# Patient Record
Sex: Male | Born: 1945 | Race: White | Hispanic: No | Marital: Married | Smoking: Former smoker
Health system: Southern US, Community
[De-identification: ages and names within clinical notes are randomized; demographics above are authoritative.]

## PROBLEM LIST (undated history)

## (undated) DIAGNOSIS — E785 Hyperlipidemia, unspecified: Secondary | ICD-10-CM

## (undated) DIAGNOSIS — F4310 Post-traumatic stress disorder, unspecified: Secondary | ICD-10-CM

## (undated) DIAGNOSIS — F419 Anxiety disorder, unspecified: Secondary | ICD-10-CM

## (undated) DIAGNOSIS — R7303 Prediabetes: Secondary | ICD-10-CM

## (undated) DIAGNOSIS — G4730 Sleep apnea, unspecified: Secondary | ICD-10-CM

## (undated) DIAGNOSIS — D126 Benign neoplasm of colon, unspecified: Secondary | ICD-10-CM

## (undated) DIAGNOSIS — F32A Depression, unspecified: Secondary | ICD-10-CM

## (undated) DIAGNOSIS — I10 Essential (primary) hypertension: Secondary | ICD-10-CM

## (undated) DIAGNOSIS — M1990 Unspecified osteoarthritis, unspecified site: Secondary | ICD-10-CM

## (undated) DIAGNOSIS — J449 Chronic obstructive pulmonary disease, unspecified: Secondary | ICD-10-CM

## (undated) DIAGNOSIS — E559 Vitamin D deficiency, unspecified: Secondary | ICD-10-CM

## (undated) DIAGNOSIS — G250 Essential tremor: Secondary | ICD-10-CM

## (undated) DIAGNOSIS — C61 Malignant neoplasm of prostate: Secondary | ICD-10-CM

## (undated) DIAGNOSIS — R519 Headache, unspecified: Secondary | ICD-10-CM

## (undated) DIAGNOSIS — K219 Gastro-esophageal reflux disease without esophagitis: Secondary | ICD-10-CM

## (undated) DIAGNOSIS — Z85828 Personal history of other malignant neoplasm of skin: Secondary | ICD-10-CM

## (undated) HISTORY — PX: EYE SURGERY: SHX253

## (undated) HISTORY — DX: Benign neoplasm of colon, unspecified: D12.6

## (undated) HISTORY — PX: COLONOSCOPY W/ POLYPECTOMY: SHX1380

## (undated) HISTORY — DX: Gastro-esophageal reflux disease without esophagitis: K21.9

## (undated) HISTORY — DX: Essential (primary) hypertension: I10

## (undated) HISTORY — DX: Hyperlipidemia, unspecified: E78.5

## (undated) HISTORY — DX: Vitamin D deficiency, unspecified: E55.9

## (undated) HISTORY — DX: Prediabetes: R73.03

---

## 2006-12-20 ENCOUNTER — Emergency Department (HOSPITAL_COMMUNITY): Admission: EM | Admit: 2006-12-20 | Discharge: 2006-12-21 | Payer: Self-pay | Admitting: Emergency Medicine

## 2010-02-07 DIAGNOSIS — Z85828 Personal history of other malignant neoplasm of skin: Secondary | ICD-10-CM

## 2010-02-07 HISTORY — DX: Personal history of other malignant neoplasm of skin: Z85.828

## 2011-10-26 ENCOUNTER — Encounter: Payer: Self-pay | Admitting: Gastroenterology

## 2011-11-18 ENCOUNTER — Ambulatory Visit (AMBULATORY_SURGERY_CENTER): Payer: Medicare Other | Admitting: *Deleted

## 2011-11-18 VITALS — Ht 72.0 in | Wt 195.0 lb

## 2011-11-18 DIAGNOSIS — Z1211 Encounter for screening for malignant neoplasm of colon: Secondary | ICD-10-CM

## 2011-11-18 MED ORDER — SUPREP BOWEL PREP KIT 17.5-3.13-1.6 GM/177ML PO SOLN: ORAL | Status: DC

## 2011-11-21 ENCOUNTER — Other Ambulatory Visit: Payer: Self-pay | Admitting: Urology

## 2011-12-02 ENCOUNTER — Encounter: Payer: Self-pay | Admitting: Gastroenterology

## 2011-12-02 ENCOUNTER — Ambulatory Visit (AMBULATORY_SURGERY_CENTER): Payer: Medicare Other | Admitting: Gastroenterology

## 2011-12-02 VITALS — BP 130/74 | HR 62 | Temp 95.9°F | Resp 35 | Ht 72.0 in | Wt 195.0 lb

## 2011-12-02 DIAGNOSIS — Z1211 Encounter for screening for malignant neoplasm of colon: Secondary | ICD-10-CM

## 2011-12-02 DIAGNOSIS — Z86010 Personal history of colonic polyps: Secondary | ICD-10-CM

## 2011-12-02 DIAGNOSIS — D126 Benign neoplasm of colon, unspecified: Secondary | ICD-10-CM

## 2011-12-02 MED ORDER — SODIUM CHLORIDE 0.9 % IV SOLN: 500.0000 mL | INTRAVENOUS | Status: DC

## 2011-12-02 NOTE — Patient Instructions (Signed)
YOU HAD AN ENDOSCOPIC PROCEDURE TODAY AT THE [facility]: Refer to the procedure report that was given to you for any specific questions about what was found during the examination.  If the procedure report does not answer your questions, please call your gastroenterologist to clarify.  If you requested that your care partner not be given the details of your procedure findings, then the procedure report has been included in a sealed envelope for you to review at your convenience later.  YOU SHOULD EXPECT: Some feelings of bloating in the abdomen. Passage of more gas than usual.  Walking can help get rid of the air that was put into your GI tract during the procedure and reduce the bloating. If you had a lower endoscopy (such as a colonoscopy or flexible sigmoidoscopy) you may notice spotting of blood in your stool or on the toilet paper. If you underwent a bowel prep for your procedure, then you may not have a normal bowel movement for a few days.  DIET: Your first meal following the procedure should be a light meal and then it is ok to progress to your normal diet.  A half-sandwich or bowl of soup is an example of a good first meal.  Heavy or fried foods are harder to digest and may make you feel nauseous or bloated.  Likewise meals heavy in dairy and vegetables can cause extra gas to form and this can also increase the bloating.  Drink plenty of fluids but you should avoid alcoholic beverages for 24 hours.  ACTIVITY: Your care partner should take you home directly after the procedure.  You should plan to take it easy, moving slowly for the rest of the day.  You can resume normal activity the day after the procedure however you should NOT DRIVE or use heavy machinery for 24 hours (because of the sedation medicines used during the test).    SYMPTOMS TO REPORT IMMEDIATELY: A gastroenterologist can be reached at any hour.  During normal business hours, 8:30 AM to 5:00 PM Monday through Friday,  call [phone].  After hours and on weekends, please call the GI answering service at [phone] who will take a message and have the physician on call contact you.   Following lower endoscopy (colonoscopy or flexible sigmoidoscopy):  Excessive amounts of blood in the stool  Significant tenderness or worsening of abdominal pains  Swelling of the abdomen that is new, acute  Fever of 100F or higher    FOLLOW UP: If any biopsies were taken you will be contacted by phone or by letter within the next 1-3 weeks.  Call your gastroenterologist if you have not heard about the biopsies in 3 weeks.  Our staff will call the home number listed on your records the next business day following your procedure to check on you and address any questions or concerns that you may have at that time regarding the information given to you following your procedure. This is a courtesy call and so if there is no answer at the home number and we have not heard from you through the emergency physician on call, we will assume that you have returned to your regular daily activities without incident.  SIGNATURES/CONFIDENTIALITY: You and/or your care partner have signed paperwork which will be entered into your electronic medical record.  These signatures attest to the fact that that the information above on your After Visit Summary has been reviewed and is understood.  Full responsibility of the confidentiality   of this discharge information lies with you and/or your care-partner.   INFORMATION ON POLYPS,DIVERTICULOSIS,& HEMORRHOIDS GIVEN TO YOU TODAY   

## 2011-12-02 NOTE — Op Note (Signed)
[facility] [address] [place], [address]   COLONOSCOPY PROCEDURE REPORT  PATIENT: Juan, Torres  MR#: [id number] BIRTHDATE: Jun 13, 1945 , 66  yrs. old GENDER: Male ENDOSCOPIST: Louis Meckel, MD REFERRED VH:QIONGEX Oneta Rack, M.D. PROCEDURE DATE:  12/02/2011 PROCEDURE:   Colonoscopy with cold biopsy polypectomy ASA CLASS:   Class II INDICATIONS:adenomatous polyp 2003. MEDICATIONS: MAC sedation, administered by CRNA and propofol (Diprivan) 300mg  IV  DESCRIPTION OF PROCEDURE:   After the risks benefits and alternatives of the procedure were thoroughly explained, informed consent was obtained.  A digital rectal exam revealed external hemorrhoids.   The LB CF-[id number]  endoscope was introduced through the anus and advanced to the cecum, which was identified by both the appendix and ileocecal valve. No adverse events experienced.   The quality of the prep was Suprep excellent  The instrument was then slowly withdrawn as the colon was fully examined.      COLON FINDINGS: Three sessile polyps measuring 2 mm in size were found in the ascending colon.  A polypectomy was performed with cold forceps.  The resection was complete and the polyp tissue was completely retrieved.   A sessile polyp measuring 2 mm in size was found in the descending colon.  A polypectomy was performed with cold forceps.   Mild diverticulosis was noted in the sigmoid colon. The colon mucosa was otherwise normal.  Retroflexed views revealed no abnormalities. The time to cecum=2 minutes 42 seconds. Withdrawal time=13 minutes 15 seconds.  The scope was withdrawn and the procedure completed. COMPLICATIONS: There were no complications.  ENDOSCOPIC IMPRESSION: 1.   Three sessile polyps measuring 2 mm in size were found in the ascending colon; polypectomy was performed with cold forceps 2.   Sessile polyp measuring 2 mm in size was found in the descending colon; polypectomy was performed  with cold forceps 3.   Mild diverticulosis was noted in the sigmoid colon 4.   The colon mucosa was otherwise normal  RECOMMENDATIONS: If the polyp(s) removed today are proven to be adenomatous (pre-cancerous) polyps, you will need a repeat colonoscopy in 5 years.  Otherwise you should continue to follow colorectal cancer screening guidelines for "routine risk" patients with colonoscopy in 10 years.  You will receive a letter within 1-2 weeks with the results of your biopsy as well as final recommendations.  Please call my office if you have not received a letter after 3 weeks.   eSigned:  Louis Meckel, MD 12/02/2011 10:12 AM   cc:

## 2011-12-05 ENCOUNTER — Telehealth: Payer: Self-pay

## 2011-12-05 NOTE — Telephone Encounter (Signed)
Call patient identified self stated I am not interested and hung up the phone.

## 2011-12-08 ENCOUNTER — Encounter: Payer: Self-pay | Admitting: Gastroenterology

## 2011-12-14 ENCOUNTER — Encounter (HOSPITAL_BASED_OUTPATIENT_CLINIC_OR_DEPARTMENT_OTHER): Payer: Self-pay | Admitting: *Deleted

## 2011-12-14 NOTE — Progress Notes (Signed)
NPO AFTER MN WITH EXCEPTION WATER/ GATORADE UNTIL 0700. ARRIVES AT 1200. NEEDS ISTAT AND EKG. WILL DO FLEET ENEMA AM OF SURG W/ SIP OF WATER.

## 2011-12-18 NOTE — H&P (Signed)
Urology History and Physical Exam  CC: Elevated PSA. Prostate nodule  HPI: 66 year old male presents today for transrectal prostate ultrasound, bilateral prostatic nerve block and prostate biopsy for an elevated PSA and prostate nodule. His PSA was 4.63 (19% free) on 10/20/11. This gives him a risk of prostate cancer of 24.9%.  This has not been associated with gross hematuria. A prostate nodule was discovered in October 2013. It is at the right apex of the prostate. It is approximately 0.7 cm in size. It felt confined to the prostate.    We discussed management options. We have discussed prostate biopsy in the OR along with the risks, benefits, alternatives, and likelihood of achieving goals. He has elected to do this in the OR as opposed to the clinic as he did not tolerate rectal examination well in the clinic. He was given an Rx for levaquin to take before this biopsy and he will receive rocephin today IV. UA 11/17/11 was negative for signs of infection.  PMH: Past Medical History  Diagnosis Date  . GERD (gastroesophageal reflux disease)   . Hypertension   . Hyperlipidemia   . Adenomatous colon polyp   . Prostate nodule   . Elevated PSA   . History of basal cell cancer 2012    PSH: History reviewed. No pertinent past surgical history.  Allergies: Allergies  Allergen Reactions  . Penicillins Rash    Medications: No prescriptions prior to admission     Social History: History   Social History  . Marital Status: Married    Spouse Name: N/A    Number of Children: N/A  . Years of Education: N/A   Occupational History  . Not on file.   Social History Main Topics  . Smoking status: Former Smoker -- 1.0 packs/day for 20 years    Types: Cigarettes  . Smokeless tobacco: Never Used  . Alcohol Use: 21.6 oz/week    36 Cans of beer per week  . Drug Use: 6 per week    Special: Marijuana  . Sexually Active: Not on file   Other Topics Concern  . Not on file   Social  History Narrative  . No narrative on file    Family History: Family History  Problem Relation Age of Onset  . Breast cancer Mother     Review of Systems: Positive: None. Negative: Fever, SOB, or chest pain.  A further 10 point review of systems was negative except what is listed in the HPI.  Physical Exam:  General: No acute distress.  Awake. Head:  Normocephalic.  Atraumatic. ENT:  EOMI.  Mucous membranes moist Neck:  Supple.  No lymphadenopathy. CV:  S1 present. S2 present. Regular rate. Pulmonary: Equal effort bilaterally.  Clear to auscultation bilaterally. Abdomen: Soft.  Non- tender to palpation. Skin:  Normal turgor.  No visible rash. Extremity: No gross deformity of bilateral upper extremities.  No gross deformity of    bilateral lower extremities. Neurologic: Alert. Appropriate mood.  .  Studies:  No results found for this basename: HGB:2,WBC:2,PLT:2 in the last 72 hours  No results found for this basename: NA:2,K:2,CL:2,CO2:2,BUN:2,CREATININE:2,CALCIUM:2,MAGNESIUM:2,GFRNONAA:2,GFRAA:2 in the last 72 hours   No results found for this basename: PT:2,INR:2,APTT:2 in the last 72 hours   No components found with this basename: ABG:2    Assessment:  Elevated PSA. Prostate nodule.  Plan: To the OR for transrectal prostate ultrasound, bilateral prostatic nerve block and prostate biopsy.

## 2011-12-19 ENCOUNTER — Other Ambulatory Visit: Payer: Self-pay

## 2011-12-19 ENCOUNTER — Ambulatory Visit (HOSPITAL_BASED_OUTPATIENT_CLINIC_OR_DEPARTMENT_OTHER): Payer: Medicare Other | Admitting: Anesthesiology

## 2011-12-19 ENCOUNTER — Encounter (HOSPITAL_BASED_OUTPATIENT_CLINIC_OR_DEPARTMENT_OTHER)
Admission: RE | Disposition: A | Discharge status: Home / Self Care | Payer: Self-pay | Source: Ambulatory Visit | Attending: Urology

## 2011-12-19 ENCOUNTER — Encounter (HOSPITAL_BASED_OUTPATIENT_CLINIC_OR_DEPARTMENT_OTHER): Payer: Self-pay | Admitting: Anesthesiology

## 2011-12-19 ENCOUNTER — Encounter (HOSPITAL_BASED_OUTPATIENT_CLINIC_OR_DEPARTMENT_OTHER): Payer: Self-pay | Admitting: *Deleted

## 2011-12-19 ENCOUNTER — Ambulatory Visit (HOSPITAL_BASED_OUTPATIENT_CLINIC_OR_DEPARTMENT_OTHER)
Admission: RE | Admit: 2011-12-19 | Discharge: 2011-12-19 | Disposition: A | Discharge status: Home / Self Care | Payer: Medicare Other | Source: Ambulatory Visit | Attending: Urology | Admitting: Urology

## 2011-12-19 DIAGNOSIS — E785 Hyperlipidemia, unspecified: Secondary | ICD-10-CM | POA: Insufficient documentation

## 2011-12-19 DIAGNOSIS — I10 Essential (primary) hypertension: Secondary | ICD-10-CM | POA: Insufficient documentation

## 2011-12-19 DIAGNOSIS — R972 Elevated prostate specific antigen [PSA]: Secondary | ICD-10-CM

## 2011-12-19 DIAGNOSIS — C61 Malignant neoplasm of prostate: Secondary | ICD-10-CM | POA: Insufficient documentation

## 2011-12-19 DIAGNOSIS — Z8546 Personal history of malignant neoplasm of prostate: Secondary | ICD-10-CM | POA: Insufficient documentation

## 2011-12-19 DIAGNOSIS — K219 Gastro-esophageal reflux disease without esophagitis: Secondary | ICD-10-CM | POA: Insufficient documentation

## 2011-12-19 HISTORY — DX: Personal history of other malignant neoplasm of skin: Z85.828

## 2011-12-19 HISTORY — PX: PROSTATE BIOPSY: SHX241

## 2011-12-19 LAB — POCT I-STAT 4, (NA,K, GLUC, HGB,HCT)
Glucose, Bld: 94 mg/dL (ref 70–99)
HCT: 47 % (ref 39.0–52.0)
Hemoglobin: 16 g/dL (ref 13.0–17.0)
Sodium: 140 mEq/L (ref 135–145)

## 2011-12-19 SURGERY — BIOPSY, PROSTATE, RECTAL APPROACH, WITH US GUIDANCE
Anesthesia: General | Site: Prostate | Wound class: Clean Contaminated

## 2011-12-19 MED ORDER — KETOROLAC TROMETHAMINE 30 MG/ML IJ SOLN
INTRAMUSCULAR | Status: DC | PRN
  Administered 2011-12-19: 30 mg via INTRAVENOUS

## 2011-12-19 MED ORDER — FENTANYL CITRATE 0.05 MG/ML IJ SOLN
INTRAMUSCULAR | Status: DC | PRN
  Administered 2011-12-19 (×2): 25 ug via INTRAVENOUS
  Administered 2011-12-19: 50 ug via INTRAVENOUS

## 2011-12-19 MED ORDER — PROPOFOL 10 MG/ML IV BOLUS
INTRAVENOUS | Status: DC | PRN
  Administered 2011-12-19 (×2): 20 mg via INTRAVENOUS

## 2011-12-19 MED ORDER — MIDAZOLAM HCL 5 MG/5ML IJ SOLN
INTRAMUSCULAR | Status: DC | PRN
  Administered 2011-12-19 (×4): 1 mg via INTRAVENOUS
  Administered 2011-12-19: 2 mg via INTRAVENOUS

## 2011-12-19 MED ORDER — HYDROCODONE-ACETAMINOPHEN 5-325 MG PO TABS: 1.0000 | ORAL_TABLET | ORAL | Status: DC | PRN

## 2011-12-19 MED ORDER — DEXTROSE 5 % IV SOLN
1.0000 g | Freq: Once | INTRAVENOUS | Status: AC
  Administered 2011-12-19: 2 g via INTRAVENOUS
  Filled 2011-12-19: qty 10

## 2011-12-19 MED ORDER — LIDOCAINE HCL (CARDIAC) 20 MG/ML IV SOLN
INTRAVENOUS | Status: DC | PRN
  Administered 2011-12-19: 40 mg via INTRAVENOUS

## 2011-12-19 MED ORDER — FENTANYL CITRATE 0.05 MG/ML IJ SOLN
25.0000 ug | INTRAMUSCULAR | Status: DC | PRN
  Filled 2011-12-19: qty 1

## 2011-12-19 MED ORDER — LACTATED RINGERS IV SOLN
INTRAVENOUS | Status: DC
  Administered 2011-12-19: 13:00:00 via INTRAVENOUS
  Filled 2011-12-19: qty 1000

## 2011-12-19 MED ORDER — BUPIVACAINE HCL (PF) 0.25 % IJ SOLN
INTRAMUSCULAR | Status: DC | PRN
  Administered 2011-12-19: 10 mL

## 2011-12-19 MED ORDER — LACTATED RINGERS IV SOLN
INTRAVENOUS | Status: DC | PRN
  Administered 2011-12-19: 13:00:00 via INTRAVENOUS

## 2011-12-19 MED ORDER — PROPOFOL 10 MG/ML IV EMUL
INTRAVENOUS | Status: DC | PRN
  Administered 2011-12-19: 120 ug/kg/min via INTRAVENOUS

## 2011-12-19 MED ORDER — SENNOSIDES-DOCUSATE SODIUM 8.6-50 MG PO TABS: 1.0000 | ORAL_TABLET | Freq: Two times a day (BID) | ORAL | Status: DC

## 2011-12-19 MED ORDER — LACTATED RINGERS IV SOLN
INTRAVENOUS | Status: DC
  Filled 2011-12-19: qty 1000

## 2011-12-19 MED ORDER — ONDANSETRON HCL 4 MG/2ML IJ SOLN
INTRAMUSCULAR | Status: DC | PRN
  Administered 2011-12-19: 4 mg via INTRAVENOUS

## 2011-12-19 SURGICAL SUPPLY — 11 items
DRESSING TELFA 8X3 (GAUZE/BANDAGES/DRESSINGS) ×2 IMPLANT
GLOVE ECLIPSE 7.0 STRL STRAW (GLOVE) IMPLANT
GLOVE INDICATOR 7.5 STRL GRN (GLOVE) IMPLANT
IV NS IRRIG 3000ML ARTHROMATIC (IV SOLUTION) IMPLANT
NEEDLE SPNL 22GX7 QUINCKE BK (NEEDLE) ×2 IMPLANT
SURGILUBE 2OZ TUBE FLIPTOP (MISCELLANEOUS) ×2 IMPLANT
SYR CONTROL 10ML LL (SYRINGE) ×2 IMPLANT
TOWEL OR 17X24 6PK STRL BLUE (TOWEL DISPOSABLE) ×2 IMPLANT
UNDERPAD 30X30 INCONTINENT (UNDERPADS AND DIAPERS) ×2 IMPLANT
WATER STERILE IRR 3000ML UROMA (IV SOLUTION) IMPLANT
WATER STERILE IRR 500ML POUR (IV SOLUTION) ×2 IMPLANT

## 2011-12-19 NOTE — Transfer of Care (Signed)
Immediate Anesthesia Transfer of Care Note  Patient: Juan Torres  Procedure(s) Performed: Procedure(s) (LRB): BIOPSY TRANSRECTAL ULTRASONIC PROSTATE (TUBP) (N/A)  Patient Location: PACU  Anesthesia Type: General  Level of Consciousness: awake, sedated, patient cooperative and responds to stimulation  Airway & Oxygen Therapy: Patient Spontanous Breathing and Patient connected to face mask oxygen  Post-op Assessment: Report given to PACU RN, Post -op Vital signs reviewed and stable and Patient moving all extremities  Post vital signs: Reviewed and stable  Complications: No apparent anesthesia complications

## 2011-12-19 NOTE — Progress Notes (Signed)
B/P 81/38. Asymtomatic hypotensive. Skin w/d. Arousable. Increased IV fluid rate & pt placed supine.

## 2011-12-19 NOTE — Op Note (Signed)
DATE OF PROCEDURE: 12/19/11    OPERATIVE REPORT   SURGEON: Natalia Leatherwood, MD  ASSISTANT: None.   PREOPERATIVE DIAGNOSIS: Elevated PSA. Prostate nodule. POSTOPERATIVE DIAGNOSIS: Elevated PSA. No prostate nodule noted.   PROCEDURE PERFORMED:  1. Prostate biopsy.  2. Transrectal ultrasound imaging with interpretation.  3. Bilateral prostatic nerve block.   ANESTHETIC: Monitored anesthesia care and local nerve block.   LOCAL MEDICATION: 0.25% Marcaine without epinephrine, 10 mL.  DRAINS: None.  COMPLICATIONS: None.   FINDINGS: Negative prostate nodule on DRE. Prostate measurement: Volume: 42 cc.  Length: 4.37 cm. Height: 3.44 cm. Width: 5.37 cm.  There were calcifications in the bilateral mid and base of the prostate. No hypoechoic areas.  Normal seminal vesicles.  Median lobe present: Negative   COMPLICATIONS: None.   HISTORY OF PRESENT ILLNESS:  66 year old male presents for prostate biopsy for elevated PSA and prostate nodule. He took his levaquin for 2 days pre-op and received rocephin 2g IV today. Because of some lightheadedness with the levaquin, I instructed him that the rocephin should cover him and he should not have to take his final dose of levaquin tomorrow. I also advised him to resume his aspirin in 7 days unless he still has a lot of gross hematuria.  PROCEDURE: After informed consent was obtained, the patient was taken  to the operating room where he was placed in the supine position.  Monitored anesthesia care was induced and he was placed on his side.  SCDs were in place and turned on before induction of the monitored  anesthesia care. IV antibiotics were infused. A time-out was then  performed in which the correct patient, surgical site, and procedure  were identified and agreed upon by the team.   I then performed a digital rectal examination and estimated his prostate to be 40 g in size. There were no nodules or induration noted where I had previously  felt a nodule. This could have been due to the patient not tolerating his DRE well in the clinic.  Next, the rectal ultrasound probe was advanced through the anus into the rectum. The  prostate was well visualized. I scanned the entire prostate and  performed measurements (results listed above).  Next, I performed a prostatic nerve block bilaterally by visualizing the base of the prostate with the junction of the seminal  vesicle on the right side and injecting 4 mL of 0.25% Marcaine without  epinephrine. Next, I advanced the probe to the apex of the right side  and injected 1 mL. Attention was turned to the left side at the junction of the base of the prostate and the seminal vesicle, and 4 mL were  injected here as well as 1 mL at the left apex giving a total of 10 mL  of injection. Each time before injecting, it did pull back on the  syringe to ensure I was not injecting into the vasculature.   Following this, the standard sextant biopsies were carried out by obtaining six  biopsies on each side at the lateral apex, midportion and base as well  as the medial apex, midportion, and base, bilaterally.    After this was done, there was noted to be no rectal bleeding or any other  complications. The probe was removed. This completed the procedure.  He was placed in back in a supine position. Anesthesia was reversed.  He was taken to the PACU in a stable condition.

## 2011-12-19 NOTE — Anesthesia Postprocedure Evaluation (Signed)
Anesthesia Post Note  Patient: Juan Torres  Procedure(s) Performed: Procedure(s) (LRB): BIOPSY TRANSRECTAL ULTRASONIC PROSTATE (TUBP) (N/A)  Anesthesia type: MAC  Patient location: PACU  Post pain: Pain level controlled  Post assessment: Post-op Vital signs reviewed  Last Vitals:  Filed Vitals:   12/19/11 1430  BP: 113/78  Pulse: 65  Temp:   Resp: 9    Post vital signs: Reviewed  Level of consciousness: sedated  Complications: No apparent anesthesia complications

## 2011-12-19 NOTE — Progress Notes (Signed)
Dr Bubba Camp w on telephone  surgery results. No new orders noted.

## 2011-12-19 NOTE — Anesthesia Preprocedure Evaluation (Addendum)
Anesthesia Evaluation  Patient identified by MRN, date of birth, ID band Patient awake    Reviewed: Allergy & Precautions, H&P , NPO status , Patient's Chart, lab work & pertinent test results  Airway Mallampati: II TM Distance: >3 FB Neck ROM: full    Dental No notable dental hx. (+) Teeth Intact and Dental Advisory Given   Pulmonary neg pulmonary ROS,  breath sounds clear to auscultation  Pulmonary exam normal       Cardiovascular Exercise Tolerance: Good hypertension, Pt. on medications negative cardio ROS  Rhythm:regular Rate:Normal     Neuro/Psych negative neurological ROS  negative psych ROS   GI/Hepatic negative GI ROS, Neg liver ROS, GERD-  Medicated and Controlled,  Endo/Other  negative endocrine ROS  Renal/GU negative Renal ROS  negative genitourinary   Musculoskeletal   Abdominal   Peds  Hematology negative hematology ROS (+)   Anesthesia Other Findings   Reproductive/Obstetrics negative OB ROS                          Anesthesia Physical Anesthesia Plan  ASA: II  Anesthesia Plan: MAC   Post-op Pain Management:    Induction:   Airway Management Planned:   Additional Equipment:   Intra-op Plan:   Post-operative Plan:   Informed Consent: I have reviewed the patients History and Physical, chart, labs and discussed the procedure including the risks, benefits and alternatives for the proposed anesthesia with the patient or authorized representative who has indicated his/her understanding and acceptance.   Dental Advisory Given  Plan Discussed with: CRNA and Surgeon  Anesthesia Plan Comments:        Anesthesia Quick Evaluation

## 2011-12-20 ENCOUNTER — Encounter (HOSPITAL_BASED_OUTPATIENT_CLINIC_OR_DEPARTMENT_OTHER): Payer: Self-pay | Admitting: Urology

## 2011-12-22 ENCOUNTER — Encounter (HOSPITAL_BASED_OUTPATIENT_CLINIC_OR_DEPARTMENT_OTHER): Payer: Self-pay

## 2012-01-25 ENCOUNTER — Encounter: Payer: Self-pay | Admitting: Radiation Oncology

## 2012-01-25 NOTE — Progress Notes (Signed)
New Consult Prostate Cancer Biopsy 12/19/11  Adenocarcinoma,gleason 3+3=6,Volume=42cc,PSA=4.63 PSA 01/07/04=1.64 PSA 12/25/02=1.26    Married, 2 sons, wife RN at [facility]gent [facility], alert,oriented x3   Mother deceased breast cancer Maternal grandfather prostate cancer   Allergies:PCN

## 2012-01-26 ENCOUNTER — Encounter: Payer: Self-pay | Admitting: Radiation Oncology

## 2012-01-26 ENCOUNTER — Ambulatory Visit
Admission: RE | Admit: 2012-01-26 | Discharge: 2012-01-26 | Disposition: A | Discharge status: Home / Self Care | Payer: Medicare Other | Source: Ambulatory Visit | Attending: Radiation Oncology | Admitting: Radiation Oncology

## 2012-01-26 VITALS — BP 147/79 | HR 70 | Temp 97.7°F | Resp 20 | Ht 72.0 in | Wt 200.9 lb

## 2012-01-26 DIAGNOSIS — I10 Essential (primary) hypertension: Secondary | ICD-10-CM | POA: Insufficient documentation

## 2012-01-26 DIAGNOSIS — C61 Malignant neoplasm of prostate: Secondary | ICD-10-CM

## 2012-01-26 DIAGNOSIS — Z79899 Other long term (current) drug therapy: Secondary | ICD-10-CM | POA: Insufficient documentation

## 2012-01-26 DIAGNOSIS — Z8042 Family history of malignant neoplasm of prostate: Secondary | ICD-10-CM | POA: Insufficient documentation

## 2012-01-26 DIAGNOSIS — E785 Hyperlipidemia, unspecified: Secondary | ICD-10-CM | POA: Insufficient documentation

## 2012-01-26 DIAGNOSIS — Z87891 Personal history of nicotine dependence: Secondary | ICD-10-CM | POA: Insufficient documentation

## 2012-01-26 DIAGNOSIS — Z803 Family history of malignant neoplasm of breast: Secondary | ICD-10-CM | POA: Insufficient documentation

## 2012-01-26 DIAGNOSIS — K219 Gastro-esophageal reflux disease without esophagitis: Secondary | ICD-10-CM | POA: Insufficient documentation

## 2012-01-26 HISTORY — DX: Malignant neoplasm of prostate: C61

## 2012-01-26 NOTE — Progress Notes (Signed)
Addendum, Prostate Cancer Biopsy 12/19/11, Adenocarcinoma,gleason=3+3=6, Volume=42cc,PSA=4.63 PSA 01/07/04=1.64,  PSA 12/25/02=1.26 Married 2 sons, wife Charity fundraiser at [facility]gent [facility], 2 sons, alert,oriented x3, no c/o pain, dysuria, regular bowel movemnets Mother deceased breast cancer,\Maternal grandfather prostate cancer  Allergies:PCN=hives

## 2012-01-26 NOTE — Progress Notes (Signed)
Please see the Nurse Progress Note in the MD Initial Consult Encounter for this patient. 

## 2012-01-26 NOTE — Progress Notes (Signed)
[facility] Radiation Oncology NEW PATIENT EVALUATION  Name: Juan Torres MRN: [id number]  Date:   01/26/2012           DOB: 1945-06-09  Status: outpatient   CC: Nadean Corwin, MD  Milford Cage,*    REFERRING PHYSICIAN: Milford Cage,*   DIAGNOSIS: Stage TI C. favorable risk adenocarcinoma prostate   HISTORY OF PRESENT ILLNESS:  Juan Torres is a 66 y.o. male who is seen today for the courtesy of Dr. Margarita Grizzle for discussion of possible radiation therapy in the management of his stage TI C. favorable risk adenocarcinoma prostate. He was noted to have arise in his PSA from 1.64 2005 up to 4.63 on 10/25/2011. He was referred by Oneta Rack to Dr. Margarita Grizzle for further evaluation. He underwent ultrasound-guided biopsies on 12/19/2011 of finding Gleason 6 (3+3) involving 50% of one core from the right lateral base, 10% of one core from the left lateral apex and 50% of one core from the left medial apex. His gland volume was approximately 42 cc. He is doing well from a GU and GI standpoint. His I PSS score is 3. He is potent. He does not have any chronic rectal issues, but he is "ultrasensitive" to digital rectal examinations. A recent colonoscopy revealed a few small polyps which were benign.  PREVIOUS RADIATION THERAPY: No   PAST MEDICAL HISTORY:  has a past medical history of GERD (gastroesophageal reflux disease); Hypertension; Hyperlipidemia; Adenomatous colon polyp; Prostate nodule; Elevated PSA; History of basal cell cancer (2012); Allergy; and Prostate cancer (12/19/11).     PAST SURGICAL HISTORY:  Past Surgical History  Procedure Date  . Prostate biopsy 12/19/2011    Procedure: BIOPSY TRANSRECTAL ULTRASONIC PROSTATE (TUBP);  Surgeon: Milford Cage, MD;  Location: [facility];  Service: Urology;  Laterality: N/A;  45 MIN PROSTATE NERVE BLOCK OFFICE TECH WILL ASSIST  [employer] MEDICARE  . Colonoscopy w/ polypectomy      Adenomatous     FAMILY HISTORY: family history includes Breast cancer in his mother and Cancer in his cousin, maternal grandfather, and maternal uncle. His father died from cardiac disease and Alzheimer's disease at age 67. His mother died from metastatic breast cancer 63. A maternal uncle had prostate cancer and died of metastatic disease in his 31s. A maternal first cousin was diagnosed with prostate cancer in his early 79s.   SOCIAL HISTORY:  reports that he has quit smoking. His smoking use included Cigarettes. He has a 20 pack-year smoking history. He has never used smokeless tobacco. He reports that he drinks about 21.6 ounces of alcohol per week. He reports that he uses illicit drugs (Marijuana) about 6 times per week. [place] veteran with exposure to agent orange. Retired Radiation protection practitioner. Married, 2 children. His wife is a Counselling psychologist.   ALLERGIES: Penicillins   MEDICATIONS:  Current Outpatient Prescriptions  Medication Sig Dispense Refill  . amLODipine-olmesartan (AZOR) 5-40 MG per tablet Take 1 tablet by mouth daily.      Marland Kitchen aspirin 81 MG tablet Take 81 mg by mouth daily.      . Cholecalciferol (VITAMIN D) 2000 UNITS CAPS Take 2 capsules by mouth daily.      . FENOFIBRATE PO Take 1 capsule by mouth daily.      . Magnesium 400 MG CAPS Take 2 capsules by mouth daily.      Marland Kitchen omeprazole (PRILOSEC) 20 MG capsule Take 20 mg by mouth daily.      Marland Kitchen  B Complex Vitamins (VITAMIN B-COMPLEX PO) Take 1 tablet by mouth daily.      . rosuvastatin (CRESTOR) 20 MG tablet Take 20 mg by mouth daily.         REVIEW OF SYSTEMS:  Pertinent items are noted in HPI.    PHYSICAL EXAM:  height is 6' (1.829 m) and weight is 200 lb 14.4 oz (91.128 kg). His temperature is 97.7 F (36.5 C). His blood pressure is 147/79 and his pulse is 70. His respiration is 20.   Alert and oriented. Head and neck examination: Grossly unremarkable. There are significant actinic changes. Nodes without palpable cervical or  supraclavicular lymphadenopathy. Chest: Lungs clear. Back: Without spinal or CVA tenderness. Heart: Regular in rhythm. Abdomen: Without masses organomegaly. Genitalia: Unremarkable to inspection. Rectal examination not performed (declined by the patient secondary to rectal discomfort). Extremities: Without edema. Neurologic examination: Grossly nonfocal.   LABORATORY DATA:  Lab Results  Component Value Date   HGB 16.0 12/19/2011   HCT 47.0 12/19/2011   Lab Results  Component Value Date   NA 140 12/19/2011   K 4.5 12/19/2011   No results found for this basename: ALT, AST, GGT, ALKPHOS, BILITOT   PSA 4.63 from 10/20/2011.   IMPRESSION: Stage TI C. favorable risk adenocarcinoma prostate. I explained to the patient and his wife that his prognosis is related to his stage, Gleason score, and PSA level. All are favorable. We discussed surgery versus close surveillance versus radiation therapy. Radiation therapy options include seed implantation alone or 8 weeks of external beam/IMRT. Considering his relatively good health and age, I would certainly offer him potentially curative therapy. We discussed the potential acute and late toxicities of radiation therapy. Literature was given to the patient. His prognosis is excellent. If he wants to proceed with seed implantation we would need to perform a CT arch study prior to scheduling a seed implant. He understands that if he wants external beam/IMRT we will need to have placement of 3 gold seed markers for prostate imaging guidance.   PLAN: As discussed above. The patient will contact me after the holidays if he wants to consider radiation therapy. He'll contact Dr. Margarita Grizzle if he chooses surgery.   I spent 60 minutes minutes face to face with the patient and more than 50% of that time was spent in counseling and/or coordination of care.

## 2012-01-27 NOTE — Addendum Note (Signed)
Encounter addended by: Juni Glaab Mintz Daiden Coltrane, RN on: 01/27/2012  6:08 PM<BR>     Documentation filed: Charges VN

## 2012-02-09 ENCOUNTER — Encounter: Payer: Self-pay | Admitting: Radiation Oncology

## 2012-02-09 NOTE — Progress Notes (Signed)
Chart note: The patient called me today if he wants to proceed with seed implantation. I will go ahead and get him set up for his CT arch study and then seed implantation with Dr. Natalia Leatherwood.

## 2012-02-10 ENCOUNTER — Telehealth: Payer: Self-pay | Admitting: *Deleted

## 2012-02-10 NOTE — Telephone Encounter (Signed)
Called patient to inform of appts. On 02-16-12- 8:30 am for CT Arch Study and visits with the nurse and doctor, spoke wiith patient and he is aware of these appts.

## 2012-02-14 ENCOUNTER — Other Ambulatory Visit: Payer: Self-pay | Admitting: Urology

## 2012-02-16 ENCOUNTER — Ambulatory Visit
Admission: RE | Admit: 2012-02-16 | Discharge: 2012-02-16 | Disposition: A | Discharge status: Home / Self Care | Payer: Medicare Other | Source: Ambulatory Visit | Attending: Radiation Oncology | Admitting: Radiation Oncology

## 2012-02-16 ENCOUNTER — Ambulatory Visit (HOSPITAL_COMMUNITY)
Admission: RE | Admit: 2012-02-16 | Discharge: 2012-02-16 | Disposition: A | Discharge status: Home / Self Care | Payer: Medicare Other | Source: Ambulatory Visit | Attending: Urology | Admitting: Urology

## 2012-02-16 ENCOUNTER — Ambulatory Visit: Admission: RE | Admit: 2012-02-16 | Payer: Medicare Other | Source: Ambulatory Visit | Admitting: Radiation Oncology

## 2012-02-16 ENCOUNTER — Ambulatory Visit: Payer: Medicare Other

## 2012-02-16 ENCOUNTER — Encounter: Payer: Self-pay | Admitting: Radiation Oncology

## 2012-02-16 VITALS — BP 133/78 | HR 70 | Temp 97.8°F | Resp 20 | Ht 72.0 in | Wt 200.3 lb

## 2012-02-16 DIAGNOSIS — C61 Malignant neoplasm of prostate: Secondary | ICD-10-CM | POA: Insufficient documentation

## 2012-02-16 DIAGNOSIS — Z01818 Encounter for other preprocedural examination: Secondary | ICD-10-CM | POA: Insufficient documentation

## 2012-02-16 DIAGNOSIS — R300 Dysuria: Secondary | ICD-10-CM | POA: Insufficient documentation

## 2012-02-16 DIAGNOSIS — I10 Essential (primary) hypertension: Secondary | ICD-10-CM | POA: Insufficient documentation

## 2012-02-16 DIAGNOSIS — R351 Nocturia: Secondary | ICD-10-CM | POA: Insufficient documentation

## 2012-02-16 NOTE — Progress Notes (Signed)
   CT simulation/treatment planning note: Juan Torres underwent CT simulation/treatment planning in preparation of his prostate seed implant. He was taken to the CT simulator. His pelvis was scanned. I contoured his prostate. The prostate contours projected over the bony arch. The arch is open. His prostate volume is 36.2 cc with a prosthetic length of 3.6 cm. He is a candidate for seed implantation. I prescribing 14,500 cGy utilizing I-125 seeds. He will be implanted with Nucletron seed Selectron system.

## 2012-02-16 NOTE — Progress Notes (Signed)
Patient here followup new consult Prostate Cancer here for pre-seed consult Alert,oriented x3, no c/o pain, no dysuria, HOH left hearing aids home, meds updated, regular bowel movements 8:38 AM

## 2012-02-16 NOTE — Progress Notes (Signed)
Please see the Nurse Progress Note in the MD Initial Consult Encounter for this patient. 

## 2012-02-16 NOTE — Progress Notes (Signed)
Followup note: The patient returns today for review along with his CT arch study. His prostate volume is 36.2 cc with a prostatic length of 3.6 cm. His arch is open. I reviewed his CT arch images with the patient. No new GU or GI difficulties. I reviewed the potential acute and late toxicities of seed implantation. Consent is signed.  10 minutes spent face-to-face with the patient.

## 2012-03-27 ENCOUNTER — Telehealth: Payer: Self-pay | Admitting: *Deleted

## 2012-03-27 NOTE — Telephone Encounter (Signed)
Called patient to remind of appt., spoke with patient and he is aware of appt.

## 2012-03-28 LAB — COMPREHENSIVE METABOLIC PANEL
AST: 22 U/L (ref 0–37)
Albumin: 3.9 g/dL (ref 3.5–5.2)
Chloride: 103 mEq/L (ref 96–112)
Creatinine, Ser: 1.69 mg/dL — ABNORMAL HIGH (ref 0.50–1.35)
Total Bilirubin: 0.5 mg/dL (ref 0.3–1.2)

## 2012-03-28 LAB — PROTIME-INR
INR: 0.97 (ref 0.00–1.49)
Prothrombin Time: 12.8 seconds (ref 11.6–15.2)

## 2012-03-28 LAB — CBC
MCV: 95.6 fL (ref 78.0–100.0)
Platelets: 244 10*3/uL (ref 150–400)
RDW: 12.6 % (ref 11.5–15.5)
WBC: 7.2 10*3/uL (ref 4.0–10.5)

## 2012-03-29 ENCOUNTER — Encounter (HOSPITAL_BASED_OUTPATIENT_CLINIC_OR_DEPARTMENT_OTHER): Payer: Self-pay | Admitting: *Deleted

## 2012-03-30 ENCOUNTER — Encounter (HOSPITAL_BASED_OUTPATIENT_CLINIC_OR_DEPARTMENT_OTHER): Payer: Self-pay | Admitting: *Deleted

## 2012-03-30 NOTE — Progress Notes (Signed)
Pt instructed npo p mn 2/25 x prilosec w sip of water.  Labs, cxr, ekg  In epic.  To [facility] 2/26 @ 1200.  Pt to do fleets enema am of surgery.

## 2012-04-03 ENCOUNTER — Telehealth: Payer: Self-pay | Admitting: *Deleted

## 2012-04-03 NOTE — Telephone Encounter (Signed)
CALLED PATIENT TO REMIND OF PROCEDURE FOR 04-04-12, SPOKE WITH PATIENT AND HE IS AWARE OF THIS PROCEDURE.

## 2012-04-04 ENCOUNTER — Encounter (HOSPITAL_BASED_OUTPATIENT_CLINIC_OR_DEPARTMENT_OTHER): Payer: Self-pay | Admitting: Anesthesiology

## 2012-04-04 ENCOUNTER — Encounter (HOSPITAL_BASED_OUTPATIENT_CLINIC_OR_DEPARTMENT_OTHER): Payer: Self-pay | Admitting: *Deleted

## 2012-04-04 ENCOUNTER — Ambulatory Visit (HOSPITAL_BASED_OUTPATIENT_CLINIC_OR_DEPARTMENT_OTHER): Payer: Medicare Other | Admitting: Anesthesiology

## 2012-04-04 ENCOUNTER — Ambulatory Visit (HOSPITAL_COMMUNITY): Payer: Medicare Other

## 2012-04-04 ENCOUNTER — Encounter (HOSPITAL_BASED_OUTPATIENT_CLINIC_OR_DEPARTMENT_OTHER)
Admission: RE | Disposition: A | Discharge status: Home / Self Care | Payer: Self-pay | Source: Ambulatory Visit | Attending: Urology

## 2012-04-04 ENCOUNTER — Ambulatory Visit (HOSPITAL_BASED_OUTPATIENT_CLINIC_OR_DEPARTMENT_OTHER)
Admission: RE | Admit: 2012-04-04 | Discharge: 2012-04-04 | Disposition: A | Discharge status: Home / Self Care | Payer: Medicare Other | Source: Ambulatory Visit | Attending: Urology | Admitting: Urology

## 2012-04-04 DIAGNOSIS — E785 Hyperlipidemia, unspecified: Secondary | ICD-10-CM | POA: Insufficient documentation

## 2012-04-04 DIAGNOSIS — Z85828 Personal history of other malignant neoplasm of skin: Secondary | ICD-10-CM | POA: Insufficient documentation

## 2012-04-04 DIAGNOSIS — C61 Malignant neoplasm of prostate: Secondary | ICD-10-CM | POA: Insufficient documentation

## 2012-04-04 DIAGNOSIS — I10 Essential (primary) hypertension: Secondary | ICD-10-CM | POA: Insufficient documentation

## 2012-04-04 DIAGNOSIS — K219 Gastro-esophageal reflux disease without esophagitis: Secondary | ICD-10-CM | POA: Insufficient documentation

## 2012-04-04 HISTORY — PX: RADIOACTIVE SEED IMPLANT: SHX5150

## 2012-04-04 HISTORY — PX: CYSTOSCOPY: SHX5120

## 2012-04-04 SURGERY — INSERTION, RADIATION SOURCE, PROSTATE
Anesthesia: General | Site: Prostate | Wound class: Clean Contaminated

## 2012-04-04 MED ORDER — LACTATED RINGERS IV SOLN
INTRAVENOUS | Status: DC | PRN
  Administered 2012-04-04 (×2): via INTRAVENOUS

## 2012-04-04 MED ORDER — MIDAZOLAM HCL 5 MG/5ML IJ SOLN
INTRAMUSCULAR | Status: DC | PRN
  Administered 2012-04-04 (×4): 1 mg via INTRAVENOUS

## 2012-04-04 MED ORDER — FENTANYL CITRATE 0.05 MG/ML IJ SOLN
INTRAMUSCULAR | Status: DC | PRN
Start: 2012-04-04 — End: 2012-04-04
  Administered 2012-04-04: 12.5 ug via INTRAVENOUS
  Administered 2012-04-04: 25 ug via INTRAVENOUS
  Administered 2012-04-04: 50 ug via INTRAVENOUS
  Administered 2012-04-04: 12.5 ug via INTRAVENOUS
  Administered 2012-04-04: 25 ug via INTRAVENOUS
  Administered 2012-04-04: 12.5 ug via INTRAVENOUS
  Administered 2012-04-04: 25 ug via INTRAVENOUS
  Administered 2012-04-04 (×3): 12.5 ug via INTRAVENOUS

## 2012-04-04 MED ORDER — TAMSULOSIN HCL 0.4 MG PO CAPS: 0.8000 mg | ORAL_CAPSULE | Freq: Every day | ORAL | Status: DC

## 2012-04-04 MED ORDER — ONDANSETRON HCL 4 MG/2ML IJ SOLN
INTRAMUSCULAR | Status: DC | PRN
  Administered 2012-04-04: 4 mg via INTRAVENOUS

## 2012-04-04 MED ORDER — KETOROLAC TROMETHAMINE 30 MG/ML IJ SOLN
15.0000 mg | Freq: Once | INTRAMUSCULAR | Status: DC | PRN
  Filled 2012-04-04: qty 1

## 2012-04-04 MED ORDER — FENTANYL CITRATE 0.05 MG/ML IJ SOLN
25.0000 ug | INTRAMUSCULAR | Status: DC | PRN
  Administered 2012-04-04 (×2): 25 ug via INTRAVENOUS
  Filled 2012-04-04: qty 1

## 2012-04-04 MED ORDER — CIPROFLOXACIN IN D5W 400 MG/200ML IV SOLN
400.0000 mg | INTRAVENOUS | Status: AC
  Administered 2012-04-04: 400 mg via INTRAVENOUS
  Filled 2012-04-04: qty 200

## 2012-04-04 MED ORDER — LACTATED RINGERS IV SOLN
INTRAVENOUS | Status: DC
  Administered 2012-04-04: 13:00:00 via INTRAVENOUS
  Filled 2012-04-04: qty 1000

## 2012-04-04 MED ORDER — STERILE WATER FOR IRRIGATION IR SOLN
Status: DC | PRN
  Administered 2012-04-04: 3000 mL

## 2012-04-04 MED ORDER — KETOROLAC TROMETHAMINE 30 MG/ML IJ SOLN
INTRAMUSCULAR | Status: DC | PRN
  Administered 2012-04-04: 30 mg via INTRAVENOUS

## 2012-04-04 MED ORDER — SENNOSIDES-DOCUSATE SODIUM 8.6-50 MG PO TABS: 1.0000 | ORAL_TABLET | Freq: Two times a day (BID) | ORAL | Status: DC

## 2012-04-04 MED ORDER — HYDROCODONE-ACETAMINOPHEN 5-325 MG PO TABS: 1.0000 | ORAL_TABLET | ORAL | Status: DC | PRN

## 2012-04-04 MED ORDER — FLEET ENEMA 7-19 GM/118ML RE ENEM
1.0000 | ENEMA | Freq: Once | RECTAL | Status: DC
  Filled 2012-04-04: qty 1

## 2012-04-04 MED ORDER — PHENAZOPYRIDINE HCL 100 MG PO TABS: 100.0000 mg | ORAL_TABLET | Freq: Three times a day (TID) | ORAL | Status: DC | PRN

## 2012-04-04 MED ORDER — IOHEXOL 350 MG/ML SOLN
INTRAVENOUS | Status: DC | PRN
  Administered 2012-04-04: 3 mL

## 2012-04-04 MED ORDER — DEXAMETHASONE SODIUM PHOSPHATE 4 MG/ML IJ SOLN
INTRAMUSCULAR | Status: DC | PRN
  Administered 2012-04-04: 10 mg via INTRAVENOUS

## 2012-04-04 MED ORDER — PROMETHAZINE HCL 25 MG/ML IJ SOLN
6.2500 mg | INTRAMUSCULAR | Status: DC | PRN
  Filled 2012-04-04: qty 1

## 2012-04-04 MED ORDER — AMLODIPINE BESYLATE 5 MG PO TABS
5.0000 mg | ORAL_TABLET | Freq: Every day | ORAL | Status: DC
  Administered 2012-04-04: 5 mg via ORAL
  Filled 2012-04-04: qty 1

## 2012-04-04 MED ORDER — PROPOFOL 10 MG/ML IV BOLUS
INTRAVENOUS | Status: DC | PRN
  Administered 2012-04-04: 250 mg via INTRAVENOUS
  Administered 2012-04-04: 50 mg via INTRAVENOUS

## 2012-04-04 MED ORDER — CIPROFLOXACIN HCL 500 MG PO TABS: 500.0000 mg | ORAL_TABLET | Freq: Two times a day (BID) | ORAL | Status: DC

## 2012-04-04 MED ORDER — LIDOCAINE HCL (CARDIAC) 20 MG/ML IV SOLN
INTRAVENOUS | Status: DC | PRN
  Administered 2012-04-04: 100 mg via INTRAVENOUS

## 2012-04-04 MED ORDER — BELLADONNA ALKALOIDS-OPIUM 16.2-60 MG RE SUPP
RECTAL | Status: DC | PRN
  Administered 2012-04-04: 1 via RECTAL

## 2012-04-04 SURGICAL SUPPLY — 30 items
BAG DRAIN URO-CYSTO SKYTR STRL (DRAIN) ×3 IMPLANT
BAG URINE DRAINAGE (UROLOGICAL SUPPLIES) ×3 IMPLANT
BLADE SURG ROTATE 9660 (MISCELLANEOUS) ×3 IMPLANT
CANISTER SUCT LVC 12 LTR MEDI- (MISCELLANEOUS) IMPLANT
CATH FOLEY 2WAY SLVR  5CC 16FR (CATHETERS) ×2
CATH FOLEY 2WAY SLVR 5CC 16FR (CATHETERS) ×4 IMPLANT
CATH ROBINSON RED A/P 20FR (CATHETERS) ×3 IMPLANT
CLOTH BEACON ORANGE TIMEOUT ST (SAFETY) ×3 IMPLANT
COVER MAYO STAND STRL (DRAPES) ×3 IMPLANT
COVER TABLE BACK 60X90 (DRAPES) ×3 IMPLANT
DRAPE CAMERA CLOSED 9X96 (DRAPES) ×3 IMPLANT
DRSG TEGADERM 4X4.75 (GAUZE/BANDAGES/DRESSINGS) ×3 IMPLANT
DRSG TEGADERM 8X12 (GAUZE/BANDAGES/DRESSINGS) ×3 IMPLANT
ELECT REM PT RETURN 9FT ADLT (ELECTROSURGICAL) ×3
ELECTRODE REM PT RTRN 9FT ADLT (ELECTROSURGICAL) ×2 IMPLANT
GAUZE SPONGE 4X4 12PLY STRL LF (GAUZE/BANDAGES/DRESSINGS) ×3 IMPLANT
GLOVE BIO SURGEON STRL SZ7 (GLOVE) ×3 IMPLANT
GLOVE BIO SURGEON STRL SZ7.5 (GLOVE) ×6 IMPLANT
GLOVE ECLIPSE 7.0 STRL STRAW (GLOVE) ×3 IMPLANT
GLOVE INDICATOR 7.5 STRL GRN (GLOVE) ×3 IMPLANT
GOWN STRL REIN XL XLG (GOWN DISPOSABLE) ×3 IMPLANT
HOLDER FOLEY CATH W/STRAP (MISCELLANEOUS) ×3 IMPLANT
NEEDLE HYPO 22GX1.5 SAFETY (NEEDLE) IMPLANT
NS IRRIG 500ML POUR BTL (IV SOLUTION) IMPLANT
PACK CYSTOSCOPY (CUSTOM PROCEDURE TRAY) ×3 IMPLANT
SYRINGE 10CC LL (SYRINGE) ×3 IMPLANT
Selectseed ×3 IMPLANT
UNDERPAD 30X30 INCONTINENT (UNDERPADS AND DIAPERS) ×6 IMPLANT
WATER STERILE IRR 3000ML UROMA (IV SOLUTION) ×3 IMPLANT
WATER STERILE IRR 500ML POUR (IV SOLUTION) ×3 IMPLANT

## 2012-04-04 NOTE — Anesthesia Postprocedure Evaluation (Signed)
Anesthesia Post Note  Patient: Juan Torres  Procedure(s) Performed: Procedure(s) (LRB): RADIOACTIVE SEED IMPLANT (N/A) CYSTOSCOPY (N/A)  Anesthesia type: General  Patient location: PACU  Post pain: Pain level controlled  Post assessment: Post-op Vital signs reviewed  Last Vitals: BP 130/85  Pulse 65  Temp(Src) 36.4 C (Oral)  Resp 14  Ht 6' (1.829 m)  Wt 194 lb 5 oz (88.14 kg)  BMI 26.35 kg/m2  SpO2 98%  Post vital signs: Reviewed  Level of consciousness: sedated  Complications: No apparent anesthesia complications

## 2012-04-04 NOTE — Op Note (Signed)
Date of Procedure: 04/04/12       Operative Report  Surgeon: Natalia Leatherwood, MD  Assistant: Chipper Herb, MD with Radiation Oncology.  Preoperative Diagnosis: Prostate cancer  Postoperative Diagnosis: Prostate cancer  Procedure Performed: Placement of radioactive seed implantation into the prostate. Cystoscopy  Estimated Blood Loss: Minimal  Drains:  Foley catheter.  Specimen: None  Complications: None  Findings: No spacers or seeds in the urethra or bladder. Clear efflux from ureters bilaterally.  History of Present Illness: This is a pleasant 67 year old male with prostate cancer. He was counseled regarding his treatment options and has elected for brachytherapy. He presents today for that procedure.  Procedure: I was called to the operating room once the patient had already been positioned in the dorsal lithotomy position under the direction of Dr. Kathrynn Running. His genitals and perineum had been prepped and draped in the usual sterile fashion. General anesthesia had already been induced.  The rectal probe along with the frame and the prostate seed placement device as well as the anchoring needles had already been placed. A separate timeout was performed in which the surgical procedure, the patient, and location were all identified and agreed upon by the team. Following this, there was placement under ultrasound guidance of brachytherapy seeds transperineally. A total of 24 activated needles were used. The total number of seeds implanted was 72. Seed type was Iodine-125.   After placement of all the seeds and fluoroscopy of the patient's pelvis, flexible cystoscopy was performed. There were no seeds or spacers in the urethra or bladder.  Clear efflux from the ureters bilaterally.  After this, the cystoscope was removed and a Foley catheter was placed with 10 cc of sterile water in the balloon. This completed the procedure. The patient was placed back in a supine position, and  anesthesia reversed. He was taken to the PACU in stable condition.  All counts were correct at the end of the case.

## 2012-04-04 NOTE — Transfer of Care (Signed)
Immediate Anesthesia Transfer of Care Note  Patient: Juan Torres  Procedure(s) Performed: Procedure(s) (LRB): RADIOACTIVE SEED IMPLANT (N/A) CYSTOSCOPY (N/A)  Patient Location: PACU  Anesthesia Type: General  Level of Consciousness: awake, sedated, patient cooperative and responds to stimulation  Airway & Oxygen Therapy: Patient Spontanous Breathing and Patient connected to face mask oxygen  Post-op Assessment: Report given to PACU RN, Post -op Vital signs reviewed and stable and Patient moving all extremities  Post vital signs: Reviewed and stable  Complications: No apparent anesthesia complications

## 2012-04-04 NOTE — Anesthesia Procedure Notes (Signed)
Procedure Name: LMA Insertion Date/Time: 04/04/2012 1:39 PM Performed by: Jessica Priest Pre-anesthesia Checklist: Patient identified, Emergency Drugs available, Suction available and Patient being monitored Patient Re-evaluated:Patient Re-evaluated prior to inductionOxygen Delivery Method: Circle System Utilized Preoxygenation: Pre-oxygenation with 100% oxygen Intubation Type: IV induction Ventilation: Mask ventilation without difficulty LMA: LMA inserted LMA Size: 4.0 Number of attempts: 1 Airway Equipment and Method: bite block Placement Confirmation: positive ETCO2 Tube secured with: Tape Dental Injury: Teeth and Oropharynx as per pre-operative assessment

## 2012-04-04 NOTE — Anesthesia Preprocedure Evaluation (Addendum)
Anesthesia Evaluation  Patient identified by MRN, date of birth, ID band Patient awake    Reviewed: Allergy & Precautions, H&P , NPO status , Patient's Chart, lab work & pertinent test results  Airway Mallampati: II TM Distance: >3 FB Neck ROM: Full    Dental no notable dental hx. (+) Dental Advisory Given   Pulmonary neg pulmonary ROS,  breath sounds clear to auscultation  Pulmonary exam normal       Cardiovascular hypertension, Pt. on medications Rhythm:Regular Rate:Normal     Neuro/Psych negative neurological ROS  negative psych ROS   GI/Hepatic Neg liver ROS, GERD-  Medicated,  Endo/Other  negative endocrine ROS  Renal/GU negative Renal ROS     Musculoskeletal negative musculoskeletal ROS (+)   Abdominal   Peds  Hematology negative hematology ROS (+)   Anesthesia Other Findings   Reproductive/Obstetrics                         Anesthesia Physical Anesthesia Plan  ASA: II  Anesthesia Plan: General   Post-op Pain Management:    Induction: Intravenous  Airway Management Planned: LMA  Additional Equipment:   Intra-op Plan:   Post-operative Plan: Extubation in OR  Informed Consent: I have reviewed the patients History and Physical, chart, labs and discussed the procedure including the risks, benefits and alternatives for the proposed anesthesia with the patient or authorized representative who has indicated his/her understanding and acceptance.   Dental advisory given  Plan Discussed with: CRNA and Surgeon  Anesthesia Plan Comments:        Anesthesia Quick Evaluation

## 2012-04-04 NOTE — H&P (Signed)
Urology History and Physical Exam  CC: Prostate cancer  HPI: 67 year old male presents today with a history of prostate cancer. This was diagnosed based on elevated PSA of 4.6. Biopsy in November 2013 revealed Gleason score 3+3 equals 6 involving 3 total course. Is not associated with dysuria. We have discussed his management options extensively and he has also been counseled by Dr. Dayton Scrape with radiation oncology. After discussing the risks, benefits, alternatives, and likelihood of achieving goals he presents today for transperineal placement of radioactive seeds into the prostate, transrectal ultrasound, fluoroscopy, and cystoscopy. UA from 03/27/12 was negative for signs of infection. I reviewed with the patient and his wife the expected course of surgery and the expected postoperative course. I reviewed the expected postoperative medications, postoperative activities, and instructions.  PMH: Past Medical History  Diagnosis Date  . GERD (gastroesophageal reflux disease)   . Hypertension   . Hyperlipidemia   . Adenomatous colon polyp   . History of basal cell cancer 2012  . Prostate cancer DX 12/19/11    bx=Adenocarcinoma,gleason=3+3=6,volume=42cc,PSA=4.63    PSH: Past Surgical History  Procedure Laterality Date  . Prostate biopsy  12/19/2011    Procedure: BIOPSY TRANSRECTAL ULTRASONIC PROSTATE (TUBP);  Surgeon: Milford Cage, MD;  Location: [facility];  Service: Urology;  Laterality: N/A;    . Colonoscopy w/ polypectomy      Adenomatous    Allergies: Allergies  Allergen Reactions  . Penicillins Rash    Medications: No prescriptions prior to admission     Social History: History   Social History  . Marital Status: Married    Spouse Name: N/A    Number of Children: 2  . Years of Education: N/A   Occupational History  . Not on file.   Social History Main Topics  . Smoking status: Former Smoker -- 1.00 packs/day for 20 years    Types:  Cigarettes    Quit date: 03/30/2010  . Smokeless tobacco: Never Used  . Alcohol Use: 21.6 oz/week    36 Cans of beer per week     Comment: 4-6 beers daily  . Drug Use: 6.00 per week    Special: Marijuana     Comment: / marijuana  . Sexually Active: Not on file   Other Topics Concern  . Not on file   Social History Narrative  . No narrative on file    Family History: Family History  Problem Relation Age of Onset  . Breast cancer Mother   . Cancer Maternal Grandfather     colon  . Cancer Maternal Uncle     prostate  . Cancer Cousin     prostyae cancer 1st maternal     Review of Systems: Positive: None Negative: Chest pain, SOB, or fever.  A further 10 point review of systems was negative except what is listed in the HPI.  Physical Exam: Filed Vitals:   04/04/12 1214  BP: 168/91  Pulse: 68  Temp: 97 F (36.1 C)  Resp: 20    General: No acute distress.  Awake. Head:  Normocephalic.  Atraumatic. ENT:  EOMI.  Mucous membranes moist Neck:  Supple.  No lymphadenopathy. CV:  S1 present. S2 present. Regular rate. Pulmonary: Equal effort bilaterally.  Clear to auscultation bilaterally. Abdomen: Soft.  Non- tender to palpation. Skin:  Normal turgor.  No visible rash. Extremity: No gross deformity of bilateral upper extremities.  No gross deformity of    bilateral lower extremities. Neurologic: Alert. Appropriate mood.  Studies:  No results found for this basename: HGB, WBC, PLT,  in the last 72 hours  No results found for this basename: NA, K, CL, CO2, BUN, CREATININE, CALCIUM, MAGNESIUM, GFRNONAA, GFRAA,  in the last 72 hours   No results found for this basename: PT, INR, APTT,  in the last 72 hours   No components found with this basename: ABG,     Assessment:  Prostate cancer  Plan: Proceed to the operating room for transperineal placement of breaking therapy seeds, transrectal ultrasound, fluoroscopy, and cystoscopy.

## 2012-04-05 ENCOUNTER — Encounter: Payer: Self-pay | Admitting: Radiation Oncology

## 2012-04-05 ENCOUNTER — Encounter (HOSPITAL_BASED_OUTPATIENT_CLINIC_OR_DEPARTMENT_OTHER): Payer: Self-pay | Admitting: Urology

## 2012-04-05 NOTE — Progress Notes (Signed)
CC Dr. Natalia Leatherwood, Dr. Lucky Cowboy  End of treatment summary:  Diagnosis: Stage TI C. favorable risk adenocarcinoma prostate  Requesting physician: Dr. Natalia Leatherwood  Intent: Curative  Date of prostate seed implant: 04/04/2038  Site/dose: Prostate 14,500 cGy  Isotope: I-125 utilizing 24 active needles to implant 72 seeds with an individual seed activity of 0.43 mCi per seed for a total implant activity of 31.03 mCi.  Narrative: Mr. Ostermiller appears to have undergone a successful Nucletron seed Selectron implant with Dr. Margarita Grizzle.  Plan: Followup visit with both of [place] in approximately 3 weeks. We will obtain a CT scan at that time for his post implant dosimetry to assess the quality of his implant.

## 2012-04-05 NOTE — Progress Notes (Signed)
[facility] Radiation Oncology Brachytherapy Operative Procedure Note  Name: Juan Torres MRN: [id number]  Date:   02/14/2012           DOB: January 22, 1946  Status:outpatient    JX:BJYNWGN,FAOZHYQ DAVID, MD    DIAGNOSIS: A 67 year old gentlemen with stage T1 C. adenocarcinoma of the prostate with a Gleason of 6 and a PSA of 4.63.  PROCEDURE: Insertion of radioactive I-125 seeds into the prostate gland.  RADIATION DOSE: 145 Gy, definitive therapy.  TECHNIQUE: [name] was brought to the operating room with Dr. Margarita Grizzle. He was placed in the dorsolithotomy position. He was catheterized and a rectal tube was inserted. The perineum was shaved, prepped and draped. The ultrasound probe was then introduced into the rectum to see the prostate gland.  TREATMENT DEVICE: A needle grid was attached to the ultrasound probe stand and anchor needles were placed.  COMPLEX ISODOSE CALCULATION: The prostate was imaged in 3D using a sagittal sweep of the prostate probe. These images were transferred to the planning computer. There, the prostate, urethra and rectum were defined on each axial reconstructed image. Then, the software created an optimized plan and a few seed positions were adjusted. Then the accepted plan was uploaded to the seed Selectron afterloading unit.  SPECIAL TREATMENT PROCEDURE/SUPERVISION AND HANDLING: The Nucletron FIRST system was used to place the needles under sagittal guidance. A total of 24 needles were used to deposit 72 seeds in the prostate gland. The individual seed activity was 0.43 mCi for a total implant activity of 31.03 mCi.  COMPLEX SIMULATION: At the end of the procedure, an anterior radiograph of the pelvis was obtained to document seed positioning and count. Cystoscopy was performed to check the urethra and bladder.  MICRODOSIMETRY: At the end of the procedure, the patient was emitting 0.06 mrem/hr at 1 meter. Accordingly, he was considered safe for  hospital discharge.  PLAN: The patient will return to the radiation oncology clinic for post implant CT dosimetry in three weeks.

## 2012-04-09 ENCOUNTER — Telehealth: Payer: Self-pay | Admitting: *Deleted

## 2012-04-09 NOTE — Telephone Encounter (Signed)
CALLED PATIENT TO REMIND OF APPTS. FOR 04-10-12, SPOKE WITH PATIENT AND HE IS AWARE OF THESE APPTS.

## 2012-04-10 ENCOUNTER — Ambulatory Visit: Payer: Medicare Other | Admitting: Radiation Oncology

## 2012-04-27 ENCOUNTER — Telehealth: Payer: Self-pay | Admitting: *Deleted

## 2012-04-27 NOTE — Telephone Encounter (Signed)
CALLED PATIENT TO REMIND OF APPTS, FOR 04-30-12, SPOKE WITH PATIENT AND HE IS AWARE OF THESE APPTS.

## 2012-04-30 ENCOUNTER — Encounter: Payer: Self-pay | Admitting: Radiation Oncology

## 2012-04-30 ENCOUNTER — Ambulatory Visit
Admission: RE | Admit: 2012-04-30 | Discharge: 2012-04-30 | Disposition: A | Discharge status: Home / Self Care | Payer: Medicare Other | Source: Ambulatory Visit | Attending: Radiation Oncology | Admitting: Radiation Oncology

## 2012-04-30 VITALS — BP 107/75 | HR 76 | Temp 98.0°F

## 2012-04-30 DIAGNOSIS — C61 Malignant neoplasm of prostate: Secondary | ICD-10-CM

## 2012-04-30 NOTE — Progress Notes (Signed)
Followup note:  Diagnosis: Stage TI C. favorable risk adenocarcinoma prostate  Juan Torres returns today approximately 4 weeks following his prostate seed implant with Dr. Margarita Grizzle in the management of his stage TI C. favorable risk adenocarcinoma prostate he saw Dr. Margarita Grizzle 2 weeks ago. He is doing well from a GU and GI standpoint although he does have nocturia x3-5. He does admit to mild dysuria. He is sexually active. He is also been on his motorcycle.  His CT scan today shows what appears to be an excellent seed distribution.  Physical examination: Wt Readings from Last 3 Encounters:  04/04/12 194 lb 5 oz (88.14 kg)  04/04/12 194 lb 5 oz (88.14 kg)  02/16/12 200 lb 4.8 oz (90.855 kg)   Temp Readings from Last 3 Encounters:  04/30/12 98 F (36.7 C)   04/04/12 96 F (35.6 C) Oral  04/04/12 96 F (35.6 C) Oral   BP Readings from Last 3 Encounters:  04/30/12 107/75  04/04/12 138/81  04/04/12 138/81   Pulse Readings from Last 3 Encounters:  04/30/12 76  04/04/12 73  04/04/12 73   Rectal examination not performed today.  Impression: Satisfactory progress. We'll move ahead with his post implant dosimetry and forward the results to Dr. Margarita Grizzle.  Plan: He'll see Dr. Margarita Grizzle in approximately 3-4 months for a PSA determination in followup visit. I've not scheduled Mr. Neenan for a formal followup visit and I ask that Dr. Margarita Grizzle keep me posted on his progress. He knows he can take ibuprofen when necessary if his dysuria worsens. He understands that he may be symptomatic for 3-4 months (equivalent to 2 half-lives of I-125).

## 2012-04-30 NOTE — Progress Notes (Signed)
Complex CT simulation note:  The patient was taken to the CT simulator. His pelvis was scanned. The CT data set was sent to the [facility] system for contouring of his prostate and rectum. He'll then undergo 3-D simulation to assess the quality of his implant. The seed distribution looks quite satisfactory.

## 2012-04-30 NOTE — Progress Notes (Signed)
Juan Torres here for follow up post seed placement.  He denies pain.  He does have some fatigue.  He does have some pressure when trying to urinate.  He does have urgency.  He usually gets up 3/5 times per night to urinate.  He denies diarrhea.

## 2012-05-03 ENCOUNTER — Encounter: Payer: Self-pay | Admitting: Radiation Oncology

## 2012-05-03 NOTE — Progress Notes (Signed)
CC: Dr. Natalia Leatherwood, Dr. Lucky Cowboy  3-D simulation/post implant CT dosimetry note: Juan Torres underwent post-implant CT dosimetry/3-D simulation on 05/02/2012 to assess the quality of his implant. His intraoperative prostate volume by ultrasound was 38.6 cc. His postoperative prostate volume by CT was 38.6 cc as well, an excellent correlation. Dose volume histograms were obtained for the prostate and rectum. His prostate D 90 is 114.9% and V100  95.7%, both excellent. 0 cc of rectum received the prescribed dose of 14,500 cGy. In summary, the patient has excellent post implant dosimetry with essentially 0 risk for late rectal toxicity. He is expected to do well.

## 2013-01-27 DIAGNOSIS — I10 Essential (primary) hypertension: Secondary | ICD-10-CM | POA: Insufficient documentation

## 2013-01-27 DIAGNOSIS — E559 Vitamin D deficiency, unspecified: Secondary | ICD-10-CM | POA: Insufficient documentation

## 2013-01-27 DIAGNOSIS — E782 Mixed hyperlipidemia: Secondary | ICD-10-CM | POA: Insufficient documentation

## 2013-01-27 DIAGNOSIS — K219 Gastro-esophageal reflux disease without esophagitis: Secondary | ICD-10-CM | POA: Insufficient documentation

## 2013-01-28 ENCOUNTER — Ambulatory Visit (INDEPENDENT_AMBULATORY_CARE_PROVIDER_SITE_OTHER): Payer: Medicare Other | Admitting: Physician Assistant

## 2013-01-28 ENCOUNTER — Encounter: Payer: Self-pay | Admitting: Physician Assistant

## 2013-01-28 VITALS — BP 122/78 | HR 76 | Temp 98.1°F | Resp 16 | Ht 72.0 in | Wt 193.0 lb

## 2013-01-28 DIAGNOSIS — I10 Essential (primary) hypertension: Secondary | ICD-10-CM

## 2013-01-28 DIAGNOSIS — R7309 Other abnormal glucose: Secondary | ICD-10-CM

## 2013-01-28 DIAGNOSIS — Z79899 Other long term (current) drug therapy: Secondary | ICD-10-CM

## 2013-01-28 DIAGNOSIS — R7303 Prediabetes: Secondary | ICD-10-CM

## 2013-01-28 DIAGNOSIS — E559 Vitamin D deficiency, unspecified: Secondary | ICD-10-CM

## 2013-01-28 DIAGNOSIS — E785 Hyperlipidemia, unspecified: Secondary | ICD-10-CM

## 2013-01-28 LAB — HEPATIC FUNCTION PANEL
AST: 31 U/L (ref 0–37)
Albumin: 4.5 g/dL (ref 3.5–5.2)
Alkaline Phosphatase: 38 U/L — ABNORMAL LOW (ref 39–117)
Total Protein: 6.7 g/dL (ref 6.0–8.3)

## 2013-01-28 LAB — BASIC METABOLIC PANEL WITH GFR
BUN: 19 mg/dL (ref 6–23)
CO2: 26 mEq/L (ref 19–32)
Chloride: 104 mEq/L (ref 96–112)
Creat: 1.76 mg/dL — ABNORMAL HIGH (ref 0.50–1.35)
Glucose, Bld: 98 mg/dL (ref 70–99)

## 2013-01-28 LAB — CBC WITH DIFFERENTIAL/PLATELET
Eosinophils Relative: 2 % (ref 0–5)
HCT: 46.9 % (ref 39.0–52.0)
Lymphocytes Relative: 22 % (ref 12–46)
Lymphs Abs: 1.3 10*3/uL (ref 0.7–4.0)
MCV: 99.2 fL (ref 78.0–100.0)
Monocytes Absolute: 0.7 10*3/uL (ref 0.1–1.0)
RBC: 4.73 MIL/uL (ref 4.22–5.81)
RDW: 13.5 % (ref 11.5–15.5)
WBC: 5.9 10*3/uL (ref 4.0–10.5)

## 2013-01-28 LAB — MAGNESIUM: Magnesium: 1.9 mg/dL (ref 1.5–2.5)

## 2013-01-28 LAB — LIPID PANEL
Cholesterol: 215 mg/dL — ABNORMAL HIGH (ref 0–200)
LDL Cholesterol: 118 mg/dL — ABNORMAL HIGH (ref 0–99)

## 2013-01-28 NOTE — Patient Instructions (Signed)
 Bad carbs also include fruit juice, alcohol, and sweet tea. These are empty calories that do not signal to your brain that you are full.   Please remember the good carbs are still carbs which convert into sugar. So please measure them out no more than 1/2-1 cup of rice, oatmeal, pasta, and beans.  Veggies are however free foods! Pile them on.   I like lean protein at every meal such as chicken, turkey, pork chops, cottage cheese, etc. Just do not fry these meats and please center your meal around vegetable, the meats should be a side dish.   No all fruit is created equal. Please see the list below, the fruit at the bottom is higher in sugars than the fruit at the top   Cholesterol Cholesterol is a white, waxy, fat-like protein needed by your body in small amounts. The liver makes all the cholesterol you need. It is carried from the liver by the blood through the blood vessels. Deposits (plaque) may build up on blood vessel walls. This makes the arteries narrower and stiffer. Plaque increases the risk for heart attack and stroke. You cannot feel your cholesterol level even if it is very high. The only way to know is by a blood test to check your lipid (fats) levels. Once you know your cholesterol levels, you should keep a record of the test results. Work with your caregiver to to keep your levels in the desired range. WHAT THE RESULTS MEAN:  Total cholesterol is a rough measure of all the cholesterol in your blood.  LDL is the so-called bad cholesterol. This is the type that deposits cholesterol in the walls of the arteries. You want this level to be low.  HDL is the good cholesterol because it cleans the arteries and carries the LDL away. You want this level to be high.  Triglycerides are fat that the body can either burn for energy or store. High levels are closely linked to heart disease. DESIRED LEVELS:  Total cholesterol below 200.  LDL below 100 for people at risk, below 70 for very  high risk.  HDL above 50 is good, above 60 is best.  Triglycerides below 150. HOW TO LOWER YOUR CHOLESTEROL:  Diet.  Choose fish or white meat chicken and turkey, roasted or baked. Limit fatty cuts of red meat, fried foods, and processed meats, such as sausage and lunch meat.  Eat lots of fresh fruits and vegetables. Choose whole grains, beans, pasta, potatoes and cereals.  Use only small amounts of olive, corn or canola oils. Avoid butter, mayonnaise, shortening or palm kernel oils. Avoid foods with trans-fats.  Use skim/nonfat milk and low-fat/nonfat yogurt and cheeses. Avoid whole milk, cream, ice cream, egg yolks and cheeses. Healthy desserts include angel food cake, ginger snaps, animal crackers, hard candy, popsicles, and low-fat/nonfat frozen yogurt. Avoid pastries, cakes, pies and cookies.  Exercise.  A regular program helps decrease LDL and raises HDL.  Helps with weight control.  Do things that increase your activity level like gardening, walking, or taking the stairs.  Medication.  May be prescribed by your caregiver to help lowering cholesterol and the risk for heart disease.  You may need medicine even if your levels are normal if you have several risk factors. HOME CARE INSTRUCTIONS   Follow your diet and exercise programs as suggested by your caregiver.  Take medications as directed.  Have blood work done when your caregiver feels it is necessary. MAKE SURE YOU:   Understand   these instructions.  Will watch your condition.  Will get help right away if you are not doing well or get worse. Document Released: 10/19/2000 Document Revised: 04/18/2011 Document Reviewed: 04/11/2007 [employer] Patient Information 2014 [employer].  

## 2013-01-28 NOTE — Progress Notes (Signed)
HPI Patient presents for 3 month follow up with hypertension, hyperlipidemia, prediabetes and vitamin D. Patient's blood pressure has been controlled at home, today their BP is BP: 122/78 mmHg  Patient denies chest pain, shortness of breath, dizziness.  Patient's cholesterol is diet controlled. In addition they are on crestor and denies myalgias. The cholesterol last visit was LDL 107, Trig 282. The patient has been working on diet and exercise for prediabetes, and denies changes in vision, polys, and paresthesias. A1C 5.4 He saw Dr. Margarita Grizzle last week and his PSA dropped further down after the prostate surgery and states he is having some ED.  Patient is on Vitamin D supplement.   Current Medications:  Current Outpatient Prescriptions on File Prior to Visit  Medication Sig Dispense Refill  . amLODipine-olmesartan (AZOR) 5-40 MG per tablet Take 1 tablet by mouth daily.      Marland Kitchen aspirin 81 MG tablet Take 81 mg by mouth daily.      . B Complex Vitamins (VITAMIN B-COMPLEX PO) Take 1 tablet by mouth daily.      . Cholecalciferol (VITAMIN D) 2000 UNITS CAPS Take 2 capsules by mouth daily.      . FENOFIBRATE PO Take 1 capsule by mouth daily.      . Magnesium 400 MG CAPS Take 2 capsules by mouth daily.      Marland Kitchen omeprazole (PRILOSEC) 20 MG capsule Take 20 mg by mouth daily.      . rosuvastatin (CRESTOR) 20 MG tablet Take 20 mg by mouth daily.       No current facility-administered medications on file prior to visit.   Medical History:  Past Medical History  Diagnosis Date  . Adenomatous colon polyp   . History of basal cell cancer 2012  . 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 GERD (gastroesophageal reflux disease)   . Hyperlipidemia   . Hypertension   . Prediabetes   . Vitamin D deficiency    Allergies:  Allergies  Allergen Reactions  . Lipitor [Atorvastatin]   . Niacin And Related     Flushing  . Penicillins Rash    ROS Constitutional:  Denies fever, chills, headaches, insomnia, fatigue, night sweats Eyes: Denies redness, blurred vision, diplopia, discharge, itchy, watery eyes.  ENT: Denies congestion, post nasal drip, sore throat, earache, dental pain, Tinnitus, Vertigo, Sinus pain, snoring.  Cardio: Denies chest pain, palpitations, irregular heartbeat, dyspnea, diaphoresis, orthopnea, PND, claudication, edema Respiratory: denies cough, shortness of breath, wheezing.  Gastrointestinal: Denies dysphagia, heartburn, AB pain/ cramps, N/V, diarrhea, constipation, hematemesis, melena, hematochezia,  hemorrhoids Genitourinary: + ED Denies dysuria, frequency, urgency, nocturia, hesitancy, discharge, hematuria, flank pain Musculoskeletal: Denies myalgia, stiffness, pain, swelling and strain/sprain. Skin: Denies pruritis, rash, changing in skin lesion Neuro: States he has been having minor tremors in his hands, worse in the morning better throughout th day. Denies Weakness, incoordination, spasms, pain Psychiatric: Denies confusion, memory loss, sensory loss Endocrine: Denies change in weight, skin, hair change, nocturia Diabetic Polys, Denies visual blurring, hyper /hypo glycemic episodes, and paresthesia, Heme/Lymph: Denies Excessive bleeding, bruising, enlarged lymph nodes  Family history- Review and unchanged Social history- Review and unchanged Physical Exam: Filed Vitals:   01/28/13 0857  BP: 122/78  Pulse: 76  Temp: 98.1 F (36.7 C)  Resp: 16   Filed Weights   01/28/13 0857  Weight: 193 lb (87.544 kg)   General Appearance: Well nourished, in no apparent distress. Eyes: PERRLA, EOMs, conjunctiva no swelling or erythema Sinuses: No Frontal/maxillary tenderness  ENT/Mouth: Ext aud canals clear, TMs without erythema, bulging. No erythema, swelling, or exudate on post pharynx.  Tonsils not swollen or erythematous. Hearing normal.  Neck: Supple, thyroid normal.  Respiratory: Respiratory effort normal, BS equal bilaterally  without rales, rhonchi, wheezing or stridor.  Cardio: RRR with no MRGs. Brisk peripheral pulses without edema.  Abdomen: Soft, + BS.  Non tender, no guarding, rebound, hernias, masses. Lymphatics: Non tender without lymphadenopathy.  Musculoskeletal: Full ROM, 5/5 strength, normal gait.  Skin: Warm, dry without rashes, lesions, ecchymosis.  Neuro: Cranial nerves intact. Normal muscle tone, no cerebellar symptoms. Sensation intact.  Psych: Awake and oriented X 3, normal affect, Insight and Judgment appropriate.   Assessment and Plan:  Hypertension: Continue medication, monitor blood pressure at home.  Continue DASH diet. Cholesterol: Continue diet and exercise. Check cholesterol.  Pre-diabetes-Continue diet and exercise. Check A1C Vitamin D Def- check level and continue medications.  ED s/p prostate surgery- Viagra samples given- pt understands if any CP go to ER.  Continue diet and meds as discussed. Further disposition pending results of labs.  Quentin Mulling 9:08 AM

## 2013-01-29 LAB — TSH: TSH: 2.792 u[IU]/mL (ref 0.350–4.500)

## 2013-02-20 ENCOUNTER — Encounter: Payer: Self-pay | Admitting: Physician Assistant

## 2013-02-20 ENCOUNTER — Ambulatory Visit (INDEPENDENT_AMBULATORY_CARE_PROVIDER_SITE_OTHER): Payer: Medicare Other | Admitting: Physician Assistant

## 2013-02-20 VITALS — BP 130/80 | HR 84 | Temp 97.9°F | Resp 16 | Ht 72.0 in | Wt 199.0 lb

## 2013-02-20 DIAGNOSIS — J069 Acute upper respiratory infection, unspecified: Secondary | ICD-10-CM

## 2013-02-20 DIAGNOSIS — N289 Disorder of kidney and ureter, unspecified: Secondary | ICD-10-CM

## 2013-02-20 LAB — CBC WITH DIFFERENTIAL/PLATELET
BASOS ABS: 0 10*3/uL (ref 0.0–0.1)
BASOS PCT: 0 % (ref 0–1)
EOS ABS: 0.1 10*3/uL (ref 0.0–0.7)
EOS PCT: 1 % (ref 0–5)
HCT: 43.8 % (ref 39.0–52.0)
Hemoglobin: 15.7 g/dL (ref 13.0–17.0)
Lymphocytes Relative: 9 % — ABNORMAL LOW (ref 12–46)
Lymphs Abs: 0.6 10*3/uL — ABNORMAL LOW (ref 0.7–4.0)
MCH: 34.7 pg — AB (ref 26.0–34.0)
MCHC: 35.8 g/dL (ref 30.0–36.0)
MCV: 96.7 fL (ref 78.0–100.0)
Monocytes Absolute: 0.7 10*3/uL (ref 0.1–1.0)
Monocytes Relative: 10 % (ref 3–12)
Neutro Abs: 5.8 10*3/uL (ref 1.7–7.7)
Neutrophils Relative %: 80 % — ABNORMAL HIGH (ref 43–77)
Platelets: 193 10*3/uL (ref 150–400)
RBC: 4.53 MIL/uL (ref 4.22–5.81)
RDW: 13.6 % (ref 11.5–15.5)
WBC: 7.3 10*3/uL (ref 4.0–10.5)

## 2013-02-20 LAB — BASIC METABOLIC PANEL WITH GFR
BUN: 19 mg/dL (ref 6–23)
CALCIUM: 9.5 mg/dL (ref 8.4–10.5)
CO2: 25 mEq/L (ref 19–32)
CREATININE: 1.64 mg/dL — AB (ref 0.50–1.35)
Chloride: 104 mEq/L (ref 96–112)
GFR, EST NON AFRICAN AMERICAN: 43 mL/min — AB
GFR, Est African American: 49 mL/min — ABNORMAL LOW
Glucose, Bld: 96 mg/dL (ref 70–99)
Potassium: 4 mEq/L (ref 3.5–5.3)
Sodium: 139 mEq/L (ref 135–145)

## 2013-02-20 LAB — POC INFLUENZA A&B (BINAX/QUICKVUE)

## 2013-02-20 LAB — MAGNESIUM: Magnesium: 1.4 mg/dL — ABNORMAL LOW (ref 1.5–2.5)

## 2013-02-20 MED ORDER — PREDNISONE 20 MG PO TABS: ORAL_TABLET | ORAL | Status: DC

## 2013-02-20 MED ORDER — AZITHROMYCIN 250 MG PO TABS: ORAL_TABLET | ORAL | Status: AC

## 2013-02-20 NOTE — Progress Notes (Signed)
   Subjective:    Patient ID: Juan Torres, male    DOB: 1945/03/16, 68 y.o.   MRN: [id number]  Influenza This is a new problem. The current episode started yesterday. The problem occurs constantly. The problem has been gradually worsening. Associated symptoms include arthralgias, chills, congestion, coughing, fatigue, headaches, myalgias and weakness. Pertinent negatives include no abdominal pain, anorexia, change in bowel habit, chest pain, diaphoresis, fever, joint swelling, nausea, neck pain, numbness, rash, sore throat, swollen glands, urinary symptoms, vertigo, visual change or vomiting. He has tried acetaminophen, NSAIDs, rest, sleep and drinking for the symptoms. The treatment provided no relief.    Review of Systems  Constitutional: Positive for chills and fatigue. Negative for fever and diaphoresis.  HENT: Positive for congestion, postnasal drip, rhinorrhea, sinus pressure and sneezing. Negative for sore throat and trouble swallowing.   Eyes: Negative.   Respiratory: Positive for cough. Negative for chest tightness, shortness of breath and wheezing.   Cardiovascular: Negative.  Negative for chest pain.  Gastrointestinal: Negative.  Negative for nausea, vomiting, abdominal pain, anorexia and change in bowel habit.  Genitourinary: Negative.   Musculoskeletal: Positive for arthralgias and myalgias. Negative for joint swelling and neck pain.  Skin: Negative for rash.  Neurological: Positive for weakness and headaches. Negative for vertigo and numbness.       Objective:   Physical Exam  Constitutional: He appears well-developed and well-nourished.  HENT:  Head: Normocephalic and atraumatic.  Right Ear: External ear normal.  Left Ear: External ear normal.  Nose: Right sinus exhibits maxillary sinus tenderness. Left sinus exhibits maxillary sinus tenderness.  Mouth/Throat: Oropharynx is clear and moist.  Eyes: Conjunctivae are normal. Pupils are equal, round, and reactive to light.   Neck: Normal range of motion. Neck supple.  Cardiovascular: Normal rate, regular rhythm and normal heart sounds.   Pulmonary/Chest: Effort normal and breath sounds normal. He has no wheezes.  Abdominal: Soft. Bowel sounds are normal.  Lymphadenopathy:    He has no cervical adenopathy.  Skin: Skin is warm and dry.      Assessment & Plan:  Upper respiratory infection- negative flu, treat conservative, zpak if needed. Prednisone and treat symptoms.   ARF- check BMP

## 2013-02-20 NOTE — Patient Instructions (Signed)

## 2013-05-01 ENCOUNTER — Ambulatory Visit (INDEPENDENT_AMBULATORY_CARE_PROVIDER_SITE_OTHER): Payer: Medicare Other | Admitting: Internal Medicine

## 2013-05-01 ENCOUNTER — Encounter: Payer: Self-pay | Admitting: Internal Medicine

## 2013-05-01 VITALS — BP 138/84 | HR 72 | Temp 98.1°F | Resp 18 | Ht 72.0 in | Wt 190.2 lb

## 2013-05-01 DIAGNOSIS — E559 Vitamin D deficiency, unspecified: Secondary | ICD-10-CM

## 2013-05-01 DIAGNOSIS — E785 Hyperlipidemia, unspecified: Secondary | ICD-10-CM

## 2013-05-01 DIAGNOSIS — Z79899 Other long term (current) drug therapy: Secondary | ICD-10-CM | POA: Insufficient documentation

## 2013-05-01 DIAGNOSIS — R7309 Other abnormal glucose: Secondary | ICD-10-CM

## 2013-05-01 DIAGNOSIS — I10 Essential (primary) hypertension: Secondary | ICD-10-CM

## 2013-05-01 DIAGNOSIS — R7303 Prediabetes: Secondary | ICD-10-CM

## 2013-05-01 LAB — CBC WITH DIFFERENTIAL/PLATELET
Basophils Absolute: 0 10*3/uL (ref 0.0–0.1)
Basophils Relative: 0 % (ref 0–1)
EOS ABS: 0.1 10*3/uL (ref 0.0–0.7)
EOS PCT: 2 % (ref 0–5)
HEMATOCRIT: 46 % (ref 39.0–52.0)
Hemoglobin: 16.7 g/dL (ref 13.0–17.0)
LYMPHS ABS: 1.2 10*3/uL (ref 0.7–4.0)
Lymphocytes Relative: 25 % (ref 12–46)
MCH: 35.5 pg — AB (ref 26.0–34.0)
MCHC: 36.3 g/dL — ABNORMAL HIGH (ref 30.0–36.0)
MCV: 97.7 fL (ref 78.0–100.0)
MONO ABS: 0.5 10*3/uL (ref 0.1–1.0)
Monocytes Relative: 10 % (ref 3–12)
Neutro Abs: 2.9 10*3/uL (ref 1.7–7.7)
Neutrophils Relative %: 63 % (ref 43–77)
PLATELETS: 223 10*3/uL (ref 150–400)
RBC: 4.71 MIL/uL (ref 4.22–5.81)
RDW: 13.5 % (ref 11.5–15.5)
WBC: 4.6 10*3/uL (ref 4.0–10.5)

## 2013-05-01 LAB — HEPATIC FUNCTION PANEL
ALBUMIN: 4.7 g/dL (ref 3.5–5.2)
ALT: 27 U/L (ref 0–53)
AST: 32 U/L (ref 0–37)
Alkaline Phosphatase: 34 U/L — ABNORMAL LOW (ref 39–117)
Bilirubin, Direct: 0.1 mg/dL (ref 0.0–0.3)
Indirect Bilirubin: 0.6 mg/dL (ref 0.2–1.2)
TOTAL PROTEIN: 7 g/dL (ref 6.0–8.3)
Total Bilirubin: 0.7 mg/dL (ref 0.2–1.2)

## 2013-05-01 LAB — BASIC METABOLIC PANEL WITH GFR
BUN: 20 mg/dL (ref 6–23)
CALCIUM: 9.9 mg/dL (ref 8.4–10.5)
CHLORIDE: 107 meq/L (ref 96–112)
CO2: 22 meq/L (ref 19–32)
CREATININE: 1.34 mg/dL (ref 0.50–1.35)
GFR, Est African American: 63 mL/min
GFR, Est Non African American: 54 mL/min — ABNORMAL LOW
Glucose, Bld: 90 mg/dL (ref 70–99)
Potassium: 4.6 mEq/L (ref 3.5–5.3)
Sodium: 141 mEq/L (ref 135–145)

## 2013-05-01 LAB — LIPID PANEL
CHOL/HDL RATIO: 3.3 ratio
Cholesterol: 241 mg/dL — ABNORMAL HIGH (ref 0–200)
HDL: 72 mg/dL (ref >39)
LDL Cholesterol: 137 mg/dL — ABNORMAL HIGH (ref 0–99)
Triglycerides: 161 mg/dL — ABNORMAL HIGH (ref <150)
VLDL: 32 mg/dL (ref 0–40)

## 2013-05-01 LAB — HEMOGLOBIN A1C
HEMOGLOBIN A1C: 5.5 % (ref <5.7)
Mean Plasma Glucose: 111 mg/dL (ref <117)

## 2013-05-01 LAB — TSH: TSH: 2.578 u[IU]/mL (ref 0.350–4.500)

## 2013-05-01 LAB — MAGNESIUM: Magnesium: 1.8 mg/dL (ref 1.5–2.5)

## 2013-05-01 NOTE — Patient Instructions (Signed)

## 2013-05-01 NOTE — Progress Notes (Signed)
Patient ID: Juan Torres, male   DOB: 04/10/1945, 68 y.o.   MRN: [id number]    This very nice 68 y.o. MWM presents for 3 month follow up with Hypertension, Hyperlipidemia, Pre-Diabetes and Vitamin D Deficiency.    HTN predates since 1998. BP has been controlled at home. Today's BP: 138/84 mmHg . GFR in Dec 2014 calculated at 43 ml/min consistent with Stage III CKD. Patient denies any cardiac type chest pain, palpitations, dyspnea/orthopnea/PND, dizziness, claudication, or dependent edema.   Hyperlipidemia is not controlled with diet & meds. Last Cholesterol was 215, Triglycerides were 166, HDL 64 and LDL 118 in Dec 2014 - not at goal. Patient denies myalgias or other med SE's.    Also, the patient has history of PreDiabetes A1c 5.7% in Mar 2013 with last A1c of 5.4% in Dec 2014. Patient denies any symptoms of reactive hypoglycemia, diabetic polys, paresthesias or visual blurring.   Patient has Hx/o Prostate cancer and underwent Brachiotherapy in Fie 2014 and recent PSA 0.9 with Dr Carrie Mew   Further, Patient has history of Vitamin D Deficiency of 54 in 2013 with last vitamin D of 51 in Dec 2014. Patient supplements vitamin D without any suspected side-effects.  Medication Sig  . amLODipine-olmesartan (AZOR) 5-40 MG per tablet Take 1 tablet by mouth daily.  Marland Kitchen aspirin 81 MG tablet Take 81 mg by mouth daily.  . B Complex Vitamins (VITAMIN B-COMPLEX PO) Take 1 tablet by mouth daily.  . Cholecalciferol (VITAMIN D) 2000 UNITS CAPS Take 2 capsules by mouth daily.  . FENOFIBRATE PO Take 1 capsule by mouth daily. Takes 134 mg daily  . Magnesium 400 MG CAPS Take 2 capsules by mouth daily.  Marland Kitchen omeprazole (PRILOSEC) 20 MG capsule Take 20 mg by mouth daily.  . rosuvastatin (CRESTOR) 20 MG tablet Take 20 mg by mouth daily.     Allergies  Allergen Reactions  . Lipitor [Atorvastatin]   . Niacin And Related     Flushing  . Penicillins Rash    PMHx:   Past Medical History  Diagnosis Date  . Adenomatous  colon polyp   . History of basal cell cancer 2012  . 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 GERD (gastroesophageal reflux disease)   . Hyperlipidemia   . Hypertension   . Prediabetes   . Vitamin D deficiency     FHx:    Reviewed / unchanged  SHx:    Reviewed / unchanged   Systems Review: Constitutional: Denies fever, chills, wt changes, headaches, insomnia, fatigue, night sweats, change in appetite. Eyes: Denies redness, blurred vision, diplopia, discharge, itchy, watery eyes.  ENT: Denies discharge, congestion, post nasal drip, epistaxis, sore throat, earache, hearing loss, dental pain, tinnitus, vertigo, sinus pain, snoring.  CV: Denies chest pain, palpitations, irregular heartbeat, syncope, dyspnea, diaphoresis, orthopnea, PND, claudication, edema. Respiratory: denies cough, dyspnea, DOE, pleurisy, hoarseness, laryngitis, wheezing.  Gastrointestinal: Denies dysphagia, odynophagia, heartburn, reflux, water brash, abdominal pain or cramps, nausea, vomiting, bloating, diarrhea, constipation, hematemesis, melena, hematochezia,  or hemorrhoids. Genitourinary: Denies dysuria, frequency, urgency, nocturia, hesitancy, discharge, hematuria, flank pain. Musculoskeletal: Denies arthralgias, myalgias, stiffness, jt. swelling, pain, limp, strain/sprain.  Skin: Denies pruritus, rash, hives, warts, acne, eczema, change in skin lesion(s). Neuro: No weakness, tremor, incoordination, spasms, paresthesia, or pain. Psychiatric: Denies confusion, memory loss, or sensory loss. Endo: Denies change in weight, skin, hair change.  Heme/Lymph: No excessive bleeding, bruising, orenlarged lymph nodes.   Exam:  BP 138/84  Pulse 72  Temp(Src) 98.1  F (36.7 C) (Temporal)  Resp 18  Ht 6' (1.829 m)  Wt 190 lb 3.2 oz (86.274 kg)  BMI 25.79 kg/m2  Appears well nourished - in no distress. Eyes: PERRLA, EOMs, conjunctiva no swelling or erythema. Sinuses: No  frontal/maxillary tenderness ENT/Mouth: EAC's clear, TM's nl w/o erythema, bulging. Nares clear w/o erythema, swelling, exudates. Oropharynx clear without erythema or exudates. Oral hygiene is good. Tongue normal, non obstructing. Hearing intact.  Neck: Supple. Thyroid nl. Car 2+/2+ without bruits, nodes or JVD. Chest: Respirations nl with BS clear & equal w/o rales, rhonchi, wheezing or stridor.  Cor: Heart sounds normal w/ regular rate and rhythm without sig. murmurs, gallops, clicks, or rubs. Peripheral pulses normal and equal  without edema.  Abdomen: Soft & bowel sounds normal. Non-tender w/o guarding, rebound, hernias, masses, or organomegaly.  Lymphatics: Unremarkable.  Musculoskeletal: Full ROM all peripheral extremities, joint stability, 5/5 strength, and normal gait.  Skin: Warm, dry without exposed rashes, lesions, ecchymosis apparent.  Neuro: Cranial nerves intact, reflexes equal bilaterally. Sensory-motor testing grossly intact. Tendon reflexes grossly intact. Fine tremor of Rt hand. Pysch: Alert & oriented x 3. Insight and judgement nl & appropriate. No ideations.  Assessment and Plan:  1. Hypertension - Continue monitor blood pressure at home. Continue diet/meds same.  2. Hyperlipidemia - Continue diet/meds, exercise,& lifestyle modifications. Continue monitor periodic cholesterol/liver & renal functions   3. Pre-diabetes - Continue diet, exercise, lifestyle modifications. Monitor appropriate labs.  4. Vitamin D Deficiency - Continue supplementation.  5. GERD  6. Prostate Cancer, Hx  Recommended regular exercise, BP monitoring, weight control, and discussed med and SE's. Recommended labs to assess and monitor clinical status. Further disposition pending results of labs.

## 2013-05-02 LAB — INSULIN, FASTING: Insulin fasting, serum: 7 u[IU]/mL (ref 3–28)

## 2013-05-02 LAB — VITAMIN D 25 HYDROXY (VIT D DEFICIENCY, FRACTURES): Vit D, 25-Hydroxy: 45 ng/mL (ref 30–89)

## 2013-08-05 ENCOUNTER — Ambulatory Visit (INDEPENDENT_AMBULATORY_CARE_PROVIDER_SITE_OTHER): Payer: Medicare Other | Admitting: Emergency Medicine

## 2013-08-05 ENCOUNTER — Encounter: Payer: Self-pay | Admitting: Emergency Medicine

## 2013-08-05 VITALS — BP 106/74 | HR 68 | Temp 97.9°F | Resp 16 | Wt 186.2 lb

## 2013-08-05 DIAGNOSIS — I10 Essential (primary) hypertension: Secondary | ICD-10-CM

## 2013-08-05 DIAGNOSIS — E782 Mixed hyperlipidemia: Secondary | ICD-10-CM

## 2013-08-05 LAB — CBC WITH DIFFERENTIAL/PLATELET
Basophils Absolute: 0 10*3/uL (ref 0.0–0.1)
Basophils Relative: 1 % (ref 0–1)
Eosinophils Absolute: 0.1 10*3/uL (ref 0.0–0.7)
Eosinophils Relative: 2 % (ref 0–5)
HEMATOCRIT: 44.9 % (ref 39.0–52.0)
Hemoglobin: 15.9 g/dL (ref 13.0–17.0)
LYMPHS ABS: 1 10*3/uL (ref 0.7–4.0)
LYMPHS PCT: 23 % (ref 12–46)
MCH: 35.1 pg — ABNORMAL HIGH (ref 26.0–34.0)
MCHC: 35.4 g/dL (ref 30.0–36.0)
MCV: 99.1 fL (ref 78.0–100.0)
MONO ABS: 0.5 10*3/uL (ref 0.1–1.0)
Monocytes Relative: 11 % (ref 3–12)
Neutro Abs: 2.8 10*3/uL (ref 1.7–7.7)
Neutrophils Relative %: 63 % (ref 43–77)
Platelets: 202 10*3/uL (ref 150–400)
RBC: 4.53 MIL/uL (ref 4.22–5.81)
RDW: 13 % (ref 11.5–15.5)
WBC: 4.4 10*3/uL (ref 4.0–10.5)

## 2013-08-05 LAB — HEPATIC FUNCTION PANEL
ALK PHOS: 32 U/L — AB (ref 39–117)
ALT: 21 U/L (ref 0–53)
AST: 29 U/L (ref 0–37)
Albumin: 4.3 g/dL (ref 3.5–5.2)
Bilirubin, Direct: 0.1 mg/dL (ref 0.0–0.3)
Indirect Bilirubin: 0.6 mg/dL (ref 0.2–1.2)
Total Bilirubin: 0.7 mg/dL (ref 0.2–1.2)
Total Protein: 6.3 g/dL (ref 6.0–8.3)

## 2013-08-05 LAB — BASIC METABOLIC PANEL WITH GFR
BUN: 17 mg/dL (ref 6–23)
CHLORIDE: 107 meq/L (ref 96–112)
CO2: 24 meq/L (ref 19–32)
Calcium: 9.6 mg/dL (ref 8.4–10.5)
Creat: 1.13 mg/dL (ref 0.50–1.35)
GFR, EST NON AFRICAN AMERICAN: 67 mL/min
GFR, Est African American: 77 mL/min
Glucose, Bld: 88 mg/dL (ref 70–99)
POTASSIUM: 4.1 meq/L (ref 3.5–5.3)
SODIUM: 140 meq/L (ref 135–145)

## 2013-08-05 LAB — LIPID PANEL
Cholesterol: 210 mg/dL — ABNORMAL HIGH (ref 0–200)
HDL: 57 mg/dL (ref >39)
LDL Cholesterol: 103 mg/dL — ABNORMAL HIGH (ref 0–99)
Total CHOL/HDL Ratio: 3.7 Ratio
Triglycerides: 251 mg/dL — ABNORMAL HIGH (ref <150)
VLDL: 50 mg/dL — ABNORMAL HIGH (ref 0–40)

## 2013-08-05 NOTE — Patient Instructions (Signed)
Hypotension Please call if any symptoms As your heart beats, it forces blood through your body. This force is called blood pressure. If you have hypotension, you have low blood pressure. When your blood pressure is too low, you may not get enough blood to your brain. You may feel weak, feel lightheaded, have a fast heartbeat, or even pass out (faint). HOME CARE  Drink enough fluids to keep your pee (urine) clear or pale yellow.  Take all medicines as told by your doctor.  Get up slowly after sitting or lying down.  Wear support stockings as told by your doctor.  Maintain a healthy diet by including foods such as fruits, vegetables, nuts, whole grains, and lean meats. GET HELP IF:  You are throwing up (vomiting) or have watery poop (diarrhea).  You have a fever for more than 2-3 days.  You feel more thirsty than usual.  You feel weak and tired. GET HELP RIGHT AWAY IF:   You pass out (faint).  You have chest pain or a fast or irregular heartbeat.  You lose feeling in part of your body.  You cannot move your arms or legs.  You have trouble speaking.  You get sweaty or feel lightheaded. MAKE SURE YOU:   Understand these instructions.  Will watch your condition.  Will get help right away if you are not doing well or get worse. Document Released: 04/20/2009 Document Revised: 09/26/2012 Document Reviewed: 07/27/2012 [facility] Patient Information 2015 [place], [place]. This information is not intended to replace advice given to you by your health care provider. Make sure you discuss any questions you have with your health care provider.

## 2013-08-05 NOTE — Progress Notes (Signed)
Subjective:    Patient ID: Juan Torres, male    DOB: 12/31/1945, 68 y.o.   MRN: [id number]  HPI Comments: 68 yo WM presents for 3 month F/U for HTN, Cholesterol, Pre-Dm, D. Deficient. He is trying to eat better. He is exercising more. He notes BP is good at home and denies any hypotensive episodes.   WBC             4.6   05/01/2013 HGB            16.7   05/01/2013 HCT            46.0   05/01/2013 PLT             223   05/01/2013 GLUCOSE          90   05/01/2013 CHOL            241   05/01/2013 TRIG            161   05/01/2013 HDL              72   05/01/2013 LDLCALC         137   05/01/2013 ALT              27   05/01/2013 AST              32   05/01/2013 NA              141   05/01/2013 K               4.6   05/01/2013 CL              107   05/01/2013 CREATININE     1.34   05/01/2013 BUN              20   05/01/2013 CO2              22   05/01/2013 TSH           2.578   05/01/2013 INR            0.97   03/28/2012 HGBA1C          5.5   05/01/2013     Medication List       This list is accurate as of: 08/05/13  8:57 AM.  Always use your most recent med list.               amLODipine-olmesartan 5-40 MG per tablet  Commonly known as:  AZOR  Take 1 tablet by mouth daily.     aspirin 81 MG tablet  Take 81 mg by mouth daily.     FENOFIBRATE PO  Take 1 capsule by mouth daily. Takes 134 mg daily     FISH OIL PO  Take by mouth.     Magnesium 400 MG Caps  Take 2 capsules by mouth daily.     omeprazole 20 MG capsule  Commonly known as:  PRILOSEC  Take 20 mg by mouth daily.     rosuvastatin 20 MG tablet  Commonly known as:  CRESTOR  Take 20 mg by mouth daily.     VITAMIN B-COMPLEX PO  Take 1 tablet by mouth daily.     Vitamin D 2000 UNITS Caps  Take 2 capsules by mouth daily.     vitamin E 1000 UNIT capsule  Take 1,000 Units by mouth daily.  Allergies  Allergen Reactions  . Lipitor [Atorvastatin]   . Niacin And Related     Flushing  . Penicillins Rash   Past  Medical History  Diagnosis Date  . Adenomatous colon polyp   . History of basal cell cancer 2012  . 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 GERD (gastroesophageal reflux disease)   . Hyperlipidemia   . Hypertension   . Prediabetes   . Vitamin D deficiency       Review of Systems  All other systems reviewed and are negative.  BP 106/74  Pulse 68  Temp(Src) 97.9 F (36.6 C)  Resp 16  Wt 186 lb 3.2 oz (84.46 kg)     Objective:   Physical Exam  Nursing note and vitals reviewed. Constitutional: He is oriented to person, place, and time. He appears well-developed and well-nourished.  HENT:  Head: Normocephalic and atraumatic.  Right Ear: External ear normal.  Left Ear: External ear normal.  Nose: Nose normal.  Eyes: Conjunctivae and EOM are normal.  Neck: Normal range of motion. Neck supple. No JVD present. No thyromegaly present.  Cardiovascular: Normal rate, regular rhythm, normal heart sounds and intact distal pulses.   Pulmonary/Chest: Effort normal and breath sounds normal.  Abdominal: Soft. Bowel sounds are normal. He exhibits no distension. There is no tenderness.  Musculoskeletal: Normal range of motion. He exhibits no edema and no tenderness.  Lymphadenopathy:    He has no cervical adenopathy.  Neurological: He is alert and oriented to person, place, and time. No cranial nerve deficit. Coordination normal.  Skin: Skin is warm and dry.  Psychiatric: He has a normal mood and affect. His behavior is normal. Judgment and thought content normal.          Assessment & Plan:  1.  3 month F/U for HTN, Cholesterol, Pre-Dm, D. Deficient. Needs healthy diet, cardio QD and obtain healthy weight. Check Labs, Check BP if >130/80 call office

## 2013-08-06 ENCOUNTER — Telehealth: Payer: Self-pay

## 2013-08-06 NOTE — Telephone Encounter (Signed)
Message copied by Nadyne Coombes on Tue Aug 06, 2013 10:35 AM ------      Message from: [place], Lenna Sciara R      Created: Tue Aug 06, 2013  7:04 AM       Triglycerides elevated decrease Carbohydrates including alcohol and fried foods. Add Fish oil 1000mg . Cholesterol is not in range but mild improvement, need to eat healthy diet, exercise daily, and lose weight. Rest OK, continue remaining prescriptions and vitamins the same ------

## 2013-08-06 NOTE — Telephone Encounter (Signed)
Left message for patient to return my call for lab results. 

## 2013-09-30 ENCOUNTER — Encounter: Payer: Self-pay | Admitting: Gastroenterology

## 2013-10-31 ENCOUNTER — Encounter: Payer: Self-pay | Admitting: Internal Medicine

## 2013-11-08 ENCOUNTER — Other Ambulatory Visit: Payer: Self-pay | Admitting: Physician Assistant

## 2013-12-06 ENCOUNTER — Ambulatory Visit (INDEPENDENT_AMBULATORY_CARE_PROVIDER_SITE_OTHER): Payer: Medicare Other | Admitting: Internal Medicine

## 2013-12-06 ENCOUNTER — Encounter: Payer: Self-pay | Admitting: Internal Medicine

## 2013-12-06 VITALS — BP 144/92 | HR 68 | Temp 97.9°F | Resp 16 | Ht 72.5 in | Wt 190.5 lb

## 2013-12-06 DIAGNOSIS — E785 Hyperlipidemia, unspecified: Secondary | ICD-10-CM

## 2013-12-06 DIAGNOSIS — E291 Testicular hypofunction: Secondary | ICD-10-CM

## 2013-12-06 DIAGNOSIS — Z125 Encounter for screening for malignant neoplasm of prostate: Secondary | ICD-10-CM

## 2013-12-06 DIAGNOSIS — Z1212 Encounter for screening for malignant neoplasm of rectum: Secondary | ICD-10-CM

## 2013-12-06 DIAGNOSIS — I10 Essential (primary) hypertension: Secondary | ICD-10-CM

## 2013-12-06 DIAGNOSIS — E349 Endocrine disorder, unspecified: Secondary | ICD-10-CM

## 2013-12-06 DIAGNOSIS — Z79899 Other long term (current) drug therapy: Secondary | ICD-10-CM

## 2013-12-06 DIAGNOSIS — R6889 Other general symptoms and signs: Secondary | ICD-10-CM

## 2013-12-06 DIAGNOSIS — K219 Gastro-esophageal reflux disease without esophagitis: Secondary | ICD-10-CM

## 2013-12-06 DIAGNOSIS — R7303 Prediabetes: Secondary | ICD-10-CM

## 2013-12-06 DIAGNOSIS — E559 Vitamin D deficiency, unspecified: Secondary | ICD-10-CM

## 2013-12-06 DIAGNOSIS — C61 Malignant neoplasm of prostate: Secondary | ICD-10-CM

## 2013-12-06 DIAGNOSIS — Z0001 Encounter for general adult medical examination with abnormal findings: Secondary | ICD-10-CM

## 2013-12-06 LAB — CBC WITH DIFFERENTIAL/PLATELET
BASOS ABS: 0 10*3/uL (ref 0.0–0.1)
Basophils Relative: 0 % (ref 0–1)
Eosinophils Absolute: 0.1 10*3/uL (ref 0.0–0.7)
Eosinophils Relative: 1 % (ref 0–5)
HCT: 45.2 % (ref 39.0–52.0)
HEMOGLOBIN: 16.3 g/dL (ref 13.0–17.0)
Lymphocytes Relative: 25 % (ref 12–46)
Lymphs Abs: 1.5 10*3/uL (ref 0.7–4.0)
MCH: 35.1 pg — ABNORMAL HIGH (ref 26.0–34.0)
MCHC: 36.1 g/dL — AB (ref 30.0–36.0)
MCV: 97.4 fL (ref 78.0–100.0)
Monocytes Absolute: 0.6 10*3/uL (ref 0.1–1.0)
Monocytes Relative: 10 % (ref 3–12)
NEUTROS ABS: 3.7 10*3/uL (ref 1.7–7.7)
NEUTROS PCT: 64 % (ref 43–77)
PLATELETS: 220 10*3/uL (ref 150–400)
RBC: 4.64 MIL/uL (ref 4.22–5.81)
RDW: 12.9 % (ref 11.5–15.5)
WBC: 5.8 10*3/uL (ref 4.0–10.5)

## 2013-12-06 NOTE — Progress Notes (Signed)
Patient ID: Juan Torres, male   DOB: November 03, 1945, 68 y.o.   MRN: [id number]  MEDICARE ANNUAL WELLNESS & PREVENTATIVE VISIT AND CPE  Assessment:   1. Encounter for general adult medical examination with abnormal findings   2. Essential hypertension  - Microalbumin / creatinine urine ratio - EKG 12-Lead - [place], RETROPERITNL ABD,  LTD - TSH  3. Hyperlipidemia  - Lipid panel  4. Prediabetes  - Hemoglobin A1c - Insulin, fasting  5. Vitamin D deficiency  - Vit D  25 hydroxy (rtn osteoporosis monitoring)  6. Prostate cancer   7. Gastroesophageal reflux disease, esophagitis presence not specified   8. Screening for rectal cancer  - POC Hemoccult Bld/Stl (3-Cd Home Screen); Future  9. Prostate cancer screening  - PSA  10. Testosterone deficiency  11. Medication management  - Urine Microscopic - CBC with Differential - BASIC METABOLIC PANEL WITH GFR - Hepatic function panel - Magnesium  Plan:   During the course of the visit the patient was educated and counseled about appropriate screening and preventive services including:    Pneumococcal vaccine   Influenza vaccine  Td vaccine  Screening electrocardiogram  Bone densitometry screening  Colorectal cancer screening  Diabetes screening  Glaucoma screening  Nutrition counseling   Advanced directives: requested  Screening recommendations, referrals: Vaccinations: DT vaccine 10/20/2011 Influenza vaccine declined Pneumococcal vaccine declined Prevnar vaccine declined Shingles vaccine 10/20/2011 Hep B vaccine had many years ago  Nutrition assessed and recommended  Colonoscopy 12/02/2011 Recommended yearly ophthalmology/optometry visit for glaucoma screening and checkup Recommended yearly dental visit for hygiene and checkup Advanced directives - No  Conditions/risks identified: BMI: Discussed weight loss, diet, and increase physical activity.  Increase physical activity: AHA recommends 150  minutes of physical activity a week.  Medications reviewed PreDiabetes is at goal, ACE/ARB therapy: Yes. Urinary Incontinence is not an issue: discussed non pharmacology and pharmacology options.  Fall risk: low- discussed PT, home fall assessment, medications.   Subjective:  Juan Torres is a 68 y.o. male who presents for Medicare Annual Wellness Visit and complete physical.  Date of last medicare wellness visit is unknown.  He has had elevated blood pressure since 1998. His blood pressure has been controlled at home, today their BP is BP: 144/92 mmHg Patient was treated by Brachitherapy 03/2012 (Dr Jasmine December) He does workout. He denies chest pain, shortness of breath, dizziness.  He is on cholesterol medication and denies myalgias. His cholesterol is at goal. The cholesterol last visit was:  Lab Results  Component Value Date   CHOL 210* 08/05/2013   HDL 57 08/05/2013   LDLCALC 103* 08/05/2013   TRIG 251* 08/05/2013   CHOLHDL 3.7 08/05/2013   He has had prediabetes for 1&1/2  Years  years since March 2014 (A1c 5.7%). He has been working on diet and exercise for prediabetes, and denies foot ulcerations, hyperglycemia, hypoglycemia , nausea, paresthesia of the feet, polydipsia, polyuria, visual disturbances and vomiting. Last A1C in the office was:  Lab Results  Component Value Date   HGBA1C 5.5 05/01/2013   Patient is on Vitamin D supplement.   Lab Results  Component Value Date   VD25OH 45 05/01/2013     Names of Other Physician/Practitioners you currently use: 1. [facility] here for primary care 2. Dr Katy Fitch, eye doctor, last visit 2013 3. Dr Mariea Clonts, dentist - every 3 months  Patient Care Team: Unk Pinto, MD as PCP - General (Internal Medicine) Inda Castle, MD as Consulting  Physician (Gastroenterology) Rexene Edison, MD as Consulting Physician (Radiation Oncology) Clent Jacks, MD as Consulting Physician (Ophthalmology)  Medication  Review: Medication Sig  . amLODipine-olmesartan (AZOR) 5-40 MG per tablet Take 1 tablet by mouth daily.  Marland Kitchen aspirin 81 MG tablet Take 81 mg by mouth daily.  . B Complex Vitamins (VITAMIN B-COMPLEX PO) Take 1 tablet by mouth daily.  Marland Kitchen VITAMIN D 2000 UNITS CAPS Take 2 capsules by mouth daily.  . CRESTOR 40 MG tablet Take 1 tablet by mouth at  bedtime  . FENOFIBRATE PO Take 1 capsule by mouth daily. Takes 134 mg daily  . Magnesium 400 MG CAPS Take 2 capsules by mouth daily.  . Omega-3 Fatty Acids (FISH OIL PO) Take by mouth.  Marland Kitchen omeprazole (PRILOSEC) 20 MG capsule Take 20 mg by mouth daily.  . rosuvastatin (CRESTOR) 20 MG tablet Take 20 mg by mouth daily.  . vitamin E 1000 UNIT capsule Take 1,000 Units by mouth daily.   Current Problems (verified) Patient Active Problem List   Diagnosis Date Noted  . Testosterone deficiency 12/06/2013  . Medication management 05/01/2013  . GERD (gastroesophageal reflux disease)   . Hyperlipidemia   . Hypertension   . Prediabetes   . Vitamin D deficiency   . Prostate cancer 12/19/2011   Screening Tests Health Maintenance  Topic Date Due  . Tetanus/tdap  10/31/1964  . Pneumococcal Polysaccharide Vaccine Age 66 And Over  11/01/2010  . Influenza Vaccine  09/07/2013  . Colonoscopy  12/01/2021  . Zostavax  Completed   Immunization History  Administered Date(s) Administered  . DT 10/20/2011  . Zoster 10/20/2011   Preventative care: Last colonoscopy: 12/02/2011 (10 yr f/u)  Prior vaccinations: DT  : 10/20/2011  Influenza: declined   Pneumococcal: declined Shingles/Zostavax: declined  History reviewed: allergies, current medications, past family history, past medical history, past social history, past surgical history and problem list  Risk Factors: Tobacco History  Substance Use Topics  . Smoking status: Former Smoker -- 1.00 packs/day for 20 years    Types: Cigarettes    Quit date: 03/30/2010  . Smokeless tobacco: Never Used  . Alcohol  Use: 21.6 oz/week    36 Cans of beer per week     Comment: 4-6 beers daily   He does not smoke.  Patient is a former smoker. Are there smokers in your home (other than you)?  No  Alcohol Current alcohol use: 3 beers per day(s)  Caffeine Current caffeine use: coffee decaf every other day  Exercise Current exercise: walking and yard work  Nutrition/Diet Current diet: in general, a "healthy" diet    Cardiac risk factors: advanced age (older than 95 for men, 22 for women), dyslipidemia, hypertension, male gender and smoking/ tobacco exposure.  Depression Screen (Note: if answer to either of the following is "Yes", a more complete depression screening is indicated)   Q1: Over the past two weeks, have you felt down, depressed or hopeless? No  Q2: Over the past two weeks, have you felt little interest or pleasure in doing things? No  Have you lost interest or pleasure in daily life? No  Do you often feel hopeless? No  Do you cry easily over simple problems? No  Activities of Daily Living In your present state of health, do you have any difficulty performing the following activities?:  Driving? No Managing money?  No Feeding yourself? No Getting from bed to chair? No Climbing a flight of stairs? No Preparing food and eating?: No Bathing  or showering? No Getting dressed: No Getting to the toilet? No Using the toilet:No Moving around from place to place: No In the past year have you fallen or had a near fall?:No   Are you sexually active?  Yes  Do you have more than one partner?  No  Vision Difficulties: No  Hearing Difficulties: No Do you often ask people to speak up or repeat themselves? No Do you experience ringing or noises in your ears? No Do you have difficulty understanding soft or whispered voices? No  Cognition  Do you feel that you have a problem with memory?No  Do you often misplace items? No  Do you feel safe at home?  Yes  Advanced directives Does  patient have a [place]? No Does patient have a Living Will? No (Offered forms)   Objective:     Blood pressure 144/92, pulse 68, temperature 97.9 F (36.6 C), resp. rate 16, height 6' 0.5" (1.842 m), weight 190 lb 8 oz (86.41 kg). Body mass index is 25.47 kg/(m^2).  General appearance: alert, no distress, WD/WN, male Cognitive Testing  Alert? Yes  Normal Appearance? Yes  Oriented to person? Yes  Place? Yes   Time? Yes  Recall of three objects?  Yes  Can perform simple calculations? Yes  Displays appropriate judgment? Yes  Can read the correct time from a watch/clock? Yes  HEENT: normocephalic, sclerae anicteric, TMs pearly, nares patent, no discharge or erythema, pharynx normal Oral cavity: MMM, no lesions Neck: supple, no lymphadenopathy, no thyromegaly, no masses Heart: RRR, normal S1, S2, no murmurs Lungs: CTA bilaterally, no wheezes, rhonchi, or rales Abdomen: +bs, soft, non tender, non distended, no masses, no hepatomegaly, no splenomegaly Musculoskeletal: nontender, no swelling, no obvious deformity Extremities: no edema, no cyanosis, no clubbing Pulses: 2+ symmetric, upper and lower extremities, normal cap refill Neurological: alert, oriented x 3, CN2-12 intact, strength normal upper extremities and lower extremities, sensation normal throughout, DTRs 2+ throughout, no cerebellar signs, gait normal Psychiatric: normal affect, behavior normal, pleasant   Medicare Attestation I have personally reviewed: The patient's medical and social history Their use of alcohol, tobacco or illicit drugs Their current medications and supplements The patient's functional ability including ADLs,fall risks, home safety risks, cognitive, and hearing and visual impairment Diet and physical activities Evidence for depression or mood disorders  The patient's weight, height, BMI, and visual acuity have been recorded in the chart.  I have made referrals, counseling, and  provided education to the patient based on review of the above and I have provided the patient with a written personalized care plan for preventive services.    Shaul Trautman DAVID, MD   12/06/2013

## 2013-12-07 LAB — HEPATIC FUNCTION PANEL
ALT: 20 U/L (ref 0–53)
AST: 25 U/L (ref 0–37)
Albumin: 4.4 g/dL (ref 3.5–5.2)
Alkaline Phosphatase: 31 U/L — ABNORMAL LOW (ref 39–117)
BILIRUBIN INDIRECT: 0.4 mg/dL (ref 0.2–1.2)
Bilirubin, Direct: 0.1 mg/dL (ref 0.0–0.3)
Total Bilirubin: 0.5 mg/dL (ref 0.2–1.2)
Total Protein: 6.3 g/dL (ref 6.0–8.3)

## 2013-12-07 LAB — LIPID PANEL
CHOL/HDL RATIO: 3.3 ratio
Cholesterol: 202 mg/dL — ABNORMAL HIGH (ref 0–200)
HDL: 62 mg/dL (ref >39)
LDL Cholesterol: 112 mg/dL — ABNORMAL HIGH (ref 0–99)
TRIGLYCERIDES: 141 mg/dL (ref <150)
VLDL: 28 mg/dL (ref 0–40)

## 2013-12-07 LAB — URINALYSIS, MICROSCOPIC ONLY
BACTERIA UA: NONE SEEN
CASTS: NONE SEEN
Crystals: NONE SEEN
Squamous Epithelial / LPF: NONE SEEN

## 2013-12-07 LAB — BASIC METABOLIC PANEL WITH GFR
BUN: 24 mg/dL — ABNORMAL HIGH (ref 6–23)
CALCIUM: 9.5 mg/dL (ref 8.4–10.5)
CO2: 26 mEq/L (ref 19–32)
Chloride: 104 mEq/L (ref 96–112)
Creat: 1.44 mg/dL — ABNORMAL HIGH (ref 0.50–1.35)
GFR, EST AFRICAN AMERICAN: 57 mL/min — AB
GFR, EST NON AFRICAN AMERICAN: 50 mL/min — AB
Glucose, Bld: 101 mg/dL — ABNORMAL HIGH (ref 70–99)
POTASSIUM: 4.6 meq/L (ref 3.5–5.3)
Sodium: 139 mEq/L (ref 135–145)

## 2013-12-07 LAB — INSULIN, FASTING: Insulin fasting, serum: 12.8 u[IU]/mL (ref 2.0–19.6)

## 2013-12-07 LAB — TSH: TSH: 4.556 u[IU]/mL — ABNORMAL HIGH (ref 0.350–4.500)

## 2013-12-07 LAB — MICROALBUMIN / CREATININE URINE RATIO
Creatinine, Urine: 132.6 mg/dL
Microalb Creat Ratio: 5.3 mg/g (ref 0.0–30.0)
Microalb, Ur: 0.7 mg/dL (ref <2.0)

## 2013-12-07 LAB — VITAMIN D 25 HYDROXY (VIT D DEFICIENCY, FRACTURES): Vit D, 25-Hydroxy: 55 ng/mL (ref 30–89)

## 2013-12-07 LAB — HEMOGLOBIN A1C
Hgb A1c MFr Bld: 5.4 % (ref <5.7)
MEAN PLASMA GLUCOSE: 108 mg/dL (ref <117)

## 2013-12-07 LAB — MAGNESIUM: MAGNESIUM: 1.9 mg/dL (ref 1.5–2.5)

## 2013-12-07 LAB — PSA: PSA: 0.8 ng/mL (ref <=4.00)

## 2013-12-07 LAB — TESTOSTERONE: Testosterone: 469 ng/dL (ref 300–890)

## 2014-02-17 ENCOUNTER — Other Ambulatory Visit: Payer: Self-pay | Admitting: Internal Medicine

## 2014-03-13 ENCOUNTER — Ambulatory Visit: Payer: Self-pay | Admitting: Physician Assistant

## 2014-03-20 ENCOUNTER — Ambulatory Visit (INDEPENDENT_AMBULATORY_CARE_PROVIDER_SITE_OTHER): Payer: 59 | Admitting: Internal Medicine

## 2014-03-20 ENCOUNTER — Encounter: Payer: Self-pay | Admitting: Internal Medicine

## 2014-03-20 VITALS — BP 124/82 | HR 72 | Temp 98.4°F | Resp 16 | Ht 72.0 in | Wt 190.2 lb

## 2014-03-20 DIAGNOSIS — I10 Essential (primary) hypertension: Secondary | ICD-10-CM

## 2014-03-20 DIAGNOSIS — R7303 Prediabetes: Secondary | ICD-10-CM

## 2014-03-20 DIAGNOSIS — Z9181 History of falling: Secondary | ICD-10-CM

## 2014-03-20 DIAGNOSIS — Z0000 Encounter for general adult medical examination without abnormal findings: Secondary | ICD-10-CM

## 2014-03-20 DIAGNOSIS — E785 Hyperlipidemia, unspecified: Secondary | ICD-10-CM

## 2014-03-20 DIAGNOSIS — Z79899 Other long term (current) drug therapy: Secondary | ICD-10-CM

## 2014-03-20 DIAGNOSIS — Z1331 Encounter for screening for depression: Secondary | ICD-10-CM

## 2014-03-20 DIAGNOSIS — Z0001 Encounter for general adult medical examination with abnormal findings: Secondary | ICD-10-CM

## 2014-03-20 DIAGNOSIS — R6889 Other general symptoms and signs: Secondary | ICD-10-CM

## 2014-03-20 DIAGNOSIS — E349 Endocrine disorder, unspecified: Secondary | ICD-10-CM

## 2014-03-20 DIAGNOSIS — E559 Vitamin D deficiency, unspecified: Secondary | ICD-10-CM

## 2014-03-20 DIAGNOSIS — R7309 Other abnormal glucose: Secondary | ICD-10-CM

## 2014-03-20 LAB — CBC WITH DIFFERENTIAL/PLATELET
BASOS ABS: 0 10*3/uL (ref 0.0–0.1)
BASOS PCT: 0 % (ref 0–1)
Eosinophils Absolute: 0.1 10*3/uL (ref 0.0–0.7)
Eosinophils Relative: 2 % (ref 0–5)
HCT: 45.2 % (ref 39.0–52.0)
Hemoglobin: 16 g/dL (ref 13.0–17.0)
Lymphocytes Relative: 26 % (ref 12–46)
Lymphs Abs: 1.2 10*3/uL (ref 0.7–4.0)
MCH: 35.6 pg — ABNORMAL HIGH (ref 26.0–34.0)
MCHC: 35.4 g/dL (ref 30.0–36.0)
MCV: 100.7 fL — ABNORMAL HIGH (ref 78.0–100.0)
MPV: 9.3 fL (ref 8.6–12.4)
Monocytes Absolute: 0.5 10*3/uL (ref 0.1–1.0)
Monocytes Relative: 11 % (ref 3–12)
NEUTROS PCT: 61 % (ref 43–77)
Neutro Abs: 2.9 10*3/uL (ref 1.7–7.7)
PLATELETS: 245 10*3/uL (ref 150–400)
RBC: 4.49 MIL/uL (ref 4.22–5.81)
RDW: 12.9 % (ref 11.5–15.5)
WBC: 4.8 10*3/uL (ref 4.0–10.5)

## 2014-03-20 LAB — BASIC METABOLIC PANEL WITH GFR
BUN: 21 mg/dL (ref 6–23)
CO2: 23 mEq/L (ref 19–32)
CREATININE: 1.62 mg/dL — AB (ref 0.50–1.35)
Calcium: 9.5 mg/dL (ref 8.4–10.5)
Chloride: 107 mEq/L (ref 96–112)
GFR, Est African American: 50 mL/min — ABNORMAL LOW
GFR, Est Non African American: 43 mL/min — ABNORMAL LOW
GLUCOSE: 87 mg/dL (ref 70–99)
Potassium: 4 mEq/L (ref 3.5–5.3)
SODIUM: 140 meq/L (ref 135–145)

## 2014-03-20 LAB — LIPID PANEL
CHOL/HDL RATIO: 3.5 ratio
CHOLESTEROL: 216 mg/dL — AB (ref 0–200)
HDL: 61 mg/dL (ref >39)
Triglycerides: 440 mg/dL — ABNORMAL HIGH (ref <150)

## 2014-03-20 LAB — HEPATIC FUNCTION PANEL
ALK PHOS: 34 U/L — AB (ref 39–117)
ALT: 18 U/L (ref 0–53)
AST: 25 U/L (ref 0–37)
Albumin: 4.1 g/dL (ref 3.5–5.2)
Bilirubin, Direct: 0.2 mg/dL (ref 0.0–0.3)
Indirect Bilirubin: 0.6 mg/dL (ref 0.2–1.2)
TOTAL PROTEIN: 6.5 g/dL (ref 6.0–8.3)
Total Bilirubin: 0.8 mg/dL (ref 0.2–1.2)

## 2014-03-20 LAB — TSH: TSH: 4.312 u[IU]/mL (ref 0.350–4.500)

## 2014-03-20 LAB — MAGNESIUM: Magnesium: 2 mg/dL (ref 1.5–2.5)

## 2014-03-20 NOTE — Progress Notes (Signed)
Patient ID: Juan Torres, male   DOB: 08-10-1945, 69 y.o.   MRN: [id number]  Patient ID: Juan Torres, male   DOB: 04/13/1945, 69 y.o.   MRN: [id number]  MEDICARE ANNUAL WELLNESS & PREVENTATIVE VISIT AND OV  Assessment:   1. Essential hypertension  - TSH  2. Hyperlipidemia  - Lipid panel  3. Prediabetes  - Hemoglobin A1c - Insulin, fasting  4. Vitamin D deficiency  - Vit D  25 hydroxy (rtn osteoporosis monitoring)  5. Testosterone deficiency   6. Medication management  - CBC with Differential/Platelet - BASIC METABOLIC PANEL WITH GFR - Hepatic function panel - Magnesium  Plan:   During the course of the visit the patient was educated and counseled about appropriate screening and preventive services including:    Pneumococcal vaccine   Influenza vaccine  Td vaccine  Screening electrocardiogram  Bone densitometry screening  Colorectal cancer screening  Diabetes screening  Glaucoma screening  Nutrition counseling   Advanced directives: requested  Screening recommendations, referrals: Vaccinations: DT vaccine 10/20/2011 Influenza vaccine declined Pneumococcal vaccine declined Prevnar vaccine declined Shingles vaccine 10/20/2011 Hep B vaccine had many years ago  Nutrition assessed and recommended  Colonoscopy 12/02/2011 Recommended yearly ophthalmology/optometry visit for glaucoma screening and checkup Recommended yearly dental visit for hygiene and checkup Advanced directives - No  Conditions/risks identified: BMI: Discussed weight loss, diet, and increase physical activity.  Increase physical activity: AHA recommends 150 minutes of physical activity a week.  Medications reviewed PreDiabetes is at goal, ACE/ARB therapy: Yes. Urinary Incontinence is not an issue: discussed non pharmacology and pharmacology options.  Fall risk: low- discussed PT, home fall assessment, medications.   Subjective:  Juan Torres is a 69 y.o. MWM who presents for  Medicare Annual Wellness Visit and complete OV.  Date of last medicare wellness visit was 12/06/2013.  He has had elevated blood pressure since 1998, altho treatment wasn't initiated until  2006. 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  His blood pressure has been controlled at home and today his BP is BP: 124/82 mmHg   Patient was treated for Prostate Cancer by Brachiotherapy 03/2012 (Dr Jasmine December)  He does workout. He denies chest pain, shortness of breath, dizziness.  He is on cholesterol medication and denies myalgias. His cholesterol is at goal. The cholesterol today was:  Lab Results  Component Value Date   CHOL 216* 03/20/2014   HDL 61 03/20/2014   LDLCALC NOT CALC 03/20/2014   TRIG 440* 03/20/2014   CHOLHDL 3.5 03/20/2014   He has had prediabetes for 1&1/2  Years  years since March 2014 (A1c 5.7%). He has been working on diet and exercise for prediabetes, and denies foot ulcerations, hyperglycemia, hypoglycemia , nausea, paresthesia of the feet, polydipsia, polyuria, visual disturbances and vomiting. Last A1C in the office was:  Lab Results  Component Value Date   HGBA1C 5.4 12/06/2013   Patient is on Vitamin D supplement.  (Vit D 33 in 2013) Lab Results  Component Value Date   VD25OH 55 12/06/2013     Names of Other Physician/Practitioners you currently use: 1. [facility] here for primary care 2. Dr Katy Fitch, eye doctor, last visit 2013 3. Dr Mariea Clonts, dentist - every 3 months  Patient Care Team: Unk Pinto, MD as PCP - General (Internal Medicine) Inda Castle, MD as Consulting Physician (Gastroenterology) Rexene Edison, MD as Consulting Physician (Radiation Oncology) Clent Jacks, MD as Consulting Physician (Ophthalmology)  Medication Review: Medication Sig  . amLODipine-olmesartan (AZOR) 5-40 MG per tablet Take 1 tablet by mouth daily.  Marland Kitchen aspirin 81 MG tablet Take 81 mg by mouth daily.  .  B Complex Vitamins (VITAMIN B-COMPLEX PO) Take 1 tablet by mouth daily.  Marland Kitchen VITAMIN D 2000 UNITS CAPS Take 2 capsules by mouth daily.  . CRESTOR 40 MG tablet Take 1 tablet by mouth at  bedtime  . FENOFIBRATE PO Take 1 capsule by mouth daily. Takes 134 mg daily  . Magnesium 400 MG CAPS Take 2 capsules by mouth daily.  . Omega-3 Fatty Acids (FISH OIL PO) Take by mouth.  Marland Kitchen omeprazole (PRILOSEC) 20 MG capsule Take 20 mg by mouth daily.  . rosuvastatin (CRESTOR) 20 MG tablet Take 20 mg by mouth daily.  . vitamin E 1000 UNIT capsule Take 1,000 Units by mouth daily.   Allergies  Allergen Reactions  . Lipitor [Atorvastatin]   . Niacin And Related     Flushing  . Penicillins Rash   Past Medical History  Diagnosis Date  . Adenomatous colon polyp   . History of basal cell cancer 2012  . 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 GERD (gastroesophageal reflux disease)   . Hyperlipidemia   . Hypertension   . Prediabetes   . Vitamin D deficiency    Past Surgical History  Procedure Laterality Date  . Prostate biopsy  12/19/2011    Procedure: BIOPSY TRANSRECTAL ULTRASONIC PROSTATE (TUBP);  Surgeon: Molli Hazard, MD;  Location[facility];  Service: Urology;  Laterality: N/A;    . Colonoscopy w/ polypectomy      Adenomatous  . Radioactive seed implant N/A 04/04/2012    Procedure: RADIOACTIVE SEED IMPLANT;  Surgeon: Molli Hazard, MD;  Location: [facility];  Service: Urology;  Laterality: N/A;  Seeds Implanted     72  Seeds Found in Bladder     None   . Cystoscopy N/A 04/04/2012    Procedure: CYSTOSCOPY;  Surgeon: Molli Hazard, MD;  Location: [facility];  Service: Urology;  Laterality: N/A;   Current Problems (verified) Patient Active Problem List   Diagnosis Date Noted  . Testosterone deficiency 12/06/2013  . Medication management 05/01/2013  . GERD (gastroesophageal reflux  disease)   . Hyperlipidemia   . Hypertension   . Prediabetes   . Vitamin D deficiency   . Prostate cancer 12/19/2011   Screening Tests Health Maintenance  Topic Date Due  . TETANUS/TDAP  10/31/1964  . PNEUMOCOCCAL POLYSACCHARIDE VACCINE AGE 18 AND OVER  11/01/2010  . INFLUENZA VACCINE  09/07/2013  . COLONOSCOPY  12/01/2021  . ZOSTAVAX  Completed   Immunization History  Administered Date(s) Administered  . DT 10/20/2011  . Zoster 10/20/2011   Preventative care: Last colonoscopy: 12/02/2011 (10 yr f/u)  Prior vaccinations: DT: 10/20/2011  Influenza: declined   Pneumococcal: declined Shingles/Zostavax: 10/20/2011  History reviewed: allergies, current medications, past family history, past medical history, past social history, past surgical history and problem list  Risk Factors: Tobacco History  Substance Use Topics  . Smoking status: Former Smoker -- 1.00 packs/day for 20 years    Types: Cigarettes    Quit date: 03/30/2010  . Smokeless tobacco: Never Used  . Alcohol Use: 21.6 oz/week    36 Cans of beer per week     Comment: 4-6 beers daily   He does not smoke.  Patient is a former smoker. Are there smokers in your  home (other than you)?  No  Alcohol Current alcohol use: 3 beers per day(s)  Caffeine Current caffeine use: coffee decaf every other day  Exercise Current exercise: walking and yard work  Nutrition/Diet Current diet: in general, a "healthy" diet    Cardiac risk factors: advanced age (older than 8 for men, 15 for women), dyslipidemia, hypertension, male gender and smoking/ tobacco exposure.  Depression Screen (Note: if answer to either of the following is "Yes", a more complete depression screening is indicated)   Q1: Over the past two weeks, have you felt down, depressed or hopeless? No  Q2: Over the past two weeks, have you felt little interest or pleasure in doing things? No  Have you lost interest or pleasure in daily life? No  Do you often  feel hopeless? No  Do you cry easily over simple problems? No  Activities of Daily Living In your present state of health, do you have any difficulty performing the following activities?:  Driving? No Managing money?  No Feeding yourself? No Getting from bed to chair? No Climbing a flight of stairs? No Preparing food and eating?: No Bathing or showering? No Getting dressed: No Getting to the toilet? No Using the toilet:No Moving around from place to place: No In the past year have you fallen or had a near fall?:No   Are you sexually active?  Yes  Do you have more than one partner?  No  Vision Difficulties: No  Hearing Difficulties: No Do you often ask people to speak up or repeat themselves? No Do you experience ringing or noises in your ears? No Do you have difficulty understanding soft or whispered voices? No  Cognition  Do you feel that you have a problem with memory?No  Do you often misplace items? No  Do you feel safe at home?  Yes  Advanced directives Does patient have a [place]? No Does patient have a Living Will? No (Offered forms)   ROS  In addition to the HPI above,  No Fever-chills,  No Headache, No changes with Vision or hearing,  No problems swallowing food or Liquids,  No Chest pain or productive Cough or Shortness of Breath,  No Abdominal pain, No Nausea or Vomitting, Bowel movements are regular,  No Blood in stool or Urine,  No dysuria,  No new skin rashes or bruises,  No new joints pains-aches,  No new weakness, tingling, numbness in any extremity,  No recent weight loss,  No polyuria, polydypsia or polyphagia,  No significant Mental Stressors.  A full 10 point Review of Systems was done, except as stated above, all other Review of Systems were negative  Objective:    BP 124/82   Pulse 72  Temp 98.4 F   Resp 16  Ht 6'   Wt 190 lb 3.2 oz     BMI 25.79  General appearance: alert, no distress, WD/WN, male    Cognitive Testing  Alert? Yes  Normal Appearance? Yes  Oriented to person? Yes  Place? Yes   Time? Yes  Recall of three objects?  Yes  Can perform simple calculations? Yes  Displays appropriate judgment? Yes  Can read the correct time from a watch/clock? Yes  HEENT: normocephalic, sclerae anicteric, TMs pearly, nares patent, no discharge or erythema, pharynx normal Oral cavity: MMM, no lesions Neck: supple, no lymphadenopathy, no thyromegaly, no masses Heart: RRR, normal S1, S2, no murmurs Lungs: CTA bilaterally, no wheezes, rhonchi, or rales Abdomen: +bs, soft, non  tender, non distended, no masses, no hepatomegaly, no splenomegaly Musculoskeletal: nontender, no swelling, no obvious deformity Extremities: no edema, no cyanosis, no clubbing Pulses: 2+ symmetric, upper and lower extremities, normal cap refill Neurological: alert, oriented x 3, CN2-12 intact, strength normal upper extremities and lower extremities, sensation normal throughout, DTRs 2+ throughout, no cerebellar signs, gait normal Psychiatric: normal affect, behavior normal, pleasant   Medicare Attestation I have personally reviewed: The patient's medical and social history Their use of alcohol, tobacco or illicit drugs Their current medications and supplements The patient's functional ability including ADLs,fall risks, home safety risks, cognitive, and hearing and visual impairment Diet and physical activities Evidence for depression or mood disorders  The patient's weight, height, BMI, and visual acuity have been recorded in the chart.  I have made referrals, counseling, and provided education to the patient based on review of the above and I have provided the patient with a written personalized care plan for preventive services.    Juan Pinnix DAVID, MD   03/20/2014

## 2014-03-20 NOTE — Patient Instructions (Signed)

## 2014-03-21 LAB — HEMOGLOBIN A1C
Hgb A1c MFr Bld: 5.2 % (ref <5.7)
MEAN PLASMA GLUCOSE: 103 mg/dL (ref <117)

## 2014-03-21 LAB — INSULIN, FASTING: Insulin fasting, serum: 4.3 u[IU]/mL (ref 2.0–19.6)

## 2014-03-21 LAB — VITAMIN D 25 HYDROXY (VIT D DEFICIENCY, FRACTURES): Vit D, 25-Hydroxy: 33 ng/mL (ref 30–100)

## 2014-04-30 ENCOUNTER — Telehealth: Payer: Self-pay | Admitting: *Deleted

## 2014-04-30 NOTE — Telephone Encounter (Signed)
Patient called and requested the name of a different dermatologist to evaluate skin.  Per Dr Melford Aase, patient advised to try Dr Griselda Miner and he will call to schedule his appointment.

## 2014-06-19 ENCOUNTER — Ambulatory Visit: Payer: Self-pay | Admitting: Internal Medicine

## 2014-06-25 ENCOUNTER — Encounter: Payer: Self-pay | Admitting: Internal Medicine

## 2014-06-25 ENCOUNTER — Ambulatory Visit (INDEPENDENT_AMBULATORY_CARE_PROVIDER_SITE_OTHER): Payer: Medicare Other | Admitting: Internal Medicine

## 2014-06-25 VITALS — BP 136/70 | HR 78 | Temp 98.0°F | Resp 16 | Ht 72.0 in | Wt 186.0 lb

## 2014-06-25 DIAGNOSIS — E785 Hyperlipidemia, unspecified: Secondary | ICD-10-CM

## 2014-06-25 DIAGNOSIS — I10 Essential (primary) hypertension: Secondary | ICD-10-CM

## 2014-06-25 DIAGNOSIS — R7309 Other abnormal glucose: Secondary | ICD-10-CM

## 2014-06-25 DIAGNOSIS — Z79899 Other long term (current) drug therapy: Secondary | ICD-10-CM

## 2014-06-25 DIAGNOSIS — E559 Vitamin D deficiency, unspecified: Secondary | ICD-10-CM

## 2014-06-25 DIAGNOSIS — R7303 Prediabetes: Secondary | ICD-10-CM

## 2014-06-25 LAB — HEPATIC FUNCTION PANEL
ALT: 22 U/L (ref 0–53)
AST: 22 U/L (ref 0–37)
Albumin: 4.5 g/dL (ref 3.5–5.2)
Alkaline Phosphatase: 30 U/L — ABNORMAL LOW (ref 39–117)
BILIRUBIN DIRECT: 0.2 mg/dL (ref 0.0–0.3)
BILIRUBIN INDIRECT: 0.7 mg/dL (ref 0.2–1.2)
TOTAL PROTEIN: 6.7 g/dL (ref 6.0–8.3)
Total Bilirubin: 0.9 mg/dL (ref 0.2–1.2)

## 2014-06-25 LAB — CBC WITH DIFFERENTIAL/PLATELET
BASOS ABS: 0 10*3/uL (ref 0.0–0.1)
BASOS PCT: 1 % (ref 0–1)
EOS ABS: 0.1 10*3/uL (ref 0.0–0.7)
Eosinophils Relative: 2 % (ref 0–5)
HCT: 46.6 % (ref 39.0–52.0)
HEMOGLOBIN: 16.5 g/dL (ref 13.0–17.0)
Lymphocytes Relative: 26 % (ref 12–46)
Lymphs Abs: 1.2 10*3/uL (ref 0.7–4.0)
MCH: 35.4 pg — AB (ref 26.0–34.0)
MCHC: 35.4 g/dL (ref 30.0–36.0)
MCV: 100 fL (ref 78.0–100.0)
MPV: 10.1 fL (ref 8.6–12.4)
Monocytes Absolute: 0.4 10*3/uL (ref 0.1–1.0)
Monocytes Relative: 9 % (ref 3–12)
NEUTROS ABS: 2.9 10*3/uL (ref 1.7–7.7)
Neutrophils Relative %: 62 % (ref 43–77)
Platelets: 223 10*3/uL (ref 150–400)
RBC: 4.66 MIL/uL (ref 4.22–5.81)
RDW: 12.9 % (ref 11.5–15.5)
WBC: 4.6 10*3/uL (ref 4.0–10.5)

## 2014-06-25 LAB — BASIC METABOLIC PANEL WITH GFR
BUN: 23 mg/dL (ref 6–23)
CALCIUM: 9.8 mg/dL (ref 8.4–10.5)
CO2: 27 mEq/L (ref 19–32)
CREATININE: 1.55 mg/dL — AB (ref 0.50–1.35)
Chloride: 105 mEq/L (ref 96–112)
GFR, EST NON AFRICAN AMERICAN: 45 mL/min — AB
GFR, Est African American: 52 mL/min — ABNORMAL LOW
Glucose, Bld: 97 mg/dL (ref 70–99)
Potassium: 4.6 mEq/L (ref 3.5–5.3)
Sodium: 140 mEq/L (ref 135–145)

## 2014-06-25 LAB — LIPID PANEL
CHOL/HDL RATIO: 4.4 ratio
Cholesterol: 216 mg/dL — ABNORMAL HIGH (ref 0–200)
HDL: 49 mg/dL (ref >=40)
LDL CALC: 125 mg/dL — AB (ref 0–99)
Triglycerides: 212 mg/dL — ABNORMAL HIGH (ref <150)
VLDL: 42 mg/dL — ABNORMAL HIGH (ref 0–40)

## 2014-06-25 LAB — HEMOGLOBIN A1C
HEMOGLOBIN A1C: 5.5 % (ref <5.7)
Mean Plasma Glucose: 111 mg/dL (ref <117)

## 2014-06-25 LAB — MAGNESIUM: MAGNESIUM: 2.1 mg/dL (ref 1.5–2.5)

## 2014-06-25 NOTE — Progress Notes (Signed)
Patient ID: Juan Torres, male   DOB: [birth year], 69 y.o.   MRN: [id number]  Assessment and Plan:  Hypertension:  -Continue medication,  -monitor blood pressure at home.  -Continue DASH diet.   -Reminder to go to the ER if any CP, SOB, nausea, dizziness, severe HA, changes vision/speech, left arm numbness and tingling, and jaw pain.  Cholesterol: -Continue diet and exercise.  -Check cholesterol.   Pre-diabetes: -Continue diet and exercise.  -Check A1C  Vitamin D Def: -check level -continue medications.   Continue diet and meds as discussed. Further disposition pending results of labs.  HPI 69 y.o. male  presents for 3 month follow up with hypertension, hyperlipidemia, prediabetes and vitamin D.   His blood pressure has been controlled at home, today their BP is BP: 136/70 mmHg.   He does workout.  He does a lot a walking now and pushes his grandson's stroller.  He denies chest pain, shortness of breath, dizziness.   He is on cholesterol medication and denies myalgias. His cholesterol is not at goal. The cholesterol last visit was:   Lab Results  Component Value Date   CHOL 216* 03/20/2014   HDL 61 03/20/2014   LDLCALC NOT CALC 03/20/2014   TRIG 440* 03/20/2014   CHOLHDL 3.5 03/20/2014     He has been working on diet and exercise for prediabetes, and denies foot ulcerations, hyperglycemia, hypoglycemia , increased appetite, nausea, paresthesia of the feet, polydipsia, polyuria, visual disturbances, vomiting and weight loss. Last A1C in the office was:  Lab Results  Component Value Date   HGBA1C 5.2 03/20/2014    Patient is on Vitamin D supplement.  Lab Results  Component Value Date   VD25OH 33 03/20/2014     Patient does report that his eye doctor has told him that he has a cataract and has also given him some glasses which has helped tremendously.    Current Medications:  Current Outpatient Prescriptions on File Prior to Visit  Medication Sig Dispense Refill  .  aspirin 81 MG tablet Take 81 mg by mouth daily.    . AZOR 5-40 MG per tablet Take 1 tablet by mouth  every morning for blood  pressure 90 tablet 1  . B Complex Vitamins (VITAMIN B-COMPLEX PO) Take 1 tablet by mouth daily.    . Cholecalciferol (VITAMIN D) 2000 UNITS CAPS Take 2 capsules by mouth daily.    . CRESTOR 40 MG tablet Take 1 tablet by mouth at  bedtime 90 tablet 1  . fenofibrate micronized (LOFIBRA) 134 MG capsule Take 1 capsule by mouth  every day 90 capsule 1  . Magnesium 400 MG CAPS Take 2 capsules by mouth daily.    . Omega-3 Fatty Acids (FISH OIL PO) Take by mouth.    Marland Kitchen omeprazole (PRILOSEC) 20 MG capsule Take 1 capsule by mouth  every day for acid reflux 90 capsule 1  . vitamin E 1000 UNIT capsule Take 1,000 Units by mouth daily.     No current facility-administered medications on file prior to visit.    Medical History:  Past Medical History  Diagnosis Date  . Adenomatous colon polyp   . History of basal cell cancer 2012  . 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 GERD (gastroesophageal reflux disease)   . Hyperlipidemia   . Hypertension   . Prediabetes   . Vitamin D deficiency     Allergies:  Allergies  Allergen Reactions  . Lipitor [Atorvastatin]   .  Niacin And Related     Flushing  . Penicillins Rash     Review of Systems:  Review of Systems  Constitutional: Negative for fever, chills and malaise/fatigue.  HENT: Negative for congestion, ear pain and sore throat.   Eyes: Negative.   Respiratory: Negative for cough, shortness of breath and wheezing.   Cardiovascular: Negative for chest pain, palpitations and leg swelling.  Gastrointestinal: Positive for heartburn. Negative for nausea, vomiting, diarrhea, constipation, blood in stool and melena.  Genitourinary: Negative.   Skin: Negative.   Neurological: Negative for dizziness, sensory change, loss of consciousness and headaches.  Psychiatric/Behavioral:  Negative for depression. The patient is not nervous/anxious and does not have insomnia.     Family history- Review and unchanged  Social history- Review and unchanged  Physical Exam: BP 136/70 mmHg  Pulse 78  Temp(Src) 98 F (36.7 C) (Temporal)  Resp 16  Ht 6' (1.829 m)  Wt 186 lb (84.369 kg)  BMI 25.22 kg/m2 Wt Readings from Last 3 Encounters:  06/25/14 186 lb (84.369 kg)  03/20/14 190 lb 3.2 oz (86.274 kg)  12/06/13 190 lb 8 oz (86.41 kg)    General Appearance: Well nourished well developed, in no apparent distress. Eyes: PERRLA, EOMs, conjunctiva no swelling or erythema ENT/Mouth: Ear canals normal without obstruction, swelling, erythma, discharge.  TMs normal bilaterally.  Oropharynx moist, clear, without exudate, or postoropharyngeal swelling. Neck: Supple, thyroid normal,no cervical adenopathy  Respiratory: Respiratory effort normal, Breath sounds clear A&P without rhonchi, wheeze, or rale.  No retractions, no accessory usage. Cardio: RRR with no MRGs. Brisk peripheral pulses without edema.  Abdomen: Soft, + BS,  Non tender, no guarding, rebound, hernias, masses. Musculoskeletal: Full ROM, 5/5 strength, Normal gait Skin: Redness in the face and small brown scaling lesion on the right cheek with no drainage or ulceration.  Warm, dry without rashes, lesions, ecchymosis.  Neuro: Awake and oriented X 3, Cranial nerves intact. Normal muscle tone, no cerebellar symptoms. Psych: Normal affect, Insight and Judgment appropriate.    FORCUCCI, Jakeya Gherardi, PA-C 11:27 AM [facility]

## 2014-06-25 NOTE — Patient Instructions (Signed)

## 2014-06-26 LAB — INSULIN, RANDOM: INSULIN: 7.2 u[IU]/mL (ref 2.0–19.6)

## 2014-06-29 LAB — VITAMIN D 1,25 DIHYDROXY
VITAMIN D3 1, 25 (OH): 61 pg/mL
Vitamin D 1, 25 (OH)2 Total: 61 pg/mL (ref 18–72)

## 2014-07-12 ENCOUNTER — Other Ambulatory Visit: Payer: Self-pay | Admitting: Internal Medicine

## 2014-07-21 ENCOUNTER — Other Ambulatory Visit: Payer: Self-pay | Admitting: Internal Medicine

## 2014-12-17 ENCOUNTER — Ambulatory Visit (INDEPENDENT_AMBULATORY_CARE_PROVIDER_SITE_OTHER): Payer: Medicare Other | Admitting: Internal Medicine

## 2014-12-17 ENCOUNTER — Encounter: Payer: Self-pay | Admitting: Internal Medicine

## 2014-12-17 VITALS — BP 114/60 | HR 68 | Temp 98.6°F | Resp 16 | Ht 72.0 in | Wt 190.0 lb

## 2014-12-17 DIAGNOSIS — Z9181 History of falling: Secondary | ICD-10-CM

## 2014-12-17 DIAGNOSIS — Z6824 Body mass index (BMI) 24.0-24.9, adult: Secondary | ICD-10-CM | POA: Insufficient documentation

## 2014-12-17 DIAGNOSIS — Z0001 Encounter for general adult medical examination with abnormal findings: Secondary | ICD-10-CM

## 2014-12-17 DIAGNOSIS — Z0000 Encounter for general adult medical examination without abnormal findings: Secondary | ICD-10-CM | POA: Diagnosis not present

## 2014-12-17 DIAGNOSIS — K219 Gastro-esophageal reflux disease without esophagitis: Secondary | ICD-10-CM

## 2014-12-17 DIAGNOSIS — E349 Endocrine disorder, unspecified: Secondary | ICD-10-CM

## 2014-12-17 DIAGNOSIS — Z1389 Encounter for screening for other disorder: Secondary | ICD-10-CM | POA: Diagnosis not present

## 2014-12-17 DIAGNOSIS — E559 Vitamin D deficiency, unspecified: Secondary | ICD-10-CM

## 2014-12-17 DIAGNOSIS — Z789 Other specified health status: Secondary | ICD-10-CM

## 2014-12-17 DIAGNOSIS — Z1331 Encounter for screening for depression: Secondary | ICD-10-CM

## 2014-12-17 DIAGNOSIS — C61 Malignant neoplasm of prostate: Secondary | ICD-10-CM

## 2014-12-17 DIAGNOSIS — E785 Hyperlipidemia, unspecified: Secondary | ICD-10-CM

## 2014-12-17 DIAGNOSIS — Z125 Encounter for screening for malignant neoplasm of prostate: Secondary | ICD-10-CM

## 2014-12-17 DIAGNOSIS — Z79899 Other long term (current) drug therapy: Secondary | ICD-10-CM

## 2014-12-17 DIAGNOSIS — R7303 Prediabetes: Secondary | ICD-10-CM

## 2014-12-17 DIAGNOSIS — Z1212 Encounter for screening for malignant neoplasm of rectum: Secondary | ICD-10-CM

## 2014-12-17 DIAGNOSIS — Z6825 Body mass index (BMI) 25.0-25.9, adult: Secondary | ICD-10-CM

## 2014-12-17 DIAGNOSIS — I10 Essential (primary) hypertension: Secondary | ICD-10-CM | POA: Diagnosis not present

## 2014-12-17 DIAGNOSIS — Z6823 Body mass index (BMI) 23.0-23.9, adult: Secondary | ICD-10-CM | POA: Insufficient documentation

## 2014-12-17 LAB — CBC WITH DIFFERENTIAL/PLATELET
Basophils Absolute: 0 10*3/uL (ref 0.0–0.1)
Basophils Relative: 0 % (ref 0–1)
Eosinophils Absolute: 0.1 10*3/uL (ref 0.0–0.7)
Eosinophils Relative: 2 % (ref 0–5)
HEMATOCRIT: 44.1 % (ref 39.0–52.0)
HEMOGLOBIN: 16.1 g/dL (ref 13.0–17.0)
Lymphocytes Relative: 27 % (ref 12–46)
Lymphs Abs: 1.2 10*3/uL (ref 0.7–4.0)
MCH: 35.3 pg — ABNORMAL HIGH (ref 26.0–34.0)
MCHC: 36.5 g/dL — AB (ref 30.0–36.0)
MCV: 96.7 fL (ref 78.0–100.0)
MPV: 9.6 fL (ref 8.6–12.4)
Monocytes Absolute: 0.4 10*3/uL (ref 0.1–1.0)
Monocytes Relative: 9 % (ref 3–12)
NEUTROS ABS: 2.7 10*3/uL (ref 1.7–7.7)
Neutrophils Relative %: 62 % (ref 43–77)
Platelets: 225 10*3/uL (ref 150–400)
RBC: 4.56 MIL/uL (ref 4.22–5.81)
RDW: 12.8 % (ref 11.5–15.5)
WBC: 4.4 10*3/uL (ref 4.0–10.5)

## 2014-12-17 LAB — LIPID PANEL
CHOL/HDL RATIO: 3.6 ratio (ref <=5.0)
CHOLESTEROL: 196 mg/dL (ref 125–200)
HDL: 54 mg/dL (ref >=40)
LDL Cholesterol: 111 mg/dL (ref <130)
TRIGLYCERIDES: 157 mg/dL — AB (ref <150)
VLDL: 31 mg/dL — AB (ref <30)

## 2014-12-17 LAB — HEPATIC FUNCTION PANEL
ALT: 15 U/L (ref 9–46)
AST: 18 U/L (ref 10–35)
Albumin: 4.2 g/dL (ref 3.6–5.1)
Alkaline Phosphatase: 31 U/L — ABNORMAL LOW (ref 40–115)
BILIRUBIN DIRECT: 0.1 mg/dL (ref <=0.2)
BILIRUBIN INDIRECT: 0.3 mg/dL (ref 0.2–1.2)
BILIRUBIN TOTAL: 0.4 mg/dL (ref 0.2–1.2)
TOTAL PROTEIN: 6.4 g/dL (ref 6.1–8.1)

## 2014-12-17 LAB — HEMOGLOBIN A1C
Hgb A1c MFr Bld: 5.7 % — ABNORMAL HIGH (ref <5.7)
MEAN PLASMA GLUCOSE: 117 mg/dL — AB (ref <117)

## 2014-12-17 LAB — BASIC METABOLIC PANEL WITH GFR
BUN: 24 mg/dL (ref 7–25)
CALCIUM: 10.2 mg/dL (ref 8.6–10.3)
CO2: 25 mmol/L (ref 20–31)
Chloride: 108 mmol/L (ref 98–110)
Creat: 1.71 mg/dL — ABNORMAL HIGH (ref 0.70–1.25)
GFR, EST NON AFRICAN AMERICAN: 40 mL/min — AB (ref >=60)
GFR, Est African American: 46 mL/min — ABNORMAL LOW (ref >=60)
GLUCOSE: 106 mg/dL — AB (ref 65–99)
POTASSIUM: 4.3 mmol/L (ref 3.5–5.3)
Sodium: 144 mmol/L (ref 135–146)

## 2014-12-17 LAB — TSH: TSH: 2.615 u[IU]/mL (ref 0.350–4.500)

## 2014-12-17 LAB — MAGNESIUM: Magnesium: 2 mg/dL (ref 1.5–2.5)

## 2014-12-17 NOTE — Progress Notes (Signed)
Patient ID: Juan Torres, male   DOB: August 26, 1945, 69 y.o.   MRN: [id number]  Annual  Screening/Preventative  Visit And Comprehensive  Evaluation  & Examination     This very nice 69 y.o. MWM presents for presents for a Wellness/Preventative Visit & comprehensive evaluation and management of multiple medical co-morbidities.  Patient has been followed for HTN, Prediabetes, Hyperlipidemia, and Vitamin D Deficiency. Patient also has hx/o Ca prostate treated in 2014 by Brachytherapy by Dr's Scheryl Darter. Patient also has GERD which is quiescent on his current med regimen. Patient als relates he is scheduled for a Rt CE/IOL implant in the Am and to follow in 3 weeks onj the Right.      HTN predates since 1998, altho treatment wasn't started until 2006. 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 . Patient's BP has been controlled at home.Today's BP: 114/60 mmHg. Patient denies any cardiac symptoms as chest pain, palpitations, shortness of breath, dizziness or ankle swelling.     Patient's hyperlipidemia is controlled with diet and medications. Patient denies myalgias or other medication SE's. Last lipids were still not at goal with  Cholesterol 196; HDL 54; LDL 111; Triglycerides 157 on 12/17/2014.      Patient has prediabetes since 2014 with A1c 5.7%  and patient denies reactive hypoglycemic symptoms, visual blurring, diabetic polys or paresthesias. Last A1c was 5.5% on 06/25/2014.       Finally, patient has history of Vitamin D Deficiency of 33 in 2013 and last vitamin D was still very low at 33 on 03/20/2014.  Medication Sig  . aspirin 81 MG tablet Take 81 mg by mouth daily.  . AZOR 5-40 MG per tablet Take 1 tablet by mouth  every morning for blood  pressure  . B Complex Vitamins (VITAMIN B-COMPLEX PO) Take 1 tablet by mouth daily.  . Cholecalciferol (VITAMIN D) 2000 UNITS CAPS Take 2 capsules by mouth daily.  . CRESTOR 40 MG tablet Take 1 tablet by mouth at  bedtime  .  fenofibrate micronized (LOFIBRA) 134 MG capsule Take 1 capsule by mouth  every day  . Magnesium 400 MG CAPS Take 2 capsules by mouth daily.  . Omega-3 Fatty Acids (FISH OIL PO) Take by mouth.  Marland Kitchen omeprazole (PRILOSEC) 20 MG capsule Take 1 capsule by mouth  every day for acid reflux  . vitamin E 1000 UNIT capsule Take 1,000 Units by mouth daily.   No current facility-administered medications on file prior to visit.   Allergies  Allergen Reactions  . Lipitor [Atorvastatin]   . Niacin And Related     Flushing  . Penicillins Rash   Past Medical History  Diagnosis Date  . Adenomatous colon polyp   . History of basal cell cancer 2012  . Prostate cancer ([place]) DX 12/19/11    bx=Adenocarcinoma,gleason=3+3=6,volume=42cc,PSA=4.63  . GERD (gastroesophageal reflux disease)   . Hyperlipidemia   . Hypertension   . Prediabetes   . Vitamin D deficiency    Health Maintenance  Topic Date Due  . Hepatitis C Screening  04/02/1945  . TETANUS/TDAP  10/31/1964  . PNA vac Low Risk Adult (1 of 2 - PCV13) 11/01/2010  . INFLUENZA VACCINE  09/08/2014  . COLONOSCOPY  12/01/2021  . ZOSTAVAX  Completed   Immunization History  Administered Date(s) Administered  . DT 10/20/2011  . Zoster 10/20/2011   Past Surgical History  Procedure Laterality Date  . Prostate biopsy  12/19/2011    Procedure: BIOPSY  TRANSRECTAL ULTRASONIC PROSTATE (TUBP);  Surgeon: Molli Hazard, MD;  Location: [facility];  Service: Urology;  Laterality: N/A;    . Colonoscopy w/ polypectomy      Adenomatous  . Radioactive seed implant N/A 04/04/2012    Procedure: RADIOACTIVE SEED IMPLANT;  Surgeon: Molli Hazard, MD;  Location: [facility];  Service: Urology;  Laterality: N/A;  Seeds Implanted     72  Seeds Found in Bladder     None   . Cystoscopy N/A 04/04/2012    Procedure: CYSTOSCOPY;  Surgeon: Molli Hazard, MD;  Location: [facility];  Service: Urology;   Laterality: N/A;   Family History  Problem Relation Age of Onset  . Breast cancer Mother   . Cancer Maternal Grandfather     colon  . Cancer Maternal Uncle     prostate  . Cancer Cousin     prostyae cancer 1st maternal     Social History   Social History  . Marital Status: Married    Spouse Name: N/A  . Number of Children: 2  . Years of Education: N/A   Occupational History  . Not on file.   Social History Main Topics  . Smoking status: Former Smoker -- 1.00 packs/day for 20 years    Types: Cigarettes    Quit date: 03/30/2010  . Smokeless tobacco: Never Used  . Alcohol Use: 21.6 oz/week    36 Cans of beer per week     Comment: 4-6 beers daily  . Drug Use: 6.00 per week    Special: Marijuana     Comment: / marijuana  . Sexual Activity: Not on file   Other Topics Concern  . Not on file   Social History Narrative    ROS Constitutional: Denies fever, chills, weight loss/gain, headaches, insomnia,  night sweats or change in appetite. Does c/o fatigue. Eyes: Denies redness, blurred vision, diplopia, discharge, itchy or watery eyes.  ENT: Denies discharge, congestion, post nasal drip, epistaxis, sore throat, earache, hearing loss, dental pain, Tinnitus, Vertigo, Sinus pain or snoring.  Cardio: Denies chest pain, palpitations, irregular heartbeat, syncope, dyspnea, diaphoresis, orthopnea, PND, claudication or edema Respiratory: denies cough, dyspnea, DOE, pleurisy, hoarseness, laryngitis or wheezing.  Gastrointestinal: Denies dysphagia, heartburn, reflux, water brash, pain, cramps, nausea, vomiting, bloating, diarrhea, constipation, hematemesis, melena, hematochezia, jaundice or hemorrhoids Genitourinary: Denies dysuria, frequency, urgency, nocturia, hesitancy, discharge, hematuria or flank pain Musculoskeletal: Denies arthralgia, myalgia, stiffness, Jt. Swelling, pain, limp or strain/sprain. Denies Falls. Skin: Denies puritis, rash, hives, warts, acne, eczema or change in  skin lesion Neuro: No weakness, tremor, incoordination, spasms, paresthesia or pain Psychiatric: Denies confusion, memory loss or sensory loss. Denies Depression. Endocrine: Denies change in weight, skin, hair change, nocturia, and paresthesia, diabetic polys, visual blurring or hyper / hypo glycemic episodes.  Heme/Lymph: No excessive bleeding, bruising or enlarged lymph nodes.  Physical Exam  BP 114/60 mmHg  Pulse 68  Temp(Src) 98.6 F (37 C) (Temporal)  Resp 16  Ht 6' (1.829 m)  Wt 190 lb (86.183 kg)  BMI 25.76 kg/m2  General Appearance: Well nourished, in no apparent distress. Eyes: PERRLA, EOMs, conjunctiva no swelling or erythema, normal fundi and vessels. Sinuses: No frontal/maxillary tenderness ENT/Mouth: EACs patent / TMs  nl. Nares clear without erythema, swelling, mucoid exudates. Oral hygiene is good. No erythema, swelling, or exudate. Tongue normal, non-obstructing. Tonsils not swollen or erythematous. Hearing normal.  Neck: Supple, thyroid normal. No bruits, nodes or JVD. Respiratory: Respiratory  effort normal.  BS equal and clear bilateral without rales, rhonci, wheezing or stridor. Cardio: Heart sounds are normal with regular rate and rhythm and no murmurs, rubs or gallops. Peripheral pulses are normal and equal bilaterally without edema. No aortic or femoral bruits. Chest: symmetric with normal excursions and percussion.  Abdomen: Flat, soft, with bowl sounds. Nontender, no guarding, rebound, hernias, masses, or organomegaly.  Lymphatics: Non tender without lymphadenopathy.  Genitourinary: No hernias.Testes nl. DRE - prostate nl for age - smooth & firm w/o nodules. Musculoskeletal: Full ROM all peripheral extremities, joint stability, 5/5 strength, and normal gait. Skin: Warm and dry without rashes, lesions, cyanosis, clubbing or  ecchymosis.  Neuro: Cranial nerves intact, reflexes equal bilaterally. Normal muscle tone, no cerebellar symptoms. Sensation intact.  Pysch:  Awake and oriented X 3 with normal affect, insight and judgment appropriate.   Assessment and Plan  1. Annual Preventative Visit   - EKG 12-Lead - [place], RETROPERITNL ABD,  LTD - POC Hemoccult Bld/Stl  - PSA - Urinalysis, Routine w reflex microscopic  - CBC with Differential/Platelet - BASIC METABOLIC PANEL WITH GFR - Hepatic function panel - Magnesium - Lipid panel - TSH - Hemoglobin A1c - Insulin, random - Vit D  25 hydroxy  2. Essential hypertension  - EKG 12-Lead - [place], RETROPERITNL ABD,  LTD - TSH  3. Hyperlipidemia  - Lipid panel  4. Prediabetes  - Hemoglobin A1c - Insulin, random  5. Vitamin D deficiency  - Vit D  25 hydroxy   6. Testosterone deficiency   7. Gastroesophageal reflux disease   8. Prostate cancer ([place])  - PSA  9. Screening for rectal cancer  - POC Hemoccult Bld/Stl   10. Prostate cancer screening  - PSA  11. Depression screen   12. At low risk for fall   13. Medication management  - Urinalysis, Routine w reflex microscopic  - CBC with Differential/Platelet - BASIC METABOLIC PANEL WITH GFR - Hepatic function panel - Magnesium   Continue prudent diet as discussed, weight control, BP monitoring, regular exercise, and medications as discussed.  Discussed med effects and SE's. Routine screening labs and tests as requested with regular follow-up as recommended.

## 2014-12-17 NOTE — Patient Instructions (Signed)

## 2014-12-18 LAB — PSA: PSA: 0.16 ng/mL (ref <=4.00)

## 2014-12-18 LAB — VITAMIN D 25 HYDROXY (VIT D DEFICIENCY, FRACTURES): Vit D, 25-Hydroxy: 49 ng/mL (ref 30–100)

## 2014-12-18 LAB — INSULIN, RANDOM: Insulin: 15.6 u[IU]/mL (ref 2.0–19.6)

## 2015-01-30 ENCOUNTER — Other Ambulatory Visit: Payer: Self-pay | Admitting: Physician Assistant

## 2015-01-30 DIAGNOSIS — E782 Mixed hyperlipidemia: Secondary | ICD-10-CM

## 2015-02-26 DIAGNOSIS — D692 Other nonthrombocytopenic purpura: Secondary | ICD-10-CM | POA: Diagnosis not present

## 2015-02-26 DIAGNOSIS — Z85828 Personal history of other malignant neoplasm of skin: Secondary | ICD-10-CM | POA: Diagnosis not present

## 2015-02-26 DIAGNOSIS — D225 Melanocytic nevi of trunk: Secondary | ICD-10-CM | POA: Diagnosis not present

## 2015-02-26 DIAGNOSIS — L821 Other seborrheic keratosis: Secondary | ICD-10-CM | POA: Diagnosis not present

## 2015-02-26 DIAGNOSIS — L814 Other melanin hyperpigmentation: Secondary | ICD-10-CM | POA: Diagnosis not present

## 2015-02-26 DIAGNOSIS — L570 Actinic keratosis: Secondary | ICD-10-CM | POA: Diagnosis not present

## 2015-02-26 DIAGNOSIS — C44622 Squamous cell carcinoma of skin of right upper limb, including shoulder: Secondary | ICD-10-CM | POA: Diagnosis not present

## 2015-02-26 DIAGNOSIS — L853 Xerosis cutis: Secondary | ICD-10-CM | POA: Diagnosis not present

## 2015-02-26 DIAGNOSIS — D1801 Hemangioma of skin and subcutaneous tissue: Secondary | ICD-10-CM | POA: Diagnosis not present

## 2015-03-20 ENCOUNTER — Ambulatory Visit (INDEPENDENT_AMBULATORY_CARE_PROVIDER_SITE_OTHER): Payer: Medicare HMO | Admitting: Internal Medicine

## 2015-03-20 ENCOUNTER — Encounter: Payer: Self-pay | Admitting: Internal Medicine

## 2015-03-20 VITALS — BP 142/80 | HR 76 | Temp 98.2°F | Resp 16 | Ht 72.0 in | Wt 190.0 lb

## 2015-03-20 DIAGNOSIS — E785 Hyperlipidemia, unspecified: Secondary | ICD-10-CM

## 2015-03-20 DIAGNOSIS — I10 Essential (primary) hypertension: Secondary | ICD-10-CM | POA: Diagnosis not present

## 2015-03-20 DIAGNOSIS — Z79899 Other long term (current) drug therapy: Secondary | ICD-10-CM | POA: Diagnosis not present

## 2015-03-20 LAB — TSH: TSH: 3.51 m[IU]/L (ref 0.40–4.50)

## 2015-03-20 LAB — BASIC METABOLIC PANEL WITH GFR
BUN: 19 mg/dL (ref 7–25)
CALCIUM: 9.9 mg/dL (ref 8.6–10.3)
CO2: 25 mmol/L (ref 20–31)
CREATININE: 1.29 mg/dL — AB (ref 0.70–1.25)
Chloride: 104 mmol/L (ref 98–110)
GFR, EST AFRICAN AMERICAN: 65 mL/min (ref >=60)
GFR, EST NON AFRICAN AMERICAN: 56 mL/min — AB (ref >=60)
GLUCOSE: 92 mg/dL (ref 65–99)
Potassium: 4.4 mmol/L (ref 3.5–5.3)
SODIUM: 140 mmol/L (ref 135–146)

## 2015-03-20 LAB — HEPATIC FUNCTION PANEL
ALT: 17 U/L (ref 9–46)
AST: 25 U/L (ref 10–35)
Albumin: 4.5 g/dL (ref 3.6–5.1)
Alkaline Phosphatase: 32 U/L — ABNORMAL LOW (ref 40–115)
BILIRUBIN DIRECT: 0.1 mg/dL (ref <=0.2)
BILIRUBIN INDIRECT: 0.8 mg/dL (ref 0.2–1.2)
TOTAL PROTEIN: 6.8 g/dL (ref 6.1–8.1)
Total Bilirubin: 0.9 mg/dL (ref 0.2–1.2)

## 2015-03-20 LAB — CBC WITH DIFFERENTIAL/PLATELET
BASOS ABS: 0 10*3/uL (ref 0.0–0.1)
BASOS PCT: 0 % (ref 0–1)
EOS ABS: 0 10*3/uL (ref 0.0–0.7)
EOS PCT: 1 % (ref 0–5)
HCT: 47.6 % (ref 39.0–52.0)
Hemoglobin: 17 g/dL (ref 13.0–17.0)
LYMPHS ABS: 1.1 10*3/uL (ref 0.7–4.0)
Lymphocytes Relative: 24 % (ref 12–46)
MCH: 35.6 pg — AB (ref 26.0–34.0)
MCHC: 35.7 g/dL (ref 30.0–36.0)
MCV: 99.6 fL (ref 78.0–100.0)
MONOS PCT: 12 % (ref 3–12)
MPV: 9.6 fL (ref 8.6–12.4)
Monocytes Absolute: 0.6 10*3/uL (ref 0.1–1.0)
Neutro Abs: 3 10*3/uL (ref 1.7–7.7)
Neutrophils Relative %: 63 % (ref 43–77)
PLATELETS: 247 10*3/uL (ref 150–400)
RBC: 4.78 MIL/uL (ref 4.22–5.81)
RDW: 13.7 % (ref 11.5–15.5)
WBC: 4.7 10*3/uL (ref 4.0–10.5)

## 2015-03-20 LAB — LIPID PANEL
CHOLESTEROL: 353 mg/dL — AB (ref 125–200)
HDL: 54 mg/dL (ref >=40)
LDL CALC: 240 mg/dL — AB (ref <130)
TRIGLYCERIDES: 294 mg/dL — AB (ref <150)
Total CHOL/HDL Ratio: 6.5 Ratio — ABNORMAL HIGH (ref <=5.0)
VLDL: 59 mg/dL — ABNORMAL HIGH (ref <30)

## 2015-03-20 MED ORDER — DICLOFENAC SODIUM 1 % TD GEL: 2.0000 g | Freq: Four times a day (QID) | TRANSDERMAL | Status: DC | PRN

## 2015-03-20 NOTE — Progress Notes (Signed)
Patient ID: Juan Torres, male   DOB: 1945-09-25, 70 y.o.   MRN: [id number]  Assessment and Plan:  Hypertension:  -Continue medication,  -monitor blood pressure at home.  -Continue DASH diet.   -Reminder to go to the ER if any CP, SOB, nausea, dizziness, severe HA, changes vision/speech, left arm numbness and tingling, and jaw pain.  Cholesterol: -Continue diet and exercise.  -Check cholesterol.   Pre-diabetes: -Continue diet and exercise.  -Check A1C  Vitamin D Def: -check level -continue medications.   Continue diet and meds as discussed. Further disposition pending results of labs.  HPI 70 y.o. male  presents for 3 month follow up with hypertension, hyperlipidemia, prediabetes and vitamin D.   His blood pressure has been controlled at home, today their BP is BP: (!) 142/80 mmHg.   He does not workout. He denies chest pain, shortness of breath, dizziness.   He is on cholesterol medication and denies myalgias. His cholesterol is at goal. The cholesterol last visit was:   Lab Results  Component Value Date   CHOL 196 12/17/2014   HDL 54 12/17/2014   LDLCALC 111 12/17/2014   TRIG 157* 12/17/2014   CHOLHDL 3.6 12/17/2014     He has been working on diet and exercise for prediabetes, and denies foot ulcerations, hyperglycemia, hypoglycemia , increased appetite, nausea, paresthesia of the feet, polydipsia, polyuria, visual disturbances, vomiting and weight loss. Last A1C in the office was:  Lab Results  Component Value Date   HGBA1C 5.7* 12/17/2014    Patient is on Vitamin D supplement.  Lab Results  Component Value Date   VD25OH 49 12/17/2014     Patient reports that he had both cataracts taken care of.  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  He reports that they got good margins on the dermatology report.    Current Medications:  Current Outpatient Prescriptions on File Prior to Visit  Medication Sig Dispense Refill  . aspirin 81 MG tablet Take 81 mg by  mouth daily.    . AZOR 5-40 MG per tablet Take 1 tablet by mouth  every morning for blood  pressure 90 tablet 1  . B Complex Vitamins (VITAMIN B-COMPLEX PO) Take 1 tablet by mouth daily.    . Cholecalciferol (VITAMIN D) 2000 UNITS CAPS Take 2 capsules by mouth daily.    . fenofibrate micronized (LOFIBRA) 134 MG capsule Take 1 capsule by mouth  every day 90 capsule 1  . Magnesium 400 MG CAPS Take 2 capsules by mouth daily.    . Omega-3 Fatty Acids (FISH OIL PO) Take by mouth.    Marland Kitchen omeprazole (PRILOSEC) 20 MG capsule Take 1 capsule by mouth  every day for acid reflux 90 capsule 1  . rosuvastatin (CRESTOR) 40 MG tablet Take 1 tablet by mouth at  bedtime 90 tablet 1  . vitamin E 1000 UNIT capsule Take 1,000 Units by mouth daily.     No current facility-administered medications on file prior to visit.    Medical History:  Past Medical History  Diagnosis Date  . Adenomatous colon polyp   . History of basal cell cancer 2012  . Prostate cancer ([place]) DX 12/19/11    bx=Adenocarcinoma,gleason=3+3=6,volume=42cc,PSA=4.63  . GERD (gastroesophageal reflux disease)   . Hyperlipidemia   . Hypertension   . Prediabetes   . Vitamin D deficiency     Allergies:  Allergies  Allergen Reactions  . Lipitor [Atorvastatin]   . Niacin And Related  Flushing  . Penicillins Rash     Review of Systems:  Review of Systems  Constitutional: Negative for fever, chills and malaise/fatigue.  HENT: Negative for congestion, ear pain and sore throat.   Respiratory: Negative for cough, shortness of breath and wheezing.   Cardiovascular: Negative for chest pain, palpitations and leg swelling.  Gastrointestinal: Negative for heartburn, abdominal pain, diarrhea, constipation, blood in stool and melena.  Genitourinary: Negative.   Musculoskeletal: Positive for joint pain.  Neurological: Negative for dizziness, sensory change, loss of consciousness and headaches.  Psychiatric/Behavioral: Negative for depression.  The patient is not nervous/anxious and does not have insomnia.     Family history- Review and unchanged  Social history- Review and unchanged  Physical Exam: BP 142/80 mmHg  Pulse 76  Temp(Src) 98.2 F (36.8 C) (Temporal)  Resp 16  Ht 6' (1.829 m)  Wt 190 lb (86.183 kg)  BMI 25.76 kg/m2 Wt Readings from Last 3 Encounters:  03/20/15 190 lb (86.183 kg)  12/17/14 190 lb (86.183 kg)  06/25/14 186 lb (84.369 kg)    General Appearance: Well nourished well developed, in no apparent distress. Eyes: PERRLA, EOMs, conjunctiva no swelling or erythema ENT/Mouth: Ear canals normal without obstruction, swelling, erythma, discharge.  TMs normal bilaterally.  Oropharynx moist, clear, without exudate, or postoropharyngeal swelling. Neck: Supple, thyroid normal,no cervical adenopathy  Respiratory: Respiratory effort normal, Breath sounds clear A&P without rhonchi, wheeze, or rale.  No retractions, no accessory usage. Cardio: RRR with no MRGs. Brisk peripheral pulses without edema.  Abdomen: Soft, + BS,  Non tender, no guarding, rebound, hernias, masses. Musculoskeletal: Full ROM, 5/5 strength, Normal gait Skin: Warm, dry without rashes, lesions, ecchymosis.  Neuro: Awake and oriented X 3, Cranial nerves intact. Normal muscle tone, no cerebellar symptoms. Psych: Normal affect, Insight and Judgment appropriate.    Starlyn Skeans, PA-C 9:25 AM [facility]

## 2015-03-20 NOTE — Addendum Note (Signed)
Addended by: Starlyn Skeans A on: 03/20/2015 09:47 AM   Modules accepted: Orders

## 2015-03-21 LAB — HEMOGLOBIN A1C
Hgb A1c MFr Bld: 5.5 % (ref <5.7)
Mean Plasma Glucose: 111 mg/dL (ref <117)

## 2015-06-16 ENCOUNTER — Other Ambulatory Visit: Payer: Self-pay | Admitting: *Deleted

## 2015-06-16 DIAGNOSIS — E782 Mixed hyperlipidemia: Secondary | ICD-10-CM

## 2015-06-16 MED ORDER — ROSUVASTATIN CALCIUM 40 MG PO TABS: ORAL_TABLET | ORAL | Status: DC

## 2015-06-16 MED ORDER — FENOFIBRATE MICRONIZED 134 MG PO CAPS: ORAL_CAPSULE | ORAL | Status: DC

## 2015-06-16 MED ORDER — AMLODIPINE-OLMESARTAN 5-40 MG PO TABS: ORAL_TABLET | ORAL | Status: DC

## 2015-06-16 MED ORDER — OMEPRAZOLE 20 MG PO CPDR: DELAYED_RELEASE_CAPSULE | ORAL | Status: DC

## 2015-06-18 ENCOUNTER — Other Ambulatory Visit: Payer: Self-pay | Admitting: *Deleted

## 2015-06-18 MED ORDER — AMLODIPINE-OLMESARTAN 5-40 MG PO TABS: ORAL_TABLET | ORAL | Status: DC

## 2015-06-23 ENCOUNTER — Encounter: Payer: Self-pay | Admitting: Internal Medicine

## 2015-06-23 ENCOUNTER — Ambulatory Visit (INDEPENDENT_AMBULATORY_CARE_PROVIDER_SITE_OTHER): Payer: Medicare HMO | Admitting: Internal Medicine

## 2015-06-23 VITALS — BP 128/82 | HR 60 | Temp 97.5°F | Resp 16 | Ht 72.0 in | Wt 182.2 lb

## 2015-06-23 DIAGNOSIS — K219 Gastro-esophageal reflux disease without esophagitis: Secondary | ICD-10-CM | POA: Diagnosis not present

## 2015-06-23 DIAGNOSIS — E291 Testicular hypofunction: Secondary | ICD-10-CM

## 2015-06-23 DIAGNOSIS — R7309 Other abnormal glucose: Secondary | ICD-10-CM | POA: Diagnosis not present

## 2015-06-23 DIAGNOSIS — I10 Essential (primary) hypertension: Secondary | ICD-10-CM | POA: Diagnosis not present

## 2015-06-23 DIAGNOSIS — E559 Vitamin D deficiency, unspecified: Secondary | ICD-10-CM | POA: Diagnosis not present

## 2015-06-23 DIAGNOSIS — E349 Endocrine disorder, unspecified: Secondary | ICD-10-CM

## 2015-06-23 DIAGNOSIS — R7303 Prediabetes: Secondary | ICD-10-CM

## 2015-06-23 DIAGNOSIS — Z79899 Other long term (current) drug therapy: Secondary | ICD-10-CM

## 2015-06-23 DIAGNOSIS — E785 Hyperlipidemia, unspecified: Secondary | ICD-10-CM

## 2015-06-23 LAB — BASIC METABOLIC PANEL WITH GFR
BUN: 23 mg/dL (ref 7–25)
CHLORIDE: 105 mmol/L (ref 98–110)
CO2: 24 mmol/L (ref 20–31)
Calcium: 9.7 mg/dL (ref 8.6–10.3)
Creat: 1.37 mg/dL — ABNORMAL HIGH (ref 0.70–1.25)
GFR, EST NON AFRICAN AMERICAN: 52 mL/min — AB (ref >=60)
GFR, Est African American: 60 mL/min (ref >=60)
GLUCOSE: 89 mg/dL (ref 65–99)
POTASSIUM: 4.5 mmol/L (ref 3.5–5.3)
SODIUM: 139 mmol/L (ref 135–146)

## 2015-06-23 LAB — HEPATIC FUNCTION PANEL
ALBUMIN: 4.3 g/dL (ref 3.6–5.1)
ALT: 19 U/L (ref 9–46)
AST: 24 U/L (ref 10–35)
Alkaline Phosphatase: 34 U/L — ABNORMAL LOW (ref 40–115)
Bilirubin, Direct: 0.2 mg/dL (ref <=0.2)
Indirect Bilirubin: 0.5 mg/dL (ref 0.2–1.2)
TOTAL PROTEIN: 6.4 g/dL (ref 6.1–8.1)
Total Bilirubin: 0.7 mg/dL (ref 0.2–1.2)

## 2015-06-23 LAB — CBC WITH DIFFERENTIAL/PLATELET
BASOS ABS: 0 {cells}/uL (ref 0–200)
Basophils Relative: 0 %
Eosinophils Absolute: 92 cells/uL (ref 15–500)
Eosinophils Relative: 2 %
HEMATOCRIT: 48.7 % (ref 38.5–50.0)
HEMOGLOBIN: 17 g/dL (ref 13.2–17.1)
LYMPHS ABS: 1150 {cells}/uL (ref 850–3900)
LYMPHS PCT: 25 %
MCH: 35.3 pg — AB (ref 27.0–33.0)
MCHC: 34.9 g/dL (ref 32.0–36.0)
MCV: 101.2 fL — ABNORMAL HIGH (ref 80.0–100.0)
MONO ABS: 552 {cells}/uL (ref 200–950)
MPV: 9.6 fL (ref 7.5–12.5)
Monocytes Relative: 12 %
NEUTROS PCT: 61 %
Neutro Abs: 2806 cells/uL (ref 1500–7800)
Platelets: 190 10*3/uL (ref 140–400)
RBC: 4.81 MIL/uL (ref 4.20–5.80)
RDW: 13 % (ref 11.0–15.0)
WBC: 4.6 10*3/uL (ref 3.8–10.8)

## 2015-06-23 LAB — LIPID PANEL
CHOL/HDL RATIO: 3.8 ratio (ref <=5.0)
Cholesterol: 225 mg/dL — ABNORMAL HIGH (ref 125–200)
HDL: 59 mg/dL (ref >=40)
LDL CALC: 115 mg/dL (ref <130)
TRIGLYCERIDES: 253 mg/dL — AB (ref <150)
VLDL: 51 mg/dL — ABNORMAL HIGH (ref <30)

## 2015-06-23 LAB — HEMOGLOBIN A1C
Hgb A1c MFr Bld: 5.3 % (ref <5.7)
Mean Plasma Glucose: 105 mg/dL

## 2015-06-23 LAB — MAGNESIUM: Magnesium: 2.1 mg/dL (ref 1.5–2.5)

## 2015-06-23 LAB — TSH: TSH: 2.82 m[IU]/L (ref 0.40–4.50)

## 2015-06-23 NOTE — Patient Instructions (Signed)

## 2015-06-23 NOTE — Progress Notes (Signed)
Patient ID: Juan Torres, male   DOB: 03-10-1945, 70 y.o.   MRN: [id number]  [facility] ADULT & ADOLESCENT INTERNAL MEDICINE                       Unk Pinto, M.D.        Uvaldo Bristle. Silverio Lay, P.A.-C       Starlyn Skeans, P.A.-C  [facility]                [address] [place] [id number] Telephone [phone] Telefax [phone] _________________________________________________________________________   This very nice 70 y.o. MWM presents for 3 month follow up with Hypertension, Hyperlipidemia, Pre-Diabetes and Vitamin D Deficiency.    Patient is treated for HTN since 1998 & BP has been controlled at home. Today's BP: 128/82 mmHg. Patient has had no complaints of any cardiac type chest pain, palpitations, dyspnea/orthopnea/PND, dizziness, claudication, or dependent edema.   Hyperlipidemia is controlled with diet & meds. Patient denies myalgias or other med SE's. Last Lipids were not at goal as patient had inadvertently been off of his Crestor for 1-2 weeks  Cholesterol 353*; HDL 54; LDL 240*; Triglycerides 294 on 03/20/2015.   Also, the patient has history of PreDiabetes since 2014 with A1c 5.7% and has had no symptoms of reactive hypoglycemia, diabetic polys, paresthesias or visual blurring.  Last A1c was  5.5%  on 03/20/2015.   Further, the patient also has history of Vitamin D Deficiency of "13" in 2013 and supplements vitamin D without any suspected side-effects. Last vitamin D was  49 on 12/17/2014.   Medication Sig  . amLODipine-olmesartan  5-40 MG Take 1 tablet by mouth  every morning for blood  pressure  . aspirin 81 MG  Take 81 mg by mouth daily.  Marland Kitchen VITAMIN B-COMPLEX Take 1 tablet by mouth daily.  Marland Kitchen VITAMIN D 2000 UNITS  Take 2 capsules by mouth daily.  . diclofenac  1 % GEL Apply 2 g topically 4 (four) times daily as needed (joint pain).  . fenofibrate  134 MG  Take 1 capsule by mouth  every day  . Magnesium 400 MG  Take  2 capsules by mouth daily.  . Omega-3 FISH OIL Take by mouth.  Marland Kitchen omeprazole  20 MG  Take 1 capsule by mouth  every day for acid reflux  . rosuvastatin 40 MG  Take 1 tablet by mouth at  bedtime  . vitamin E 1000 UNIT  Take 1,000 Units by mouth daily.   Allergies  Allergen Reactions  . Lipitor [Atorvastatin]   . Niacin And Related     Flushing  . Penicillins Rash   PMHx:   Past Medical History  Diagnosis Date  . Adenomatous colon polyp   . History of basal cell cancer 2012  . Prostate cancer ([place]) DX 12/19/11    bx=Adenocarcinoma,gleason=3+3=6,volume=42cc,PSA=4.63  . GERD (gastroesophageal reflux disease)   . Hyperlipidemia   . Hypertension   . Prediabetes   . Vitamin D deficiency    Immunization History  Administered Date(s) Administered  . DT 10/20/2011  . Zoster 10/20/2011   Past Surgical History  Procedure Laterality Date  . Prostate biopsy  12/19/2011    Procedure: BIOPSY TRANSRECTAL ULTRASONIC PROSTATE (TUBP);  Surgeon: Molli Hazard, MD;  Location[facility];  Service: Urology;  Laterality: N/A;    . Colonoscopy w/ polypectomy  Adenomatous  . Radioactive seed implant N/A 04/04/2012    Procedure: RADIOACTIVE SEED IMPLANT;  Surgeon: Molli Hazard, MD;  Location: [facility];  Service: Urology;  Laterality: N/A;  Seeds Implanted     72  Seeds Found in Bladder     None   . Cystoscopy N/A 04/04/2012    Procedure: CYSTOSCOPY;  Surgeon: Molli Hazard, MD;  Location: [facility];  Service: Urology;  Laterality: N/A;   FHx:    Reviewed / unchanged  SHx:    Reviewed / unchanged  Systems Review:  Constitutional: Denies fever, chills, wt changes, headaches, insomnia, fatigue, night sweats, change in appetite. Eyes: Denies redness, blurred vision, diplopia, discharge, itchy, watery eyes.  ENT: Denies discharge, congestion, post nasal drip, epistaxis, sore throat, earache, hearing loss, dental pain,  tinnitus, vertigo, sinus pain, snoring.  CV: Denies chest pain, palpitations, irregular heartbeat, syncope, dyspnea, diaphoresis, orthopnea, PND, claudication or edema. Respiratory: denies cough, dyspnea, DOE, pleurisy, hoarseness, laryngitis, wheezing.  Gastrointestinal: Denies dysphagia, odynophagia, heartburn, reflux, water brash, abdominal pain or cramps, nausea, vomiting, bloating, diarrhea, constipation, hematemesis, melena, hematochezia  or hemorrhoids. Genitourinary: Denies dysuria, frequency, urgency, nocturia, hesitancy, discharge, hematuria or flank pain. Musculoskeletal: Denies arthralgias, myalgias, stiffness, jt. swelling, pain, limping or strain/sprain.  Skin: Denies pruritus, rash, hives, warts, acne, eczema or change in skin lesion(s). Neuro: No weakness, tremor, incoordination, spasms, paresthesia or pain. Psychiatric: Denies confusion, memory loss or sensory loss. Endo: Denies change in weight, skin or hair change.  Heme/Lymph: No excessive bleeding, bruising or enlarged lymph nodes.  Physical Exam  BP 128/82 mmHg  Pulse 60  Temp(Src) 97.5 F (36.4 C)  Resp 16  Ht 6' (1.829 m)  Wt 182 lb 3.2 oz (82.645 kg)  BMI 24.71 kg/m2  Appears well nourished and in no distress. Ruddy facies.  Eyes: PERRLA, EOMs, conjunctiva no swelling or erythema. Sinuses: No frontal/maxillary tenderness ENT/Mouth: EAC's clear, TM's nl w/o erythema, bulging. Nares clear w/o erythema, swelling, exudates. Oropharynx clear without erythema or exudates. Oral hygiene is good. Tongue normal, non obstructing. Hearing intact.  Neck: Supple. Thyroid nl. Car 2+/2+ without bruits, nodes or JVD. Chest: Respirations nl with BS clear & equal w/o rales, rhonchi, wheezing or stridor.  Cor: Heart sounds normal w/ regular rate and rhythm without sig. murmurs, gallops, clicks, or rubs. Peripheral pulses normal and equal  without edema.  Abdomen: Soft & bowel sounds normal. Non-tender w/o guarding, rebound,  hernias, masses, or organomegaly.  Lymphatics: Unremarkable.  Musculoskeletal: Full ROM all peripheral extremities, joint stability, 5/5 strength, and normal gait.  Skin: Warm, dry without exposed rashes, lesions or ecchymosis apparent.  Neuro: Cranial nerves intact, reflexes equal bilaterally. Sensory-motor testing grossly intact. Tendon reflexes grossly intact.  Pysch: Alert & oriented x 3.  Insight and judgement nl & appropriate. No ideations.  Assessment and Plan:  1. Essential hypertension - TSH  2. Hyperlipidemia - Lipid panel - TSH  3. Prediabetes - Hemoglobin A1c - Insulin, random  4. Vitamin D deficiency - VITAMIN D 25 Hydroxy   5. Testosterone deficiency  6. Gastroesophageal reflux disease  7. Medication management  - CBC with Differential/Platelet - Hepatic function panel - BASIC METABOLIC PANEL WITH GFR - Magnesium   Recommended regular exercise, BP monitoring, weight control, and discussed med and SE's. Recommended labs to assess and monitor clinical status. Further disposition pending results of labs. Over 30 minutes of exam, counseling, chart review was performed

## 2015-06-24 LAB — VITAMIN D 25 HYDROXY (VIT D DEFICIENCY, FRACTURES): VIT D 25 HYDROXY: 59 ng/mL (ref 30–100)

## 2015-06-24 LAB — INSULIN, RANDOM: INSULIN: 4.7 u[IU]/mL (ref 2.0–19.6)

## 2015-08-26 DIAGNOSIS — C44722 Squamous cell carcinoma of skin of right lower limb, including hip: Secondary | ICD-10-CM | POA: Diagnosis not present

## 2015-08-26 DIAGNOSIS — L814 Other melanin hyperpigmentation: Secondary | ICD-10-CM | POA: Diagnosis not present

## 2015-08-26 DIAGNOSIS — Z85828 Personal history of other malignant neoplasm of skin: Secondary | ICD-10-CM | POA: Diagnosis not present

## 2015-08-26 DIAGNOSIS — L720 Epidermal cyst: Secondary | ICD-10-CM | POA: Diagnosis not present

## 2015-08-26 DIAGNOSIS — D2372 Other benign neoplasm of skin of left lower limb, including hip: Secondary | ICD-10-CM | POA: Diagnosis not present

## 2015-08-26 DIAGNOSIS — D485 Neoplasm of uncertain behavior of skin: Secondary | ICD-10-CM | POA: Diagnosis not present

## 2015-08-26 DIAGNOSIS — L570 Actinic keratosis: Secondary | ICD-10-CM | POA: Diagnosis not present

## 2015-08-26 DIAGNOSIS — D225 Melanocytic nevi of trunk: Secondary | ICD-10-CM | POA: Diagnosis not present

## 2015-08-26 DIAGNOSIS — D3617 Benign neoplasm of peripheral nerves and autonomic nervous system of trunk, unspecified: Secondary | ICD-10-CM | POA: Diagnosis not present

## 2015-08-26 DIAGNOSIS — L821 Other seborrheic keratosis: Secondary | ICD-10-CM | POA: Diagnosis not present

## 2015-08-26 DIAGNOSIS — D1801 Hemangioma of skin and subcutaneous tissue: Secondary | ICD-10-CM | POA: Diagnosis not present

## 2015-09-22 DIAGNOSIS — H01025 Squamous blepharitis left lower eyelid: Secondary | ICD-10-CM | POA: Diagnosis not present

## 2015-09-22 DIAGNOSIS — Z961 Presence of intraocular lens: Secondary | ICD-10-CM | POA: Diagnosis not present

## 2015-09-22 DIAGNOSIS — H01024 Squamous blepharitis left upper eyelid: Secondary | ICD-10-CM | POA: Diagnosis not present

## 2015-09-22 DIAGNOSIS — H01021 Squamous blepharitis right upper eyelid: Secondary | ICD-10-CM | POA: Diagnosis not present

## 2015-09-22 DIAGNOSIS — H01022 Squamous blepharitis right lower eyelid: Secondary | ICD-10-CM | POA: Diagnosis not present

## 2015-09-22 DIAGNOSIS — H10413 Chronic giant papillary conjunctivitis, bilateral: Secondary | ICD-10-CM | POA: Diagnosis not present

## 2015-09-22 DIAGNOSIS — H04123 Dry eye syndrome of bilateral lacrimal glands: Secondary | ICD-10-CM | POA: Diagnosis not present

## 2015-09-29 ENCOUNTER — Ambulatory Visit (INDEPENDENT_AMBULATORY_CARE_PROVIDER_SITE_OTHER): Payer: Medicare HMO | Admitting: Internal Medicine

## 2015-09-29 ENCOUNTER — Encounter: Payer: Self-pay | Admitting: Internal Medicine

## 2015-09-29 VITALS — BP 124/70 | HR 58 | Temp 98.0°F | Resp 16 | Ht 72.0 in | Wt 180.0 lb

## 2015-09-29 DIAGNOSIS — E559 Vitamin D deficiency, unspecified: Secondary | ICD-10-CM | POA: Diagnosis not present

## 2015-09-29 DIAGNOSIS — K219 Gastro-esophageal reflux disease without esophagitis: Secondary | ICD-10-CM

## 2015-09-29 DIAGNOSIS — Z6825 Body mass index (BMI) 25.0-25.9, adult: Secondary | ICD-10-CM

## 2015-09-29 DIAGNOSIS — Z0001 Encounter for general adult medical examination with abnormal findings: Secondary | ICD-10-CM

## 2015-09-29 DIAGNOSIS — E291 Testicular hypofunction: Secondary | ICD-10-CM

## 2015-09-29 DIAGNOSIS — I10 Essential (primary) hypertension: Secondary | ICD-10-CM | POA: Diagnosis not present

## 2015-09-29 DIAGNOSIS — E785 Hyperlipidemia, unspecified: Secondary | ICD-10-CM

## 2015-09-29 DIAGNOSIS — C61 Malignant neoplasm of prostate: Secondary | ICD-10-CM | POA: Diagnosis not present

## 2015-09-29 DIAGNOSIS — R7303 Prediabetes: Secondary | ICD-10-CM

## 2015-09-29 DIAGNOSIS — R6889 Other general symptoms and signs: Secondary | ICD-10-CM | POA: Diagnosis not present

## 2015-09-29 DIAGNOSIS — Z79899 Other long term (current) drug therapy: Secondary | ICD-10-CM | POA: Diagnosis not present

## 2015-09-29 DIAGNOSIS — Z0000 Encounter for general adult medical examination without abnormal findings: Secondary | ICD-10-CM

## 2015-09-29 DIAGNOSIS — E349 Endocrine disorder, unspecified: Secondary | ICD-10-CM

## 2015-09-29 LAB — BASIC METABOLIC PANEL WITH GFR
BUN: 21 mg/dL (ref 7–25)
CALCIUM: 9.9 mg/dL (ref 8.6–10.3)
CO2: 24 mmol/L (ref 20–31)
CREATININE: 1.42 mg/dL — AB (ref 0.70–1.25)
Chloride: 108 mmol/L (ref 98–110)
GFR, Est African American: 58 mL/min — ABNORMAL LOW (ref >=60)
GFR, Est Non African American: 50 mL/min — ABNORMAL LOW (ref >=60)
Glucose, Bld: 83 mg/dL (ref 65–99)
Potassium: 4.2 mmol/L (ref 3.5–5.3)
SODIUM: 141 mmol/L (ref 135–146)

## 2015-09-29 LAB — LIPID PANEL
CHOL/HDL RATIO: 4 ratio (ref <=5.0)
CHOLESTEROL: 246 mg/dL — AB (ref 125–200)
HDL: 61 mg/dL (ref >=40)
LDL Cholesterol: 133 mg/dL — ABNORMAL HIGH (ref <130)
TRIGLYCERIDES: 261 mg/dL — AB (ref <150)
VLDL: 52 mg/dL — AB (ref <30)

## 2015-09-29 LAB — HEPATIC FUNCTION PANEL
ALBUMIN: 4 g/dL (ref 3.6–5.1)
ALT: 15 U/L (ref 9–46)
AST: 21 U/L (ref 10–35)
Alkaline Phosphatase: 34 U/L — ABNORMAL LOW (ref 40–115)
BILIRUBIN DIRECT: 0.1 mg/dL (ref <=0.2)
BILIRUBIN TOTAL: 0.5 mg/dL (ref 0.2–1.2)
Indirect Bilirubin: 0.4 mg/dL (ref 0.2–1.2)
Total Protein: 6.3 g/dL (ref 6.1–8.1)

## 2015-09-29 LAB — CBC WITH DIFFERENTIAL/PLATELET
BASOS ABS: 0 {cells}/uL (ref 0–200)
Basophils Relative: 0 %
EOS ABS: 90 {cells}/uL (ref 15–500)
EOS PCT: 2 %
HCT: 48.2 % (ref 38.5–50.0)
HEMOGLOBIN: 16.6 g/dL (ref 13.2–17.1)
LYMPHS ABS: 1350 {cells}/uL (ref 850–3900)
Lymphocytes Relative: 30 %
MCH: 35.2 pg — AB (ref 27.0–33.0)
MCHC: 34.4 g/dL (ref 32.0–36.0)
MCV: 102.1 fL — AB (ref 80.0–100.0)
MONOS PCT: 12 %
MPV: 9.6 fL (ref 7.5–12.5)
Monocytes Absolute: 540 cells/uL (ref 200–950)
NEUTROS ABS: 2520 {cells}/uL (ref 1500–7800)
NEUTROS PCT: 56 %
Platelets: 200 10*3/uL (ref 140–400)
RBC: 4.72 MIL/uL (ref 4.20–5.80)
RDW: 13.3 % (ref 11.0–15.0)
WBC: 4.5 10*3/uL (ref 3.8–10.8)

## 2015-09-29 NOTE — Progress Notes (Signed)
MEDICARE ANNUAL WELLNESS VISIT AND FOLLOW UP Assessment:    1. Essential hypertension -cont meds -well controlled -cont dash diet -monitor at home - TSH  2. Hyperlipidemia -cont lipitor - Lipid panel  3. Prediabetes -cont diet and exercise - Hemoglobin A1c  4. Vitamin D deficiency -cont Vit D  5. Medication management  - CBC with Differential/Platelet - BASIC METABOLIC PANEL WITH GFR - Hepatic function panel  6. Gastroesophageal reflux disease, esophagitis presence not specified -cont meds  7. Prostate cancer [facility]) -followed by urology  8. Testosterone deficiency -cont monitoring twice yearly -not currently on replacement.    9. BMI 25.0-25.9,adult -cont diet and exercise    Over 30 minutes of exam, counseling, chart review, and critical decision making was performed  Future Appointments Date Time Provider [place]  01/20/2016 9:00 AM Unk Pinto, MD [facility] None     Plan:   During the course of the visit the patient was educated and counseled about appropriate screening and preventive services including:    Pneumococcal vaccine   Influenza vaccine  Prevnar 13  Td vaccine  Screening electrocardiogram  Colorectal cancer screening  Diabetes screening  Glaucoma screening  Nutrition counseling    Subjective:  Juan Torres is a 70 y.o. male who presents for Medicare Annual Wellness Visit and 3 month follow up for HTN, hyperlipidemia, prediabetes, and vitamin D Def.   His blood pressure has been controlled at home, today their BP is BP: 124/70 He does not workout. He denies chest pain, shortness of breath, dizziness.    He is on cholesterol medication and denies myalgias. His cholesterol is not at goal. The cholesterol last visit was:   Lab Results  Component Value Date   CHOL 225 (H) 06/23/2015   HDL 59 06/23/2015   LDLCALC 115 06/23/2015   TRIG 253 (H) 06/23/2015   CHOLHDL 3.8 06/23/2015   Patient has diet  controlled prediabetes.  He has been working on his diet.  He is walking a lot.   Lab Results  Component Value Date   HGBA1C 5.3 06/23/2015   Last GFR Lab Results  Component Value Date   GFRNONAA 52 (L) 06/23/2015     Lab Results  Component Value Date   GFRAA 60 06/23/2015   Patient is on Vitamin D supplement.   Lab Results  Component Value Date   VD25OH 59 06/23/2015      Medication Review: Current Outpatient Prescriptions on File Prior to Visit  Medication Sig Dispense Refill  . amLODipine-olmesartan (AZOR) 5-40 MG tablet Take 1 tablet by mouth  every morning for blood  pressure 30 tablet 0  . aspirin 81 MG tablet Take 81 mg by mouth daily.    . B Complex Vitamins (VITAMIN B-COMPLEX PO) Take 1 tablet by mouth daily.    . Cholecalciferol (VITAMIN D) 2000 UNITS CAPS Take 2 capsules by mouth daily.    . diclofenac sodium (VOLTAREN) 1 % GEL Apply 2 g topically 4 (four) times daily as needed (joint pain). 100 g 0  . fenofibrate micronized (LOFIBRA) 134 MG capsule Take 1 capsule by mouth  every day 90 capsule 1  . Magnesium 400 MG CAPS Take 2 capsules by mouth daily.    . Omega-3 Fatty Acids (FISH OIL PO) Take by mouth.    Marland Kitchen omeprazole (PRILOSEC) 20 MG capsule Take 1 capsule by mouth  every day for acid reflux 90 capsule 1  . rosuvastatin (CRESTOR) 40 MG tablet Take 1 tablet by mouth at  bedtime 90 tablet 1  . vitamin E 1000 UNIT capsule Take 1,000 Units by mouth daily.     No current facility-administered medications on file prior to visit.     Allergies: Allergies  Allergen Reactions  . Lipitor [Atorvastatin]   . Niacin And Related     Flushing  . Penicillins Rash    Current Problems (verified) has Prostate cancer ([address]); GERD (gastroesophageal reflux disease); Hyperlipidemia; Hypertension; Prediabetes; Vitamin D deficiency; Medication management; Testosterone deficiency; and BMI 25.0-25.9,adult on his problem list.  Screening Tests Immunization History  Administered  Date(s) Administered  . DT 10/20/2011  . Zoster 10/20/2011    Preventative care: Last colonoscopy: 2013  Prior vaccinations: TD or Tdap: 2013  Influenza: declined  Pneumococcal: declined Prevnar13: declined Shingles/Zostavax: 2013  Names of Other Physician/Practitioners you currently use: 1. [facility] here for primary care 2. Dr. Delman Cheadle, eye doctor, last visit 2017 3. Still sees, dentist, last visit 2017 Patient Care Team: Unk Pinto, MD as PCP - General (Internal Medicine) Inda Castle, MD as Consulting Physician (Gastroenterology) Arloa Koh, MD as Consulting Physician (Radiation Oncology) Clent Jacks, MD as Consulting Physician (Ophthalmology)  Surgical: He  has a past surgical history that includes ostate biopsy (12/19/2011); Colonoscopy w/ polypectomy; Radioactive seed implant (N/A, 04/04/2012); and Cystoscopy (N/A, 04/04/2012). Family His family history includes Breast cancer in his mother; Cancer in his cousin, maternal grandfather, and maternal uncle. Social history  He reports that he quit smoking about 5 years ago. His smoking use included Cigarettes. He has a 20.00 pack-year smoking history. He has never used smokeless tobacco. He reports that he drinks about 21.6 oz of alcohol per week . He reports that he uses drugs, including Marijuana, about 6 times per week.  MEDICARE WELLNESS OBJECTIVES: Physical activity: Current Exercise Habits: Home exercise routine, Type of exercise: walking, Time (Minutes): 45, Frequency (Times/Week): 7, Weekly Exercise (Minutes/Week): 315, Intensity: Moderate Cardiac risk factors: Cardiac Risk Factors include: advanced age (>58men, >71 women);dyslipidemia;family history of premature cardiovascular disease;hypertension;male gender Depression/mood screen:   Depression screen [facility] 2/9 09/29/2015  Decreased Interest 0  Down, Depressed, Hopeless 0  PHQ - 2 Score 0    ADLs:  In your present  state of health, do you have any difficulty performing the following activities: 09/29/2015 06/23/2015  Hearing? Y N  Vision? N N  Difficulty concentrating or making decisions? N N  Walking or climbing stairs? N N  Dressing or bathing? N N  Doing errands, shopping? N N  Preparing Food and eating ? N -  Using the Toilet? N -  In the past six months, have you accidently leaked urine? N -  Do you have problems with loss of bowel control? N -  Managing your Medications? N -  Managing your Finances? N -  Housekeeping or managing your Housekeeping? N -  Some recent data might be hidden     Cognitive Testing  Alert? Yes  Normal Appearance?Yes  Oriented to person? Yes  Place? Yes   Time? Yes  Recall of three objects?  Yes  Can perform simple calculations? Yes  Displays appropriate judgment?Yes  Can read the correct time from a watch face?Yes  EOL planning: Does patient have an advance directive?: No Would patient like information on creating an advanced directive?: Yes - Educational materials given   Objective:   Today's Vitals   09/29/15 0854  BP: 124/70  Pulse: (!) 58  Resp: 16  Temp: 98 F (36.7 C)  TempSrc: Temporal  Weight: 180 lb (81.6 kg)  Height: 6' (1.829 m)   Body mass index is 24.41 kg/m.  General appearance: alert, no distress, WD/WN, male HEENT: normocephalic, sclerae anicteric, TMs pearly, nares patent, no discharge or erythema, pharynx normal Oral cavity: MMM, no lesions Neck: supple, no lymphadenopathy, no thyromegaly, no masses Heart: RRR, normal S1, S2, no murmurs Lungs: CTA bilaterally, no wheezes, rhonchi, or rales Abdomen: +bs, soft, non tender, non distended, no masses, no hepatomegaly, no splenomegaly Musculoskeletal: nontender, no swelling, no obvious deformity Extremities: no edema, no cyanosis, no clubbing Pulses: 2+ symmetric, upper and lower extremities, normal cap refill Neurological: alert, oriented x 3, CN2-12 intact, strength normal upper  extremities and lower extremities, sensation normal throughout, DTRs 2+ throughout, no cerebellar signs, gait normal Psychiatric: normal affect, behavior normal, pleasant   Medicare Attestation I have personally reviewed: The patient's medical and social history Their use of alcohol, tobacco or illicit drugs Their current medications and supplements The patient's functional ability including ADLs,fall risks, home safety risks, cognitive, and hearing and visual impairment Diet and physical activities Evidence for depression or mood disorders  The patient's weight, height, BMI, and visual acuity have been recorded in the chart.  I have made referrals, counseling, and provided education to the patient based on review of the above and I have provided the patient with a written personalized care plan for preventive services.     Starlyn Skeans, PA-C   09/29/2015

## 2015-09-30 LAB — HEMOGLOBIN A1C
Hgb A1c MFr Bld: 5.2 % (ref <5.7)
Mean Plasma Glucose: 103 mg/dL

## 2015-09-30 LAB — TSH: TSH: 2.26 m[IU]/L (ref 0.40–4.50)

## 2016-01-19 NOTE — Progress Notes (Signed)
[facility]   Unk Pinto, M.D.    Uvaldo Bristle. Silverio Lay, P.A.-C      Starlyn Skeans, P.A.-C  [facility]                [address] [place] [id number] Telephone [phone] Telefax [phone] Annual  Screening/Preventative Visit  & Comprehensive Evaluation & Examination     This very nice 70 y.o. MWM presents for a Screening/Preventative Visit & comprehensive evaluation and management of multiple medical co-morbidities.  Patient has been followed for HTN, Prediabetes, Hyperlipidemia and Vitamin D Deficiency. Patient's GERD is stable & controlled w/ prudent diet & current meds. Also, patient has hx/o Prostate Ca treated by Brachytherapy (2014) per Dr's [facility].      HTN predates circa 1998 monitored til treatment initiated in 2006. Marland Kitchen Patient's BP has been controlled at home.  Today's BP is at goal - 128/82.  Patient denies any cardiac symptoms as chest pain, palpitations, shortness of breath, dizziness or ankle swelling.     Patient's hyperlipidemia is not controlled with diet and medications. Patient denies myalgias or other medication SE's. Last lipids were not at goal (suspect patient was off meds): Lab Results  Component Value Date   CHOL 246 (H) 09/29/2015   HDL 61 09/29/2015   LDLCALC 133 (H) 09/29/2015   TRIG 261 (H) 09/29/2015   CHOLHDL 4.0 09/29/2015      Patient has prediabetes with A1c 5.7% circa 2014 and patient denies reactive hypoglycemic symptoms, visual blurring, diabetic polys or paresthesias. Last A1c was at goal: Lab Results  Component Value Date   HGBA1C 5.2 09/29/2015       Finally, patient has history of Vitamin D Deficiency in 2013 of "13" and last vitamin D was at goal: Lab Results  Component Value Date   VD25OH 73 06/23/2015   Current Outpatient Prescriptions on File Prior to Visit  Medication Sig  . amLODipine-olmesartan (AZOR) 5-40 MG tablet Take  1 tablet by mouth  every morning for blood  pressure  . aspirin 81 MG tablet Take 81 mg by mouth daily.  . B Complex Vitamins (VITAMIN B-COMPLEX PO) Take 1 tablet by mouth daily.  . Cholecalciferol (VITAMIN D) 2000 UNITS CAPS Take 2 capsules by mouth daily.  . diclofenac sodium (VOLTAREN) 1 % GEL Apply 2 g topically 4 (four) times daily as needed (joint pain).  . fenofibrate micronized (LOFIBRA) 134 MG capsule Take 1 capsule by mouth  every day  . Magnesium 400 MG CAPS Take 2 capsules by mouth daily.  . Omega-3 Fatty Acids (FISH OIL PO) Take by mouth.  Marland Kitchen omeprazole (PRILOSEC) 20 MG capsule Take 1 capsule by mouth  every day for acid reflux  . rosuvastatin (CRESTOR) 40 MG tablet Take 1 tablet by mouth at  bedtime  . vitamin E 1000 UNIT capsule Take 1,000 Units by mouth daily.   No current facility-administered medications on file prior to visit.    Allergies  Allergen Reactions  . Lipitor [Atorvastatin]   . Niacin And Related     Flushing  . Penicillins Rash   Past Medical History:  Diagnosis Date  . Adenomatous colon polyp   . GERD (gastroesophageal reflux disease)   . History of basal cell cancer 2012  . Hyperlipidemia   . Hypertension   . Prediabetes   . Prostate cancer ([place]) DX 12/19/11  bx=Adenocarcinoma,gleason=3+3=6,volume=42cc,PSA=4.63  . Vitamin D deficiency    Health Maintenance  Topic Date Due  . Hepatitis C Screening  Jul 11, 1945  . PNA vac Low Risk Adult (2 of 2 - PPSV23) 10/19/2012  . INFLUENZA VACCINE  09/08/2015  . TETANUS/TDAP  10/19/2021  . COLONOSCOPY  12/01/2021  . ZOSTAVAX  Completed   Immunization History  Administered Date(s) Administered  . DT 10/20/2011  . Zoster 10/20/2011   Past Surgical History:  Procedure Laterality Date  . COLONOSCOPY W/ POLYPECTOMY     Adenomatous  . CYSTOSCOPY N/A 04/04/2012   Procedure: CYSTOSCOPY;  Surgeon: Molli Hazard, MD;  Location: [facility];  Service: Urology;  Laterality: N/A;  .  PROSTATE BIOPSY  12/19/2011   Procedure: BIOPSY TRANSRECTAL ULTRASONIC PROSTATE (TUBP);  Surgeon: Molli Hazard, MD;  Location: [facility];  Service: Urology;  Laterality: N/A;    . RADIOACTIVE SEED IMPLANT N/A 04/04/2012   Procedure: RADIOACTIVE SEED IMPLANT;  Surgeon: Molli Hazard, MD;  Location: [facility];  Service: Urology;  Laterality: N/A;  Seeds Implanted     72  Seeds Found in Bladder     None    Family History  Problem Relation Age of Onset  . Breast cancer Mother   . Cancer Maternal Grandfather     colon  . Cancer Maternal Uncle     prostate  . Cancer Cousin     prostyae cancer 1st maternal    Social History   Social History  . Marital status: Married    Spouse name: N/A  . Number of children: 2  . Years of education: N/A   Occupational History  . Not on file.   Social History Main Topics  . Smoking status: Former Smoker    Packs/day: 1.00    Years: 20.00    Types: Cigarettes    Quit date: 03/30/2010  . Smokeless tobacco: Never Used     Comment: smokes other  . Alcohol use 21.6 oz/week    36 Cans of beer per week     Comment: 4-6 beers daily  . Drug use:     Frequency: 6.0 times per week    Types: Marijuana     Comment: / marijuana  . Sexual activity: Not on file   Other Topics Concern  . Not on file   Social History Narrative  . No narrative on file    ROS Constitutional: Denies fever, chills, weight loss/gain, headaches, insomnia,  night sweats or change in appetite. Does c/o fatigue. Eyes: Denies redness, blurred vision, diplopia, discharge, itchy or watery eyes.  ENT: Denies discharge, congestion, post nasal drip, epistaxis, sore throat, earache, hearing loss, dental pain, Tinnitus, Vertigo, Sinus pain or snoring.  Cardio: Denies chest pain, palpitations, irregular heartbeat, syncope, dyspnea, diaphoresis, orthopnea, PND, claudication or edema Respiratory: denies cough, dyspnea, DOE, pleurisy,  hoarseness, laryngitis or wheezing.  Gastrointestinal: Denies dysphagia, heartburn, reflux, water brash, pain, cramps, nausea, vomiting, bloating, diarrhea, constipation, hematemesis, melena, hematochezia, jaundice or hemorrhoids Genitourinary: Denies dysuria, frequency, urgency, nocturia, hesitancy, discharge, hematuria or flank pain Musculoskeletal: Denies arthralgia, myalgia, stiffness, Jt. Swelling, pain, limp or strain/sprain. Denies Falls. Skin: Denies puritis, rash, hives, warts, acne, eczema or change in skin lesion Neuro: No weakness, tremor, incoordination, spasms, paresthesia or pain Psychiatric: Denies confusion, memory loss or sensory loss. Denies Depression. Endocrine: Denies change in weight, skin, hair change, nocturia, and paresthesia, diabetic polys, visual blurring or hyper / hypo glycemic episodes.  Heme/Lymph: No excessive bleeding,  bruising or enlarged lymph nodes.  Physical Exam  BP 128/82   Pulse 72   Temp 97.5 F (36.4 C)   Resp 16   Ht 6' 0.5" (1.842 m)   Wt 181 lb (82.1 kg)   BMI 24.21 kg/m   General Appearance: Well nourished, in no apparent distress.  Eyes: PERRLA, EOMs, conjunctiva no swelling or erythema, normal fundi and vessels. Sinuses: No frontal/maxillary tenderness ENT/Mouth: EACs patent / TMs  nl. Nares clear without erythema, swelling, mucoid exudates. Oral hygiene is good. No erythema, swelling, or exudate. Tongue normal, non-obstructing. Tonsils not swollen or erythematous. Hearing normal.  Neck: Supple, thyroid normal. No bruits, nodes or JVD. Respiratory: Respiratory effort normal.  BS equal and clear bilateral without rales, rhonci, wheezing or stridor. Cardio: Heart sounds are normal with regular rate and rhythm and no murmurs, rubs or gallops. Peripheral pulses are normal and equal bilaterally without edema. No aortic or femoral bruits. Chest: symmetric with normal excursions and percussion.  Abdomen: Soft, with Nl bowel sounds. Nontender,  no guarding, rebound, hernias, masses, or organomegaly.  Lymphatics: Non tender without lymphadenopathy.  Genitourinary: No hernias.Testes nl. DRE - deferred to Dr Jasmine December.  Musculoskeletal: Full ROM all peripheral extremities, joint stability, 5/5 strength, and normal gait. Skin: Warm and dry without rashes, lesions, cyanosis, clubbing or  ecchymosis.  Neuro: Cranial nerves intact, reflexes equal bilaterally. Normal muscle tone, no cerebellar symptoms. Sensation intact.  Pysch: Alert and oriented X 3 with normal affect, insight and judgment appropriate.   Assessment and Plan  1. Annual Preventative/Screening Exam    2. Essential hypertension  - Microalbumin / creatinine urine ratio - EKG 12-Lead - [place], RETROPERITNL ABD,  LTD - Urinalysis, Routine w reflex microscopic - CBC with Differential/Platelet - BASIC METABOLIC PANEL WITH GFR - TSH  3. Mixed hyperlipidemia  - EKG 12-Lead - [place], RETROPERITNL ABD,  LTD - Hepatic function panel - Lipid panel - TSH  4. Prediabetes  - EKG 12-Lead - [place], RETROPERITNL ABD,  LTD - Hemoglobin A1c - Insulin, random  5. Vitamin D deficiency  - VITAMIN D 25 Hydroxy   6. Gastroesophageal reflux disease   7. Prostate cancer ([place])  - PSA  8. Screening for rectal cancer  - POC Hemoccult Bld/Stl   9. Screening for ischemic heart disease  - EKG 12-Lead  10. Screening for AAA (aortic abdominal aneurysm)  - [place], RETROPERITNL ABD,  LTD  11. Medication management  - Urinalysis, Routine w reflex microscopic - CBC with Differential/Platelet - BASIC METABOLIC PANEL WITH GFR - Magnesium       Continue prudent diet as discussed, weight control, BP monitoring, regular exercise, and medications as discussed.  Discussed med effects and SE's. Routine screening labs and tests as requested with regular follow-up as recommended. Over 40 minutes of exam, counseling, chart review and high complex critical decision making was performed

## 2016-01-19 NOTE — Patient Instructions (Signed)

## 2016-01-20 ENCOUNTER — Ambulatory Visit (INDEPENDENT_AMBULATORY_CARE_PROVIDER_SITE_OTHER): Payer: Medicare HMO | Admitting: Internal Medicine

## 2016-01-20 ENCOUNTER — Encounter: Payer: Self-pay | Admitting: Internal Medicine

## 2016-01-20 VITALS — BP 128/82 | HR 72 | Temp 97.5°F | Resp 16 | Ht 72.5 in | Wt 181.0 lb

## 2016-01-20 DIAGNOSIS — R6889 Other general symptoms and signs: Secondary | ICD-10-CM | POA: Diagnosis not present

## 2016-01-20 DIAGNOSIS — E782 Mixed hyperlipidemia: Secondary | ICD-10-CM

## 2016-01-20 DIAGNOSIS — I10 Essential (primary) hypertension: Secondary | ICD-10-CM | POA: Diagnosis not present

## 2016-01-20 DIAGNOSIS — Z136 Encounter for screening for cardiovascular disorders: Secondary | ICD-10-CM

## 2016-01-20 DIAGNOSIS — R7303 Prediabetes: Secondary | ICD-10-CM

## 2016-01-20 DIAGNOSIS — C61 Malignant neoplasm of prostate: Secondary | ICD-10-CM

## 2016-01-20 DIAGNOSIS — Z0001 Encounter for general adult medical examination with abnormal findings: Secondary | ICD-10-CM

## 2016-01-20 DIAGNOSIS — K219 Gastro-esophageal reflux disease without esophagitis: Secondary | ICD-10-CM

## 2016-01-20 DIAGNOSIS — Z79899 Other long term (current) drug therapy: Secondary | ICD-10-CM

## 2016-01-20 DIAGNOSIS — Z1212 Encounter for screening for malignant neoplasm of rectum: Secondary | ICD-10-CM

## 2016-01-20 DIAGNOSIS — E559 Vitamin D deficiency, unspecified: Secondary | ICD-10-CM | POA: Diagnosis not present

## 2016-01-20 LAB — CBC WITH DIFFERENTIAL/PLATELET
BASOS PCT: 1 %
Basophils Absolute: 36 cells/uL (ref 0–200)
EOS ABS: 36 {cells}/uL (ref 15–500)
Eosinophils Relative: 1 %
HEMATOCRIT: 48.6 % (ref 38.5–50.0)
HEMOGLOBIN: 16.8 g/dL (ref 13.2–17.1)
LYMPHS PCT: 33 %
Lymphs Abs: 1188 cells/uL (ref 850–3900)
MCH: 34.9 pg — ABNORMAL HIGH (ref 27.0–33.0)
MCHC: 34.6 g/dL (ref 32.0–36.0)
MCV: 101 fL — AB (ref 80.0–100.0)
MONO ABS: 396 {cells}/uL (ref 200–950)
MPV: 9.7 fL (ref 7.5–12.5)
Monocytes Relative: 11 %
Neutro Abs: 1944 cells/uL (ref 1500–7800)
Neutrophils Relative %: 54 %
Platelets: 202 10*3/uL (ref 140–400)
RBC: 4.81 MIL/uL (ref 4.20–5.80)
RDW: 13.3 % (ref 11.0–15.0)
WBC: 3.6 10*3/uL — ABNORMAL LOW (ref 3.8–10.8)

## 2016-01-20 LAB — TSH: TSH: 2.84 m[IU]/L (ref 0.40–4.50)

## 2016-01-20 LAB — PSA: PSA: <0.1 ng/mL (ref <=4.0)

## 2016-01-21 LAB — INSULIN, RANDOM: INSULIN: 15.3 u[IU]/mL (ref 2.0–19.6)

## 2016-01-21 LAB — BASIC METABOLIC PANEL WITH GFR
BUN: 26 mg/dL — AB (ref 7–25)
CHLORIDE: 107 mmol/L (ref 98–110)
CO2: 21 mmol/L (ref 20–31)
Calcium: 10.2 mg/dL (ref 8.6–10.3)
Creat: 1.68 mg/dL — ABNORMAL HIGH (ref 0.70–1.18)
GFR, EST NON AFRICAN AMERICAN: 41 mL/min — AB (ref >=60)
GFR, Est African American: 47 mL/min — ABNORMAL LOW (ref >=60)
GLUCOSE: 103 mg/dL — AB (ref 65–99)
POTASSIUM: 4.3 mmol/L (ref 3.5–5.3)
Sodium: 141 mmol/L (ref 135–146)

## 2016-01-21 LAB — URINALYSIS, ROUTINE W REFLEX MICROSCOPIC
Bilirubin Urine: NEGATIVE
GLUCOSE, UA: NEGATIVE
HGB URINE DIPSTICK: NEGATIVE
KETONES UR: NEGATIVE
LEUKOCYTES UA: NEGATIVE
Nitrite: NEGATIVE
PH: 7.5 (ref 5.0–8.0)
Protein, ur: NEGATIVE
SPECIFIC GRAVITY, URINE: 1.017 (ref 1.001–1.035)

## 2016-01-21 LAB — MICROALBUMIN / CREATININE URINE RATIO
Creatinine, Urine: 155 mg/dL (ref 20–370)
MICROALB/CREAT RATIO: 4 ug/mg{creat} (ref <30)
Microalb, Ur: 0.6 mg/dL

## 2016-01-21 LAB — HEPATIC FUNCTION PANEL
ALBUMIN: 4.3 g/dL (ref 3.6–5.1)
ALK PHOS: 27 U/L — AB (ref 40–115)
ALT: 17 U/L (ref 9–46)
AST: 29 U/L (ref 10–35)
BILIRUBIN INDIRECT: 0.5 mg/dL (ref 0.2–1.2)
Bilirubin, Direct: 0.1 mg/dL (ref <=0.2)
TOTAL PROTEIN: 6.5 g/dL (ref 6.1–8.1)
Total Bilirubin: 0.6 mg/dL (ref 0.2–1.2)

## 2016-01-21 LAB — LIPID PANEL
Cholesterol: 209 mg/dL — ABNORMAL HIGH (ref <200)
HDL: 69 mg/dL (ref >40)
LDL CALC: 103 mg/dL — AB (ref <100)
Total CHOL/HDL Ratio: 3 Ratio (ref <5.0)
Triglycerides: 184 mg/dL — ABNORMAL HIGH (ref <150)
VLDL: 37 mg/dL — AB (ref <30)

## 2016-01-21 LAB — HEMOGLOBIN A1C
Hgb A1c MFr Bld: 5.3 % (ref <5.7)
MEAN PLASMA GLUCOSE: 105 mg/dL

## 2016-01-21 LAB — VITAMIN D 25 HYDROXY (VIT D DEFICIENCY, FRACTURES): Vit D, 25-Hydroxy: 68 ng/mL (ref 30–100)

## 2016-01-21 LAB — MAGNESIUM: Magnesium: 1.9 mg/dL (ref 1.5–2.5)

## 2016-03-10 DIAGNOSIS — L821 Other seborrheic keratosis: Secondary | ICD-10-CM | POA: Diagnosis not present

## 2016-03-10 DIAGNOSIS — D225 Melanocytic nevi of trunk: Secondary | ICD-10-CM | POA: Diagnosis not present

## 2016-03-10 DIAGNOSIS — D045 Carcinoma in situ of skin of trunk: Secondary | ICD-10-CM | POA: Diagnosis not present

## 2016-03-10 DIAGNOSIS — L814 Other melanin hyperpigmentation: Secondary | ICD-10-CM | POA: Diagnosis not present

## 2016-03-10 DIAGNOSIS — D1801 Hemangioma of skin and subcutaneous tissue: Secondary | ICD-10-CM | POA: Diagnosis not present

## 2016-03-10 DIAGNOSIS — Z85828 Personal history of other malignant neoplasm of skin: Secondary | ICD-10-CM | POA: Diagnosis not present

## 2016-03-10 DIAGNOSIS — C44629 Squamous cell carcinoma of skin of left upper limb, including shoulder: Secondary | ICD-10-CM | POA: Diagnosis not present

## 2016-03-10 DIAGNOSIS — D485 Neoplasm of uncertain behavior of skin: Secondary | ICD-10-CM | POA: Diagnosis not present

## 2016-03-10 DIAGNOSIS — L570 Actinic keratosis: Secondary | ICD-10-CM | POA: Diagnosis not present

## 2016-03-10 DIAGNOSIS — D692 Other nonthrombocytopenic purpura: Secondary | ICD-10-CM | POA: Diagnosis not present

## 2016-04-28 ENCOUNTER — Ambulatory Visit (INDEPENDENT_AMBULATORY_CARE_PROVIDER_SITE_OTHER): Payer: Medicare HMO | Admitting: Internal Medicine

## 2016-04-28 ENCOUNTER — Encounter: Payer: Self-pay | Admitting: Internal Medicine

## 2016-04-28 VITALS — BP 124/62 | HR 78 | Temp 98.0°F | Resp 16 | Ht 72.5 in | Wt 175.0 lb

## 2016-04-28 DIAGNOSIS — E782 Mixed hyperlipidemia: Secondary | ICD-10-CM

## 2016-04-28 DIAGNOSIS — E349 Endocrine disorder, unspecified: Secondary | ICD-10-CM | POA: Diagnosis not present

## 2016-04-28 DIAGNOSIS — I10 Essential (primary) hypertension: Secondary | ICD-10-CM | POA: Diagnosis not present

## 2016-04-28 DIAGNOSIS — Z23 Encounter for immunization: Secondary | ICD-10-CM

## 2016-04-28 DIAGNOSIS — R7303 Prediabetes: Secondary | ICD-10-CM | POA: Diagnosis not present

## 2016-04-28 DIAGNOSIS — E559 Vitamin D deficiency, unspecified: Secondary | ICD-10-CM | POA: Diagnosis not present

## 2016-04-28 MED ORDER — CYCLOBENZAPRINE HCL 10 MG PO TABS: 10.0000 mg | ORAL_TABLET | Freq: Three times a day (TID) | ORAL | 0 refills | Status: DC | PRN

## 2016-04-28 MED FILL — CYCLOBENZAPRINE 10 MG TAB: 10 | 20 days supply | Qty: 60 | Fill #0

## 2016-04-28 NOTE — Patient Instructions (Signed)
Cervical Strain and Sprain Rehab Ask your health care provider which exercises are safe for you. Do exercises exactly as told by your health care provider and adjust them as directed. It is normal to feel mild stretching, pulling, tightness, or discomfort as you do these exercises, but you should stop right away if you feel sudden pain or your pain gets worse.Do not begin these exercises until told by your health care provider. Stretching and range of motion exercises These exercises warm up your muscles and joints and improve the movement and flexibility of your neck. These exercises also help to relieve pain, numbness, and tingling. Exercise A: Cervical side bend 1. Using good posture, sit on a stable chair or stand up. 2. Without moving your shoulders, slowly tilt your left / right ear to your shoulder until you feel a stretch in your neck muscles. You should be looking straight ahead. 3. Hold for __________ seconds. 4. Repeat with the other side of your neck. Repeat __________ times. Complete this exercise __________ times a day. Exercise B: Cervical rotation 1. Using good posture, sit on a stable chair or stand up. 2. Slowly turn your head to the side as if you are looking over your left / right shoulder.  Keep your eyes level with the ground.  Stop when you feel a stretch along the side and the back of your neck. 3. Hold for __________ seconds. 4. Repeat this by turning to your other side. Repeat __________ times. Complete this exercise __________ times a day. Exercise C: Thoracic extension and pectoral stretch 1. Roll a towel or a small blanket so it is about 4 inches (10 cm) in diameter. 2. Lie down on your back on a firm surface. 3. Put the towel lengthwise, under your spine in the middle of your back. It should not be not under your shoulder blades. The towel should line up with your spine from your middle back to your lower back. 4. Put your hands behind your head and let your  elbows fall out to your sides. 5. Hold for __________ seconds. Repeat __________ times. Complete this exercise __________ times a day. Strengthening exercises These exercises build strength and endurance in your neck. Endurance is the ability to use your muscles for a long time, even after your muscles get tired. Exercise D: Upper cervical flexion, isometric 1. Lie on your back with a thin pillow behind your head and a small rolled-up towel under your neck. 2. Gently tuck your chin toward your chest and nod your head down to look toward your feet. Do not lift your head off the pillow. 3. Hold for __________ seconds. 4. Release the tension slowly. Relax your neck muscles completely before you repeat this exercise. Repeat __________ times. Complete this exercise __________ times a day. Exercise E: Cervical extension, isometric 1. Stand about 6 inches (15 cm) away from a wall, with your back facing the wall. 2. Place a soft object, about 6-8 inches (15-20 cm) in diameter, between the back of your head and the wall. A soft object could be a small pillow, a ball, or a folded towel. 3. Gently tilt your head back and press into the soft object. Keep your jaw and forehead relaxed. 4. Hold for __________ seconds. 5. Release the tension slowly. Relax your neck muscles completely before you repeat this exercise. Repeat __________ times. Complete this exercise __________ times a day. Posture and body mechanics   Body mechanics refers to the movements and positions of your body   while you do your daily activities. Posture is part of body mechanics. Good posture and healthy body mechanics can help to relieve stress in your body's tissues and joints. Good posture means that your spine is in its natural S-curve position (your spine is neutral), your shoulders are pulled back slightly, and your head is not tipped forward. The following are general guidelines for applying improved posture and body mechanics to your  everyday activities. Standing  When standing, keep your spine neutral and keep your feet about hip-width apart. Keep a slight bend in your knees. Your ears, shoulders, and hips should line up.  When you do a task in which you stand in one place for a long time, place one foot up on a stable object that is 2-4 inches (5-10 cm) high, such as a footstool. This helps keep your spine neutral. Sitting  When sitting, keep your spine neutral and your keep feet flat on the floor. Use a footrest, if necessary, and keep your thighs parallel to the floor. Avoid rounding your shoulders, and avoid tilting your head forward.  When working at a desk or a computer, keep your desk at a height where your hands are slightly lower than your elbows. Slide your chair under your desk so you are close enough to maintain good posture.  When working at a computer, place your monitor at a height where you are looking straight ahead and you do not have to tilt your head forward or downward to look at the screen. Resting When lying down and resting, avoid positions that are most painful for you. Try to support your neck in a neutral position. You can use a contour pillow or a small rolled-up towel. Your pillow should support your neck but not push on it. This information is not intended to replace advice given to you by your health care provider. Make sure you discuss any questions you have with your health care provider. Document Released: 01/24/2005 Document Revised: 10/01/2015 Document Reviewed: 12/31/2014 Elsevier Interactive Patient Education  2017 [employer]  

## 2016-04-28 NOTE — Progress Notes (Signed)
Assessment and Plan:  Hypertension:  -Continue medication,  -monitor blood pressure at home.  -Continue DASH diet.   -Reminder to go to the ER if any CP, SOB, nausea, dizziness, severe HA, changes vision/speech, left arm numbness and tingling, and jaw pain.  Cholesterol: -Continue diet and exercise.   Pre-diabetes: -Continue diet and exercise.   Vitamin D Def: -continue medications.   Patient doing really well with weight loss and staying more active.  Will defer labs until next visit.  Continue current routine.  He will call if problems prior to his next routine.    Continue diet and meds as discussed. Further disposition pending results of labs.  HPI 71 y.o. male  presents for 3 month follow up with hypertension, hyperlipidemia, prediabetes and vitamin D.   His blood pressure has been controlled at home, today their BP is BP: 124/62.   He does not workout. He denies chest pain, shortness of breath, dizziness.  He is not formally working out but he is watching his grandkids and is having to keep up with them.  He feels that this has helped him some with weight loss.     He is not on cholesterol medication and denies myalgias. His cholesterol is not at goal. The cholesterol last visit was:   Lab Results  Component Value Date   CHOL 209 (H) 01/20/2016   HDL 69 01/20/2016   LDLCALC 103 (H) 01/20/2016   TRIG 184 (H) 01/20/2016   CHOLHDL 3.0 01/20/2016     He has been working on diet and exercise for prediabetes, and denies foot ulcerations, hyperglycemia, hypoglycemia , increased appetite, nausea, paresthesia of the feet, polydipsia, polyuria, visual disturbances, vomiting and weight loss. Last A1C in the office was:  Lab Results  Component Value Date   HGBA1C 5.3 01/20/2016    Patient is on Vitamin D supplement.  Lab Results  Component Value Date   VD25OH 68 01/20/2016      He has no complaints today.    Current Medications:  Current Outpatient Prescriptions on File  Prior to Visit  Medication Sig Dispense Refill  . amLODipine-olmesartan (AZOR) 5-40 MG tablet Take 1 tablet by mouth  every morning for blood  pressure 30 tablet 0  . aspirin 81 MG tablet Take 81 mg by mouth daily.    . B Complex Vitamins (VITAMIN B-COMPLEX PO) Take 1 tablet by mouth daily.    . Cholecalciferol (VITAMIN D) 2000 UNITS CAPS Take 2 capsules by mouth daily.    . diclofenac sodium (VOLTAREN) 1 % GEL Apply 2 g topically 4 (four) times daily as needed (joint pain). 100 g 0  . fenofibrate micronized (LOFIBRA) 134 MG capsule Take 1 capsule by mouth  every day 90 capsule 1  . Magnesium 400 MG CAPS Take 2 capsules by mouth daily.    . Omega-3 Fatty Acids (FISH OIL PO) Take by mouth.    Marland Kitchen omeprazole (PRILOSEC) 20 MG capsule Take 1 capsule by mouth  every day for acid reflux 90 capsule 1  . rosuvastatin (CRESTOR) 40 MG tablet Take 1 tablet by mouth at  bedtime 90 tablet 1  . vitamin E 1000 UNIT capsule Take 1,000 Units by mouth daily.     No current facility-administered medications on file prior to visit.     Medical History:  Past Medical History:  Diagnosis Date  . Adenomatous colon polyp   . GERD (gastroesophageal reflux disease)   . History of basal cell cancer 2012  . Hyperlipidemia   .  Hypertension   . Prediabetes   . Prostate cancer ([place]) DX 12/19/11   bx=Adenocarcinoma,gleason=3+3=6,volume=42cc,PSA=4.63  . Vitamin D deficiency     Allergies:  Allergies  Allergen Reactions  . Lipitor [Atorvastatin]   . Niacin And Related     Flushing  . Penicillins Rash     Review of Systems:  Review of Systems  Constitutional: Negative for chills, fever and malaise/fatigue.  HENT: Negative for congestion, ear pain and sore throat.   Eyes: Negative.   Respiratory: Negative for cough, shortness of breath and wheezing.   Cardiovascular: Negative for chest pain, palpitations and leg swelling.  Gastrointestinal: Negative for abdominal pain, blood in stool, constipation,  diarrhea, heartburn and melena.  Genitourinary: Negative.   Skin: Negative.   Neurological: Negative for dizziness, sensory change, loss of consciousness and headaches.  Psychiatric/Behavioral: Negative for depression. The patient is not nervous/anxious and does not have insomnia.     Family history- Review and unchanged  Social history- Review and unchanged  Physical Exam: BP 124/62   Pulse 78   Temp 98 F (36.7 C) (Temporal)   Resp 16   Ht 6' 0.5" (1.842 m)   Wt 175 lb (79.4 kg)   BMI 23.41 kg/m  Wt Readings from Last 3 Encounters:  04/28/16 175 lb (79.4 kg)  01/20/16 181 lb (82.1 kg)  09/29/15 180 lb (81.6 kg)    General Appearance: Well nourished well developed, in no apparent distress. Eyes: PERRLA, EOMs, conjunctiva no swelling or erythema ENT/Mouth: Ear canals normal without obstruction, swelling, erythma, discharge.  TMs normal bilaterally.  Oropharynx moist, clear, without exudate, or postoropharyngeal swelling. Neck: Supple, thyroid normal,no cervical adenopathy  Respiratory: Respiratory effort normal, Breath sounds clear A&P without rhonchi, wheeze, or rale.  No retractions, no accessory usage. Cardio: RRR with no MRGs. Brisk peripheral pulses without edema.  Abdomen: Soft, + BS,  Non tender, no guarding, rebound, hernias, masses. Musculoskeletal: Full ROM, 5/5 strength, Normal gait Skin: Warm, dry without rashes, lesions, ecchymosis.  Neuro: Awake and oriented X 3, Cranial nerves intact. Normal muscle tone, no cerebellar symptoms. Psych: Normal affect, Insight and Judgment appropriate.    Starlyn Skeans, PA-C 9:20 AM [facility]

## 2016-05-31 ENCOUNTER — Other Ambulatory Visit: Payer: Self-pay | Admitting: *Deleted

## 2016-05-31 DIAGNOSIS — E782 Mixed hyperlipidemia: Secondary | ICD-10-CM

## 2016-05-31 MED ORDER — OMEPRAZOLE 20 MG PO CPDR: DELAYED_RELEASE_CAPSULE | ORAL | 1 refills | Status: DC

## 2016-05-31 MED ORDER — ROSUVASTATIN CALCIUM 40 MG PO TABS: ORAL_TABLET | ORAL | 1 refills | Status: DC

## 2016-05-31 MED ORDER — AMLODIPINE-OLMESARTAN 5-40 MG PO TABS: ORAL_TABLET | ORAL | 1 refills | Status: DC

## 2016-05-31 MED ORDER — FENOFIBRATE MICRONIZED 134 MG PO CAPS: ORAL_CAPSULE | ORAL | 1 refills | Status: DC

## 2016-05-31 MED FILL — AMLODIPINE-OLMESARTAN 5-40: 5-40 | 90 days supply | Qty: 90 | Fill #0

## 2016-05-31 MED FILL — ROSUVASTATIN CALCIUM 40 MG: 40 | 90 days supply | Qty: 90 | Fill #0

## 2016-05-31 MED FILL — FENOFIBRATE 134 MG CAPSULE: 134 | 90 days supply | Qty: 90 | Fill #0

## 2016-05-31 MED FILL — OMEPRAZOLE DR 20 MG CAPSULE: 20 | 90 days supply | Qty: 90 | Fill #0

## 2016-07-27 DIAGNOSIS — H00021 Hordeolum internum right upper eyelid: Secondary | ICD-10-CM | POA: Diagnosis not present

## 2016-08-02 ENCOUNTER — Encounter: Payer: Self-pay | Admitting: Internal Medicine

## 2016-08-02 ENCOUNTER — Ambulatory Visit (INDEPENDENT_AMBULATORY_CARE_PROVIDER_SITE_OTHER): Payer: Medicare HMO | Admitting: Internal Medicine

## 2016-08-02 VITALS — BP 116/72 | HR 65 | Temp 97.7°F | Resp 16 | Ht 72.5 in | Wt 169.6 lb

## 2016-08-02 DIAGNOSIS — Z79899 Other long term (current) drug therapy: Secondary | ICD-10-CM | POA: Diagnosis not present

## 2016-08-02 DIAGNOSIS — E782 Mixed hyperlipidemia: Secondary | ICD-10-CM

## 2016-08-02 DIAGNOSIS — I10 Essential (primary) hypertension: Secondary | ICD-10-CM | POA: Diagnosis not present

## 2016-08-02 DIAGNOSIS — R7303 Prediabetes: Secondary | ICD-10-CM | POA: Diagnosis not present

## 2016-08-02 DIAGNOSIS — K219 Gastro-esophageal reflux disease without esophagitis: Secondary | ICD-10-CM | POA: Diagnosis not present

## 2016-08-02 DIAGNOSIS — E559 Vitamin D deficiency, unspecified: Secondary | ICD-10-CM

## 2016-08-02 LAB — CBC WITH DIFFERENTIAL/PLATELET
Basophils Absolute: 46 cells/uL (ref 0–200)
Basophils Relative: 1 %
EOS PCT: 1 %
Eosinophils Absolute: 46 cells/uL (ref 15–500)
HEMATOCRIT: 46.3 % (ref 38.5–50.0)
Hemoglobin: 16.3 g/dL (ref 13.2–17.1)
LYMPHS PCT: 33 %
Lymphs Abs: 1518 cells/uL (ref 850–3900)
MCH: 36.1 pg — ABNORMAL HIGH (ref 27.0–33.0)
MCHC: 35.2 g/dL (ref 32.0–36.0)
MCV: 102.4 fL — ABNORMAL HIGH (ref 80.0–100.0)
MONO ABS: 506 {cells}/uL (ref 200–950)
MPV: 9.5 fL (ref 7.5–12.5)
Monocytes Relative: 11 %
NEUTROS PCT: 54 %
Neutro Abs: 2484 cells/uL (ref 1500–7800)
Platelets: 237 10*3/uL (ref 140–400)
RBC: 4.52 MIL/uL (ref 4.20–5.80)
RDW: 13.1 % (ref 11.0–15.0)
WBC: 4.6 10*3/uL (ref 3.8–10.8)

## 2016-08-02 LAB — TSH: TSH: 2.87 m[IU]/L (ref 0.40–4.50)

## 2016-08-02 MED ORDER — RANITIDINE HCL 300 MG PO TABS: ORAL_TABLET | ORAL | 1 refills | Status: DC

## 2016-08-02 MED FILL — raNITIdine HCL 300 MG TABS: 300 | 90 days supply | Qty: 180 | Fill #0

## 2016-08-02 NOTE — Progress Notes (Signed)
This very nice 71 y.o.  MWM presents for 3 month follow up with Hypertension, Hyperlipidemia, Pre-Diabetes and Vitamin D Deficiency. Patient has GERD controlled on Prilosec and desires to transition to Ranitidine. 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     Patient is treated for HTN (1998) & BP has been controlled at home. 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 Today's BP is at goal -116/72. Patient has had no complaints of any cardiac type chest pain, palpitations, dyspnea/orthopnea/PND, dizziness, claudication, or dependent edema.     Hyperlipidemia is almost controlled with diet & meds. Patient denies myalgias or other med SE's. Last Lipids were near goal with sl elevated Trig's: Lab Results  Component Value Date   CHOL 209 (H) 01/20/2016   HDL 69 01/20/2016   LDLCALC 103 (H) 01/20/2016   TRIG 184 (H) 01/20/2016   CHOLHDL 3.0 01/20/2016      Also, the patient has history of PreDiabetes (A1c 5.7% in 2014) and has had no symptoms of reactive hypoglycemia, diabetic polys, paresthesias or visual blurring.  Last A1c was at goal: Lab Results  Component Value Date   HGBA1C 5.3 01/20/2016      Further, the patient also has history of Vitamin D Deficiency ("13" in 2013)and supplements vitamin D without any suspected side-effects. Last vitamin D was at goal:  Lab Results  Component Value Date   VD25OH 68 01/20/2016   Current Outpatient Prescriptions on File Prior to Visit  Medication Sig  . amLODipine-olmesartan (AZOR) 5-40 MG tablet Take 1 tablet by mouth  every morning for blood  pressure  . aspirin 81 MG tablet Take 81 mg by mouth daily.  . B Complex Vitamins (VITAMIN B-COMPLEX PO) Take 1 tablet by mouth daily.  . Cholecalciferol (VITAMIN D) 2000 UNITS CAPS Take 2 capsules by mouth daily.  . cyclobenzaprine (FLEXERIL) 10 MG tablet Take 1 tablet (10 mg total) by mouth 3 (three) times daily as needed for muscle spasms.  . diclofenac  sodium (VOLTAREN) 1 % GEL Apply 2 g topically 4 (four) times daily as needed (joint pain).  . fenofibrate micronized (LOFIBRA) 134 MG capsule Take 1 capsule by mouth  every day  . Magnesium 400 MG CAPS Take 2 capsules by mouth daily.  . Omega-3 Fatty Acids (FISH OIL PO) Take by mouth.  Marland Kitchen omeprazole (PRILOSEC) 20 MG capsule Take 1 capsule by mouth  every day for acid reflux  . rosuvastatin (CRESTOR) 40 MG tablet Take 1 tablet by mouth at  bedtime  . vitamin E 1000 UNIT capsule Take 1,000 Units by mouth daily.   No current facility-administered medications on file prior to visit.    Allergies  Allergen Reactions  . Lipitor [Atorvastatin]   . Niacin And Related     Flushing  . Penicillins Rash   PMHx:   Past Medical History:  Diagnosis Date  . Adenomatous colon polyp   . GERD (gastroesophageal reflux disease)   . History of basal cell cancer 2012  . Hyperlipidemia   . Hypertension   . Prediabetes   . Prostate cancer ([place]) DX 12/19/11   bx=Adenocarcinoma,gleason=3+3=6,volume=42cc,PSA=4.63  . Vitamin D deficiency    Immunization History  Administered Date(s) Administered  . DT 10/20/2011  . Pneumococcal Polysaccharide-23 04/28/2016  . Zoster 10/20/2011   Past Surgical History:  Procedure Laterality Date  . COLONOSCOPY W/ POLYPECTOMY     Adenomatous  . CYSTOSCOPY N/A 04/04/2012   Procedure: CYSTOSCOPY;  Surgeon: Molli Hazard, MD;  Location: [facility];  Service: Urology;  Laterality: N/A;  . PROSTATE BIOPSY  12/19/2011   Procedure: BIOPSY TRANSRECTAL ULTRASONIC PROSTATE (TUBP);  Surgeon: Molli Hazard, MD;  Location: [facility];  Service: Urology;  Laterality: N/A;    . RADIOACTIVE SEED IMPLANT N/A 04/04/2012   Procedure: RADIOACTIVE SEED IMPLANT;  Surgeon: Molli Hazard, MD;  Location: [facility];  Service: Urology;  Laterality: N/A;  Seeds Implanted     72  Seeds Found in Bladder     None    FHx:     Reviewed / unchanged  SHx:    Reviewed / unchanged  Systems Review:  Constitutional: Denies fever, chills, wt changes, headaches, insomnia, fatigue, night sweats, change in appetite. Eyes: Denies redness, blurred vision, diplopia, discharge, itchy, watery eyes.  ENT: Denies discharge, congestion, post nasal drip, epistaxis, sore throat, earache, hearing loss, dental pain, tinnitus, vertigo, sinus pain, snoring.  CV: Denies chest pain, palpitations, irregular heartbeat, syncope, dyspnea, diaphoresis, orthopnea, PND, claudication or edema. Respiratory: denies cough, dyspnea, DOE, pleurisy, hoarseness, laryngitis, wheezing.  Gastrointestinal: Denies dysphagia, odynophagia, heartburn, reflux, water brash, abdominal pain or cramps, nausea, vomiting, bloating, diarrhea, constipation, hematemesis, melena, hematochezia  or hemorrhoids. Genitourinary: Denies dysuria, frequency, urgency, nocturia, hesitancy, discharge, hematuria or flank pain. Musculoskeletal: Denies arthralgias, myalgias, stiffness, jt. swelling, pain, limping or strain/sprain.  Skin: Denies pruritus, rash, hives, warts, acne, eczema or change in skin lesion(s). Neuro: No weakness, tremor, incoordination, spasms, paresthesia or pain. Psychiatric: Denies confusion, memory loss or sensory loss. Endo: Denies change in weight, skin or hair change.  Heme/Lymph: No excessive bleeding, bruising or enlarged lymph nodes.  Physical Exam  BP 116/72   Pulse 65   Temp 97.7 F (36.5 C)   Resp 16   Ht 6' 0.5" (1.842 m)   Wt 169 lb 9.6 oz (76.9 kg)   BMI 22.69 kg/m   Appears well nourished, well groomed  and in no distress.  Eyes: PERRLA, EOMs, conjunctiva no swelling or erythema. Sinuses: No frontal/maxillary tenderness ENT/Mouth: EAC's clear, TM's nl w/o erythema, bulging. Nares clear w/o erythema, swelling, exudates. Oropharynx clear without erythema or exudates. Oral hygiene is good. Tongue normal, non obstructing. Hearing intact.    Neck: Supple. Thyroid nl. Car 2+/2+ without bruits, nodes or JVD. Chest: Respirations nl with BS clear & equal w/o rales, rhonchi, wheezing or stridor.  Cor: Heart sounds normal w/ regular rate and rhythm without sig. murmurs, gallops, clicks or rubs. Peripheral pulses normal and equal  without edema.  Abdomen: Soft & bowel sounds normal. Non-tender w/o guarding, rebound, hernias, masses or organomegaly.  Lymphatics: Unremarkable.  Musculoskeletal: Full ROM all peripheral extremities, joint stability, 5/5 strength and normal gait.  Skin: Warm, dry without exposed rashes, lesions or ecchymosis apparent.  Neuro: Cranial nerves intact, reflexes equal bilaterally. Sensory-motor testing grossly intact. Tendon reflexes grossly intact.  Pysch: Alert & oriented x 3.  Insight and judgement nl & appropriate. No ideations.  Assessment and Plan:  1. Essential hypertension  - Continue medication, monitor blood pressure at home.  - Continue DASH diet. Reminder to go to the ER if any CP,  SOB, nausea, dizziness, severe HA, changes vision/speech  - CBC with Differential/Platelet - BASIC METABOLIC PANEL WITH GFR - Magnesium - TSH  2. Hyperlipidemia, mixed  - Continue diet/meds, exercise,& lifestyle modifications.  - Continue monitor periodic cholesterol/liver & renal functions  - Hepatic function panel - Lipid panel - TSH  3. Prediabetes  - Continue diet, exercise, lifestyle modifications.  - Monitor appropriate labs.  - Hemoglobin A1c - Insulin, random  4. Vitamin D deficiency  - Continue supplementation.  - VITAMIN D 25 Hydroxy   5. Gastroesophageal reflux disease  -D/C Prilosec/Omeprazole  - ranitidine (ZANTAC) 300 MG tablet; Take 1 to 2 tablets daily for heartburn & reflux to allow wean and transition from [place]: 180 tablet; Refill: 1  6. Medication management  - CBC with Differential/Platelet - BASIC METABOLIC PANEL WITH GFR - Hepatic function panel -  Magnesium - Lipid panel - TSH - Hemoglobin A1c - Insulin, random - VITAMIN D 25 Hydroxy        Discussed  regular exercise, BP monitoring, weight control to achieve/maintain BMI less than 25 and discussed med and SE's. Recommended labs to assess and monitor clinical status with further disposition pending results of labs. Over 30 minutes of exam, counseling, chart review was performed.

## 2016-08-02 NOTE — Patient Instructions (Signed)

## 2016-08-03 LAB — HEPATIC FUNCTION PANEL
ALBUMIN: 4.2 g/dL (ref 3.6–5.1)
ALT: 16 U/L (ref 9–46)
AST: 22 U/L (ref 10–35)
Alkaline Phosphatase: 32 U/L — ABNORMAL LOW (ref 40–115)
BILIRUBIN INDIRECT: 0.6 mg/dL (ref 0.2–1.2)
Bilirubin, Direct: 0.1 mg/dL (ref <=0.2)
TOTAL PROTEIN: 6.4 g/dL (ref 6.1–8.1)
Total Bilirubin: 0.7 mg/dL (ref 0.2–1.2)

## 2016-08-03 LAB — INSULIN, RANDOM: INSULIN: 6.8 u[IU]/mL (ref 2.0–19.6)

## 2016-08-03 LAB — LIPID PANEL
CHOLESTEROL: 209 mg/dL — AB (ref <200)
HDL: 61 mg/dL (ref >40)
LDL Cholesterol: 114 mg/dL — ABNORMAL HIGH (ref <100)
TRIGLYCERIDES: 170 mg/dL — AB (ref <150)
Total CHOL/HDL Ratio: 3.4 Ratio (ref <5.0)
VLDL: 34 mg/dL — AB (ref <30)

## 2016-08-03 LAB — BASIC METABOLIC PANEL WITH GFR
BUN: 28 mg/dL — ABNORMAL HIGH (ref 7–25)
CALCIUM: 10.1 mg/dL (ref 8.6–10.3)
CO2: 21 mmol/L (ref 20–31)
CREATININE: 1.5 mg/dL — AB (ref 0.70–1.18)
Chloride: 108 mmol/L (ref 98–110)
GFR, EST AFRICAN AMERICAN: 54 mL/min — AB (ref >=60)
GFR, EST NON AFRICAN AMERICAN: 46 mL/min — AB (ref >=60)
Glucose, Bld: 89 mg/dL (ref 65–99)
Potassium: 4.3 mmol/L (ref 3.5–5.3)
Sodium: 138 mmol/L (ref 135–146)

## 2016-08-03 LAB — HEMOGLOBIN A1C
Hgb A1c MFr Bld: 5.2 % (ref <5.7)
Mean Plasma Glucose: 103 mg/dL

## 2016-08-03 LAB — MAGNESIUM: MAGNESIUM: 1.8 mg/dL (ref 1.5–2.5)

## 2016-08-03 LAB — VITAMIN D 25 HYDROXY (VIT D DEFICIENCY, FRACTURES): VIT D 25 HYDROXY: 66 ng/mL (ref 30–100)

## 2016-09-21 DIAGNOSIS — H35373 Puckering of macula, bilateral: Secondary | ICD-10-CM | POA: Diagnosis not present

## 2016-09-21 DIAGNOSIS — H04123 Dry eye syndrome of bilateral lacrimal glands: Secondary | ICD-10-CM | POA: Diagnosis not present

## 2016-09-21 DIAGNOSIS — H01021 Squamous blepharitis right upper eyelid: Secondary | ICD-10-CM | POA: Diagnosis not present

## 2016-09-21 DIAGNOSIS — H00025 Hordeolum internum left lower eyelid: Secondary | ICD-10-CM | POA: Diagnosis not present

## 2016-09-21 DIAGNOSIS — H01024 Squamous blepharitis left upper eyelid: Secondary | ICD-10-CM | POA: Diagnosis not present

## 2016-09-21 DIAGNOSIS — H00021 Hordeolum internum right upper eyelid: Secondary | ICD-10-CM | POA: Diagnosis not present

## 2016-09-21 DIAGNOSIS — H01025 Squamous blepharitis left lower eyelid: Secondary | ICD-10-CM | POA: Diagnosis not present

## 2016-09-21 DIAGNOSIS — H26493 Other secondary cataract, bilateral: Secondary | ICD-10-CM | POA: Diagnosis not present

## 2016-09-21 DIAGNOSIS — H01022 Squamous blepharitis right lower eyelid: Secondary | ICD-10-CM | POA: Diagnosis not present

## 2016-09-21 DIAGNOSIS — H10413 Chronic giant papillary conjunctivitis, bilateral: Secondary | ICD-10-CM | POA: Diagnosis not present

## 2016-09-21 MED FILL — DOXYCYCLINE HYCLATE 100 MG: 100 | 7 days supply | Qty: 14 | Fill #0

## 2016-09-26 DIAGNOSIS — D1801 Hemangioma of skin and subcutaneous tissue: Secondary | ICD-10-CM | POA: Diagnosis not present

## 2016-09-26 DIAGNOSIS — L814 Other melanin hyperpigmentation: Secondary | ICD-10-CM | POA: Diagnosis not present

## 2016-09-26 DIAGNOSIS — L570 Actinic keratosis: Secondary | ICD-10-CM | POA: Diagnosis not present

## 2016-09-26 DIAGNOSIS — Z85828 Personal history of other malignant neoplasm of skin: Secondary | ICD-10-CM | POA: Diagnosis not present

## 2016-09-26 DIAGNOSIS — D0461 Carcinoma in situ of skin of right upper limb, including shoulder: Secondary | ICD-10-CM | POA: Diagnosis not present

## 2016-09-26 DIAGNOSIS — D692 Other nonthrombocytopenic purpura: Secondary | ICD-10-CM | POA: Diagnosis not present

## 2016-11-16 ENCOUNTER — Other Ambulatory Visit: Payer: Self-pay | Admitting: Internal Medicine

## 2016-11-16 DIAGNOSIS — E782 Mixed hyperlipidemia: Secondary | ICD-10-CM

## 2016-11-16 MED ORDER — ROSUVASTATIN CALCIUM 40 MG PO TABS: ORAL_TABLET | ORAL | 1 refills | Status: DC

## 2016-11-16 MED ORDER — AMLODIPINE-OLMESARTAN 5-40 MG PO TABS: ORAL_TABLET | ORAL | 1 refills | Status: DC

## 2016-11-16 MED ORDER — FENOFIBRATE MICRONIZED 134 MG PO CAPS: ORAL_CAPSULE | ORAL | 1 refills | Status: DC

## 2016-11-16 MED FILL — AMLODIPINE-OLMESARTAN 5-40: 5-40 | 90 days supply | Qty: 90 | Fill #0

## 2016-11-16 MED FILL — ROSUVASTATIN CALCIUM 40 MG: 40 | 90 days supply | Qty: 90 | Fill #0

## 2016-11-16 MED FILL — FENOFIBRATE 134 MG CAPSULE: 134 | 90 days supply | Qty: 90 | Fill #0

## 2016-11-18 ENCOUNTER — Encounter: Payer: Self-pay | Admitting: Adult Health

## 2016-11-18 ENCOUNTER — Ambulatory Visit (INDEPENDENT_AMBULATORY_CARE_PROVIDER_SITE_OTHER): Payer: Medicare HMO | Admitting: Adult Health

## 2016-11-18 VITALS — BP 138/82 | HR 62 | Temp 97.7°F | Resp 16 | Ht 72.5 in | Wt 165.0 lb

## 2016-11-18 DIAGNOSIS — E349 Endocrine disorder, unspecified: Secondary | ICD-10-CM

## 2016-11-18 DIAGNOSIS — R7303 Prediabetes: Secondary | ICD-10-CM

## 2016-11-18 DIAGNOSIS — Z87891 Personal history of nicotine dependence: Secondary | ICD-10-CM

## 2016-11-18 DIAGNOSIS — Z79899 Other long term (current) drug therapy: Secondary | ICD-10-CM

## 2016-11-18 DIAGNOSIS — Z6825 Body mass index (BMI) 25.0-25.9, adult: Secondary | ICD-10-CM

## 2016-11-18 DIAGNOSIS — E559 Vitamin D deficiency, unspecified: Secondary | ICD-10-CM

## 2016-11-18 DIAGNOSIS — K219 Gastro-esophageal reflux disease without esophagitis: Secondary | ICD-10-CM | POA: Diagnosis not present

## 2016-11-18 DIAGNOSIS — R6889 Other general symptoms and signs: Secondary | ICD-10-CM

## 2016-11-18 DIAGNOSIS — Z0000 Encounter for general adult medical examination without abnormal findings: Secondary | ICD-10-CM

## 2016-11-18 DIAGNOSIS — I10 Essential (primary) hypertension: Secondary | ICD-10-CM

## 2016-11-18 DIAGNOSIS — Z0001 Encounter for general adult medical examination with abnormal findings: Secondary | ICD-10-CM | POA: Diagnosis not present

## 2016-11-18 DIAGNOSIS — Z720 Tobacco use: Secondary | ICD-10-CM

## 2016-11-18 DIAGNOSIS — E782 Mixed hyperlipidemia: Secondary | ICD-10-CM

## 2016-11-18 DIAGNOSIS — C61 Malignant neoplasm of prostate: Secondary | ICD-10-CM

## 2016-11-18 DIAGNOSIS — M1990 Unspecified osteoarthritis, unspecified site: Secondary | ICD-10-CM

## 2016-11-18 MED ORDER — DICLOFENAC SODIUM 1 % TD GEL: 2.0000 g | Freq: Four times a day (QID) | TRANSDERMAL | 0 refills | Status: DC | PRN

## 2016-11-18 MED FILL — DICLOFENAC SODIUM 1% GEL: 1 | 20 days supply | Qty: 100 | Fill #0

## 2016-11-18 NOTE — Patient Instructions (Signed)

## 2016-11-18 NOTE — Progress Notes (Signed)
MEDICARE ANNUAL WELLNESS VISIT AND FOLLOW UP Assessment:   Juan Torres was seen today for medicare wellness and follow-up.  Diagnoses and all orders for this visit:  Essential hypertension -     CBC with Differential/Platelet -     BASIC METABOLIC PANEL WITH GFR  Mixed hyperlipidemia -     Continue medication: rosuvastatin 40 mg, fenofibrate 134 mg  -     Hepatic function panel -     TSH -     Lipid panel  Prediabetes -     Currently within normal range with lifestyle modification -     TSH -     Hemoglobin A1c  Vitamin D deficiency/ osteoporosis prophylaxis -     VITAMIN D 25 Hydroxy (Vit-D Deficiency, Fractures) -     Continue supplementation  Gastroesophageal reflux disease, esophagitis presence not specified -     Continue medications: ranitidine 300 mg -     CBC with Differential/Platelet -     BASIC METABOLIC PANEL WITH GFR  Prostate cancer (HCC)       -     PSA review stable, last undetectable       -     No urinary symptoms, continue monitoring  Testosterone deficiency      -      Fatigue significantly improved with weight loss  Continuous tobacco abuse     -       Low-dose screening CT     -       Counseled on reducing/quitting- patient not ready at this time  Arthritis -     diclofenac sodium (VOLTAREN) 1 % GEL; Apply 2 g topically 4 (four) times daily as needed (joint pain).   Over 30 minutes of exam, counseling, chart review, and critical decision making was performed  Future Appointments Date Time Provider [place]  02/20/2017 9:00 AM Unk Pinto, MD [facility] None     Plan:   During the course of the visit the patient was educated and counseled about appropriate screening and preventive services including:    Pneumococcal vaccine   Influenza vaccine  Prevnar 13  Td vaccine  Screening electrocardiogram  Colorectal cancer screening  Diabetes screening  Glaucoma screening  Nutrition counseling    Subjective:  Juan Torres is a 71 y.o. male who presents for Medicare Annual Wellness Visit and 3 month follow up for HTN, hyperlipidemia, prediabetes, and vitamin D Def.  The patient has been very active over the past year- taking walks ~1 hr daily pushing his grandson in his stroller at a brisk pace. Patient is pleased with his weight progress, recounts excellent dietary choices he is making; presents with normalized BMI- previously noted in overweight range.   Estimated body mass index is 22.07 kg/m as calculated from the following:   Height as of this encounter: 6' 0.5" (1.842 m).   Weight as of this encounter: 165 lb (74.8 kg).  Wt Readings from Last 3 Encounters:  11/18/16 165 lb (74.8 kg)  08/02/16 169 lb 9.6 oz (76.9 kg)  04/28/16 175 lb (79.4 kg)    His blood pressure has been controlled at home, today their BP is BP: 138/82  He does workout. He denies chest pain, shortness of breath, dizziness.   He is on cholesterol medication and denies myalgias. His cholesterol is not at goal. The cholesterol last visit was:   Lab Results  Component Value Date   CHOL 209 (H) 08/02/2016   HDL 61 08/02/2016  LDLCALC 114 (H) 08/02/2016   TRIG 170 (H) 08/02/2016   CHOLHDL 3.4 08/02/2016    He has been working on diet and exercise for prediabetes, and denies hyperglycemia, hypoglycemia , nausea, paresthesia of the feet, polydipsia and polyuria. Last A1C in the office was:  Lab Results  Component Value Date   HGBA1C 5.2 08/02/2016   Last GFR was stable with historical values:  Lab Results  Component Value Date   GFRNONAA 46 (L) 08/02/2016   Patient is on Vitamin D supplement and near goal:  Lab Results  Component Value Date   VD25OH 66 08/02/2016      Medication Review: Current Outpatient Prescriptions on File Prior to Visit  Medication Sig Dispense Refill  . amLODipine-olmesartan (AZOR) 5-40 MG tablet Take 1 tablet by mouth  every morning for blood  pressure 90 tablet 1  . aspirin 81 MG tablet Take  81 mg by mouth daily.    . B Complex Vitamins (VITAMIN B-COMPLEX PO) Take 1 tablet by mouth daily.    . Cholecalciferol (VITAMIN D) 2000 UNITS CAPS Take 2 capsules by mouth daily.    . cyclobenzaprine (FLEXERIL) 10 MG tablet Take 1 tablet (10 mg total) by mouth 3 (three) times daily as needed for muscle spasms. 60 tablet 0  . fenofibrate micronized (LOFIBRA) 134 MG capsule Take 1 capsule by mouth  every day 90 capsule 1  . Magnesium 400 MG CAPS Take 2 capsules by mouth daily.    . Omega-3 Fatty Acids (FISH OIL PO) Take by mouth.    . ranitidine (ZANTAC) 300 MG tablet Take 1 to 2 tablets daily for heartburn & reflux to allow wean and transition from Omaprazole 180 tablet 1  . rosuvastatin (CRESTOR) 40 MG tablet Take 1 tablet by mouth at  bedtime 90 tablet 1  . vitamin E 1000 UNIT capsule Take 1,000 Units by mouth daily.     No current facility-administered medications on file prior to visit.     Allergies: Allergies  Allergen Reactions  . Lipitor [Atorvastatin]   . Niacin And Related     Flushing  . Penicillins Rash    Current Problems (verified) has Prostate cancer ([place]); GERD (gastroesophageal reflux disease); Hyperlipidemia; Hypertension; Prediabetes; Vitamin D deficiency; Medication management; BMI 25.0-25.9,adult; and Continuous tobacco abuse on his problem list.  Screening Tests Immunization History  Administered Date(s) Administered  . DT 10/20/2011  . Pneumococcal Polysaccharide-23 04/28/2016  . Zoster 10/20/2011   Preventative care: Last colonoscopy: 12/02/2011 due 2023  Prior vaccinations: TD or Tdap: 2013  Influenza: DUE denies Pneumococcal 23: 04/28/2016 Prevnar13: due after 04/28/2017 Shingles/Zostavax: 10/20/2011  Chest CT screen: Ordered 11/17/2016  Names of Other Physician/Practitioners you currently use: 1. [facility] here for primary care 2. Dr. Katy Fitch, eye doctor, last visit  Aug 2018 3. Dr. Mariea Clonts, dentist, last  visit Sept 2018  Patient Care Team: Unk Pinto, MD as PCP - General (Internal Medicine) Inda Castle, MD as Consulting Physician (Gastroenterology) Arloa Koh, MD as Consulting Physician (Radiation Oncology) Clent Jacks, MD as Consulting Physician (Ophthalmology)  Surgical: He  has a past surgical history that includes Prostate biopsy (12/19/2011); Colonoscopy w/ polypectomy; Radioactive seed implant (N/A, 04/04/2012); and Cystoscopy (N/A, 04/04/2012). Family His family history includes Breast cancer in his mother; Cancer in his cousin, maternal grandfather, and maternal uncle. Social history  He reports that he has been smoking Cigarettes.  He has a 30.00 pack-year smoking history. He has never used smokeless tobacco. He reports  that he drinks about 21.6 oz of alcohol per week . He reports that he uses drugs, including Marijuana, about 6 times per week.  MEDICARE WELLNESS OBJECTIVES: Physical activity: Current Exercise Habits: Home exercise routine, Type of exercise: walking, Time (Minutes): 60, Frequency (Times/Week): 7, Weekly Exercise (Minutes/Week): 420, Intensity: Moderate, Exercise limited by: None identified Cardiac risk factors: Cardiac Risk Factors include: advanced age (>17men, >86 women);dyslipidemia;hypertension;male gender;smoking/ tobacco exposure Depression/mood screen:   Depression screen [facility] 2/9 11/18/2016  Decreased Interest 0  Down, Depressed, Hopeless 0  PHQ - 2 Score 0    ADLs:  In your present state of health, do you have any difficulty performing the following activities: 11/18/2016 08/02/2016  Hearing? N N  Comment - -  Vision? N N  Difficulty concentrating or making decisions? N N  Walking or climbing stairs? N N  Dressing or bathing? N N  Doing errands, shopping? N N  Some recent data might be hidden     Cognitive Testing  Alert? Yes  Normal Appearance?Yes  Oriented to person? Yes  Place? Yes   Time? Yes  Recall of three objects?   Yes  Can perform simple calculations? Yes  Displays appropriate judgment?Yes  Can read the correct time from a watch face?Yes  EOL planning: Does Patient Have a Medical Advance Directive?: No Would patient like information on creating a medical advance directive?: Yes (MAU/Ambulatory/Procedural Areas - Information given)   Objective:   Today's Vitals   11/18/16 1019  BP: 138/82  Pulse: 62  Resp: 16  Temp: 97.7 F (36.5 C)  SpO2: 99%  Weight: 165 lb (74.8 kg)  Height: 6' 0.5" (1.842 m)  PainSc: 0-No pain   Body mass index is 22.07 kg/m.  General appearance: alert, red through face/neck , no distress, WD/WN, male HEENT: normocephalic, sclerae anicteric, TMs pearly, nares patent, no discharge or erythema, pharynx normal, normal hearing with bilateral hearing aids Oral cavity: MMM, no lesions Neck: supple, no lymphadenopathy, no thyromegaly, no masses Heart: RRR, normal S1, S2, no murmurs Lungs: Symmetrical, somewhat coarse sounds throughout, not diminished, no wheezes, rhonchi, or rales Abdomen: +bs, soft, non tender, non distended, no masses, no hepatomegaly, no splenomegaly Musculoskeletal: nontender, no swelling, no obvious deformity Extremities: no edema, no cyanosis, no clubbing Pulses: 2+ symmetric, upper and lower extremities, normal cap refill Neurological: alert, oriented x 3, CN2-12 intact, strength normal upper extremities and lower extremities, sensation normal throughout, DTRs 2+ throughout, no cerebellar signs, gait normal Psychiatric: normal affect, behavior normal, pleasant   Medicare Attestation I have personally reviewed: The patient's medical and social history Their use of alcohol, tobacco or illicit drugs Their current medications and supplements The patient's functional ability including ADLs,fall risks, home safety risks, cognitive, and hearing and visual impairment Diet and physical activities Evidence for depression or mood disorders  The  patient's weight, height, BMI, and visual acuity have been recorded in the chart.  I have made referrals, counseling, and provided education to the patient based on review of the above and I have provided the patient with a written personalized care plan for preventive services.     Izora Ribas, NP   11/18/2016   ROS

## 2016-11-19 LAB — BASIC METABOLIC PANEL WITH GFR
BUN / CREAT RATIO: 15 (calc) (ref 6–22)
BUN: 36 mg/dL — AB (ref 7–25)
CALCIUM: 10.8 mg/dL — AB (ref 8.6–10.3)
CHLORIDE: 104 mmol/L (ref 98–110)
CO2: 24 mmol/L (ref 20–32)
CREATININE: 2.35 mg/dL — AB (ref 0.70–1.18)
GFR, EST AFRICAN AMERICAN: 31 mL/min/{1.73_m2} — AB (ref >=60)
GFR, Est Non African American: 27 mL/min/{1.73_m2} — ABNORMAL LOW (ref >=60)
GLUCOSE: 94 mg/dL (ref 65–99)
Potassium: 4.8 mmol/L (ref 3.5–5.3)
Sodium: 139 mmol/L (ref 135–146)

## 2016-11-19 LAB — CBC WITH DIFFERENTIAL/PLATELET
BASOS PCT: 0.7 %
Basophils Absolute: 41 cells/uL (ref 0–200)
EOS ABS: 53 {cells}/uL (ref 15–500)
Eosinophils Relative: 0.9 %
HCT: 45.9 % (ref 38.5–50.0)
HEMOGLOBIN: 15.9 g/dL (ref 13.2–17.1)
Lymphs Abs: 1221 cells/uL (ref 850–3900)
MCH: 34.7 pg — AB (ref 27.0–33.0)
MCHC: 34.6 g/dL (ref 32.0–36.0)
MCV: 100.2 fL — AB (ref 80.0–100.0)
MPV: 10 fL (ref 7.5–12.5)
Monocytes Relative: 9 %
NEUTROS ABS: 4053 {cells}/uL (ref 1500–7800)
Neutrophils Relative %: 68.7 %
PLATELETS: 236 10*3/uL (ref 140–400)
RBC: 4.58 10*6/uL (ref 4.20–5.80)
RDW: 11.7 % (ref 11.0–15.0)
TOTAL LYMPHOCYTE: 20.7 %
WBC: 5.9 10*3/uL (ref 3.8–10.8)
WBCMIX: 531 {cells}/uL (ref 200–950)

## 2016-11-19 LAB — HEPATIC FUNCTION PANEL
AG RATIO: 2.2 (calc) (ref 1.0–2.5)
ALBUMIN MSPROF: 4.8 g/dL (ref 3.6–5.1)
ALT: 16 U/L (ref 9–46)
AST: 23 U/L (ref 10–35)
Alkaline phosphatase (APISO): 26 U/L — ABNORMAL LOW (ref 40–115)
Bilirubin, Direct: 0.2 mg/dL (ref 0.0–0.2)
GLOBULIN: 2.2 g/dL (ref 1.9–3.7)
Indirect Bilirubin: 0.6 mg/dL (calc) (ref 0.2–1.2)
TOTAL PROTEIN: 7 g/dL (ref 6.1–8.1)
Total Bilirubin: 0.8 mg/dL (ref 0.2–1.2)

## 2016-11-19 LAB — VITAMIN D 25 HYDROXY (VIT D DEFICIENCY, FRACTURES): Vit D, 25-Hydroxy: 76 ng/mL (ref 30–100)

## 2016-11-19 LAB — TSH: TSH: 1.66 m[IU]/L (ref 0.40–4.50)

## 2016-11-19 LAB — HEMOGLOBIN A1C
HEMOGLOBIN A1C: 5.4 %{Hb} (ref <5.7)
Mean Plasma Glucose: 108 (calc)
eAG (mmol/L): 6 (calc)

## 2016-11-19 LAB — LIPID PANEL
CHOLESTEROL: 198 mg/dL (ref <200)
HDL: 70 mg/dL (ref >40)
LDL CHOLESTEROL (CALC): 108 mg/dL — AB
Non-HDL Cholesterol (Calc): 128 mg/dL (calc) (ref <130)
Total CHOL/HDL Ratio: 2.8 (calc) (ref <5.0)
Triglycerides: 104 mg/dL (ref <150)

## 2016-11-21 ENCOUNTER — Other Ambulatory Visit: Payer: Self-pay | Admitting: Adult Health

## 2016-11-21 DIAGNOSIS — N183 Chronic kidney disease, stage 3 unspecified: Secondary | ICD-10-CM | POA: Insufficient documentation

## 2016-11-21 DIAGNOSIS — I10 Essential (primary) hypertension: Secondary | ICD-10-CM

## 2016-11-21 DIAGNOSIS — N1831 Chronic kidney disease, stage 3a: Secondary | ICD-10-CM | POA: Insufficient documentation

## 2016-11-21 MED ORDER — AMLODIPINE BESYLATE 5 MG PO TABS: 5.0000 mg | ORAL_TABLET | Freq: Every day | ORAL | 3 refills | Status: DC

## 2016-11-21 NOTE — Progress Notes (Unsigned)
Acute decline in GFR; discontinuing combination BP medication containing ARB, continue amlodipine. Encouraging hydration, advised to stop tobacco/ETOH. Will repeat labs in 1 week. Ambulatory referral to nephrology.

## 2016-11-23 ENCOUNTER — Encounter: Payer: Self-pay | Admitting: Internal Medicine

## 2016-11-23 NOTE — Progress Notes (Signed)
LVM for pt to return office call for LAB results.

## 2016-11-28 ENCOUNTER — Other Ambulatory Visit: Payer: Self-pay

## 2016-11-28 NOTE — Progress Notes (Signed)
Pt aware of lab results & voiced understanding of those results.

## 2016-11-30 ENCOUNTER — Ambulatory Visit (HOSPITAL_COMMUNITY)
Admission: RE | Admit: 2016-11-30 | Discharge: 2016-11-30 | Disposition: A | Discharge status: Home / Self Care | Payer: Medicare HMO | Source: Ambulatory Visit | Attending: Adult Health | Admitting: Adult Health

## 2016-11-30 ENCOUNTER — Other Ambulatory Visit: Payer: Self-pay | Admitting: Adult Health

## 2016-11-30 ENCOUNTER — Other Ambulatory Visit: Payer: Self-pay | Admitting: *Deleted

## 2016-11-30 ENCOUNTER — Other Ambulatory Visit: Payer: Self-pay | Admitting: Physician Assistant

## 2016-11-30 ENCOUNTER — Other Ambulatory Visit: Payer: Medicare HMO

## 2016-11-30 DIAGNOSIS — R05 Cough: Secondary | ICD-10-CM | POA: Insufficient documentation

## 2016-11-30 DIAGNOSIS — Z720 Tobacco use: Secondary | ICD-10-CM

## 2016-11-30 DIAGNOSIS — N183 Chronic kidney disease, stage 3 unspecified: Secondary | ICD-10-CM

## 2016-11-30 DIAGNOSIS — R059 Cough, unspecified: Secondary | ICD-10-CM

## 2016-11-30 DIAGNOSIS — Z122 Encounter for screening for malignant neoplasm of respiratory organs: Secondary | ICD-10-CM | POA: Diagnosis not present

## 2016-11-30 DIAGNOSIS — Z87891 Personal history of nicotine dependence: Secondary | ICD-10-CM

## 2016-11-30 DIAGNOSIS — R918 Other nonspecific abnormal finding of lung field: Secondary | ICD-10-CM | POA: Diagnosis not present

## 2016-11-30 DIAGNOSIS — I10 Essential (primary) hypertension: Secondary | ICD-10-CM

## 2016-11-30 MED ORDER — AMLODIPINE BESYLATE 5 MG PO TABS: 5.0000 mg | ORAL_TABLET | Freq: Every day | ORAL | 3 refills | Status: DC

## 2016-11-30 MED FILL — AMLODIPINE BESYLATE 5 MG TA: 5 | 90 days supply | Qty: 90 | Fill #0

## 2016-12-01 LAB — BASIC METABOLIC PANEL WITH GFR
BUN / CREAT RATIO: 14 (calc) (ref 6–22)
BUN: 31 mg/dL — AB (ref 7–25)
CO2: 22 mmol/L (ref 20–32)
CREATININE: 2.24 mg/dL — AB (ref 0.70–1.18)
Calcium: 10.6 mg/dL — ABNORMAL HIGH (ref 8.6–10.3)
Chloride: 105 mmol/L (ref 98–110)
GFR, EST NON AFRICAN AMERICAN: 28 mL/min/{1.73_m2} — AB (ref >=60)
GFR, Est African American: 33 mL/min/{1.73_m2} — ABNORMAL LOW (ref >=60)
Glucose, Bld: 91 mg/dL (ref 65–99)
Potassium: 4.7 mmol/L (ref 3.5–5.3)
SODIUM: 136 mmol/L (ref 135–146)

## 2016-12-01 NOTE — Progress Notes (Signed)
Left message to call back  

## 2016-12-07 DIAGNOSIS — N183 Chronic kidney disease, stage 3 (moderate): Secondary | ICD-10-CM | POA: Diagnosis not present

## 2016-12-08 DIAGNOSIS — N183 Chronic kidney disease, stage 3 (moderate): Secondary | ICD-10-CM | POA: Diagnosis not present

## 2016-12-09 ENCOUNTER — Telehealth: Payer: Self-pay | Admitting: Physician Assistant

## 2016-12-09 NOTE — Telephone Encounter (Signed)
Patient arrived at CT for low dose CT lung cancer screening however he had a cough, and the were unable to do the low dose screening, so it was switched to CT without contrast for cough, smoker, and weight loss which patient has had 10 lb weight loss in 6 months.

## 2016-12-12 ENCOUNTER — Encounter: Payer: Self-pay | Admitting: Internal Medicine

## 2016-12-15 ENCOUNTER — Other Ambulatory Visit: Payer: Self-pay | Admitting: Nephrology

## 2016-12-15 DIAGNOSIS — N183 Chronic kidney disease, stage 3 unspecified: Secondary | ICD-10-CM

## 2016-12-20 NOTE — Progress Notes (Signed)
Entered in error

## 2016-12-21 ENCOUNTER — Other Ambulatory Visit: Payer: Self-pay | Admitting: Adult Health

## 2016-12-21 DIAGNOSIS — N183 Chronic kidney disease, stage 3 unspecified: Secondary | ICD-10-CM

## 2016-12-21 DIAGNOSIS — Z79899 Other long term (current) drug therapy: Secondary | ICD-10-CM

## 2016-12-22 ENCOUNTER — Ambulatory Visit: Payer: Self-pay | Admitting: Adult Health

## 2016-12-22 ENCOUNTER — Other Ambulatory Visit: Payer: Medicare HMO

## 2016-12-22 DIAGNOSIS — N183 Chronic kidney disease, stage 3 unspecified: Secondary | ICD-10-CM

## 2016-12-22 DIAGNOSIS — Z79899 Other long term (current) drug therapy: Secondary | ICD-10-CM | POA: Diagnosis not present

## 2016-12-22 LAB — HEPATIC FUNCTION PANEL
AG RATIO: 2.1 (calc) (ref 1.0–2.5)
ALT: 14 U/L (ref 9–46)
AST: 17 U/L (ref 10–35)
Albumin: 4.2 g/dL (ref 3.6–5.1)
Alkaline phosphatase (APISO): 26 U/L — ABNORMAL LOW (ref 40–115)
BILIRUBIN INDIRECT: 0.6 mg/dL (ref 0.2–1.2)
BILIRUBIN TOTAL: 0.7 mg/dL (ref 0.2–1.2)
Bilirubin, Direct: 0.1 mg/dL (ref 0.0–0.2)
GLOBULIN: 2 g/dL (ref 1.9–3.7)
TOTAL PROTEIN: 6.2 g/dL (ref 6.1–8.1)

## 2016-12-22 LAB — BASIC METABOLIC PANEL WITH GFR
BUN/Creatinine Ratio: 14 (calc) (ref 6–22)
BUN: 23 mg/dL (ref 7–25)
CALCIUM: 10 mg/dL (ref 8.6–10.3)
CHLORIDE: 108 mmol/L (ref 98–110)
CO2: 25 mmol/L (ref 20–32)
CREATININE: 1.69 mg/dL — AB (ref 0.70–1.18)
GFR, EST AFRICAN AMERICAN: 46 mL/min/{1.73_m2} — AB (ref >=60)
GFR, Est Non African American: 40 mL/min/{1.73_m2} — ABNORMAL LOW (ref >=60)
Glucose, Bld: 125 mg/dL — ABNORMAL HIGH (ref 65–99)
Potassium: 4.3 mmol/L (ref 3.5–5.3)
Sodium: 141 mmol/L (ref 135–146)

## 2016-12-22 LAB — CBC WITH DIFFERENTIAL/PLATELET
Basophils Absolute: 32 cells/uL (ref 0–200)
Basophils Relative: 0.7 %
Eosinophils Absolute: 78 cells/uL (ref 15–500)
Eosinophils Relative: 1.7 %
HCT: 45.2 % (ref 38.5–50.0)
Hemoglobin: 15.8 g/dL (ref 13.2–17.1)
Lymphs Abs: 1182 cells/uL (ref 850–3900)
MCH: 34.7 pg — ABNORMAL HIGH (ref 27.0–33.0)
MCHC: 35 g/dL (ref 32.0–36.0)
MCV: 99.3 fL (ref 80.0–100.0)
MPV: 9.8 fL (ref 7.5–12.5)
Monocytes Relative: 11.5 %
Neutro Abs: 2778 cells/uL (ref 1500–7800)
Neutrophils Relative %: 60.4 %
Platelets: 260 10*3/uL (ref 140–400)
RBC: 4.55 10*6/uL (ref 4.20–5.80)
RDW: 12.1 % (ref 11.0–15.0)
Total Lymphocyte: 25.7 %
WBC mixed population: 529 cells/uL (ref 200–950)
WBC: 4.6 10*3/uL (ref 3.8–10.8)

## 2017-01-03 ENCOUNTER — Ambulatory Visit
Admission: RE | Admit: 2017-01-03 | Discharge: 2017-01-03 | Disposition: A | Discharge status: Home / Self Care | Payer: Medicare HMO | Source: Ambulatory Visit | Attending: Nephrology | Admitting: Nephrology

## 2017-01-03 DIAGNOSIS — N183 Chronic kidney disease, stage 3 unspecified: Secondary | ICD-10-CM

## 2017-01-03 DIAGNOSIS — N189 Chronic kidney disease, unspecified: Secondary | ICD-10-CM | POA: Diagnosis not present

## 2017-02-20 ENCOUNTER — Encounter: Payer: Self-pay | Admitting: Internal Medicine

## 2017-02-20 ENCOUNTER — Ambulatory Visit (INDEPENDENT_AMBULATORY_CARE_PROVIDER_SITE_OTHER): Payer: Medicare HMO | Admitting: Internal Medicine

## 2017-02-20 VITALS — BP 128/84 | HR 68 | Temp 97.4°F | Resp 16 | Ht 72.0 in | Wt 172.6 lb

## 2017-02-20 DIAGNOSIS — E559 Vitamin D deficiency, unspecified: Secondary | ICD-10-CM

## 2017-02-20 DIAGNOSIS — R7309 Other abnormal glucose: Secondary | ICD-10-CM

## 2017-02-20 DIAGNOSIS — Z0001 Encounter for general adult medical examination with abnormal findings: Secondary | ICD-10-CM

## 2017-02-20 DIAGNOSIS — Z125 Encounter for screening for malignant neoplasm of prostate: Secondary | ICD-10-CM

## 2017-02-20 DIAGNOSIS — Z0000 Encounter for general adult medical examination without abnormal findings: Secondary | ICD-10-CM | POA: Diagnosis not present

## 2017-02-20 DIAGNOSIS — Z136 Encounter for screening for cardiovascular disorders: Secondary | ICD-10-CM | POA: Diagnosis not present

## 2017-02-20 DIAGNOSIS — I10 Essential (primary) hypertension: Secondary | ICD-10-CM

## 2017-02-20 DIAGNOSIS — R7303 Prediabetes: Secondary | ICD-10-CM

## 2017-02-20 DIAGNOSIS — Z8546 Personal history of malignant neoplasm of prostate: Secondary | ICD-10-CM

## 2017-02-20 DIAGNOSIS — K219 Gastro-esophageal reflux disease without esophagitis: Secondary | ICD-10-CM

## 2017-02-20 DIAGNOSIS — Z1211 Encounter for screening for malignant neoplasm of colon: Secondary | ICD-10-CM

## 2017-02-20 DIAGNOSIS — N183 Chronic kidney disease, stage 3 unspecified: Secondary | ICD-10-CM

## 2017-02-20 DIAGNOSIS — Z79899 Other long term (current) drug therapy: Secondary | ICD-10-CM

## 2017-02-20 DIAGNOSIS — Z1212 Encounter for screening for malignant neoplasm of rectum: Secondary | ICD-10-CM

## 2017-02-20 DIAGNOSIS — E782 Mixed hyperlipidemia: Secondary | ICD-10-CM | POA: Diagnosis not present

## 2017-02-20 DIAGNOSIS — F17200 Nicotine dependence, unspecified, uncomplicated: Secondary | ICD-10-CM

## 2017-02-20 MED ORDER — ESOMEPRAZOLE MAGNESIUM 20 MG PO CPDR: DELAYED_RELEASE_CAPSULE | ORAL | Status: DC

## 2017-02-20 NOTE — Progress Notes (Signed)
[facility]   [facility], M.D.     [facility]. Juan [facility], P.A.-C Juan Torres, Juan Torres                [address] [place], [place] [address] Telephone [phone] Telefax [phone] Annual  Screening/Preventative Visit  & Comprehensive Evaluation & Examination     This very nice 72 y.o. MWM presents for a Screening/Preventative Visit & comprehensive evaluation and management of multiple medical co-morbidities.  Patient has been followed for HTN, Prediabetes, Hyperlipidemia and Vitamin D Deficiency. Patient has hx/o GERD stable/controlled w/prudent diet & he switched his Ranitidine to OTC Nexium 20 mg. Also he was treated for Prostate Cancer by Brachytherapy in 2014 by Dr 's Juan Torres.      HTN predates since [phone]. Patient's BP has been controlled at home.  Today's BP is at goal - 128/84.  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 Patient denies any cardiac symptoms as chest pain, palpitations, shortness of breath, dizziness or ankle swelling.     Patient's hyperlipidemia is not controlled with diet and sporadic use of medications. Patient denies myalgias or other medication SE's. Last lipids were not at goal: Lab Results  Component Value Date   CHOL 198 11/18/2016   HDL 70 11/18/2016   LDLCALC 114 (H) 08/02/2016   TRIG 104 11/18/2016   CHOLHDL 2.8 11/18/2016      Patient has prediabetes (A1c 5.7%/2014)  and patient denies reactive hypoglycemic symptoms, visual blurring, diabetic polys or paresthesias. Last A1c was Normal & at goal: Lab Results  Component Value Date   HGBA1C 5.4 11/18/2016       Finally, patient has history of Vitamin D Deficiency ("13"/2013)  and last vitamin D was at goal: Lab Results  Component Value Date   VD25OH 76 11/18/2016   Current Outpatient Medications on File Prior to Visit  Medication Sig  . amLODipine (NORVASC) 5 MG  tablet Take 1 tablet (5 mg total) by mouth daily.  Marland Kitchen aspirin 81 MG tablet Take 81 mg by mouth daily.  . B Complex Vitamins (VITAMIN B-COMPLEX PO) Take 1 tablet by mouth daily.  . Cholecalciferol (VITAMIN D) 2000 UNITS CAPS Take 2 capsules by mouth daily.  . cyclobenzaprine (FLEXERIL) 10 MG tablet Take 1 tablet (10 mg total) by mouth 3 (three) times daily as needed for muscle spasms.  . diclofenac sodium (VOLTAREN) 1 % GEL Apply 2 g topically 4 (four) times daily as needed (joint pain).  . fenofibrate micronized (LOFIBRA) 134 MG capsule Take 1 capsule by mouth  every day  . Magnesium 400 MG CAPS Take 2 capsules by mouth daily.  . Omega-3 Fatty Acids (FISH OIL PO) Take by mouth.  Marland Kitchen OVER THE COUNTER MEDICATION Taking OTC Nexium daily  . rosuvastatin (CRESTOR) 40 MG tablet Take 1 tablet by mouth at  bedtime  . vitamin E 1000 UNIT capsule Take 1,000 Units by mouth daily.  . ranitidine (ZANTAC) 300 MG tablet Take 1 to 2 tablets daily for heartburn & reflux to allow wean and transition from Juan Torres   No current facility-administered medications on file prior to visit.    Allergies  Allergen Reactions  . Lipitor [Atorvastatin]   . Niacin And Related     Flushing  . Penicillins Rash   Past Medical History:  Diagnosis Date  .  Adenomatous colon polyp   . GERD (gastroesophageal reflux disease)   . History of basal cell cancer 2012  . Hyperlipidemia   . Hypertension   . Prediabetes   . Prostate cancer ([place]) DX 12/19/11   bx=Adenocarcinoma,gleason=3+3=6,volume=42cc,PSA=4.63  . Vitamin D deficiency    Health Maintenance  Topic Date Due  . Hepatitis C Screening  13-Jan-1946  . INFLUENZA VACCINE  09/07/2016  . TETANUS/TDAP  10/19/2021  . COLONOSCOPY  12/01/2021  . PNA vac Low Risk Adult  Addressed   Immunization History  Administered Date(s) Administered  . DT 10/20/2011  . Pneumococcal Polysaccharide-23 04/28/2016  . Zoster 10/20/2011   Last Colon - 12/02/2011 Dr Deatra Ina - f/u 10  years  Past Surgical History:  Procedure Laterality Date  . COLONOSCOPY W/ POLYPECTOMY     Adenomatous  . CYSTOSCOPY N/A 04/04/2012   Procedure: CYSTOSCOPY;  Surgeon: Molli Hazard, MD;  [facility];  Service: Urology;  Laterality: N/A;  . PROSTATE BIOPSY  12/19/2011   Procedure: BIOPSY TRANSRECTAL ULTRASONIC PROSTATE (TUBP);  Surgeon: Molli Hazard, MD;  Location: [facility];  Service: Urology;  Laterality: N/A;    . RADIOACTIVE SEED IMPLANT N/A 04/04/2012   Procedure: RADIOACTIVE SEED IMPLANT;  Surgeon: Molli Hazard, MD;  Location: [facility];  Service: Urology;  Laterality: N/A;  Seeds Implanted     72  Seeds Found in Bladder     None    Family History  Problem Relation Age of Onset  . Breast cancer Mother   . Cancer Maternal Grandfather        colon  . Cancer Maternal Uncle        prostate  . Cancer Cousin        prostyae cancer 1st maternal    Social History   Socioeconomic History  . Marital status: Married    Spouse name: Not on file  . Number of children: 2  Occupational History  . Retired Neurosurgeon   Tobacco Use  . Smoking status: Current Every Day Smoker    Packs/day: 1.00    Years: 30.00    Pack years: 30.00    Types: Cigarettes  . Smokeless tobacco: Never Used  . Tobacco comment: smokes cigarettes and marijuana nearly daily  Substance and Sexual Activity  . Alcohol use: Yes    Alcohol/week: 21.6 oz    Types: 36 Cans of beer per week    Comment: 4-6 beers daily, tequila  . Drug use: Yes    Frequency: 6.0 times per week    Types: Marijuana    Comment: / marijuana  . Sexual activity: Not on file    ROS Constitutional: Denies fever, chills, weight loss/gain, headaches, insomnia,  night sweats or change in appetite. Does c/o fatigue. Eyes: Denies redness, blurred vision, diplopia, discharge, itchy or watery eyes.  ENT: Denies discharge, congestion, post nasal drip, epistaxis, sore  throat, earache, hearing loss, dental pain, Tinnitus, Vertigo, Sinus pain or snoring.  Cardio: Denies chest pain, palpitations, irregular heartbeat, syncope, dyspnea, diaphoresis, orthopnea, PND, claudication or edema Respiratory: denies cough, dyspnea, DOE, pleurisy, hoarseness, laryngitis or wheezing.  Gastrointestinal: Denies dysphagia, heartburn, reflux, water brash, pain, cramps, nausea, vomiting, bloating, diarrhea, constipation, hematemesis, melena, hematochezia, jaundice or hemorrhoids Genitourinary: Denies dysuria, frequency, urgency, nocturia, hesitancy, discharge, hematuria or flank pain Musculoskeletal: Denies arthralgia, myalgia, stiffness, Jt. Swelling, pain, limp or strain/sprain. Denies Falls. Skin: Denies puritis, rash, hives, warts, acne, eczema or change in skin lesion Neuro: No weakness,  tremor, incoordination, spasms, paresthesia or pain Psychiatric: Denies confusion, memory loss or sensory loss. Denies Depression. Endocrine: Denies change in weight, skin, hair change, nocturia, and paresthesia, diabetic polys, visual blurring or hyper / hypo glycemic episodes.  Heme/Lymph: No excessive bleeding, bruising or enlarged lymph nodes.  Physical Exam  BP 128/84   Pulse 68   Temp (!) 97.4 F (36.3 C)   Resp 16   Ht 6' (1.829 m)   Wt 172 lb 9.6 oz (78.3 kg)   BMI 23.41 kg/m   General Appearance: Well nourished and well groomed and in no apparent distress.  Eyes: PERRLA, EOMs, conjunctiva no swelling or erythema, normal fundi and vessels. Sinuses: No frontal/maxillary tenderness ENT/Mouth: EACs patent / TMs  nl. Nares clear without erythema, swelling, mucoid exudates. Oral hygiene is good. No erythema, swelling, or exudate. Tongue normal, non-obstructing. Tonsils not swollen or erythematous. Hearing normal.  Neck: Supple, thyroid normal. No bruits, nodes or JVD. Respiratory: Respiratory effort normal.  BS equal and clear bilateral without rales, rhonci, wheezing or  stridor. Cardio: Heart sounds are normal with regular rate and rhythm and no murmurs, rubs or gallops. Peripheral pulses are normal and equal bilaterally without edema. No aortic or femoral bruits. Chest: symmetric with normal excursions and percussion.  Abdomen: Soft, with Nl bowel sounds. Nontender, no guarding, rebound, hernias, masses, or organomegaly.  Lymphatics: Non tender without lymphadenopathy.  Genitourinary: No hernias.Testes nl. DRE - prostate nl for age - smooth & firm w/o nodules. Musculoskeletal: Full ROM all peripheral extremities, joint stability, 5/5 strength, and normal gait. Skin: Warm and dry without rashes, lesions, cyanosis, clubbing or  ecchymosis.  Neuro: Cranial nerves intact, reflexes equal bilaterally. Normal muscle tone, no cerebellar symptoms. Sensation intact.  Pysch: Alert and oriented X 3 with normal affect, insight and judgment appropriate.   Assessment and Plan  1. Annual Preventative/Screening Exam   2. Essential hypertension  - EKG 12-Lead - [place], RETROPERITNL ABD,  LTD - Urinalysis, Routine w reflex microscopic - Microalbumin / creatinine urine ratio - CBC with Differential/Platelet - BASIC METABOLIC PANEL WITH GFR - Magnesium - TSH  3. Hyperlipidemia, mixed  - EKG 12-Lead - [place], RETROPERITNL ABD,  LTD - Hepatic function panel - Lipid panel - TSH  4. Prediabetes  - EKG 12-Lead - [place], RETROPERITNL ABD,  LTD - Hemoglobin A1c - Insulin, random  5. Vitamin D deficiency  - VITAMIN D 25 Hydroxy (  6. CKD (chronic kidney disease) stage 3, GFR 30-59 ml/min (HCC)  - Urinalysis, Routine w reflex microscopic - Microalbumin / creatinine urine ratio - BASIC METABOLIC PANEL WITH GFR  7. Abnormal glucose  - Hemoglobin A1c - Insulin, random  8. Gastroesophageal reflux disease  - POC Hemoccult Bld/Stl  - CBC with Differential/Platelet - esomeprazole (NEXIUM) 20 MG capsule; Take 1 capsule daily for Heartburn  9. Screening for colorectal  cancer  - POC Hemoccult Bld/Stl (3-Cd Home Screen); Future  10. Prostate cancer screening  - PSA  11. History of prostate cancer  - PSA  12. Screening for ischemic heart disease  - EKG 12-Lead  13. Screening for AAA (aortic abdominal aneurysm)  - [place], RETROPERITNL ABD,  LTD  14. Smoker  - EKG 12-Lead - [place], RETROPERITNL ABD,  LTD  15. Medication management  - Urinalysis, Routine w reflex microscopic - Microalbumin / creatinine urine ratio - CBC with Differential/Platelet - BASIC METABOLIC PANEL WITH GFR - Hepatic function panel - Magnesium - Lipid panel - TSH - Hemoglobin A1c - Insulin, random -  VITAMIN D 25 Hydroxyl         Patient was counseled in prudent diet, weight control to achieve/maintain BMI less than 25, BP monitoring, regular exercise and medications as discussed.  Discussed med effects and SE's. Routine screening labs and tests as requested with regular follow-up as recommended. Over 40 minutes of exam, counseling, chart review and high complex critical decision making was performed

## 2017-02-20 NOTE — Patient Instructions (Signed)

## 2017-02-21 LAB — HEPATIC FUNCTION PANEL
AG RATIO: 2.2 (calc) (ref 1.0–2.5)
ALT: 13 U/L (ref 9–46)
AST: 15 U/L (ref 10–35)
Albumin: 4.4 g/dL (ref 3.6–5.1)
Alkaline phosphatase (APISO): 36 U/L — ABNORMAL LOW (ref 40–115)
Bilirubin, Direct: 0.2 mg/dL (ref 0.0–0.2)
GLOBULIN: 2 g/dL (ref 1.9–3.7)
Indirect Bilirubin: 0.6 mg/dL (calc) (ref 0.2–1.2)
TOTAL PROTEIN: 6.4 g/dL (ref 6.1–8.1)
Total Bilirubin: 0.8 mg/dL (ref 0.2–1.2)

## 2017-02-21 LAB — BASIC METABOLIC PANEL WITH GFR
BUN/Creatinine Ratio: 14 (calc) (ref 6–22)
BUN: 20 mg/dL (ref 7–25)
CO2: 29 mmol/L (ref 20–32)
CREATININE: 1.44 mg/dL — AB (ref 0.70–1.18)
Calcium: 9.8 mg/dL (ref 8.6–10.3)
Chloride: 106 mmol/L (ref 98–110)
GFR, Est African American: 56 mL/min/{1.73_m2} — ABNORMAL LOW (ref >=60)
GFR, Est Non African American: 49 mL/min/{1.73_m2} — ABNORMAL LOW (ref >=60)
GLUCOSE: 94 mg/dL (ref 65–99)
Potassium: 4.5 mmol/L (ref 3.5–5.3)
Sodium: 141 mmol/L (ref 135–146)

## 2017-02-21 LAB — HEMOGLOBIN A1C
EAG (MMOL/L): 5.5 (calc)
Hgb A1c MFr Bld: 5.1 % of total Hgb (ref <5.7)
Mean Plasma Glucose: 100 (calc)

## 2017-02-21 LAB — VITAMIN D 25 HYDROXY (VIT D DEFICIENCY, FRACTURES): VIT D 25 HYDROXY: 61 ng/mL (ref 30–100)

## 2017-02-21 LAB — LIPID PANEL
CHOL/HDL RATIO: 3.8 (calc) (ref <5.0)
CHOLESTEROL: 233 mg/dL — AB (ref <200)
HDL: 62 mg/dL (ref >40)
LDL CHOLESTEROL (CALC): 151 mg/dL — AB
NON-HDL CHOLESTEROL (CALC): 171 mg/dL — AB (ref <130)
TRIGLYCERIDES: 91 mg/dL (ref <150)

## 2017-02-21 LAB — URINALYSIS, ROUTINE W REFLEX MICROSCOPIC
BILIRUBIN URINE: NEGATIVE
Glucose, UA: NEGATIVE
HGB URINE DIPSTICK: NEGATIVE
Ketones, ur: NEGATIVE
Leukocytes, UA: NEGATIVE
Nitrite: NEGATIVE
PROTEIN: NEGATIVE
Specific Gravity, Urine: 1.02 (ref 1.001–1.03)
pH: 5.5 (ref 5.0–8.0)

## 2017-02-21 LAB — CBC WITH DIFFERENTIAL/PLATELET
BASOS ABS: 31 {cells}/uL (ref 0–200)
Basophils Relative: 0.5 %
EOS ABS: 112 {cells}/uL (ref 15–500)
EOS PCT: 1.8 %
HEMATOCRIT: 51.5 % — AB (ref 38.5–50.0)
HEMOGLOBIN: 18 g/dL — AB (ref 13.2–17.1)
Lymphs Abs: 1618 cells/uL (ref 850–3900)
MCH: 34.2 pg — AB (ref 27.0–33.0)
MCHC: 35 g/dL (ref 32.0–36.0)
MCV: 97.9 fL (ref 80.0–100.0)
MONOS PCT: 8.7 %
MPV: 10 fL (ref 7.5–12.5)
NEUTROS ABS: 3900 {cells}/uL (ref 1500–7800)
Neutrophils Relative %: 62.9 %
Platelets: 217 10*3/uL (ref 140–400)
RBC: 5.26 10*6/uL (ref 4.20–5.80)
RDW: 11.8 % (ref 11.0–15.0)
Total Lymphocyte: 26.1 %
WBC mixed population: 539 cells/uL (ref 200–950)
WBC: 6.2 10*3/uL (ref 3.8–10.8)

## 2017-02-21 LAB — MICROALBUMIN / CREATININE URINE RATIO
CREATININE, URINE: 138 mg/dL (ref 20–320)
Microalb Creat Ratio: 2 mcg/mg creat (ref <30)
Microalb, Ur: 0.3 mg/dL

## 2017-02-21 LAB — INSULIN, RANDOM: INSULIN: 4.6 u[IU]/mL (ref 2.0–19.6)

## 2017-02-21 LAB — MAGNESIUM: Magnesium: 2 mg/dL (ref 1.5–2.5)

## 2017-02-21 LAB — PSA: PSA: <0.1 ng/mL (ref <=4.0)

## 2017-02-21 LAB — TSH: TSH: 3.77 mIU/L (ref 0.40–4.50)

## 2017-03-29 DIAGNOSIS — Z85828 Personal history of other malignant neoplasm of skin: Secondary | ICD-10-CM | POA: Diagnosis not present

## 2017-03-29 DIAGNOSIS — D692 Other nonthrombocytopenic purpura: Secondary | ICD-10-CM | POA: Diagnosis not present

## 2017-03-29 DIAGNOSIS — D225 Melanocytic nevi of trunk: Secondary | ICD-10-CM | POA: Diagnosis not present

## 2017-03-29 DIAGNOSIS — L570 Actinic keratosis: Secondary | ICD-10-CM | POA: Diagnosis not present

## 2017-03-29 DIAGNOSIS — L821 Other seborrheic keratosis: Secondary | ICD-10-CM | POA: Diagnosis not present

## 2017-03-29 DIAGNOSIS — D0471 Carcinoma in situ of skin of right lower limb, including hip: Secondary | ICD-10-CM | POA: Diagnosis not present

## 2017-03-29 DIAGNOSIS — D1801 Hemangioma of skin and subcutaneous tissue: Secondary | ICD-10-CM | POA: Diagnosis not present

## 2017-04-14 DIAGNOSIS — D0471 Carcinoma in situ of skin of right lower limb, including hip: Secondary | ICD-10-CM | POA: Diagnosis not present

## 2017-04-14 DIAGNOSIS — Z85828 Personal history of other malignant neoplasm of skin: Secondary | ICD-10-CM | POA: Diagnosis not present

## 2017-05-01 MED FILL — AMLODIPINE BESYLATE 5 MG TA: 5 | 90 days supply | Qty: 90 | Fill #1

## 2017-05-01 MED FILL — ROSUVASTATIN CALCIUM 40 MG: 40 | 90 days supply | Qty: 90 | Fill #1

## 2017-05-01 MED FILL — FENOFIBRATE 134 MG CAPSULE: 134 | 90 days supply | Qty: 90 | Fill #1

## 2017-06-01 ENCOUNTER — Ambulatory Visit: Payer: Self-pay | Admitting: Adult Health

## 2017-06-08 NOTE — Progress Notes (Signed)
FOLLOW UP  Assessment and Plan:   Hypertension Well controlled with current medications  Monitor blood pressure at home; patient to call if consistently greater than 130/80 Continue DASH diet.   Reminder to go to the ER if any CP, SOB, nausea, dizziness, severe HA, changes vision/speech, left arm numbness and tingling and jaw pain.  Cholesterol Continue high intensity statin and fenofibrate - above goal this past time, but typically fairly near goal Diet discussed at length, and to start taking medication in the evening.  Continue low cholesterol diet and exercise.  Check lipid panel.   Other abnormal glucose Recent A1Cs at goal Discussed diet/exercise, weight management  Defer A1C; check BMP  BMI 23 Continue to recommend diet heavy in fruits and veggies and low in animal meats, cheeses, and dairy products, appropriate calorie intake Discuss exercise recommendations routinely Continue to monitor weight at each visit  Vitamin D Def At goal at last visit; continue supplementation to maintain goal of 70-100 Defer Vit D level  GERD Well managed on current medications Discussed diet, avoiding triggers and other lifestyle changes   Continue diet and meds as discussed. Further disposition pending results of labs. Discussed med's effects and SE's.   Over 30 minutes of exam, counseling, chart review, and critical decision making was performed.   Future Appointments  Date Time Provider [place]  09/04/2017  9:30 AM Unk Pinto, MD [facility] None  03/08/2018  9:00 AM Unk Pinto, MD [facility] None    ----------------------------------------------------------------------------------------------------------------------  HPI 72 y.o. male  presents for 3 month follow up on hypertension, cholesterol, glucose management, weight and vitamin D deficiency.   he currently continues to smoke 1 pack a day; discussed risks associated with smoking, patient is not ready to  quit.   he has a diagnosis of GERD which is currently managed by nexium 20 mg daily he reports symptoms is currently well controlled, and denies breakthrough reflux, burning in chest, hoarseness or cough.    BMI is Body mass index is 23.65 kg/m., he has been working on diet and exercise. Wt Readings from Last 3 Encounters:  06/09/17 174 lb 6.4 oz (79.1 kg)  02/20/17 172 lb 9.6 oz (78.3 kg)  11/18/16 165 lb (74.8 kg)   His blood pressure has been controlled at home, today their BP is BP: 122/76  He does workout. He denies chest pain, shortness of breath, dizziness.   He is on cholesterol medication (crestor 40 mg daily, fenofibrate) and denies myalgias. His cholesterol is not at goal. The cholesterol last visit was:   Lab Results  Component Value Date   CHOL 233 (H) 02/20/2017   HDL 62 02/20/2017   LDLCALC 151 (H) 02/20/2017   TRIG 91 02/20/2017   CHOLHDL 3.8 02/20/2017    He has been working on diet and exercise for glucose management, and denies foot ulcerations, increased appetite, nausea, paresthesia of the feet, polydipsia, polyuria, visual disturbances, vomiting and weight loss. Last A1C in the office was:  Lab Results  Component Value Date   HGBA1C 5.1 02/20/2017   Patient is on Vitamin D supplement.   Lab Results  Component Value Date   VD25OH 61 02/20/2017        Current Medications:  Current Outpatient Medications on File Prior to Visit  Medication Sig  . amLODipine (NORVASC) 5 MG tablet Take 1 tablet (5 mg total) by mouth daily.  Marland Kitchen aspirin 81 MG tablet Take 81 mg by mouth daily.  . B Complex Vitamins (VITAMIN B-COMPLEX PO)  Take 1 tablet by mouth daily.  . Cholecalciferol (VITAMIN D) 2000 UNITS CAPS Take 2 capsules by mouth daily.  . cyclobenzaprine (FLEXERIL) 10 MG tablet Take 1 tablet (10 mg total) by mouth 3 (three) times daily as needed for muscle spasms.  . diclofenac sodium (VOLTAREN) 1 % GEL Apply 2 g topically 4 (four) times daily as needed (joint pain).   Marland Kitchen esomeprazole (NEXIUM) 20 MG capsule Take 1 capsule daily for Heartburn  . fenofibrate micronized (LOFIBRA) 134 MG capsule Take 1 capsule by mouth  every day  . Magnesium 400 MG CAPS Take 2 capsules by mouth daily.  . Omega-3 Fatty Acids (FISH OIL PO) Take by mouth.  Marland Kitchen OVER THE COUNTER MEDICATION Taking OTC Nexium daily  . rosuvastatin (CRESTOR) 40 MG tablet Take 1 tablet by mouth at  bedtime  . vitamin E 1000 UNIT capsule Take 1,000 Units by mouth daily.   No current facility-administered medications on file prior to visit.      Allergies:  Allergies  Allergen Reactions  . Lipitor [Atorvastatin]   . Niacin And Related     Flushing  . Penicillins Rash     Medical History:  Past Medical History:  Diagnosis Date  . Adenomatous colon polyp   . GERD (gastroesophageal reflux disease)   . History of basal cell cancer 2012  . Hyperlipidemia   . Hypertension   . Prediabetes   . Prostate cancer ([place]) DX 12/19/11   bx=Adenocarcinoma,gleason=3+3=6,volume=42cc,PSA=4.63  . Vitamin D deficiency    Family history- Reviewed and unchanged Social history- Reviewed and unchanged   Review of Systems:  Review of Systems  Constitutional: Negative for malaise/fatigue and weight loss.  HENT: Negative for hearing loss and tinnitus.   Eyes: Negative for blurred vision and double vision.  Respiratory: Negative for cough, shortness of breath and wheezing.   Cardiovascular: Negative for chest pain, palpitations, orthopnea, claudication and leg swelling.  Gastrointestinal: Negative for abdominal pain, blood in stool, constipation, diarrhea, heartburn, melena, nausea and vomiting.  Genitourinary: Negative.   Musculoskeletal: Negative for joint pain and myalgias.  Skin: Negative for rash.  Neurological: Negative for dizziness, tingling, sensory change, weakness and headaches.  Endo/Heme/Allergies: Negative for polydipsia.  Psychiatric/Behavioral: Negative.   All other systems reviewed and are  negative.     Physical Exam: BP 122/76   Pulse 68   Temp 97.9 F (36.6 C)   Resp 16   Ht 6' (1.829 m)   Wt 174 lb 6.4 oz (79.1 kg)   SpO2 97%   BMI 23.65 kg/m  Wt Readings from Last 3 Encounters:  06/09/17 174 lb 6.4 oz (79.1 kg)  02/20/17 172 lb 9.6 oz (78.3 kg)  11/18/16 165 lb (74.8 kg)   General Appearance: Well nourished, in no apparent distress. Eyes: PERRLA, EOMs, conjunctiva no swelling or erythema Sinuses: No Frontal/maxillary tenderness ENT/Mouth: Ext aud canals clear, TMs without erythema, bulging. No erythema, swelling, or exudate on post pharynx.  Tonsils not swollen or erythematous. Hearing normal.  Neck: Supple, thyroid normal.  Respiratory: Respiratory effort normal, BS equal bilaterally without rales, rhonchi, wheezing or stridor.  Cardio: RRR with no MRGs. Brisk peripheral pulses without edema.  Abdomen: Soft, + BS.  Non tender, no guarding, rebound, hernias, masses. Lymphatics: Non tender without lymphadenopathy.  Musculoskeletal: Full ROM, 5/5 strength, Normal gait Skin: Warm, dry without rashes, lesions, ecchymosis.  Neuro: Cranial nerves intact. No cerebellar symptoms.  Psych: Awake and oriented X 3, normal affect, Insight and Judgment appropriate.  Izora Ribas, NP 9:26 AM [facility]

## 2017-06-09 ENCOUNTER — Encounter: Payer: Self-pay | Admitting: Adult Health

## 2017-06-09 ENCOUNTER — Ambulatory Visit: Payer: Medicare HMO | Admitting: Adult Health

## 2017-06-09 VITALS — BP 122/76 | HR 68 | Temp 97.9°F | Resp 16 | Ht 72.0 in | Wt 174.4 lb

## 2017-06-09 DIAGNOSIS — Z8639 Personal history of other endocrine, nutritional and metabolic disease: Secondary | ICD-10-CM

## 2017-06-09 DIAGNOSIS — N183 Chronic kidney disease, stage 3 unspecified: Secondary | ICD-10-CM

## 2017-06-09 DIAGNOSIS — Z6823 Body mass index (BMI) 23.0-23.9, adult: Secondary | ICD-10-CM

## 2017-06-09 DIAGNOSIS — E559 Vitamin D deficiency, unspecified: Secondary | ICD-10-CM | POA: Diagnosis not present

## 2017-06-09 DIAGNOSIS — Z79899 Other long term (current) drug therapy: Secondary | ICD-10-CM

## 2017-06-09 DIAGNOSIS — Z720 Tobacco use: Secondary | ICD-10-CM

## 2017-06-09 DIAGNOSIS — K219 Gastro-esophageal reflux disease without esophagitis: Secondary | ICD-10-CM

## 2017-06-09 DIAGNOSIS — E782 Mixed hyperlipidemia: Secondary | ICD-10-CM | POA: Diagnosis not present

## 2017-06-09 DIAGNOSIS — I10 Essential (primary) hypertension: Secondary | ICD-10-CM | POA: Diagnosis not present

## 2017-06-09 LAB — LIPID PANEL
CHOL/HDL RATIO: 2.8 (calc) (ref <5.0)
CHOLESTEROL: 193 mg/dL (ref <200)
HDL: 68 mg/dL (ref >40)
LDL CHOLESTEROL (CALC): 106 mg/dL — AB
NON-HDL CHOLESTEROL (CALC): 125 mg/dL (ref <130)
Triglycerides: 93 mg/dL (ref <150)

## 2017-06-09 LAB — COMPLETE METABOLIC PANEL WITH GFR
AG Ratio: 1.8 (calc) (ref 1.0–2.5)
ALBUMIN MSPROF: 4.4 g/dL (ref 3.6–5.1)
ALT: 16 U/L (ref 9–46)
AST: 23 U/L (ref 10–35)
Alkaline phosphatase (APISO): 35 U/L — ABNORMAL LOW (ref 40–115)
BUN / CREAT RATIO: 14 (calc) (ref 6–22)
BUN: 20 mg/dL (ref 7–25)
CALCIUM: 10.2 mg/dL (ref 8.6–10.3)
CO2: 29 mmol/L (ref 20–32)
CREATININE: 1.42 mg/dL — AB (ref 0.70–1.18)
Chloride: 108 mmol/L (ref 98–110)
GFR, EST AFRICAN AMERICAN: 57 mL/min/{1.73_m2} — AB (ref >=60)
GFR, EST NON AFRICAN AMERICAN: 49 mL/min/{1.73_m2} — AB (ref >=60)
GLUCOSE: 104 mg/dL — AB (ref 65–99)
Globulin: 2.4 g/dL (calc) (ref 1.9–3.7)
Potassium: 4.7 mmol/L (ref 3.5–5.3)
SODIUM: 144 mmol/L (ref 135–146)
TOTAL PROTEIN: 6.8 g/dL (ref 6.1–8.1)
Total Bilirubin: 0.8 mg/dL (ref 0.2–1.2)

## 2017-06-09 LAB — CBC WITH DIFFERENTIAL/PLATELET
BASOS ABS: 20 {cells}/uL (ref 0–200)
BASOS PCT: 0.4 %
EOS PCT: 1 %
Eosinophils Absolute: 50 cells/uL (ref 15–500)
HEMATOCRIT: 49 % (ref 38.5–50.0)
HEMOGLOBIN: 17.5 g/dL — AB (ref 13.2–17.1)
LYMPHS ABS: 1210 {cells}/uL (ref 850–3900)
MCH: 34.4 pg — ABNORMAL HIGH (ref 27.0–33.0)
MCHC: 35.7 g/dL (ref 32.0–36.0)
MCV: 96.5 fL (ref 80.0–100.0)
MONOS PCT: 10.8 %
MPV: 9.8 fL (ref 7.5–12.5)
NEUTROS ABS: 3180 {cells}/uL (ref 1500–7800)
Neutrophils Relative %: 63.6 %
Platelets: 193 10*3/uL (ref 140–400)
RBC: 5.08 10*6/uL (ref 4.20–5.80)
RDW: 12.4 % (ref 11.0–15.0)
Total Lymphocyte: 24.2 %
WBC mixed population: 540 cells/uL (ref 200–950)
WBC: 5 10*3/uL (ref 3.8–10.8)

## 2017-06-09 LAB — TSH: TSH: 2.57 mIU/L (ref 0.40–4.50)

## 2017-06-09 NOTE — Patient Instructions (Addendum)
Take cholesterol medication in evening  Separate vitamin D, magnesium, etc from nexium by at least 4 hours to improve absorption    Preventing High Cholesterol Cholesterol is a waxy, fat-like substance that your body needs in small amounts. Your liver makes all the cholesterol that your body needs. Having high cholesterol (hypercholesterolemia) increases your risk for heart disease and stroke. Extra (excess) cholesterol comes from the food you eat, such as animal-based fat (saturated fat) from meat and some dairy products. High cholesterol can often be prevented with diet and lifestyle changes. If you already have high cholesterol, you can control it with diet and lifestyle changes, as well as medicine. What nutrition changes can be made?  Eat less saturated fat. Foods that contain saturated fat include red meat and some dairy products.  Avoid processed meats, like bacon and lunch meats.  Avoid trans fats, which are found in margarine and some baked goods.  Avoid foods and beverages that have added sugars.  Eat more fruits, vegetables, and whole grains.  Choose healthy sources of protein, such as fish, poultry, and nuts.  Choose healthy sources of fat, such as: ? Nuts. ? Vegetable oils, especially olive oil. ? Fish that have healthy fats (omega-3 fatty acids), such as mackerel or salmon. What lifestyle changes can be made?  Lose weight if you are overweight. Losing 5-10 lb (2.3-4.5 kg) can help prevent or control high cholesterol and reduce your risk for diabetes and high blood pressure. Ask your health care provider to help you with a diet and exercise plan to safely lose weight.  Get enough exercise. Do at least 150 minutes of moderate-intensity exercise each week. ? You could do this in short exercise sessions several times a day, or you could do longer exercise sessions a few times a week. For example, you could take a brisk 10-minute walk or bike ride, 3 times a day, for 5 days a  week.  Do not smoke. If you need help quitting, ask your health care provider.  Limit your alcohol intake. If you drink alcohol, limit alcohol intake to no more than 1 drink a day for nonpregnant women and 2 drinks a day for men. One drink equals 12 oz of beer, 5 oz of wine, or 1 oz of hard liquor. Why are these changes important? If you have high cholesterol, deposits (plaques) may build up on the walls of your blood vessels. Plaques make the arteries narrower and stiffer, which can restrict or block blood flow and cause blood clots to form. This greatly increases your risk for heart attack and stroke. Making diet and lifestyle changes can reduce your risk for these life-threatening conditions. What can I do to lower my risk?  Manage your risk factors for high cholesterol. Talk with your health care provider about all of your risk factors and how to lower your risk.  Manage other conditions that you have, such as diabetes or high blood pressure (hypertension).  Have your cholesterol checked at regular intervals.  Keep all follow-up visits as told by your health care provider. This is important. How is this treated? In addition to diet and lifestyle changes, your health care provider may recommend medicines to help lower cholesterol, such as a medicine to reduce the amount of cholesterol made in your liver. You may need medicine if:  Diet and lifestyle changes do not lower your cholesterol enough.  You have high cholesterol and other risk factors for heart disease or stroke.  Take over-the-counter and prescription  medicines only as told by your health care provider. Where to find more information:  American Heart Association: ThisTune.com.pt.jsp  National Heart, Lung, and Blood Institute: FrenchToiletries.com.cy Summary  High cholesterol increases your risk for heart  disease and stroke. By keeping your cholesterol level low, you can reduce your risk for these conditions.  Diet and lifestyle changes are the most important steps in preventing high cholesterol.  Work with your health care provider to manage your risk factors, and have your blood tested regularly. This information is not intended to replace advice given to you by your health care provider. Make sure you discuss any questions you have with your health care provider. Document Released: 02/08/2015 Document Revised: 10/03/2015 Document Reviewed: 10/03/2015 Elsevier Interactive Patient Education  [employer].

## 2017-06-26 DIAGNOSIS — R7303 Prediabetes: Secondary | ICD-10-CM | POA: Diagnosis not present

## 2017-06-26 DIAGNOSIS — N183 Chronic kidney disease, stage 3 (moderate): Secondary | ICD-10-CM | POA: Diagnosis not present

## 2017-06-26 DIAGNOSIS — I129 Hypertensive chronic kidney disease with stage 1 through stage 4 chronic kidney disease, or unspecified chronic kidney disease: Secondary | ICD-10-CM | POA: Diagnosis not present

## 2017-06-26 DIAGNOSIS — E785 Hyperlipidemia, unspecified: Secondary | ICD-10-CM | POA: Diagnosis not present

## 2017-06-26 DIAGNOSIS — Z720 Tobacco use: Secondary | ICD-10-CM | POA: Diagnosis not present

## 2017-06-26 DIAGNOSIS — C61 Malignant neoplasm of prostate: Secondary | ICD-10-CM | POA: Diagnosis not present

## 2017-06-26 DIAGNOSIS — E559 Vitamin D deficiency, unspecified: Secondary | ICD-10-CM | POA: Diagnosis not present

## 2017-07-10 DIAGNOSIS — I129 Hypertensive chronic kidney disease with stage 1 through stage 4 chronic kidney disease, or unspecified chronic kidney disease: Secondary | ICD-10-CM | POA: Diagnosis not present

## 2017-08-07 ENCOUNTER — Other Ambulatory Visit: Payer: Self-pay | Admitting: Internal Medicine

## 2017-08-07 DIAGNOSIS — E782 Mixed hyperlipidemia: Secondary | ICD-10-CM

## 2017-08-07 MED FILL — ROSUVASTATIN CALCIUM 40 MG: 40 | 90 days supply | Qty: 90 | Fill #0

## 2017-08-07 MED FILL — AMLODIPINE BESYLATE 5 MG TA: 5 | 90 days supply | Qty: 90 | Fill #2

## 2017-08-07 MED FILL — FENOFIBRATE 134 MG CAPSULE: 134 | 90 days supply | Qty: 90 | Fill #0

## 2017-09-04 ENCOUNTER — Ambulatory Visit: Payer: Self-pay | Admitting: Internal Medicine

## 2017-09-19 ENCOUNTER — Ambulatory Visit: Payer: Self-pay | Admitting: Internal Medicine

## 2017-09-20 DIAGNOSIS — I129 Hypertensive chronic kidney disease with stage 1 through stage 4 chronic kidney disease, or unspecified chronic kidney disease: Secondary | ICD-10-CM | POA: Diagnosis not present

## 2017-09-20 DIAGNOSIS — R7303 Prediabetes: Secondary | ICD-10-CM | POA: Diagnosis not present

## 2017-09-20 DIAGNOSIS — D631 Anemia in chronic kidney disease: Secondary | ICD-10-CM | POA: Diagnosis not present

## 2017-09-20 DIAGNOSIS — N183 Chronic kidney disease, stage 3 (moderate): Secondary | ICD-10-CM | POA: Diagnosis not present

## 2017-09-20 DIAGNOSIS — N2581 Secondary hyperparathyroidism of renal origin: Secondary | ICD-10-CM | POA: Diagnosis not present

## 2017-09-20 DIAGNOSIS — Z720 Tobacco use: Secondary | ICD-10-CM | POA: Diagnosis not present

## 2017-09-20 DIAGNOSIS — C61 Malignant neoplasm of prostate: Secondary | ICD-10-CM | POA: Diagnosis not present

## 2017-09-20 DIAGNOSIS — E785 Hyperlipidemia, unspecified: Secondary | ICD-10-CM | POA: Diagnosis not present

## 2017-09-20 DIAGNOSIS — N179 Acute kidney failure, unspecified: Secondary | ICD-10-CM | POA: Diagnosis not present

## 2017-09-25 DIAGNOSIS — Z961 Presence of intraocular lens: Secondary | ICD-10-CM | POA: Diagnosis not present

## 2017-09-25 DIAGNOSIS — H01021 Squamous blepharitis right upper eyelid: Secondary | ICD-10-CM | POA: Diagnosis not present

## 2017-09-25 DIAGNOSIS — H10413 Chronic giant papillary conjunctivitis, bilateral: Secondary | ICD-10-CM | POA: Diagnosis not present

## 2017-09-25 DIAGNOSIS — H01024 Squamous blepharitis left upper eyelid: Secondary | ICD-10-CM | POA: Diagnosis not present

## 2017-09-25 DIAGNOSIS — H35373 Puckering of macula, bilateral: Secondary | ICD-10-CM | POA: Diagnosis not present

## 2017-09-25 DIAGNOSIS — H01025 Squamous blepharitis left lower eyelid: Secondary | ICD-10-CM | POA: Diagnosis not present

## 2017-09-25 DIAGNOSIS — H01022 Squamous blepharitis right lower eyelid: Secondary | ICD-10-CM | POA: Diagnosis not present

## 2017-09-25 DIAGNOSIS — H04123 Dry eye syndrome of bilateral lacrimal glands: Secondary | ICD-10-CM | POA: Diagnosis not present

## 2017-09-25 DIAGNOSIS — H26493 Other secondary cataract, bilateral: Secondary | ICD-10-CM | POA: Diagnosis not present

## 2017-10-19 DIAGNOSIS — D225 Melanocytic nevi of trunk: Secondary | ICD-10-CM | POA: Diagnosis not present

## 2017-10-19 DIAGNOSIS — L570 Actinic keratosis: Secondary | ICD-10-CM | POA: Diagnosis not present

## 2017-10-19 DIAGNOSIS — L821 Other seborrheic keratosis: Secondary | ICD-10-CM | POA: Diagnosis not present

## 2017-10-19 DIAGNOSIS — D1801 Hemangioma of skin and subcutaneous tissue: Secondary | ICD-10-CM | POA: Diagnosis not present

## 2017-10-19 DIAGNOSIS — C44722 Squamous cell carcinoma of skin of right lower limb, including hip: Secondary | ICD-10-CM | POA: Diagnosis not present

## 2017-10-19 DIAGNOSIS — L814 Other melanin hyperpigmentation: Secondary | ICD-10-CM | POA: Diagnosis not present

## 2017-10-19 DIAGNOSIS — Z85828 Personal history of other malignant neoplasm of skin: Secondary | ICD-10-CM | POA: Diagnosis not present

## 2017-10-19 DIAGNOSIS — L565 Disseminated superficial actinic porokeratosis (DSAP): Secondary | ICD-10-CM | POA: Diagnosis not present

## 2017-11-13 NOTE — Progress Notes (Signed)
MEDICARE ANNUAL WELLNESS VISIT AND FOLLOW UP Assessment:   [name] was seen today for medicare wellness and follow-up.  Diagnoses and all orders for this visit:  Essential hypertension Continue medication Monitor blood pressure at home; call if consistently over 130/80 Continue DASH diet.   Reminder to go to the ER if any CP, SOB, nausea, dizziness, severe HA, changes vision/speech, left arm numbness and tingling and jaw pain. -     CMP WITH GFR  Mixed hyperlipidemia -     Continue medication: rosuvastatin 40 mg, fenofibrate 134 mg  -     CMP/GFR -     TSH -     Lipid panel  Other abnormal glucose       -     Recent A1Cs at goal Discussed diet/exercise, weight management  Defer A1C; check CMP  Vitamin D deficiency/ osteoporosis prophylaxis -     At goal at recent check; continue to recommend supplementation for goal of 70-100 Defer vitamin D level -     Continue supplementation  Gastroesophageal reflux disease, esophagitis presence not specified -     Continue medications: ranitidine 300 mg -     CBC with Differential/Platelet -     BASIC METABOLIC PANEL WITH GFR  Prostate cancer (HCC)       -     PSA review stable, last undetectable       -     No urinary symptoms, continue monitoring  Continuous tobacco abuse     -       Low-dose screening CT     -       Counseled on reducing/quitting- patient not ready at this time  Arthritis -     diclofenac sodium (VOLTAREN) 1 % GEL; Apply 2 g topically 4 (four) times daily as needed (joint pain).   Over 30 minutes of exam, counseling, chart review, and critical decision making was performed  Future Appointments  Date Time Provider [place]  03/08/2018  9:00 AM Unk Pinto, MD [facility] None     Plan:   During the course of the visit the patient was educated and counseled about appropriate screening and preventive services including:    Pneumococcal vaccine   Influenza vaccine  Prevnar 13  Td  vaccine  Screening electrocardiogram  Colorectal cancer screening  Diabetes screening  Glaucoma screening  Nutrition counseling    Subjective:  Juan Torres is a 72 y.o. male who presents for Medicare Annual Wellness Visit and 3 month follow up for HTN, hyperlipidemia, prediabetes, and vitamin D Def.  The patient has been very active over the past year- taking walks ~1 hr daily pushing his grandson in his stroller at a brisk pace. Patient is pleased with his weight progress, recounts excellent dietary choices he is making; presents with normalized BMI- previously noted in overweight range.   he currently continues to smoke 1 pack a day; discussed risks associated with smoking, patient is not ready to quit.   He has a history of smoking, between the age of 46-77, has quit within the last 41 years or is current smoker. He has greater than a 30+ pack/smoking history and has no symptoms at this time. He qualify for low dose CT scan for lung cancer screening. A lung cancer screening counseling and shared decision was made during this visit, patient agrees with low dose CT scan.   BMI is Body mass index is 23.33 kg/m., he has been working on diet and exercise. Wt Readings  from Last 3 Encounters:  11/14/17 172 lb (78 kg)  06/09/17 174 lb 6.4 oz (79.1 kg)  02/20/17 172 lb 9.6 oz (78.3 kg)   His blood pressure has been controlled at home, today their BP is BP: 122/76  He does workout. He denies chest pain, shortness of breath, dizziness.   He is on cholesterol medication (rosuvastatin 40 mg daily, fenofibrate) and denies myalgias. His cholesterol is not at goal. The cholesterol last visit was:   Lab Results  Component Value Date   CHOL 193 06/09/2017   HDL 68 06/09/2017   LDLCALC 106 (H) 06/09/2017   TRIG 93 06/09/2017   CHOLHDL 2.8 06/09/2017    He has been working on diet and exercise for glucose management, and denies hyperglycemia, hypoglycemia , nausea, paresthesia of the feet,  polydipsia and polyuria. Last A1C in the office was:  Lab Results  Component Value Date   HGBA1C 5.1 02/20/2017   Last GFR was stable with historical values:  Lab Results  Component Value Date   GFRNONAA 49 (L) 06/09/2017   Patient is on Vitamin D supplement and near goal:  Lab Results  Component Value Date   VD25OH 61 02/20/2017      Medication Review: Current Outpatient Medications on File Prior to Visit  Medication Sig Dispense Refill  . amLODipine (NORVASC) 5 MG tablet Take 1 tablet (5 mg total) by mouth daily. 90 tablet 3  . aspirin 81 MG tablet Take 81 mg by mouth daily.    . B Complex Vitamins (VITAMIN B-COMPLEX PO) Take 1 tablet by mouth daily.    . Cholecalciferol (VITAMIN D) 2000 UNITS CAPS Take 2 capsules by mouth daily.    . cyclobenzaprine (FLEXERIL) 10 MG tablet Take 1 tablet (10 mg total) by mouth 3 (three) times daily as needed for muscle spasms. 60 tablet 0  . diclofenac sodium (VOLTAREN) 1 % GEL Apply 2 g topically 4 (four) times daily as needed (joint pain). 100 g 0  . esomeprazole (NEXIUM) 20 MG capsule Take 1 capsule daily for Heartburn    . fenofibrate micronized (LOFIBRA) 134 MG capsule TAKE 1 CAPSULE BY MOUTH EVERY DAY 90 capsule 1  . Magnesium 400 MG CAPS Take 2 capsules by mouth daily.    . Omega-3 Fatty Acids (FISH OIL PO) Take by mouth.    Marland Kitchen OVER THE COUNTER MEDICATION Taking OTC Nexium daily    . rosuvastatin (CRESTOR) 40 MG tablet TAKE 1 TABLET BY MOUTH AT BEDTIME 90 tablet 1  . vitamin E 1000 UNIT capsule Take 1,000 Units by mouth daily.     No current facility-administered medications on file prior to visit.     Allergies: Allergies  Allergen Reactions  . Lipitor [Atorvastatin]   . Niacin And Related     Flushing  . Penicillins Rash    Current Problems (verified) has Prostate cancer ([place]); GERD (gastroesophageal reflux disease); Hyperlipidemia; Hypertension; History of elevated glucose; Vitamin D deficiency; Medication management; BMI  23.0-23.9, adult; Continuous tobacco abuse; and CKD (chronic kidney disease), stage III ([place]) on their problem list.  Screening Tests Immunization History  Administered Date(s) Administered  . DT 10/20/2011  . Pneumococcal Polysaccharide-23 04/28/2016  . Zoster 10/20/2011   Preventative care: Last colonoscopy: 12/02/2011 due 2023 Chest CT screen: 11/30/2016  Prior vaccinations: TD or Tdap: 2013  Influenza: DUE denies Pneumococcal 23: 04/28/2016 Prevnar13: DUE  Shingles/Zostavax: 10/20/2011   Names of Other Physician/Practitioners you currently use: 1. [facility] here for primary  care 2. Dr. Katy Fitch, eye doctor, last visit  2019 3. Dr. Mariea Clonts, dentist, last visit 2019, goes q85m  Patient Care Team: Unk Pinto, MD as PCP - General (Internal Medicine) Arloa Koh, MD as Consulting Physician (Radiation Oncology) Clent Jacks, MD as Consulting Physician (Ophthalmology) Edrick Oh, MD as Consulting Physician (Nephrology)  Surgical: He  has a past surgical history that includes Prostate biopsy (12/19/2011); Colonoscopy w/ polypectomy; Radioactive seed implant (N/A, 04/04/2012); and Cystoscopy (N/A, 04/04/2012). Family His family history includes Breast cancer in his mother; Cancer in his cousin, maternal grandfather, and maternal uncle. Social history  He reports that he has been smoking cigarettes. He has a 30.00 pack-year smoking history. He has never used smokeless tobacco. He reports that he drinks about 36.0 standard drinks of alcohol per week. He reports that he has current or past drug history. Drug: Marijuana. Frequency: 6.00 times per week.  MEDICARE WELLNESS OBJECTIVES: Physical activity: Current Exercise Habits: Home exercise routine, Type of exercise: walking, Time (Minutes): 45, Frequency (Times/Week): 7, Weekly Exercise (Minutes/Week): 315, Intensity: Mild, Exercise limited by: None identified Cardiac risk factors: Cardiac  Risk Factors include: advanced age (>40men, >69 women);male gender;hypertension;dyslipidemia;smoking/ tobacco exposure Depression/mood screen:   Depression screen [facility] 2/9 11/14/2017  Decreased Interest 0  Down, Depressed, Hopeless 0  PHQ - 2 Score 0    ADLs:  In your present state of health, do you have any difficulty performing the following activities: 11/14/2017 02/20/2017  Hearing? N Y  Comment does well with hearing aids bilat hearing aids  Vision? N N  Difficulty concentrating or making decisions? N N  Walking or climbing stairs? N N  Dressing or bathing? - N  Doing errands, shopping? N N  Some recent data might be hidden     Cognitive Testing  Alert? Yes  Normal Appearance?Yes  Oriented to person? Yes  Place? Yes   Time? Yes  Recall of three objects?  Yes  Can perform simple calculations? Yes  Displays appropriate judgment?Yes  Can read the correct time from a watch face?Yes  EOL planning: Does Patient Have a Medical Advance Directive?: No Would patient like information on creating a medical advance directive?: No - Patient declined   Review of Systems  Constitutional: Negative for malaise/fatigue and weight loss.  HENT: Negative for hearing loss and tinnitus.   Eyes: Negative for blurred vision and double vision.  Respiratory: Negative for cough, sputum production, shortness of breath and wheezing.   Cardiovascular: Negative for chest pain, palpitations, orthopnea, claudication, leg swelling and PND.  Gastrointestinal: Negative for abdominal pain, blood in stool, constipation, diarrhea, heartburn, melena, nausea and vomiting.  Genitourinary: Negative.   Musculoskeletal: Negative for falls, joint pain and myalgias.  Skin: Negative for rash.  Neurological: Negative for dizziness, tingling, sensory change, weakness and headaches.  Endo/Heme/Allergies: Negative for polydipsia.  Psychiatric/Behavioral: Negative.  Negative for depression, memory loss, substance abuse and  suicidal ideas. The patient is not nervous/anxious and does not have insomnia.   All other systems reviewed and are negative.    Objective:   Today's Vitals   11/14/17 0938  BP: 122/76  Pulse: 94  Temp: (!) 97.5 F (36.4 C)  SpO2: 98%  Weight: 172 lb (78 kg)  Height: 6' (1.829 m)   Body mass index is 23.33 kg/m.  General appearance: alert, red through face/neck , no distress, WD/WN, male HEENT: normocephalic, sclerae anicteric, TMs pearly, nares patent, no discharge or erythema, pharynx normal, normal hearing with bilateral hearing aids Oral cavity: MMM, no  lesions Neck: supple, no lymphadenopathy, no thyromegaly, no masses Heart: RRR, normal S1, S2, no murmurs Lungs: Symmetrical, somewhat coarse sounds throughout, not diminished, no wheezes, rhonchi, or rales Abdomen: +bs, soft, non tender, non distended, no masses, no hepatomegaly, no splenomegaly Musculoskeletal: nontender, no swelling, no obvious deformity Extremities: no edema, no cyanosis, no clubbing Pulses: 2+ symmetric, upper and lower extremities, normal cap refill Neurological: alert, oriented x 3, CN2-12 intact, strength normal upper extremities and lower extremities, sensation normal throughout, DTRs 2+ throughout, no cerebellar signs, gait normal Psychiatric: normal affect, behavior normal, pleasant   Medicare Attestation I have personally reviewed: The patient's medical and social history Their use of alcohol, tobacco or illicit drugs Their current medications and supplements The patient's functional ability including ADLs,fall risks, home safety risks, cognitive, and hearing and visual impairment Diet and physical activities Evidence for depression or mood disorders  The patient's weight, height, BMI, and visual acuity have been recorded in the chart.  I have made referrals, counseling, and provided education to the patient based on review of the above and I have provided the patient with a written  personalized care plan for preventive services.     Izora Ribas, NP   11/14/2017

## 2017-11-14 ENCOUNTER — Ambulatory Visit: Payer: Medicare HMO | Admitting: Adult Health

## 2017-11-14 ENCOUNTER — Encounter: Payer: Self-pay | Admitting: Adult Health

## 2017-11-14 VITALS — BP 122/76 | HR 94 | Temp 97.5°F | Ht 72.0 in | Wt 172.0 lb

## 2017-11-14 DIAGNOSIS — R6889 Other general symptoms and signs: Secondary | ICD-10-CM

## 2017-11-14 DIAGNOSIS — Z122 Encounter for screening for malignant neoplasm of respiratory organs: Secondary | ICD-10-CM

## 2017-11-14 DIAGNOSIS — K219 Gastro-esophageal reflux disease without esophagitis: Secondary | ICD-10-CM | POA: Diagnosis not present

## 2017-11-14 DIAGNOSIS — Z79899 Other long term (current) drug therapy: Secondary | ICD-10-CM | POA: Diagnosis not present

## 2017-11-14 DIAGNOSIS — Z6823 Body mass index (BMI) 23.0-23.9, adult: Secondary | ICD-10-CM

## 2017-11-14 DIAGNOSIS — E782 Mixed hyperlipidemia: Secondary | ICD-10-CM

## 2017-11-14 DIAGNOSIS — Z8639 Personal history of other endocrine, nutritional and metabolic disease: Secondary | ICD-10-CM

## 2017-11-14 DIAGNOSIS — Z23 Encounter for immunization: Secondary | ICD-10-CM

## 2017-11-14 DIAGNOSIS — N183 Chronic kidney disease, stage 3 unspecified: Secondary | ICD-10-CM

## 2017-11-14 DIAGNOSIS — Z0001 Encounter for general adult medical examination with abnormal findings: Secondary | ICD-10-CM | POA: Diagnosis not present

## 2017-11-14 DIAGNOSIS — E559 Vitamin D deficiency, unspecified: Secondary | ICD-10-CM

## 2017-11-14 DIAGNOSIS — C61 Malignant neoplasm of prostate: Secondary | ICD-10-CM

## 2017-11-14 DIAGNOSIS — I10 Essential (primary) hypertension: Secondary | ICD-10-CM

## 2017-11-14 DIAGNOSIS — Z720 Tobacco use: Secondary | ICD-10-CM

## 2017-11-14 DIAGNOSIS — Z0000 Encounter for general adult medical examination without abnormal findings: Secondary | ICD-10-CM

## 2017-11-14 LAB — COMPLETE METABOLIC PANEL WITH GFR
AG RATIO: 2.1 (calc) (ref 1.0–2.5)
ALKALINE PHOSPHATASE (APISO): 49 U/L (ref 40–115)
ALT: 20 U/L (ref 9–46)
AST: 27 U/L (ref 10–35)
Albumin: 4.7 g/dL (ref 3.6–5.1)
BUN / CREAT RATIO: 11 (calc) (ref 6–22)
BUN: 14 mg/dL (ref 7–25)
CALCIUM: 10.3 mg/dL (ref 8.6–10.3)
CO2: 25 mmol/L (ref 20–32)
Chloride: 105 mmol/L (ref 98–110)
Creat: 1.23 mg/dL — ABNORMAL HIGH (ref 0.70–1.18)
GFR, EST NON AFRICAN AMERICAN: 58 mL/min/{1.73_m2} — AB (ref >=60)
GFR, Est African American: 68 mL/min/{1.73_m2} (ref >=60)
GLOBULIN: 2.2 g/dL (ref 1.9–3.7)
Glucose, Bld: 95 mg/dL (ref 65–99)
POTASSIUM: 4 mmol/L (ref 3.5–5.3)
SODIUM: 140 mmol/L (ref 135–146)
Total Bilirubin: 0.7 mg/dL (ref 0.2–1.2)
Total Protein: 6.9 g/dL (ref 6.1–8.1)

## 2017-11-14 LAB — CBC WITH DIFFERENTIAL/PLATELET
BASOS ABS: 41 {cells}/uL (ref 0–200)
Basophils Relative: 0.9 %
EOS ABS: 41 {cells}/uL (ref 15–500)
Eosinophils Relative: 0.9 %
HEMATOCRIT: 52.1 % — AB (ref 38.5–50.0)
Hemoglobin: 18.7 g/dL — ABNORMAL HIGH (ref 13.2–17.1)
Lymphs Abs: 801 cells/uL — ABNORMAL LOW (ref 850–3900)
MCH: 34.9 pg — ABNORMAL HIGH (ref 27.0–33.0)
MCHC: 35.9 g/dL (ref 32.0–36.0)
MCV: 97.2 fL (ref 80.0–100.0)
MPV: 9.9 fL (ref 7.5–12.5)
Monocytes Relative: 13.4 %
NEUTROS PCT: 67 %
Neutro Abs: 3015 cells/uL (ref 1500–7800)
Platelets: 217 10*3/uL (ref 140–400)
RBC: 5.36 10*6/uL (ref 4.20–5.80)
RDW: 12.7 % (ref 11.0–15.0)
Total Lymphocyte: 17.8 %
WBC: 4.5 10*3/uL (ref 3.8–10.8)
WBCMIX: 603 {cells}/uL (ref 200–950)

## 2017-11-14 LAB — TSH: TSH: 2.23 m[IU]/L (ref 0.40–4.50)

## 2017-11-14 LAB — LIPID PANEL
Cholesterol: 214 mg/dL — ABNORMAL HIGH
HDL: 66 mg/dL
LDL Cholesterol (Calc): 126 mg/dL — ABNORMAL HIGH
Non-HDL Cholesterol (Calc): 148 mg/dL — ABNORMAL HIGH
Total CHOL/HDL Ratio: 3.2 (calc)
Triglycerides: 116 mg/dL

## 2017-11-14 LAB — MAGNESIUM: Magnesium: 1.8 mg/dL (ref 1.5–2.5)

## 2017-11-14 NOTE — Patient Instructions (Addendum)
  Mr. Bolds , Thank you for taking time to come for your Medicare Wellness Visit. I appreciate your ongoing commitment to your health goals. Please review the following plan we discussed and let me know if I can assist you in the future.   These are the goals we discussed: Goals    . Blood Pressure < 130/80       This is a list of the screening recommended for you and due dates:  Health Maintenance  Topic Date Due  . Flu Shot  11/21/2017*  .  Hepatitis C: One time screening is recommended by Center for Disease Control  (CDC) for  adults born from [birth year] through 1965.   11/15/2018*  . Tetanus Vaccine  10/19/2021  . Colon Cancer Screening  12/01/2021  . Pneumonia vaccines  Addressed  *Topic was postponed. The date shown is not the original due date.    Know what a healthy weight is for you (roughly BMI <25) and aim to maintain this  Aim for 7+ servings of fruits and vegetables daily  65-80+ fluid ounces of water or unsweet tea for healthy kidneys  Limit to max 1 drink of alcohol per day; avoid smoking/tobacco  Limit animal fats in diet for cholesterol and heart health - choose grass fed whenever available  Avoid highly processed foods, and foods high in saturated/trans fats  Aim for low stress - take time to unwind and care for your mental health  Aim for 150 min of moderate intensity exercise weekly for heart health, and weights twice weekly for bone health  Aim for 7-9 hours of sleep daily

## 2017-11-15 ENCOUNTER — Other Ambulatory Visit: Payer: Self-pay | Admitting: Adult Health

## 2017-11-20 ENCOUNTER — Other Ambulatory Visit: Payer: Self-pay | Admitting: *Deleted

## 2017-11-20 DIAGNOSIS — I10 Essential (primary) hypertension: Secondary | ICD-10-CM

## 2017-11-20 DIAGNOSIS — E782 Mixed hyperlipidemia: Secondary | ICD-10-CM

## 2017-11-20 MED ORDER — ROSUVASTATIN CALCIUM 40 MG PO TABS
40.0000 mg | ORAL_TABLET | Freq: Every day | ORAL | 1 refills | Status: DC
Start: 2017-11-20 — End: 2018-07-09

## 2017-11-20 MED ORDER — AMLODIPINE BESYLATE 5 MG PO TABS: 5.0000 mg | ORAL_TABLET | Freq: Every day | ORAL | 3 refills | Status: DC

## 2017-11-20 MED FILL — AMLODIPINE BESYLATE 5 MG TA: 5 | 90 days supply | Qty: 90 | Fill #0

## 2017-11-20 MED FILL — ROSUVASTATIN CALCIUM 40 MG: 40 | 90 days supply | Qty: 90 | Fill #0

## 2017-11-21 DIAGNOSIS — Z85828 Personal history of other malignant neoplasm of skin: Secondary | ICD-10-CM | POA: Diagnosis not present

## 2017-11-21 DIAGNOSIS — C44722 Squamous cell carcinoma of skin of right lower limb, including hip: Secondary | ICD-10-CM | POA: Diagnosis not present

## 2017-11-21 MED FILL — DOXYCYCLINE HYC 100 MG CAPS: 100 | 5 days supply | Qty: 10 | Fill #0

## 2017-12-06 ENCOUNTER — Ambulatory Visit
Admission: RE | Admit: 2017-12-06 | Discharge: 2017-12-06 | Disposition: A | Discharge status: Home / Self Care | Payer: Medicare HMO | Source: Ambulatory Visit | Attending: Adult Health | Admitting: Adult Health

## 2017-12-06 ENCOUNTER — Ambulatory Visit: Payer: Medicare HMO

## 2017-12-06 DIAGNOSIS — R69 Illness, unspecified: Secondary | ICD-10-CM | POA: Diagnosis not present

## 2017-12-06 DIAGNOSIS — Z122 Encounter for screening for malignant neoplasm of respiratory organs: Secondary | ICD-10-CM

## 2017-12-12 MED FILL — FENOFIBRATE 134 MG CAPSULE: 134 | 90 days supply | Qty: 90 | Fill #1

## 2018-03-07 ENCOUNTER — Encounter: Payer: Self-pay | Admitting: Internal Medicine

## 2018-03-07 NOTE — Patient Instructions (Signed)

## 2018-03-07 NOTE — Progress Notes (Signed)
[facility]   Unk Pinto, M.D.     Uvaldo Bristle. Silverio Lay, P.A.-C Liane Comber, [place]                [address] [place] [address] Telephone [phone] Telefax [phone] Annual  Screening/Preventative Visit  & Comprehensive Evaluation & Examination     This very nice 73 y.o. MWM presents for a Screening /Preventative Visit & comprehensive evaluation and management of multiple medical co-morbidities.  Patient has been followed for HTN, HLD, Prediabetes and Vitamin D Deficiency. Other problems include GERD controlled on Nexium 20 mg/OTC. 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 Today he's also c/o discomfort of his bilat 1st toenails.      HTN predates circa 1998. Patient's BP has been controlled at home.  Today's BP is at goal - 138/82. Patient is followed by Justin Mend also for his HT associated CKD3. Patient denies any cardiac symptoms as chest pain, palpitations, shortness of breath, dizziness or ankle swelling.     Patient's hyperlipidemia is not controlled with diet and sporadic med compliance. Patient denies myalgias or other medication SE's. Last lipids were not at goal: Lab Results  Component Value Date   CHOL 214 (H) 11/14/2017   HDL 66 11/14/2017   LDLCALC 126 (H) 11/14/2017   TRIG 116 11/14/2017   CHOLHDL 3.2 11/14/2017      Patient has hx/o prediabetes  (A1c 5.7% / 2014) and patient denies reactive hypoglycemic symptoms, visual blurring, diabetic polys or paresthesias. Last A1c was Normal & at goal: Lab Results  Component Value Date   HGBA1C 5.1 02/20/2017       Finally, patient has history of Vitamin D Deficiency ("13" / 2013)  and last vitamin D was at goal: Lab Results  Component Value Date   VD25OH 61 02/20/2017   Current Outpatient Medications on File Prior to Visit  Medication Sig  . amLODipine (NORVASC) 5 MG tablet Take 1 tablet (5 mg  total) by mouth daily.  Marland Kitchen aspirin 81 MG tablet Take 81 mg by mouth daily.  . B Complex Vitamins (VITAMIN B-COMPLEX PO) Take 1 tablet by mouth daily.  . Cholecalciferol (VITAMIN D) 2000 UNITS CAPS Take 3 capsules by mouth daily.   . cyclobenzaprine (FLEXERIL) 10 MG tablet Take 1 tablet (10 mg total) by mouth 3 (three) times daily as needed for muscle spasms.  . diclofenac sodium (VOLTAREN) 1 % GEL Apply 2 g topically 4 (four) times daily as needed (joint pain).  Marland Kitchen esomeprazole (NEXIUM) 20 MG capsule Take 1 capsule daily for Heartburn  . fenofibrate micronized (LOFIBRA) 134 MG capsule TAKE 1 CAPSULE BY MOUTH EVERY DAY  . Magnesium 400 MG CAPS Take 2 capsules by mouth daily.  . Omega-3 Fatty Acids (FISH OIL PO) Take by mouth.  Marland Kitchen OVER THE COUNTER MEDICATION Taking OTC Nexium daily  . rosuvastatin (CRESTOR) 40 MG tablet Take 1 tablet (40 mg total) by mouth at bedtime.  . vitamin E 1000 UNIT capsule Take 1,000 Units by mouth daily.   No current facility-administered medications on file prior to visit.    Allergies  Allergen Reactions  . Lipitor [Atorvastatin]   . Niacin And Related     Flushing  . Penicillins Rash   Past Medical History:  Diagnosis Date  . Adenomatous colon polyp   . GERD (gastroesophageal reflux disease)   .  History of basal cell cancer 2012  . Hyperlipidemia   . Hypertension   . Prediabetes   . Prostate cancer ([place]) DX 12/19/11   bx=Adenocarcinoma,gleason=3+3=6,volume=42cc,PSA=4.63  . Vitamin D deficiency    Health Maintenance  Topic Date Due  . INFLUENZA VACCINE  09/07/2017  . Hepatitis C Screening  11/15/2018 (Originally 11/21/1945)  . TETANUS/TDAP  10/19/2021  . COLONOSCOPY  12/01/2021  . PNA vac Low Risk Adult  Addressed   Immunization History  Administered Date(s) Administered  . DT 10/20/2011  . Pneumococcal Conjugate-13 11/14/2017  . Pneumococcal Polysaccharide-23 04/28/2016  . Zoster 10/20/2011   Last Colon - 12/02/2011 - Dr Deatra Ina - recc 10 yr  f/u - due Oct 2023  Past Surgical History:  Procedure Laterality Date  . COLONOSCOPY W/ POLYPECTOMY     Adenomatous  . CYSTOSCOPY N/A 04/04/2012   Procedure: CYSTOSCOPY;  Surgeon: Molli Hazard, MD;  Location[facility];  Service: Urology;  Laterality: N/A;  . PROSTATE BIOPSY  12/19/2011   Procedure: BIOPSY TRANSRECTAL ULTRASONIC PROSTATE (TUBP);  Surgeon: Molli Hazard, MD;  Location[facility];  Service: Urology;  Laterality: N/A;    . RADIOACTIVE SEED IMPLANT N/A 04/04/2012   Procedure: RADIOACTIVE SEED IMPLANT;  Surgeon: Molli Hazard, MD;  Location[facility];  Service: Urology;  Laterality: N/A;  Seeds Implanted     72  Seeds Found in Bladder     None    Family History  Problem Relation Age of Onset  . Breast cancer Mother   . Cancer Maternal Grandfather        colon  . Cancer Maternal Uncle        prostate  . Cancer Cousin        prostyae cancer 1st maternal    Social History   Socioeconomic History  . Marital status: Married    Spouse name: [name]  . Number of children: 2  Occupational History  . Retired EMT   Tobacco Use  . Smoking status: Current Every Day Smoker    Packs/day: 1.00    Years: 30.00    Pack years: 30.00    Types: Cigarettes  . Smokeless tobacco: Never Used  . Tobacco comment: smokes cigarettes and marijuana nearly daily  Substance and Sexual Activity  . Alcohol use: Yes    Alcohol/week: 36.0 standard drinks    Types: 36 Cans of beer per week    Comment: 4-6 beers daily, tequila  . Drug use: Yes    Frequency: 6.0 times per week    Types: Marijuana    Comment: / marijuana  . Sexual activity: Not on file    ROS Constitutional: Denies fever, chills, weight loss/gain, headaches, insomnia,  night sweats or change in appetite. Does c/o fatigue. Eyes: Denies redness, blurred vision, diplopia, discharge, itchy or watery eyes.  ENT: Denies discharge, congestion, post nasal  drip, epistaxis, sore throat, earache, hearing loss, dental pain, Tinnitus, Vertigo, Sinus pain or snoring.  Cardio: Denies chest pain, palpitations, irregular heartbeat, syncope, dyspnea, diaphoresis, orthopnea, PND, claudication or edema Respiratory: denies cough, dyspnea, DOE, pleurisy, hoarseness, laryngitis or wheezing.  Gastrointestinal: Denies dysphagia, heartburn, reflux, water brash, pain, cramps, nausea, vomiting, bloating, diarrhea, constipation, hematemesis, melena, hematochezia, jaundice or hemorrhoids Genitourinary: Denies dysuria, frequency, urgency, nocturia, hesitancy, discharge, hematuria or flank pain Musculoskeletal: Denies arthralgia, myalgia, stiffness, Jt. Swelling, pain, limp or strain/sprain. Denies Falls. Skin: Denies puritis, rash, hives, warts, acne, eczema or change in skin lesion Neuro: No weakness, tremor,  incoordination, spasms, paresthesia or pain Psychiatric: Denies confusion, memory loss or sensory loss. Denies Depression. Endocrine: Denies change in weight, skin, hair change, nocturia, and paresthesia, diabetic polys, visual blurring or hyper / hypo glycemic episodes.  Heme/Lymph: No excessive bleeding, bruising or enlarged lymph nodes.  Physical Exam  BP 138/82   Pulse 68   Temp 97.7 F (36.5 C)   Ht 6' (1.829 m)   Wt 175 lb 6.4 oz (79.6 kg)   SpO2 99%   BMI 23.79 kg/m   General Appearance: Ruddy facies. Well nourished andand in no apparent distress.  Eyes: PERRLA, EOMs, conjunctiva no swelling or erythema, normal fundi and vessels. Sinuses: No frontal/maxillary tenderness ENT/Mouth: EACs patent / TMs  nl. Nares clear without erythema, swelling, mucoid exudates. Oral hygiene is good. No erythema, swelling, or exudate. Tongue normal, non-obstructing. Tonsils not swollen or erythematous. Hearing normal.  Neck: Supple, thyroid not palpable. No bruits, nodes or JVD. Respiratory: Respiratory effort normal.  BS equal and clear bilateral without rales,  rhonci, wheezing or stridor. Cardio: Heart sounds are normal with regular rate and rhythm and no murmurs, rubs or gallops. Peripheral pulses are normal and equal bilaterally without edema. No aortic or femoral bruits. Chest: symmetric with normal excursions and percussion.  Abdomen: Soft, with Nl bowel sounds. Nontender, no guarding, rebound, hernias, masses, or organomegaly.  Lymphatics: Non tender without lymphadenopathy.  Musculoskeletal: Full ROM all peripheral extremities, joint stability, 5/5 strength, and normal gait. Skin: Warm and dry without rashes, lesions, cyanosis, clubbing or  ecchymosis.  Bilat 1st toenails yellowish discolored, moderately thickened, dystrophic and chalky. Neuro: Cranial nerves intact, reflexes equal bilaterally. Normal muscle tone, no cerebellar symptoms. Sensation intact.  Pysch: Alert and oriented X 3 with normal affect, insight and judgment appropriate.   Assessment and Plan  1. Annual Preventative/Screening Exam   2. Essential hypertension  - EKG 12-Lead - [place], RETROPERITNL ABD,  LTD - Urinalysis, Routine w reflex microscopic - Microalbumin / creatinine urine ratio - CBC with Differential/Platelet - COMPLETE METABOLIC PANEL WITH GFR - Magnesium - TSH  3. Hyperlipidemia, mixed  - EKG 12-Lead - [place], RETROPERITNL ABD,  LTD - Lipid panel - TSH  4. Abnormal glucose  - EKG 12-Lead - [place], RETROPERITNL ABD,  LTD - Hemoglobin A1c - Insulin, random  5. Vitamin D deficiency  - VITAMIN D 25 Hydroxyl  6. Prediabetes  - EKG 12-Lead - [place], RETROPERITNL ABD,  LTD - Hemoglobin A1c - Insulin, random  7. Gastroesophageal reflux disease  - CBC with Differential/Platelet  8. CKD (chronic kidney disease) stage 3, GFR 30-59 ml/min (HCC)  - Urinalysis, Routine w reflex microscopic - Microalbumin / creatinine urine ratio  9. History of prostate cancer  - PSA  10. BPH with obstruction/lower urinary tract symptoms  - PSA  11. Prostate cancer  screening  - PSA  12. Screening for colorectal cancer  - POC Hemoccult Bld/Stl   13. Screening for ischemic heart disease  - EKG 12-Lead  14. Smoker  - EKG 12-Lead - [place], RETROPERITNL ABD,  LTD  15. Screening for AAA (aortic abdominal aneurysm)  - [place], RETROPERITNL ABD,  LTD  16. Onychomycosis of Bilat 1st toenails   - Rx Lamasil 250 mg #90 - take 1 tab daily   17. Medication management  - Urinalysis, Routine w reflex microscopic - Microalbumin / creatinine urine ratio - CBC with Differential/Platelet - COMPLETE METABOLIC PANEL WITH GFR - Magnesium - Lipid panel - TSH - Hemoglobin A1c - Insulin, random - VITAMIN  D 25 Hydroxyl        Patient was counseled in prudent diet, weight control to achieve/maintain BMI less than 25, BP monitoring, regular exercise and medications as discussed.  Discussed med effects and SE's. Routine screening labs and tests as requested with regular follow-up as recommended. Over 40 minutes of exam, counseling, chart review and high complex critical decision making was performed

## 2018-03-08 ENCOUNTER — Ambulatory Visit: Payer: Medicare HMO | Admitting: Internal Medicine

## 2018-03-08 ENCOUNTER — Encounter: Payer: Self-pay | Admitting: Internal Medicine

## 2018-03-08 VITALS — BP 138/82 | HR 68 | Temp 97.7°F | Ht 72.0 in | Wt 175.4 lb

## 2018-03-08 DIAGNOSIS — Z0001 Encounter for general adult medical examination with abnormal findings: Secondary | ICD-10-CM

## 2018-03-08 DIAGNOSIS — Z79899 Other long term (current) drug therapy: Secondary | ICD-10-CM

## 2018-03-08 DIAGNOSIS — Z0000 Encounter for general adult medical examination without abnormal findings: Secondary | ICD-10-CM | POA: Diagnosis not present

## 2018-03-08 DIAGNOSIS — N138 Other obstructive and reflux uropathy: Secondary | ICD-10-CM

## 2018-03-08 DIAGNOSIS — F17200 Nicotine dependence, unspecified, uncomplicated: Secondary | ICD-10-CM

## 2018-03-08 DIAGNOSIS — Z1212 Encounter for screening for malignant neoplasm of rectum: Secondary | ICD-10-CM

## 2018-03-08 DIAGNOSIS — E782 Mixed hyperlipidemia: Secondary | ICD-10-CM

## 2018-03-08 DIAGNOSIS — B351 Tinea unguium: Secondary | ICD-10-CM

## 2018-03-08 DIAGNOSIS — Z1211 Encounter for screening for malignant neoplasm of colon: Secondary | ICD-10-CM

## 2018-03-08 DIAGNOSIS — I10 Essential (primary) hypertension: Secondary | ICD-10-CM

## 2018-03-08 DIAGNOSIS — R7309 Other abnormal glucose: Secondary | ICD-10-CM

## 2018-03-08 DIAGNOSIS — R7303 Prediabetes: Secondary | ICD-10-CM | POA: Diagnosis not present

## 2018-03-08 DIAGNOSIS — K219 Gastro-esophageal reflux disease without esophagitis: Secondary | ICD-10-CM | POA: Diagnosis not present

## 2018-03-08 DIAGNOSIS — E559 Vitamin D deficiency, unspecified: Secondary | ICD-10-CM | POA: Diagnosis not present

## 2018-03-08 DIAGNOSIS — N183 Chronic kidney disease, stage 3 unspecified: Secondary | ICD-10-CM

## 2018-03-08 DIAGNOSIS — N401 Enlarged prostate with lower urinary tract symptoms: Secondary | ICD-10-CM | POA: Diagnosis not present

## 2018-03-08 DIAGNOSIS — Z125 Encounter for screening for malignant neoplasm of prostate: Secondary | ICD-10-CM

## 2018-03-08 DIAGNOSIS — Z136 Encounter for screening for cardiovascular disorders: Secondary | ICD-10-CM | POA: Diagnosis not present

## 2018-03-08 DIAGNOSIS — Z8546 Personal history of malignant neoplasm of prostate: Secondary | ICD-10-CM

## 2018-03-08 MED ORDER — TERBINAFINE HCL 250 MG PO TABS: 250.0000 mg | ORAL_TABLET | Freq: Every day | ORAL | 0 refills | Status: DC

## 2018-03-08 MED FILL — TERBINAFINE HCL 250 MG TAB: 250 | 90 days supply | Qty: 90 | Fill #0

## 2018-03-09 LAB — CBC WITH DIFFERENTIAL/PLATELET
ABSOLUTE MONOCYTES: 562 {cells}/uL (ref 200–950)
BASOS ABS: 32 {cells}/uL (ref 0–200)
Basophils Relative: 0.6 %
EOS ABS: 32 {cells}/uL (ref 15–500)
Eosinophils Relative: 0.6 %
HCT: 52.4 % — ABNORMAL HIGH (ref 38.5–50.0)
Hemoglobin: 19 g/dL — ABNORMAL HIGH (ref 13.2–17.1)
Lymphs Abs: 975 cells/uL (ref 850–3900)
MCH: 35.9 pg — ABNORMAL HIGH (ref 27.0–33.0)
MCHC: 36.3 g/dL — AB (ref 32.0–36.0)
MCV: 99.1 fL (ref 80.0–100.0)
MPV: 9.8 fL (ref 7.5–12.5)
Monocytes Relative: 10.6 %
Neutro Abs: 3699 cells/uL (ref 1500–7800)
Neutrophils Relative %: 69.8 %
Platelets: 203 10*3/uL (ref 140–400)
RBC: 5.29 10*6/uL (ref 4.20–5.80)
RDW: 12.9 % (ref 11.0–15.0)
Total Lymphocyte: 18.4 %
WBC: 5.3 10*3/uL (ref 3.8–10.8)

## 2018-03-09 LAB — URINALYSIS, ROUTINE W REFLEX MICROSCOPIC
Bilirubin Urine: NEGATIVE
GLUCOSE, UA: NEGATIVE
Hgb urine dipstick: NEGATIVE
Ketones, ur: NEGATIVE
Leukocytes, UA: NEGATIVE
Nitrite: NEGATIVE
Protein, ur: NEGATIVE
Specific Gravity, Urine: 1.015 (ref 1.001–1.03)
pH: 7 (ref 5.0–8.0)

## 2018-03-09 LAB — COMPLETE METABOLIC PANEL WITH GFR
AG Ratio: 2 (calc) (ref 1.0–2.5)
ALT: 17 U/L (ref 9–46)
AST: 26 U/L (ref 10–35)
Albumin: 4.6 g/dL (ref 3.6–5.1)
Alkaline phosphatase (APISO): 38 U/L — ABNORMAL LOW (ref 40–115)
BUN/Creatinine Ratio: 11 (calc) (ref 6–22)
BUN: 14 mg/dL (ref 7–25)
CALCIUM: 10.3 mg/dL (ref 8.6–10.3)
CO2: 24 mmol/L (ref 20–32)
Chloride: 106 mmol/L (ref 98–110)
Creat: 1.28 mg/dL — ABNORMAL HIGH (ref 0.70–1.18)
GFR, Est African American: 64 mL/min/{1.73_m2} (ref >=60)
GFR, Est Non African American: 56 mL/min/{1.73_m2} — ABNORMAL LOW (ref >=60)
Globulin: 2.3 g/dL (calc) (ref 1.9–3.7)
Glucose, Bld: 96 mg/dL (ref 65–99)
Potassium: 4.4 mmol/L (ref 3.5–5.3)
Sodium: 141 mmol/L (ref 135–146)
Total Bilirubin: 1 mg/dL (ref 0.2–1.2)
Total Protein: 6.9 g/dL (ref 6.1–8.1)

## 2018-03-09 LAB — MICROALBUMIN / CREATININE URINE RATIO
Creatinine, Urine: 92 mg/dL (ref 20–320)
Microalb Creat Ratio: 4 mcg/mg creat (ref <30)
Microalb, Ur: 0.4 mg/dL

## 2018-03-09 LAB — LIPID PANEL
Cholesterol: 218 mg/dL — ABNORMAL HIGH (ref <200)
HDL: 71 mg/dL (ref >40)
LDL CHOLESTEROL (CALC): 122 mg/dL — AB
Non-HDL Cholesterol (Calc): 147 mg/dL (calc) — ABNORMAL HIGH (ref <130)
Total CHOL/HDL Ratio: 3.1 (calc) (ref <5.0)
Triglycerides: 132 mg/dL (ref <150)

## 2018-03-09 LAB — INSULIN, RANDOM: Insulin: 1.2 u[IU]/mL — ABNORMAL LOW (ref 2.0–19.6)

## 2018-03-09 LAB — VITAMIN D 25 HYDROXY (VIT D DEFICIENCY, FRACTURES): VIT D 25 HYDROXY: 50 ng/mL (ref 30–100)

## 2018-03-09 LAB — HEMOGLOBIN A1C
Hgb A1c MFr Bld: 5.4 % of total Hgb (ref <5.7)
Mean Plasma Glucose: 108 (calc)
eAG (mmol/L): 6 (calc)

## 2018-03-09 LAB — PSA: PSA: <0.1 ng/mL (ref <=4.0)

## 2018-03-09 LAB — TSH: TSH: 2.3 mIU/L (ref 0.40–4.50)

## 2018-03-09 LAB — MAGNESIUM: MAGNESIUM: 1.8 mg/dL (ref 1.5–2.5)

## 2018-03-11 ENCOUNTER — Encounter: Payer: Self-pay | Admitting: Internal Medicine

## 2018-03-11 ENCOUNTER — Other Ambulatory Visit: Payer: Self-pay | Admitting: Internal Medicine

## 2018-03-11 DIAGNOSIS — B351 Tinea unguium: Secondary | ICD-10-CM

## 2018-03-11 DIAGNOSIS — Z79899 Other long term (current) drug therapy: Secondary | ICD-10-CM

## 2018-03-21 ENCOUNTER — Other Ambulatory Visit: Payer: Self-pay | Admitting: Adult Health

## 2018-03-21 DIAGNOSIS — E782 Mixed hyperlipidemia: Secondary | ICD-10-CM

## 2018-03-21 MED FILL — ROSUVASTATIN CALCIUM 40 MG: 40 | 90 days supply | Qty: 90 | Fill #1

## 2018-03-21 MED FILL — AMLODIPINE BESYLATE 5 MG TA: 5 | 90 days supply | Qty: 90 | Fill #1

## 2018-03-21 MED FILL — FENOFIBRATE 134 MG CAPSULE: 134 | 90 days supply | Qty: 90 | Fill #0

## 2018-04-19 ENCOUNTER — Ambulatory Visit: Payer: Medicare HMO

## 2018-04-19 ENCOUNTER — Other Ambulatory Visit: Payer: Self-pay

## 2018-04-19 DIAGNOSIS — L570 Actinic keratosis: Secondary | ICD-10-CM | POA: Diagnosis not present

## 2018-04-19 DIAGNOSIS — C44629 Squamous cell carcinoma of skin of left upper limb, including shoulder: Secondary | ICD-10-CM | POA: Diagnosis not present

## 2018-04-19 DIAGNOSIS — Z79899 Other long term (current) drug therapy: Secondary | ICD-10-CM | POA: Diagnosis not present

## 2018-04-19 DIAGNOSIS — Z85828 Personal history of other malignant neoplasm of skin: Secondary | ICD-10-CM | POA: Diagnosis not present

## 2018-04-19 DIAGNOSIS — D1801 Hemangioma of skin and subcutaneous tissue: Secondary | ICD-10-CM | POA: Diagnosis not present

## 2018-04-19 DIAGNOSIS — C44622 Squamous cell carcinoma of skin of right upper limb, including shoulder: Secondary | ICD-10-CM | POA: Diagnosis not present

## 2018-04-19 DIAGNOSIS — B351 Tinea unguium: Secondary | ICD-10-CM | POA: Diagnosis not present

## 2018-04-19 DIAGNOSIS — L814 Other melanin hyperpigmentation: Secondary | ICD-10-CM | POA: Diagnosis not present

## 2018-04-19 NOTE — Addendum Note (Signed)
Addended by: Dolores Hoose on: 04/19/2018 11:10 AM   Modules accepted: Orders

## 2018-04-19 NOTE — Progress Notes (Signed)
Patient presents to the office for a nurse visit to have labs done. Patient started taking Lamisil and this is a 6 week follow up to check Hepatic Function Panel.  Vitals taken and recorded.

## 2018-04-20 LAB — HEPATIC FUNCTION PANEL
AG Ratio: 1.8 (calc) (ref 1.0–2.5)
ALT: 20 U/L (ref 9–46)
AST: 29 U/L (ref 10–35)
Albumin: 4.3 g/dL (ref 3.6–5.1)
Alkaline phosphatase (APISO): 39 U/L (ref 35–144)
Bilirubin, Direct: 0.1 mg/dL (ref 0.0–0.2)
Globulin: 2.4 g/dL (calc) (ref 1.9–3.7)
Indirect Bilirubin: 0.5 mg/dL (calc) (ref 0.2–1.2)
Total Bilirubin: 0.6 mg/dL (ref 0.2–1.2)
Total Protein: 6.7 g/dL (ref 6.1–8.1)

## 2018-04-25 DIAGNOSIS — Z85828 Personal history of other malignant neoplasm of skin: Secondary | ICD-10-CM | POA: Diagnosis not present

## 2018-04-25 DIAGNOSIS — L570 Actinic keratosis: Secondary | ICD-10-CM | POA: Diagnosis not present

## 2018-06-12 ENCOUNTER — Ambulatory Visit: Payer: Self-pay | Admitting: Adult Health

## 2018-06-12 ENCOUNTER — Other Ambulatory Visit: Payer: Self-pay

## 2018-06-12 ENCOUNTER — Ambulatory Visit: Payer: Medicare HMO | Admitting: Adult Health Nurse Practitioner

## 2018-06-12 ENCOUNTER — Encounter: Payer: Self-pay | Admitting: Adult Health Nurse Practitioner

## 2018-06-12 VITALS — BP 134/66 | HR 76 | Temp 97.5°F | Ht 72.0 in | Wt 179.0 lb

## 2018-06-12 DIAGNOSIS — E782 Mixed hyperlipidemia: Secondary | ICD-10-CM | POA: Diagnosis not present

## 2018-06-12 DIAGNOSIS — Z79899 Other long term (current) drug therapy: Secondary | ICD-10-CM

## 2018-06-12 DIAGNOSIS — N183 Chronic kidney disease, stage 3 unspecified: Secondary | ICD-10-CM

## 2018-06-12 DIAGNOSIS — Z720 Tobacco use: Secondary | ICD-10-CM | POA: Diagnosis not present

## 2018-06-12 DIAGNOSIS — B351 Tinea unguium: Secondary | ICD-10-CM

## 2018-06-12 DIAGNOSIS — Z6823 Body mass index (BMI) 23.0-23.9, adult: Secondary | ICD-10-CM | POA: Diagnosis not present

## 2018-06-12 DIAGNOSIS — E559 Vitamin D deficiency, unspecified: Secondary | ICD-10-CM

## 2018-06-12 DIAGNOSIS — I10 Essential (primary) hypertension: Secondary | ICD-10-CM

## 2018-06-12 DIAGNOSIS — K219 Gastro-esophageal reflux disease without esophagitis: Secondary | ICD-10-CM | POA: Diagnosis not present

## 2018-06-12 DIAGNOSIS — R7309 Other abnormal glucose: Secondary | ICD-10-CM

## 2018-06-12 DIAGNOSIS — Z6824 Body mass index (BMI) 24.0-24.9, adult: Secondary | ICD-10-CM | POA: Diagnosis not present

## 2018-06-12 NOTE — Progress Notes (Signed)
3 Month Follow Up   Assessment and Plan:   Juan Torres was seen today for follow-up.  Diagnoses and all orders for this visit:  Essential hypertension Continue medication: Norvasc 5mg  daily Monitor blood pressure at home; call if consistently over 130/80 Continue DASH diet.   Reminder to go to the ER if any CP, SOB, nausea, dizziness, severe HA, changes vision/speech, left arm numbness and tingling and jaw pain. -     CBC with Differential/Platelet -     COMPLETE METABOLIC PANEL WITH GFR  Hyperlipidemia, mixed Continue medications: Rosuvastatin 40mg  Continue low cholesterol diet and exercise.  Check lipid panel.  -     Lipid panel  Gastroesophageal reflux disease, esophagitis presence not specified Doing well at this time Continue Nexium -     Magnesium  Vitamin D deficiency Taking supplementation -     VITAMIN D 25 Hydroxy (Vit-D Deficiency, Fractures)  CKD (chronic kidney disease) stage 3, GFR 30-59 ml/min (HCC) Increase fluids, avoid NSAIDS, monitor sugars, will monitor -     COMPLETE METABOLIC PANEL WITH GFR  Onychomycosis of great toe Improved Continue Lamisil with benefit  Continuous tobacco abuse  Nicotine cessation counseling Not ready to quit at this time Continue to assess readiness  BMI 24.0-24.9, adult -     Hemoglobin A1c -     Insulin, random  Abnormal glucose -     Hemoglobin A1c -     Insulin, random  Medication management -     CBC with Differential/Platelet -     COMPLETE METABOLIC PANEL WITH GFR -     Magnesium -     Lipid panel -     TSH -     Hemoglobin A1c -     Insulin, random -     VITAMIN D 25 Hydroxy (Vit-D Deficiency, Fractures) -     Vitamin B12 -     Cancel: Pneumococcal conjugate vaccine 13-valent IM  Continue diet and meds as discussed. Further disposition pending results of labs. Discussed med's effects and SE's.   Over 30 minutes of exam, counseling, chart review, and critical decision making was performed.   Future  Appointments  Date Time Provider [place]  09/14/2018 10:30 AM Unk Pinto, MD [facility] None  12/17/2018 11:15 AM Liane Comber, NP [facility] None  03/26/2019  9:00 AM Unk Pinto, MD [facility] None    ----------------------------------------------------------------------------------------------------------------------  HPI 73 y.o. male  presents for 3 month follow up on HTN, HLD, Prediabetes and Vitamin D Deficiency. Other problems include GERD controlled on Nexium 20 mg/OTC. 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 He has had discomfort of his bilat 1st toenails and taking oral lamasil with positive results.  Reports that he snores and his wife reports periods that sound like he is not breathing.  He reports feeling tired in the morning but he wakes up early.  He has never had a formal sleep study.  BMI is Body mass index is 24.28 kg/m., he has been working on diet and exercise. Wt Readings from Last 3 Encounters:  06/12/18 179 lb (81.2 kg)  04/19/18 178 lb (80.7 kg)  03/08/18 175 lb 6.4 oz (79.6 kg)    HTN predates 1998.  HTN CKD3 His blood pressure has been controlled at home, today their BP is BP: 134/66  He does workout. He denies any cardiac symptoms, chest pains, palpitations, shortness of breath, dizziness or lower extremity edema.     He is on cholesterol medication Rosuvastatin and Fenofibrate  and denies myalgias. His cholesterol is not at goal. The cholesterol last visit was:   Lab Results  Component Value Date   CHOL 218 (H) 03/08/2018   HDL 71 03/08/2018   LDLCALC 122 (H) 03/08/2018   TRIG 132 03/08/2018   CHOLHDL 3.1 03/08/2018    He has been working on diet and exercise for prediabetes, and denies hyperglycemia, hypoglycemia , increased appetite, paresthesia of the feet, polydipsia, polyuria, visual disturbances and vomiting. Last A1C in the office was:  Lab Results  Component Value Date   HGBA1C 5.4 03/08/2018    Patient is on Vitamin D supplement.   Lab Results  Component Value Date   VD25OH 50 03/08/2018       Current Medications:  Current Outpatient Medications on File Prior to Visit  Medication Sig  . amLODipine (NORVASC) 5 MG tablet Take 1 tablet (5 mg total) by mouth daily.  Marland Kitchen aspirin 81 MG tablet Take 81 mg by mouth daily.  . B Complex Vitamins (VITAMIN B-COMPLEX PO) Take 1 tablet by mouth daily.  . Cholecalciferol (VITAMIN D) 2000 UNITS CAPS Take 3 capsules by mouth daily.   . cyclobenzaprine (FLEXERIL) 10 MG tablet Take 1 tablet (10 mg total) by mouth 3 (three) times daily as needed for muscle spasms.  . diclofenac sodium (VOLTAREN) 1 % GEL Apply 2 g topically 4 (four) times daily as needed (joint pain).  Marland Kitchen esomeprazole (NEXIUM) 20 MG capsule Take 1 capsule daily for Heartburn  . fenofibrate micronized (LOFIBRA) 134 MG capsule TAKE 1 CAPSULE BY MOUTH EVERY DAY  . Magnesium 400 MG CAPS Take 2 capsules by mouth daily.  . Omega-3 Fatty Acids (FISH OIL PO) Take by mouth.  Marland Kitchen OVER THE COUNTER MEDICATION Taking OTC Nexium daily  . rosuvastatin (CRESTOR) 40 MG tablet Take 1 tablet (40 mg total) by mouth at bedtime.  . terbinafine (LAMISIL) 250 MG tablet Take 1 tablet (250 mg total) by mouth daily.  . vitamin E 1000 UNIT capsule Take 1,000 Units by mouth daily.   No current facility-administered medications on file prior to visit.     Allergies:  Allergies  Allergen Reactions  . Lipitor [Atorvastatin]   . Niacin And Related     Flushing  . Penicillins Rash     Medical History:  Past Medical History:  Diagnosis Date  . Adenomatous colon polyp   . GERD (gastroesophageal reflux disease)   . History of basal cell cancer 2012  . Hyperlipidemia   . Hypertension   . Prediabetes   . Prostate cancer ([facility]) DX 12/19/11   bx=Adenocarcinoma,gleason=3+3=6,volume=42cc,PSA=4.63  . Vitamin D deficiency     Family history- Reviewed and unchanged   Social history- Reviewed and  unchanged   Names of Other Physician/Practitioners you currently use: 1. [facility] here for primary care 2. Eye Exam: [place] 2020 3. Dental Exam, Dr Mariea Clonts Due for 2020 Dermatology: Twice a year, [facility], Dr Pablo Ledger Patient Care Team: Unk Pinto, MD as PCP - General (Internal Medicine) Arloa Koh, MD as Consulting Physician (Radiation Oncology) Clent Jacks, MD as Consulting Physician (Ophthalmology) Edrick Oh, MD as Consulting Physician (Nephrology)   Screening Tests: Immunization History  Administered Date(s) Administered  . DT 10/20/2011  . Pneumococcal Conjugate-13 11/14/2017  . Pneumococcal Polysaccharide-23 04/28/2016  . Zoster 10/20/2011     Vaccinations: TD or Tdap: 2013  Influenza:    Pneumococcal: 04/28/16 Prevnar13: 09/2017 Shingles/Zostavax: 10/20/2011   Preventative Care: Last colonoscopy:2013, due 2023   Imaging: Chest  X-ray: 11/2017 EKG: 02/2018     Review of Systems:  Review of Systems  Constitutional: Negative for chills, diaphoresis, fever, malaise/fatigue and weight loss.  HENT: Negative for congestion, ear discharge, ear pain, hearing loss, nosebleeds, sinus pain, sore throat and tinnitus.   Eyes: Negative for blurred vision, double vision, photophobia, pain, discharge and redness.  Respiratory: Negative for cough, hemoptysis, sputum production, shortness of breath, wheezing and stridor.   Cardiovascular: Negative for chest pain, palpitations, orthopnea, claudication, leg swelling and PND.  Gastrointestinal: Positive for heartburn. Negative for abdominal pain, blood in stool, constipation, diarrhea, melena, nausea and vomiting.  Genitourinary: Negative for dysuria, flank pain, frequency, hematuria and urgency.  Musculoskeletal: Negative for back pain, falls, joint pain, myalgias and neck pain.  Skin: Negative for itching and rash.       White noted to big toe, bilateral nails.   Neurological: Negative for dizziness, tingling, tremors, sensory change, speech change, focal weakness, seizures, loss of consciousness, weakness and headaches.  Endo/Heme/Allergies: Negative for environmental allergies and polydipsia. Does not bruise/bleed easily.  Psychiatric/Behavioral: Negative for depression, hallucinations, memory loss, substance abuse and suicidal ideas. The patient is not nervous/anxious and does not have insomnia.        Unrested sleep, snoring      Physical Exam: BP 134/66   Pulse 76   Temp (!) 97.5 F (36.4 C)   Ht 6' (1.829 m)   Wt 179 lb (81.2 kg)   SpO2 99%   BMI 24.28 kg/m  Wt Readings from Last 3 Encounters:  06/12/18 179 lb (81.2 kg)  04/19/18 178 lb (80.7 kg)  03/08/18 175 lb 6.4 oz (79.6 kg)   General Appearance: Well nourished, in no apparent distress. Eyes: PERRLA, EOMs, conjunctiva no swelling or erythema Sinuses: No Frontal/maxillary tenderness ENT/Mouth: Ext aud canals clear, TMs without erythema, bulging. No erythema, swelling, or exudate on post pharynx.  Tonsils not swollen or erythematous. Hearing normal.  Neck: Supple, thyroid normal.  Respiratory: Respiratory effort normal, BS equal bilaterally without rales, rhonchi, wheezing or stridor.  Cardio: RRR with no MRGs. Brisk peripheral pulses without edema.  Abdomen: Soft, + BS.  Non tender, no guarding, rebound, hernias, masses. Lymphatics: Non tender without lymphadenopathy.  Musculoskeletal: Full ROM, 5/5 strength, normal gait Skin: Warm, dry without rashes, lesions, ecchymosis.  Flush, redness, all surfaces. Neuro: Cranial nerves intact. No cerebellar symptoms.  Psych: Awake and oriented X 3, normal affect, Insight and Judgment appropriate.    Garnet Sierras, NP [facility] 11:26 AM

## 2018-06-12 NOTE — Patient Instructions (Addendum)
We will contact you in 1-3 days with your lab results.  Continue to stay active!  We will put in an referral for a sleep study.  We will contact ou once these studies have started with scheduling.  It may take awhile as the [place] restrictions have impacted this.  Immunization History  Administered Date(s) Administered  . DT 10/20/2011  . Pneumococcal Conjugate-13 11/14/2017  . Pneumococcal Polysaccharide-23 04/28/2016  . Zoster 10/20/2011     Coronavirus (COVID-19) Are you at risk?  Are you at risk for the Coronavirus (COVID-19)?  To be considered HIGH RISK for Coronavirus (COVID-19), you have to meet the following criteria:  . Traveled to [place], [place], [place], [place] or [place]; or in the [place] to [place], [place], [place]  . or [place]; and have fever, cough, and shortness of breath within the last 2 weeks of travel OR . Been in close contact with a person diagnosed with COVID-19 within the last 2 weeks and have  . fever, cough,and shortness of breath .  . IF YOU DO NOT MEET THESE CRITERIA, YOU ARE CONSIDERED LOW RISK FOR COVID-19.  What to do if you are HIGH RISK for COVID-19?  Marland Kitchen If you are having a medical emergency, call 911. . Seek medical care right away. Before you go to a doctor's office, urgent care or emergency department, .  call ahead and tell them about your recent travel, contact with someone diagnosed with COVID-19  .  and your symptoms.  . You should receive instructions from your physician's office regarding next steps of care.  . When you arrive at healthcare provider, tell the healthcare staff immediately you have returned from  . visiting [place], [place], [place], [place] or [place]; or traveled in the [place] to [place], [place],  . [place] or [place] in the last two weeks or you have been in close contact with a person diagnosed with  . COVID-19 in the last 2 weeks.   . Tell the health care staff about your symptoms:  fever, cough and shortness of breath. . After you have been seen by a medical provider, you will be either: o Tested for (COVID-19) and discharged home on quarantine except to seek medical care if  o symptoms worsen, and asked to  - Stay home and avoid contact with others until you get your results (4-5 days)  - Avoid travel on public transportation if possible (such as bus, train, or airplane) or o Sent to the Emergency Department by EMS for evaluation, COVID-19 testing  and  o possible admission depending on your condition and test results.  What to do if you are LOW RISK for COVID-19?  Reduce your risk of any infection by using the same precautions used for avoiding the common cold or flu:  Marland Kitchen Wash your hands often with soap and warm water for at least 20 seconds.  If soap and water are not readily available,  . use an alcohol-based hand sanitizer with at least 60% alcohol.  . If coughing or sneezing, cover your mouth and nose by coughing or sneezing into the elbow areas of your shirt or coat, .  into a tissue or into your sleeve (not your hands). . Avoid shaking hands with others and consider head nods or verbal greetings only. . Avoid touching your eyes, nose, or mouth with unwashed hands.  . Avoid close contact with people who are sick. . Avoid places or events with large  numbers of people in one location, like concerts or sporting events. . Carefully consider travel plans you have or are making. . If you are planning any travel outside or inside the [place], visit the CDC's Travelers' Health webpage for the latest health notices. . If you have some symptoms but not all symptoms, continue to monitor at home and seek medical attention  . if your symptoms worsen. . If you are having a medical emergency, call 911. >>>>>>>>>>>>>>>>>>>>>>>>>>>>>>>>>>>>>>>>>>>>>>>>>>>>>>> We Do NOT Approve of  [facility] [place] Soliciting Our Patients  To Do Home Visits  & We Do NOT Approve of  [employer] > > > > > > > > > > > > > > > > > > > > > > > > > > > > > > > > > > >  > > > >   Preventive Care for Adults  A healthy lifestyle and preventive care can promote health and wellness. Preventive health guidelines for men include the following key practices:  A routine yearly physical is a good way to check with your health care provider about your health and preventative screening. It is a chance to share any concerns and updates on your health and to receive a thorough exam.  Visit your dentist for a routine exam and preventative care every 6 months. Brush your teeth twice a day and floss once a day. Good oral hygiene prevents tooth decay and gum disease.  The frequency of eye exams is based on your age, health, family medical history, use of contact lenses, and other factors. Follow your health care provider's recommendations for frequency of eye exams.  Eat a healthy diet. Foods such as vegetables, fruits, whole grains, low-fat dairy products, and lean protein foods contain the nutrients you need without too many calories. Decrease your intake of foods high in solid fats, added sugars, and salt. Eat the right amount of calories for you. Get information about a proper diet from your health care provider, if necessary.  Regular physical exercise is one of the most important things you can do for your health. Most adults should get at least 150 minutes of moderate-intensity exercise (any activity that increases your heart rate and causes you to sweat) each week. In addition, most adults need muscle-strengthening exercises on 2 or more days a week.  Maintain a healthy weight. The body mass index (BMI) is a screening tool to identify possible weight problems. It provides an estimate of body fat based on height and weight. Your health care provider can find your BMI and can help you achieve or maintain a healthy weight. For adults 20 years and older:  A BMI below 18.5 is considered  underweight.  A BMI of 18.5 to 24.9 is normal.  A BMI of 25 to 29.9 is considered overweight.  A BMI of 30 and above is considered obese.  Maintain normal blood lipids and cholesterol levels by exercising and minimizing your intake of saturated fat. Eat a balanced diet with plenty of fruit and vegetables. Blood tests for lipids and cholesterol should begin at age 74 and be repeated every 5 years. If your lipid or cholesterol levels are high, you are over 50, or you are at high risk for heart disease, you may need your cholesterol levels checked more frequently. Ongoing high lipid and cholesterol levels should be treated with medicines if diet and exercise are not working.  If you smoke, find out from your health care provider how to quit.  If you do not use tobacco, do not start.  Lung cancer screening is recommended for adults aged 37-80 years who are at high risk for developing lung cancer because of a history of smoking. A yearly low-dose CT scan of the lungs is recommended for people who have at least a 30-pack-year history of smoking and are a current smoker or have quit within the past 15 years. A pack year of smoking is smoking an average of 1 pack of cigarettes a day for 1 year (for example: 1 pack a day for 30 years or 2 packs a day for 15 years). Yearly screening should continue until the smoker has stopped smoking for at least 15 years. Yearly screening should be stopped for people who develop a health problem that would prevent them from having lung cancer treatment.  If you choose to drink alcohol, do not have more than 2 drinks per day. One drink is considered to be 12 ounces (355 mL) of beer, 5 ounces (148 mL) of wine, or 1.5 ounces (44 mL) of liquor.  Avoid use of street drugs. Do not share needles with anyone. Ask for help if you need support or instructions about stopping the use of drugs.  High blood pressure causes heart disease and increases the risk of stroke. Your blood  pressure should be checked at least every 1-2 years. Ongoing high blood pressure should be treated with medicines, if weight loss and exercise are not effective.  If you are 89-82 years old, ask your health care provider if you should take aspirin to prevent heart disease.  Diabetes screening involves taking a blood sample to check your fasting blood sugar level. Testing should be considered at a younger age or be carried out more frequently if you are overweight and have at least 1 risk factor for diabetes.  Colorectal cancer can be detected and often prevented. Most routine colorectal cancer screening begins at the age of 84 and continues through age 45. However, your health care provider may recommend screening at an earlier age if you have risk factors for colon cancer. On a yearly basis, your health care provider may provide home test kits to check for hidden blood in the stool. Use of a small camera at the end of a tube to directly examine the colon (sigmoidoscopy or colonoscopy) can detect the earliest forms of colorectal cancer. Talk to your health care provider about this at age 21, when routine screening begins. Direct exam of the colon should be repeated every 5-10 years through age 9, unless early forms of precancerous polyps or small growths are found.  Hepatitis C blood testing is recommended for all people born from [birth year] through 1965 and any individual with known risks for hepatitis C.  Screening for abdominal aortic aneurysm (AAA)  by ultrasound is recommended for people who have history of high blood pressure or who are current or former smokers.  Healthy men should  receive prostate-specific antigen (PSA) blood tests as part of routine cancer screening. Talk with your health care provider about prostate cancer screening.  Testicular cancer screening is  recommended for adult males. Screening includes self-exam, a health care provider exam, and other screening tests. Consult with your  health care provider about any symptoms you have or any concerns you have about testicular cancer.  Use sunscreen. Apply sunscreen liberally and repeatedly throughout the day. You should seek shade when your shadow is shorter than you. Protect yourself by wearing long sleeves, pants, a wide-brimmed hat,  and sunglasses year round, whenever you are outdoors.  Once a month, do a whole-body skin exam, using a mirror to look at the skin on your back. Tell your health care provider about new moles, moles that have irregular borders, moles that are larger than a pencil eraser, or moles that have changed in shape or color.  Stay current with required vaccines (immunizations).  Influenza vaccine. All adults should be immunized every year.  Tetanus, diphtheria, and acellular pertussis (Td, Tdap) vaccine. An adult who has not previously received Tdap or who does not know his vaccine status should receive 1 dose of Tdap. This initial dose should be followed by tetanus and diphtheria toxoids (Td) booster doses every 10 years. Adults with an unknown or incomplete history of completing a 3-dose immunization series with Td-containing vaccines should begin or complete a primary immunization series including a Tdap dose. Adults should receive a Td booster every 10 years.  Zoster vaccine. One dose is recommended for adults aged 4 years or older unless certain conditions are present.    PREVNAR - Pneumococcal 13-valent conjugate (PCV13) vaccine. When indicated, a person who is uncertain of his immunization history and has no record of immunization should receive the PCV13 vaccine. An adult aged 85 years or older who has certain medical conditions and has not been previously immunized should receive 1 dose of PCV13 vaccine. This PCV13 should be followed with a dose of pneumococcal polysaccharide (PPSV23) vaccine. The PPSV23 vaccine dose should be obtained 1 or more year(s)after the dose of PCV13 vaccine. An adult aged  70 years or older who has certain medical conditions and previously received 1 or more doses of PPSV23 vaccine should receive 1 dose of PCV13. The PCV13 vaccine dose should be obtained 1 or more years after the last PPSV23 vaccine dose.    PNEUMOVAX - Pneumococcal polysaccharide (PPSV23) vaccine. When PCV13 is also indicated, PCV13 should be obtained first. All adults aged 64 years and older should be immunized. An adult younger than age 94 years who has certain medical conditions should be immunized. Any person who resides in a nursing home or long-term care facility should be immunized. An adult smoker should be immunized. People with an immunocompromised condition and certain other conditions should receive both PCV13 and PPSV23 vaccines. People with human immunodeficiency virus (HIV) infection should be immunized as soon as possible after diagnosis. Immunization during chemotherapy or radiation therapy should be avoided. Routine use of PPSV23 vaccine is not recommended for American Indians, [place]atives, or people younger than 65 years unless there are medical conditions that require PPSV23 vaccine. When indicated, people who have unknown immunization and have no record of immunization should receive PPSV23 vaccine. One-time revaccination 5 years after the first dose of PPSV23 is recommended for people aged 19-64 years who have chronic kidney failure, nephrotic syndrome, asplenia, or immunocompromised conditions. People who received 1-2 doses of PPSV23 before age 48 years should receive another dose of PPSV23 vaccine at age 57 years or later if at least 5 years have passed since the previous dose. Doses of PPSV23 are not needed for people immunized with PPSV23 at or after age 55 years.    Hepatitis A vaccine. Adults who wish to be protected from this disease, have certain high-risk conditions, work with hepatitis A-infected animals, work in hepatitis A research labs, or travel to or work in countries  with a high rate of hepatitis A should be immunized. Adults who were previously unvaccinated and who anticipate close contact  with an international adoptee during the first 60 days after arrival in the [place] from a country with a high rate of hepatitis A should be immunized.    Hepatitis B vaccine. Adults should be immunized if they wish to be protected from this disease, have certain high-risk conditions, may be exposed to blood or other infectious body fluids, are household contacts or sex partners of hepatitis B positive people, are clients or workers in certain care facilities, or travel to or work in countries with a high rate of hepatitis B.   Preventive Service / Frequency   Ages 45 and over  Blood pressure check.  Lipid and cholesterol check.  Lung cancer screening. / Every year if you are aged 34-80 years and have a 30-pack-year history of smoking and currently smoke or have quit within the past 15 years. Yearly screening is stopped once you have quit smoking for at least 15 years or develop a health problem that would prevent you from having lung cancer treatment.  Fecal occult blood test (FOBT) of stool. You may not have to do this test if you get a colonoscopy every 10 years.  Flexible sigmoidoscopy** or colonoscopy.** / Every 5 years for a flexible sigmoidoscopy or every 10 years for a colonoscopy beginning at age 30 and continuing until age 6.  Hepatitis C blood test.** / For all people born from [birth year] through 1965 and any individual with known risks for hepatitis C.  Abdominal aortic aneurysm (AAA) screening./ Screening current or former smokers or have Hypertension.  Skin self-exam. / Monthly.  Influenza vaccine. / Every year.  Tetanus, diphtheria, and acellular pertussis (Tdap/Td) vaccine.** / 1 dose of Td every 10 years.   Zoster vaccine.** / 1 dose for adults aged 16 years or older.         Pneumococcal 13-valent conjugate (PCV13) vaccine.     Pneumococcal polysaccharide (PPSV23) vaccine.     Hepatitis A vaccine.** / Consult your health care provider.  Hepatitis B vaccine.** / Consult your health care provider. Screening for abdominal aortic aneurysm (AAA)  by ultrasound is recommended for people who have history of high blood pressure or who are current or former smokers. ++++++++++ Recommend Adult Low Dose Aspirin or  coated  Aspirin 81 mg daily  To reduce risk of Colon Cancer 20 %,  Skin Cancer 26 % ,  Malignant Melanoma 46%  and  Pancreatic cancer 60% ++++++++++++++++++++++ Vitamin D goal  is between 70-100.  Please make sure that you are taking your Vitamin D as directed.  It is very important as a natural anti-inflammatory  helping hair, skin, and nails, as well as reducing stroke and heart attack risk.  It helps your bones and helps with mood. It also decreases numerous cancer risks so please take it as directed.  Low Vit D is associated with a 200-300% higher risk for CANCER  and 200-300% higher risk for HEART   ATTACK  &  STROKE.   .....................................Marland Kitchen It is also associated with higher death rate at younger ages,  autoimmune diseases like Rheumatoid arthritis, Lupus, Multiple Sclerosis.    Also many other serious conditions, like depression, Alzheimer's Dementia, infertility, muscle aches, fatigue, fibromyalgia - just to name a few. ++++++++++++++++++++++ Recommend the book "The END of DIETING" by Dr Excell Seltzer  & the book "The END of DIABETES " by Dr Excell Seltzer At [email or web address] - get book & Audio CD's    Being diabetic has a  300% increased risk for heart  attack, stroke, cancer, and alzheimer- type vascular dementia. It is very important that you work harder with diet by avoiding all foods that are white. Avoid white rice (brown & wild rice is OK), white potatoes (sweetpotatoes in moderation is OK), White bread or wheat bread or anything made out of white flour like bagels, donuts,  rolls, buns, biscuits, cakes, pastries, cookies, pizza crust, and pasta (made from white flour & egg whites) - vegetarian pasta or spinach or wheat pasta is OK. Multigrain breads like Arnold's or Pepperidge [employer], or multigrain sandwich thins or flatbreads.  Diet, exercise and weight loss can reverse and cure diabetes in the early stages.  Diet, exercise and weight loss is very important in the control and prevention of complications of diabetes which affects every system in your body, ie. Brain - dementia/stroke, eyes - glaucoma/blindness, heart - heart attack/heart failure, kidneys - dialysis, stomach - gastric paralysis, intestines - malabsorption, nerves - severe painful neuritis, circulation - gangrene & loss of a leg(s), and finally cancer and Alzheimers.    I recommend avoid fried & greasy foods,  sweets/candy, white rice (brown or wild rice or Quinoa is OK), white potatoes (sweet potatoes are OK) - anything made from white flour - bagels, doughnuts, rolls, buns, biscuits,white and wheat breads, pizza crust and traditional pasta made of white flour & egg white(vegetarian pasta or spinach or wheat pasta is OK).  Multi-grain bread is OK - like multi-grain flat bread or sandwich thins. Avoid alcohol in excess. Exercise is also important.    Eat all the vegetables you want - avoid meat, especially red meat and dairy - especially cheese.  Cheese is the most concentrated form of trans-fats which is the worst thing to clog up our arteries. Veggie cheese is OK which can be found in the fresh produce section at [employer] or [employer] or [employer]  ++++++++++++++++++++++ DASH Eating Plan  DASH stands for "Dietary Approaches to Stop Hypertension."   The DASH eating plan is a healthy eating plan that has been shown to reduce high blood pressure (hypertension). Additional health benefits may include reducing the risk of type 2 diabetes mellitus, heart disease, and stroke. The DASH eating plan may also help  with weight loss. WHAT DO I NEED TO KNOW ABOUT THE DASH EATING PLAN? For the DASH eating plan, you will follow these general guidelines:  Choose foods with a percent daily value for sodium of less than 5% (as listed on the food label).  Use salt-free seasonings or herbs instead of table salt or sea salt.  Check with your health care provider or pharmacist before using salt substitutes.  Eat lower-sodium products, often labeled as "lower sodium" or "no salt added."  Eat fresh foods.  Eat more vegetables, fruits, and low-fat dairy products.  Choose whole grains. Look for the word "whole" as the first word in the ingredient list.  Choose fish   Limit sweets, desserts, sugars, and sugary drinks.  Choose heart-healthy fats.  Eat veggie cheese   Eat more home-cooked food and less restaurant, buffet, and fast food.  Limit fried foods.  Cook foods using methods other than frying.  Limit canned vegetables. If you do use them, rinse them well to decrease the sodium.  When eating at a restaurant, ask that your food be prepared with less salt, or no salt if possible.                      WHAT FOODS CAN I  EAT? Read Dr Fara Olden Fuhrman's books on The End of Dieting & The End of Diabetes  Grains Whole grain or whole wheat bread. Brown rice. Whole grain or whole wheat pasta. Quinoa, bulgur, and whole grain cereals. Low-sodium cereals. Corn or whole wheat flour tortillas. Whole grain cornbread. Whole grain crackers. Low-sodium crackers.  Vegetables Fresh or frozen vegetables (raw, steamed, roasted, or grilled). Low-sodium or reduced-sodium tomato and vegetable juices. Low-sodium or reduced-sodium tomato sauce and paste. Low-sodium or reduced-sodium canned vegetables.   Fruits All fresh, canned (in natural juice), or frozen fruits.  Protein Products  All fish and seafood.  Dried beans, peas, or lentils. Unsalted nuts and seeds. Unsalted canned beans.  Dairy Low-fat dairy products, such  as skim or 1% milk, 2% or reduced-fat cheeses, low-fat ricotta or cottage cheese, or plain low-fat yogurt. Low-sodium or reduced-sodium cheeses.  Fats and Oils Tub margarines without trans fats. Light or reduced-fat mayonnaise and salad dressings (reduced sodium). Avocado. Safflower, olive, or canola oils. Natural peanut or almond butter.  Other Unsalted popcorn and pretzels. The items listed above may not be a complete list of recommended foods or beverages. Contact your dietitian for more options.  ++++++++++++++++++++  WHAT FOODS ARE NOT RECOMMENDED? Grains/ White flour or wheat flour White bread. White pasta. White rice. Refined cornbread. Bagels and croissants. Crackers that contain trans fat.  Vegetables  Creamed or fried vegetables. Vegetables in a . Regular canned vegetables. Regular canned tomato sauce and paste. Regular tomato and vegetable juices.  Fruits Dried fruits. Canned fruit in light or heavy syrup. Fruit juice.  Meat and Other Protein Products Meat in general - RED meat & White meat.  Fatty cuts of meat. Ribs, chicken wings, all processed meats as bacon, sausage, bologna, salami, fatback, hot dogs, bratwurst and packaged luncheon meats.  Dairy Whole or 2% milk, cream, half-and-half, and cream cheese. Whole-fat or sweetened yogurt. Full-fat cheeses or blue cheese. Non-dairy creamers and whipped toppings. Processed cheese, cheese spreads, or cheese curds.  Condiments Onion and garlic salt, seasoned salt, table salt, and sea salt. Canned and packaged gravies. Worcestershire sauce. Tartar sauce. Barbecue sauce. Teriyaki sauce. Soy sauce, including reduced sodium. Steak sauce. Fish sauce. Oyster sauce. Cocktail sauce. Horseradish. Ketchup and mustard. Meat flavorings and tenderizers. Bouillon cubes. Hot sauce. Tabasco sauce. Marinades. Taco seasonings. Relishes.  Fats and Oils Butter, stick margarine, lard, shortening and bacon fat. Coconut, palm kernel, or palm oils.  Regular salad dressings.  Pickles and olives. Salted popcorn and pretzels.  The items listed above may not be a complete list of foods and beverages to avoid.

## 2018-06-14 ENCOUNTER — Encounter: Payer: Self-pay | Admitting: Adult Health Nurse Practitioner

## 2018-06-14 DIAGNOSIS — R7309 Other abnormal glucose: Secondary | ICD-10-CM | POA: Insufficient documentation

## 2018-06-14 DIAGNOSIS — B351 Tinea unguium: Secondary | ICD-10-CM | POA: Insufficient documentation

## 2018-06-14 DIAGNOSIS — Z23 Encounter for immunization: Secondary | ICD-10-CM

## 2018-06-14 LAB — CBC WITH DIFFERENTIAL/PLATELET
Absolute Monocytes: 458 cells/uL (ref 200–950)
Basophils Absolute: 21 cells/uL (ref 0–200)
Basophils Relative: 0.4 %
Eosinophils Absolute: 42 cells/uL (ref 15–500)
Eosinophils Relative: 0.8 %
HCT: 51.4 % — ABNORMAL HIGH (ref 38.5–50.0)
Hemoglobin: 18.3 g/dL — ABNORMAL HIGH (ref 13.2–17.1)
Lymphs Abs: 1056 cells/uL (ref 850–3900)
MCH: 36 pg — ABNORMAL HIGH (ref 27.0–33.0)
MCHC: 35.6 g/dL (ref 32.0–36.0)
MCV: 101.2 fL — ABNORMAL HIGH (ref 80.0–100.0)
MPV: 9.8 fL (ref 7.5–12.5)
Monocytes Relative: 8.8 %
Neutro Abs: 3624 cells/uL (ref 1500–7800)
Neutrophils Relative %: 69.7 %
Platelets: 198 10*3/uL (ref 140–400)
RBC: 5.08 10*6/uL (ref 4.20–5.80)
RDW: 12.9 % (ref 11.0–15.0)
Total Lymphocyte: 20.3 %
WBC: 5.2 10*3/uL (ref 3.8–10.8)

## 2018-06-14 LAB — COMPLETE METABOLIC PANEL WITH GFR
AG Ratio: 1.8 (calc) (ref 1.0–2.5)
ALT: 15 U/L (ref 9–46)
AST: 21 U/L (ref 10–35)
Albumin: 4.3 g/dL (ref 3.6–5.1)
Alkaline phosphatase (APISO): 41 U/L (ref 35–144)
BUN/Creatinine Ratio: 16 (calc) (ref 6–22)
BUN: 23 mg/dL (ref 7–25)
CO2: 28 mmol/L (ref 20–32)
Calcium: 10 mg/dL (ref 8.6–10.3)
Chloride: 106 mmol/L (ref 98–110)
Creat: 1.42 mg/dL — ABNORMAL HIGH (ref 0.70–1.18)
GFR, Est African American: 57 mL/min/{1.73_m2} — ABNORMAL LOW (ref >=60)
GFR, Est Non African American: 49 mL/min/{1.73_m2} — ABNORMAL LOW (ref >=60)
Globulin: 2.4 g/dL (calc) (ref 1.9–3.7)
Glucose, Bld: 80 mg/dL (ref 65–99)
Potassium: 4.5 mmol/L (ref 3.5–5.3)
Sodium: 141 mmol/L (ref 135–146)
Total Bilirubin: 0.7 mg/dL (ref 0.2–1.2)
Total Protein: 6.7 g/dL (ref 6.1–8.1)

## 2018-06-14 LAB — VITAMIN B12: Vitamin B-12: 780 pg/mL (ref 200–1100)

## 2018-06-14 LAB — LIPID PANEL
Cholesterol: 284 mg/dL — ABNORMAL HIGH (ref <200)
HDL: 74 mg/dL (ref >=40)
LDL Cholesterol (Calc): 178 mg/dL (calc) — ABNORMAL HIGH
Non-HDL Cholesterol (Calc): 210 mg/dL (calc) — ABNORMAL HIGH (ref <130)
Total CHOL/HDL Ratio: 3.8 (calc) (ref <5.0)
Triglycerides: 175 mg/dL — ABNORMAL HIGH (ref <150)

## 2018-06-14 LAB — HEMOGLOBIN A1C
Hgb A1c MFr Bld: 5.3 % of total Hgb (ref <5.7)
Mean Plasma Glucose: 105 (calc)
eAG (mmol/L): 5.8 (calc)

## 2018-06-14 LAB — VITAMIN D 25 HYDROXY (VIT D DEFICIENCY, FRACTURES): Vit D, 25-Hydroxy: 41 ng/mL (ref 30–100)

## 2018-06-14 LAB — INSULIN, RANDOM: Insulin: 3.6 u[IU]/mL

## 2018-06-14 LAB — MAGNESIUM: Magnesium: 2.1 mg/dL (ref 1.5–2.5)

## 2018-06-14 LAB — TSH: TSH: 3.09 mIU/L (ref 0.40–4.50)

## 2018-06-19 ENCOUNTER — Other Ambulatory Visit: Payer: Self-pay | Admitting: Adult Health Nurse Practitioner

## 2018-06-19 DIAGNOSIS — E782 Mixed hyperlipidemia: Secondary | ICD-10-CM

## 2018-06-19 MED ORDER — FENOFIBRATE 160 MG PO TABS: 160.0000 mg | ORAL_TABLET | Freq: Every day | ORAL | 2 refills | Status: DC

## 2018-07-09 ENCOUNTER — Other Ambulatory Visit: Payer: Self-pay | Admitting: Internal Medicine

## 2018-07-09 DIAGNOSIS — E782 Mixed hyperlipidemia: Secondary | ICD-10-CM

## 2018-07-09 MED FILL — FENOFIBRATE 134 MG CAPSULE: 134 | 90 days supply | Qty: 90 | Fill #1

## 2018-07-09 MED FILL — AMLODIPINE BESYLATE 5 MG TA: 5 | 90 days supply | Qty: 90 | Fill #2

## 2018-07-09 MED FILL — ROSUVASTATIN CALCIUM 40 MG: 40 | 90 days supply | Qty: 90 | Fill #0

## 2018-08-07 ENCOUNTER — Telehealth: Payer: Self-pay | Admitting: *Deleted

## 2018-08-07 DIAGNOSIS — S76211A Strain of adductor muscle, fascia and tendon of right thigh, initial encounter: Secondary | ICD-10-CM | POA: Diagnosis not present

## 2018-08-07 NOTE — Telephone Encounter (Signed)
Spouse called and reported the patient is having severe right groin pain since this morning.  She states the pain is 7 out of 10. She has given him 3 Advil.  Spouse reports she can feel no pedal pulse in his right foot.  The patient was advised to go to the ED, per Dr Melford Aase.

## 2018-09-10 MED FILL — ROSUVASTATIN CALCIUM 40 MG: 40 | 90 days supply | Qty: 90 | Fill #1

## 2018-09-12 DIAGNOSIS — I700 Atherosclerosis of aorta: Secondary | ICD-10-CM | POA: Insufficient documentation

## 2018-09-12 DIAGNOSIS — J439 Emphysema, unspecified: Secondary | ICD-10-CM | POA: Insufficient documentation

## 2018-09-12 NOTE — Progress Notes (Signed)
MEDICARE ANNUAL WELLNESS VISIT AND FOLLOW UP Assessment:   [name] was seen today for medicare wellness and follow-up.  Diagnoses and all orders for this visit:  Atherosclerosis of aorta Control blood pressure, cholesterol, glucose, increase exercise.   Centrilobular emphysema Mild, by imaging; patient denies sx; advised to quit smoking  Essential hypertension Continue medication Monitor blood pressure at home; call if consistently over 130/80 Continue DASH diet.   Reminder to go to the ER if any CP, SOB, nausea, dizziness, severe HA, changes vision/speech, left arm numbness and tingling and jaw pain. -     CMP WITH GFR  Mixed hyperlipidemia -     Continue medication: rosuvastatin 40 mg, fenofibrate 134 mg  -     Historically labile but typically LDL <130; continue current therapy and encourage lifestyle adherence -     CMP/GFR -     TSH -     Lipid panel  Other abnormal glucose       -     Recent A1Cs at goal Discussed diet/exercise, weight management  Defer A1C; check CMP  Vitamin D deficiency/ osteoporosis prophylaxis -     Below goal at recent check;  -     continue to recommend supplementation for goal of 60-100 -     Defer vitamin D level -     He has not increased dose; will increase to 8000 IU daily   Gastroesophageal reflux disease, esophagitis presence not specified -     Continue medications: nexium -     Discussed taper off PPI, patient prefers to defer at this time -     CBC with Differential/Platelet -     CMP/GFR  Prostate cancer ([place])       -     PSA review stable, last undetectable       -     No urinary symptoms, continue monitoring  Continuous tobacco abuse     -       Low-dose screening CT     -       Counseled on reducing/quitting- patient not ready at this time  Snoring/observed apnea no AM sedation or HA Due to reports of loud snoring, observed episodes of apnea, patient preference will proceed with referral to Dr. Brett Fairy for evaluation and  sleep study  Right groin mass/tenderness ? Inguinal Fat Hernia vs soft tissue mass; discussed with Dr. Melford Aase and will proceed with [place] to confirm vs r/o; refer to gen surgery as indicated  Over 30 minutes of exam, counseling, chart review, and critical decision making was performed  Future Appointments  Date Time Provider Margaret  12/31/2018 11:30 AM Unk Pinto, MD [facility] None  03/26/2019  9:00 AM Unk Pinto, MD [facility] None     Plan:   During the course of the visit the patient was educated and counseled about appropriate screening and preventive services including:    Pneumococcal vaccine   Influenza vaccine  Prevnar 13  Td vaccine  Screening electrocardiogram  Colorectal cancer screening  Diabetes screening  Glaucoma screening  Nutrition counseling    Subjective:  Juan Torres is a 73 y.o. male who presents for Medicare Annual Wellness Visit and 3 month follow up for HTN, hyperlipidemia, prediabetes, and vitamin D Def.    He reports 2-3 weeks ago he bent over to pick up his grandson and experienced sudden R groin pain with radiation to knee, saw ortho, underwent PT per their recommendation, then later noted a lump in his groin  that is very tender, with burning and tenderness, wife was able to "reduce" and he is concerned about a hernia.   He additionally reports snoring, wife has observed episodes of apnea, would like to be referred for a sleep study.   he currently continues to smoke 1 pack a day; discussed risks associated with smoking, patient is not ready to quit. Has 30+ pack/year smoking hx, is undergoing annual low dose lung CT screenings. Last in 11/2017 and appeared benign though shows aortic atherosclerosis and mild centrilobular emphysema.   The patient has been very active over the past year- taking walks ~1 hr daily pushing his grandson in his stroller at a brisk pace. Patient is pleased with his weight progress, recounts  excellent dietary choices he is making; presents with normalized BMI- previously noted in overweight range.   he has a diagnosis of GERD which is currently managed by nexium 20 mg daily.  he reports symptoms is currently well controlled, and denies breakthrough reflux, burning in chest, hoarseness or cough.    BMI is Body mass index is 23.33 kg/m., he has been working on diet and exercise. Wt Readings from Last 3 Encounters:  09/13/18 172 lb (78 kg)  06/12/18 179 lb (81.2 kg)  04/19/18 178 lb (80.7 kg)   His blood pressure has been controlled at home, today their BP is BP: (!) 146/82  He does workout. He denies chest pain, shortness of breath, dizziness.   He is on cholesterol medication (rosuvastatin 40 mg daily, fenofibrate 160 mg daily) and denies myalgias. His cholesterol is not at goal. The cholesterol last visit was:   Lab Results  Component Value Date   CHOL 284 (H) 06/12/2018   HDL 74 06/12/2018   LDLCALC 178 (H) 06/12/2018   TRIG 175 (H) 06/12/2018   CHOLHDL 3.8 06/12/2018    He has been working on diet and exercise for glucose management, and denies hyperglycemia, hypoglycemia , nausea, paresthesia of the feet, polydipsia and polyuria. Last A1C in the office was:  Lab Results  Component Value Date   HGBA1C 5.3 06/12/2018   Last GFR was:  Lab Results  Component Value Date   GFRNONAA 49 (L) 06/12/2018   Patient is on Vitamin D supplement (taking 6000 IU daily) and near goal:  Lab Results  Component Value Date   VD25OH 41 06/12/2018      Medication Review: Current Outpatient Medications on File Prior to Visit  Medication Sig Dispense Refill  . amLODipine (NORVASC) 5 MG tablet Take 1 tablet (5 mg total) by mouth daily. 90 tablet 3  . aspirin 81 MG tablet Take 81 mg by mouth daily.    . B Complex Vitamins (VITAMIN B-COMPLEX PO) Take 1 tablet by mouth daily.    . Cholecalciferol (VITAMIN D) 2000 UNITS CAPS Take 4 capsules by mouth daily.    Marland Kitchen esomeprazole (NEXIUM)  20 MG capsule Take 1 capsule daily for Heartburn    . fenofibrate 160 MG tablet Take 1 tablet (160 mg total) by mouth daily. 90 tablet 2  . Magnesium 400 MG CAPS Take 2 capsules by mouth daily.    . Omega-3 Fatty Acids (FISH OIL PO) Take by mouth.    . rosuvastatin (CRESTOR) 40 MG tablet TAKE 1 TABLET BY MOUTH AT BEDTIME. 90 tablet 1  . vitamin E 1000 UNIT capsule Take 1,000 Units by mouth daily.     No current facility-administered medications on file prior to visit.     Allergies: Allergies  Allergen  Reactions  . Lipitor [Atorvastatin]   . Niacin And Related     Flushing  . Penicillins Rash    Current Problems (verified) has Prostate cancer ([place]); GERD (gastroesophageal reflux disease); Hyperlipidemia, mixed; Essential hypertension; Vitamin D deficiency; Medication management; BMI 23.0-23.9, adult; Continuous tobacco abuse; CKD (chronic kidney disease) stage 3, GFR 30-59 ml/min ([place]); Abnormal glucose; Thoracic aorta atherosclerosis ([place]); and Emphysema lung ([place]) on their problem list.  Screening Tests Immunization History  Administered Date(s) Administered  . DT 10/20/2011  . Pneumococcal Conjugate-13 11/14/2017  . Pneumococcal Polysaccharide-23 04/28/2016  . Zoster 10/20/2011   Preventative care: Last colonoscopy: 12/02/2011 due 2023 Chest CT screen: 11/29/2017 will order to schedule for this year  Prior vaccinations: TD or Tdap: 2013  Influenza: DUE declines Pneumococcal 23: 04/28/2016 Prevnar13: 11/2017 Shingles/Zostavax: 10/20/2011   Names of Other Physician/Practitioners you currently use: 1. [facility] here for primary care 2. Dr. Katy Fitch, eye doctor, last visit  2020 3. Dr. Mariea Clonts, dentist, last visit 2020, goes q13m 4. [facility], last visit 2020  Patient Care Team: Unk Pinto, MD as PCP - General (Internal Medicine) Arloa Koh, MD (Inactive) as Consulting Physician (Radiation Oncology) Clent Jacks, MD as Consulting Physician (Ophthalmology) Edrick Oh, MD as Consulting Physician (Nephrology)  Surgical: He  has a past surgical history that includes Prostate biopsy (12/19/2011); Colonoscopy w/ polypectomy; Radioactive seed implant (N/A, 04/04/2012); and Cystoscopy (N/A, 04/04/2012). Family His family history includes Breast cancer in his mother; Cancer in his maternal grandfather; Dementia in his father; Heart attack (age of onset: 38) in his father; Prostate cancer in his cousin and maternal uncle; Ulcers in his maternal grandmother. Social history  He reports that he has been smoking cigarettes. He has a 30.00 pack-year smoking history. He has never used smokeless tobacco. He reports current alcohol use of about 36.0 standard drinks of alcohol per week. He reports current drug use. Frequency: 6.00 times per week. Drug: Marijuana.  MEDICARE WELLNESS OBJECTIVES: Physical activity: Current Exercise Habits: Home exercise routine, Type of exercise: walking, Time (Minutes): 60, Frequency (Times/Week): 7, Weekly Exercise (Minutes/Week): 420, Intensity: Mild, Exercise limited by: None identified Cardiac risk factors: Cardiac Risk Factors include: advanced age (>42men, >60 women);male gender;hypertension;smoking/ tobacco exposure;dyslipidemia Depression/mood screen:   Depression screen [facility] 2/9 09/13/2018  Decreased Interest 0  Down, Depressed, Hopeless 0  PHQ - 2 Score 0    ADLs:  In your present state of health, do you have any difficulty performing the following activities: 09/13/2018 03/07/2018  Hearing? N N  Comment - -  Vision? N N  Difficulty concentrating or making decisions? N N  Walking or climbing stairs? N N  Dressing or bathing? N N  Doing errands, shopping? N N  Some recent data might be hidden     Cognitive Testing  Alert? Yes  Normal Appearance?Yes  Oriented to person? Yes  Place? Yes   Time? Yes  Recall of three objects?  Yes  Can perform simple calculations?  Yes  Displays appropriate judgment?Yes  Can read the correct time from a watch face?Yes  EOL planning: Does Patient Have a Medical Advance Directive?: No Would patient like information on creating a medical advance directive?: No - Patient declined   Review of Systems  Constitutional: Negative for malaise/fatigue and weight loss.  HENT: Negative for hearing loss and tinnitus.   Eyes: Negative for blurred vision and double vision.  Respiratory: Negative for cough, sputum production, shortness of breath and wheezing.   Cardiovascular: Negative  for chest pain, palpitations, orthopnea, claudication, leg swelling and PND.  Gastrointestinal: Negative for abdominal pain, blood in stool, constipation, diarrhea, heartburn, melena, nausea and vomiting.  Genitourinary: Negative.   Musculoskeletal: Negative for falls, joint pain and myalgias.       Groin pain  Skin: Negative for rash.  Neurological: Negative for dizziness, tingling, sensory change, weakness and headaches.  Endo/Heme/Allergies: Negative for polydipsia.  Psychiatric/Behavioral: Negative.  Negative for depression, memory loss, substance abuse and suicidal ideas. The patient is not nervous/anxious and does not have insomnia.   All other systems reviewed and are negative.    Objective:   Today's Vitals   09/13/18 0901  BP: (!) 146/82  Pulse: 79  Temp: (!) 97.5 F (36.4 C)  SpO2: 98%  Weight: 172 lb (78 kg)   Body mass index is 23.33 kg/m.  General appearance: alert, red through face/neck , no distress, WD/WN, male HEENT: normocephalic, sclerae anicteric, TMs pearly, nares patent, no discharge or erythema, pharynx normal, normal hearing with bilateral hearing aids Oral cavity: MMM, no lesions Neck: supple, no lymphadenopathy, no thyromegaly, no masses Heart: RRR, normal S1, S2, no murmurs Lungs: Symmetrical, somewhat coarse sounds throughout, not diminished, no wheezes, rhonchi, or rales Abdomen: +bs, soft,  non  distended,  no hepatomegaly, no splenomegaly; he has soft subtle swelling/mass suficially to right groin/suprapubic, non-reducible, does not bulge with cough/bearing down Musculoskeletal: Mildly tender through right medial quad with mild bruising, no palpable muscular detachment, no swelling, no obvious deformity Extremities: no edema, no cyanosis, no clubbing Pulses: 2+ symmetric, upper and lower extremities, normal cap refill Neurological: alert, oriented x 3, CN2-12 intact, strength normal upper extremities and lower extremities, sensation normal throughout, DTRs 2+ throughout, no cerebellar signs, gait normal Psychiatric: normal affect, behavior normal, pleasant   Medicare Attestation I have personally reviewed: The patient's medical and social history Their use of alcohol, tobacco or illicit drugs Their current medications and supplements The patient's functional ability including ADLs,fall risks, home safety risks, cognitive, and hearing and visual impairment Diet and physical activities Evidence for depression or mood disorders  The patient's weight, height, BMI, and visual acuity have been recorded in the chart.  I have made referrals, counseling, and provided education to the patient based on review of the above and I have provided the patient with a written personalized care plan for preventive services.     Izora Ribas, NP   09/13/2018

## 2018-09-13 ENCOUNTER — Ambulatory Visit: Payer: Medicare HMO | Admitting: Adult Health

## 2018-09-13 ENCOUNTER — Other Ambulatory Visit: Payer: Self-pay

## 2018-09-13 ENCOUNTER — Encounter: Payer: Self-pay | Admitting: Adult Health

## 2018-09-13 VITALS — BP 146/82 | HR 79 | Temp 97.5°F | Wt 172.0 lb

## 2018-09-13 DIAGNOSIS — N183 Chronic kidney disease, stage 3 unspecified: Secondary | ICD-10-CM

## 2018-09-13 DIAGNOSIS — Z6823 Body mass index (BMI) 23.0-23.9, adult: Secondary | ICD-10-CM | POA: Diagnosis not present

## 2018-09-13 DIAGNOSIS — Z720 Tobacco use: Secondary | ICD-10-CM

## 2018-09-13 DIAGNOSIS — I10 Essential (primary) hypertension: Secondary | ICD-10-CM

## 2018-09-13 DIAGNOSIS — E782 Mixed hyperlipidemia: Secondary | ICD-10-CM

## 2018-09-13 DIAGNOSIS — R7309 Other abnormal glucose: Secondary | ICD-10-CM | POA: Diagnosis not present

## 2018-09-13 DIAGNOSIS — Z0001 Encounter for general adult medical examination with abnormal findings: Secondary | ICD-10-CM

## 2018-09-13 DIAGNOSIS — R6889 Other general symptoms and signs: Secondary | ICD-10-CM | POA: Diagnosis not present

## 2018-09-13 DIAGNOSIS — R0683 Snoring: Secondary | ICD-10-CM

## 2018-09-13 DIAGNOSIS — Z79899 Other long term (current) drug therapy: Secondary | ICD-10-CM

## 2018-09-13 DIAGNOSIS — B351 Tinea unguium: Secondary | ICD-10-CM

## 2018-09-13 DIAGNOSIS — Z122 Encounter for screening for malignant neoplasm of respiratory organs: Secondary | ICD-10-CM

## 2018-09-13 DIAGNOSIS — I700 Atherosclerosis of aorta: Secondary | ICD-10-CM

## 2018-09-13 DIAGNOSIS — J432 Centrilobular emphysema: Secondary | ICD-10-CM

## 2018-09-13 DIAGNOSIS — K219 Gastro-esophageal reflux disease without esophagitis: Secondary | ICD-10-CM | POA: Diagnosis not present

## 2018-09-13 DIAGNOSIS — C61 Malignant neoplasm of prostate: Secondary | ICD-10-CM | POA: Diagnosis not present

## 2018-09-13 DIAGNOSIS — R1909 Other intra-abdominal and pelvic swelling, mass and lump: Secondary | ICD-10-CM

## 2018-09-13 DIAGNOSIS — E559 Vitamin D deficiency, unspecified: Secondary | ICD-10-CM

## 2018-09-13 DIAGNOSIS — Z0000 Encounter for general adult medical examination without abnormal findings: Secondary | ICD-10-CM

## 2018-09-13 LAB — CBC WITH DIFFERENTIAL/PLATELET
Absolute Monocytes: 328 cells/uL (ref 200–950)
Basophils Absolute: 18 cells/uL (ref 0–200)
Basophils Relative: 0.5 %
Eosinophils Absolute: 40 cells/uL (ref 15–500)
Eosinophils Relative: 1.1 %
HCT: 45.8 % (ref 38.5–50.0)
Hemoglobin: 16.5 g/dL (ref 13.2–17.1)
Lymphs Abs: 929 cells/uL (ref 850–3900)
MCH: 35.3 pg — ABNORMAL HIGH (ref 27.0–33.0)
MCHC: 36 g/dL (ref 32.0–36.0)
MCV: 98.1 fL (ref 80.0–100.0)
MPV: 9.7 fL (ref 7.5–12.5)
Monocytes Relative: 9.1 %
Neutro Abs: 2286 cells/uL (ref 1500–7800)
Neutrophils Relative %: 63.5 %
Platelets: 168 10*3/uL (ref 140–400)
RBC: 4.67 10*6/uL (ref 4.20–5.80)
RDW: 12.7 % (ref 11.0–15.0)
Total Lymphocyte: 25.8 %
WBC: 3.6 10*3/uL — ABNORMAL LOW (ref 3.8–10.8)

## 2018-09-13 LAB — COMPLETE METABOLIC PANEL WITH GFR
AG Ratio: 2 (calc) (ref 1.0–2.5)
ALT: 24 U/L (ref 9–46)
AST: 30 U/L (ref 10–35)
Albumin: 4.5 g/dL (ref 3.6–5.1)
Alkaline phosphatase (APISO): 36 U/L (ref 35–144)
BUN: 14 mg/dL (ref 7–25)
CO2: 25 mmol/L (ref 20–32)
Calcium: 10.1 mg/dL (ref 8.6–10.3)
Chloride: 106 mmol/L (ref 98–110)
Creat: 1.08 mg/dL (ref 0.70–1.18)
GFR, Est African American: 79 mL/min/{1.73_m2} (ref >=60)
GFR, Est Non African American: 68 mL/min/{1.73_m2} (ref >=60)
Globulin: 2.2 g/dL (calc) (ref 1.9–3.7)
Glucose, Bld: 88 mg/dL (ref 65–99)
Potassium: 4.1 mmol/L (ref 3.5–5.3)
Sodium: 140 mmol/L (ref 135–146)
Total Bilirubin: 1 mg/dL (ref 0.2–1.2)
Total Protein: 6.7 g/dL (ref 6.1–8.1)

## 2018-09-13 LAB — TSH: TSH: 2.27 mIU/L (ref 0.40–4.50)

## 2018-09-13 LAB — LIPID PANEL
Cholesterol: 247 mg/dL — ABNORMAL HIGH (ref <200)
HDL: 69 mg/dL (ref >=40)
LDL Cholesterol (Calc): 145 mg/dL (calc) — ABNORMAL HIGH
Non-HDL Cholesterol (Calc): 178 mg/dL (calc) — ABNORMAL HIGH (ref <130)
Total CHOL/HDL Ratio: 3.6 (calc) (ref <5.0)
Triglycerides: 189 mg/dL — ABNORMAL HIGH (ref <150)

## 2018-09-13 LAB — MAGNESIUM: Magnesium: 1.7 mg/dL (ref 1.5–2.5)

## 2018-09-13 NOTE — Patient Instructions (Addendum)
Juan Torres , Thank you for taking time to come for your Medicare Wellness Visit. I appreciate your ongoing commitment to your health goals. Please review the following plan we discussed and let me know if I can assist you in the future.   These are the goals we discussed: Goals    . Blood Pressure < 130/80       This is a list of the screening recommended for you and due dates:  Health Maintenance  Topic Date Due  . Flu Shot  09/08/2018  .  Hepatitis C: One time screening is recommended by Center for Disease Control  (CDC) for  adults born from [birth year] through 1965.   11/15/2018*  . Tetanus Vaccine  10/19/2021  . Colon Cancer Screening  12/01/2021  . Pneumonia vaccines  Addressed  *Topic was postponed. The date shown is not the original due date.    Please call [phone] to schedule a time for your ultrasound to confirm or rule out inguinal hernia   Inguinal Hernia, Adult An inguinal hernia develops when fat or the intestines push through a weak spot in a muscle where your leg meets your lower abdomen (groin). This creates a bulge. This kind of hernia could also be:  In your scrotum, if you are male.  In folds of skin around your vagina, if you are male. There are three types of inguinal hernias:  Hernias that can be pushed back into the abdomen (are reducible). This type rarely causes pain.  Hernias that are not reducible (are incarcerated).  Hernias that are not reducible and lose their blood supply (are strangulated). This type of hernia requires emergency surgery. What are the causes? This condition is caused by having a weak spot in the muscles or tissues in the groin. This weak spot develops over time. The hernia may poke through the weak spot when you suddenly strain your lower abdominal muscles, such as when you:  Lift a heavy object.  Strain to have a bowel movement. Constipation can lead to straining.  Cough. What increases the risk? This condition is more  likely to develop in:  Men.  Pregnant women.  People who: ? Are overweight. ? Work in jobs that require long periods of standing or heavy lifting. ? Have had an inguinal hernia before. ? Smoke or have lung disease. These factors can lead to long-lasting (chronic) coughing. What are the signs or symptoms? Symptoms may depend on the size of the hernia. Often, a small inguinal hernia has no symptoms. Symptoms of a larger hernia may include:  A lump in the groin area. This is easier to see when standing. It might not be visible when lying down.  Pain or burning in the groin. This may get worse when lifting, straining, or coughing.  A dull ache or a feeling of pressure in the groin.  In men, an unusual lump in the scrotum. Symptoms of a strangulated inguinal hernia may include:  A bulge in your groin that is very painful and tender to the touch.  A bulge that turns red or purple.  Fever, nausea, and vomiting.  Inability to have a bowel movement or to pass gas. How is this diagnosed? This condition is diagnosed based on your symptoms, your medical history, and a physical exam. Your health care provider may feel your groin area and ask you to cough. How is this treated? Treatment depends on the size of your hernia and whether you have symptoms. If you do  not have symptoms, your health care provider may have you watch your hernia carefully and have you come in for follow-up visits. If your hernia is large or if you have symptoms, you may need surgery to repair the hernia. Follow these instructions at home: Lifestyle  Avoid lifting heavy objects.  Avoid standing for long periods of time.  Do not use any products that contain nicotine or tobacco, such as cigarettes and e-cigarettes. If you need help quitting, ask your health care provider.  Maintain a healthy weight. Preventing constipation  Take actions to prevent constipation. Constipation leads to straining with bowel  movements, which can make a hernia worse or cause a hernia repair to break down. Your health care provider may recommend that you: ? Drink enough fluid to keep your urine pale yellow. ? Eat foods that are high in fiber, such as fresh fruits and vegetables, whole grains, and beans. ? Limit foods that are high in fat and processed sugars, such as fried or sweet foods. ? Take an over-the-counter or prescription medicine for constipation. General instructions  You may try to push the hernia back in place by very gently pressing on it while lying down. Do not try to force the bulge back in if it will not push in easily.  Watch your hernia for any changes in shape, size, or color. Get help right away if you notice any changes.  Take over-the-counter and prescription medicines only as told by your health care provider.  Keep all follow-up visits as told by your health care provider. This is important. Contact a health care provider if:  You have a fever.  You develop new symptoms.  Your symptoms get worse. Get help right away if:  You have pain in your groin that suddenly gets worse.  You have a bulge in your groin that: ? Suddenly gets bigger and does not get smaller. ? Becomes red or purple or painful to the touch.  You are a man and you have a sudden pain in your scrotum, or the size of your scrotum suddenly changes.  You cannot push the hernia back in place by very gently pressing on it when you are lying down. Do not try to force the bulge back in if it will not push in easily.  You have nausea or vomiting that does not go away.  You have a fast heartbeat.  You cannot have a bowel movement or pass gas. These symptoms may represent a serious problem that is an emergency. Do not wait to see if the symptoms will go away. Get medical help right away. Call your local emergency services (911 in the [place] Summary  An inguinal hernia develops when fat or the intestines push through a  weak spot in a muscle where your leg meets your lower abdomen (groin).  This condition is caused by having a weak spot in muscles or tissue in your groin.  Symptoms may depend on the size of the hernia, and they may include pain or swelling in your groin. A small inguinal hernia often has no symptoms.  Treatment may not be needed if you do not have symptoms. If you have symptoms or a large hernia, you may need surgery to repair the hernia.  Avoid lifting heavy objects. Also avoid standing for long amounts of time. This information is not intended to replace advice given to you by your health care provider. Make sure you discuss any questions you have with your health care provider. Document  Released: 06/12/2008 Document Revised: 02/25/2017 Document Reviewed: 10/26/2016 Elsevier Patient Education  2020 [employer].

## 2018-09-14 ENCOUNTER — Ambulatory Visit: Payer: Self-pay | Admitting: Adult Health

## 2018-09-17 ENCOUNTER — Ambulatory Visit
Admission: RE | Admit: 2018-09-17 | Discharge: 2018-09-17 | Disposition: A | Discharge status: Home / Self Care | Payer: Medicare HMO | Source: Ambulatory Visit | Attending: Adult Health | Admitting: Adult Health

## 2018-09-17 DIAGNOSIS — R1909 Other intra-abdominal and pelvic swelling, mass and lump: Secondary | ICD-10-CM

## 2018-09-17 DIAGNOSIS — K4090 Unilateral inguinal hernia, without obstruction or gangrene, not specified as recurrent: Secondary | ICD-10-CM | POA: Diagnosis not present

## 2018-09-19 ENCOUNTER — Other Ambulatory Visit: Payer: Self-pay | Admitting: Adult Health

## 2018-09-19 ENCOUNTER — Encounter: Payer: Self-pay | Admitting: Adult Health

## 2018-09-19 DIAGNOSIS — K4090 Unilateral inguinal hernia, without obstruction or gangrene, not specified as recurrent: Secondary | ICD-10-CM | POA: Insufficient documentation

## 2018-09-24 ENCOUNTER — Other Ambulatory Visit: Payer: Self-pay | Admitting: Adult Health

## 2018-09-24 DIAGNOSIS — E782 Mixed hyperlipidemia: Secondary | ICD-10-CM

## 2018-09-24 MED FILL — FENOFIBRATE 134 MG CAPSULE: 134 | 90 days supply | Qty: 90 | Fill #0

## 2018-09-26 DIAGNOSIS — H10413 Chronic giant papillary conjunctivitis, bilateral: Secondary | ICD-10-CM | POA: Diagnosis not present

## 2018-09-26 DIAGNOSIS — Z961 Presence of intraocular lens: Secondary | ICD-10-CM | POA: Diagnosis not present

## 2018-09-26 DIAGNOSIS — H0102A Squamous blepharitis right eye, upper and lower eyelids: Secondary | ICD-10-CM | POA: Diagnosis not present

## 2018-09-26 DIAGNOSIS — H0102B Squamous blepharitis left eye, upper and lower eyelids: Secondary | ICD-10-CM | POA: Diagnosis not present

## 2018-09-26 DIAGNOSIS — H26493 Other secondary cataract, bilateral: Secondary | ICD-10-CM | POA: Diagnosis not present

## 2018-09-26 DIAGNOSIS — H04123 Dry eye syndrome of bilateral lacrimal glands: Secondary | ICD-10-CM | POA: Diagnosis not present

## 2018-09-26 DIAGNOSIS — H35373 Puckering of macula, bilateral: Secondary | ICD-10-CM | POA: Diagnosis not present

## 2018-09-27 ENCOUNTER — Ambulatory Visit: Payer: Medicare HMO

## 2018-10-02 ENCOUNTER — Encounter: Payer: Self-pay | Admitting: Neurology

## 2018-10-02 ENCOUNTER — Other Ambulatory Visit: Payer: Self-pay

## 2018-10-02 ENCOUNTER — Ambulatory Visit: Payer: Medicare HMO | Admitting: Neurology

## 2018-10-02 VITALS — BP 155/98 | HR 112 | Temp 97.5°F | Ht 72.0 in | Wt 168.0 lb

## 2018-10-02 DIAGNOSIS — I158 Other secondary hypertension: Secondary | ICD-10-CM | POA: Diagnosis not present

## 2018-10-02 DIAGNOSIS — I700 Atherosclerosis of aorta: Secondary | ICD-10-CM

## 2018-10-02 DIAGNOSIS — K219 Gastro-esophageal reflux disease without esophagitis: Secondary | ICD-10-CM | POA: Diagnosis not present

## 2018-10-02 DIAGNOSIS — G4730 Sleep apnea, unspecified: Secondary | ICD-10-CM

## 2018-10-02 DIAGNOSIS — G4710 Hypersomnia, unspecified: Secondary | ICD-10-CM | POA: Diagnosis not present

## 2018-10-02 DIAGNOSIS — N183 Chronic kidney disease, stage 3 unspecified: Secondary | ICD-10-CM

## 2018-10-02 DIAGNOSIS — J432 Centrilobular emphysema: Secondary | ICD-10-CM | POA: Diagnosis not present

## 2018-10-02 DIAGNOSIS — R0683 Snoring: Secondary | ICD-10-CM | POA: Diagnosis not present

## 2018-10-02 DIAGNOSIS — Z720 Tobacco use: Secondary | ICD-10-CM | POA: Diagnosis not present

## 2018-10-02 NOTE — Patient Instructions (Signed)

## 2018-10-02 NOTE — Progress Notes (Signed)
SLEEP MEDICINE CLINIC    Provider:  Larey Seat, MD  Primary Care Physician:  Unk Pinto, MD [address] [place] [address]     Referring Provider: Unk Pinto, [place],   [id number]          Chief Complaint according to patient   Patient presents with:    . New Patient (Initial Visit)           HISTORY OF PRESENT ILLNESS:  Juan Torres is a 73 y.o. year old  male patient seen on 10/02/2018 . Chief concern according to patient :" I have an inguinal hernia surgery coming up and I want to know if my wife is right about me having apnea.   I have the pleasure of seeing Juan Torres today, a right -handed White or Caucasian male with a possible sleep disorder. He  has a past medical history of Adenomatous colon polyp, GERD (gastroesophageal reflux disease), History of basal cell cancer (2012), Hyperlipidemia, Hypertension, Prediabetes, Prostate cancer (Juan Torres) (DX 12/19/11), and Vitamin D deficiency.    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 They watch grandson [name] during the day.  The patient used to work in  24 hour shifts. Pets are present- a cat.  Tobacco use daily.  ETOH use ; beer and a couple of shots of rum daily, Caffeine intake in form of Coffee( none  ) Soda( none) Tea ( rarely) or energy drinks. Regular exercise in form of walking before his hernia started bothering him.   Juan Torres is gardening.    Sleep habits are as follows: The patient's dinner time is between 5-6 PM. The patient watches tv, drinks beer , and goes to bed at 8-9 PM. No difficulties to enter sleep , and he continues to sleep for 3-4 hours, wakes for coughing, he denies acid reflux on omeprazole.   The preferred sleep position is lateral , with the support of 1 pillow. Dreams are reportedly rare. 5-7 AM is the  usual rise time. The patient wakes up spontaneously.  He reports not feeling refreshed or restored in AM,  He feels as if in a fog- with symptoms such as dry mouth,and residual fatigue. Naps are taken infrequently.  Review of Systems: Out of a complete 14 system review, the patient complains of only the following symptoms, and all other reviewed systems are negative.:  Fatigue,witnessed apnea, snoring, fragmented sleep- coughing.  Motorcycle .    How likely are you to doze in the following situations: 0 = not likely, 1 = slight chance, 2 = moderate chance, 3 = high chance   Sitting and Reading? Watching Television? Sitting inactive in a public place (theater or meeting)? As a passenger in a car for an hour without a break? Lying down in the afternoon when circumstances permit? Sitting and talking to someone? Sitting quietly after lunch without alcohol? In a car, while stopped for a few minutes in traffic?   Total = 2/ 24 points   FSS endorsed at 21/ 63 points.   Social History   Socioeconomic History  . Marital status: Married    Spouse name: Not on file  . Number of children: 2  . Years of education: Not on file  . Highest education level: Not on file  Occupational History  . Not on file  Social Needs  . Financial resource strain: Not on file  . Food insecurity    Worry: Not on file    Inability: Not on file  . Transportation needs    Medical: Not on file    Non-medical: Not on file  Tobacco Use  . Smoking status: Current Every Day Smoker    Packs/day: 1.00    Years: 30.00    Pack years: 30.00    Types: Cigarettes  . Smokeless tobacco: Never Used  . Tobacco comment: smokes cigarettes and marijuana nearly daily  Substance and Sexual Activity  . Alcohol use: Yes    Alcohol/week: 36.0 standard drinks    Types: 36 Cans of beer per week    Comment: 4-6 beers daily, tequila  . Drug use: Yes    Frequency: 6.0 times per week    Types: Marijuana    Comment: / marijuana   . Sexual activity: Not on file  Lifestyle  . Physical activity    Days per week: Not on file    Minutes per session: Not on file  . Stress: Not on file  Relationships  . Social Herbalist on phone: Not on file    Gets together: Not on file    Attends religious service: Not on file    Active member of club or organization: Not on file    Attends meetings of clubs or organizations: Not on file    Relationship status: Not on file  Other Topics Concern  . Not on file  Social History Narrative  . Not on file    Family History  Problem Relation Age of Onset  . Breast cancer Mother   . Cancer Maternal Grandfather        colon  . Heart attack Father 64  . Dementia Father   . Prostate cancer Maternal Uncle        prostate  . Prostate cancer Cousin        prostate cancer 1st maternal   . Ulcers Maternal Grandmother     Past Medical History:  Diagnosis Date  . Adenomatous colon polyp   . GERD (gastroesophageal reflux disease)   . History of basal cell cancer 2012  . Hyperlipidemia   . Hypertension   . Prediabetes   . Prostate cancer ([place]) DX 12/19/11   bx=Adenocarcinoma,gleason=3+3=6,volume=42cc,PSA=4.63  . Vitamin D deficiency     Past Surgical History:  Procedure Laterality Date  . COLONOSCOPY W/ POLYPECTOMY     Adenomatous  . CYSTOSCOPY N/A 04/04/2012   Procedure: CYSTOSCOPY;  Surgeon: Molli Hazard, MD;  [facility];  Service: Urology;  Laterality: N/A;  . PROSTATE BIOPSY  12/19/2011   Procedure: BIOPSY TRANSRECTAL ULTRASONIC PROSTATE (TUBP);  Surgeon: Molli Hazard, MD;  Location: [facility];  Service: Urology;  Laterality: N/A;    . RADIOACTIVE SEED IMPLANT N/A 04/04/2012   Procedure: RADIOACTIVE SEED IMPLANT;  Surgeon: Molli Hazard, MD;  Location: [facility];  Service: Urology;  Laterality: N/A;  Seeds Implanted     72  Seeds Found in Bladder     None      Current  Outpatient Medications on File Prior to Visit  Medication Sig Dispense Refill  . amLODipine (NORVASC) 5 MG tablet Take 1 tablet (5 mg total) by mouth daily. 90 tablet 3  . aspirin 81 MG tablet Take 81 mg by mouth daily.    . B Complex Vitamins (VITAMIN B-COMPLEX PO) Take  1 tablet by mouth daily.    . Cholecalciferol (VITAMIN D) 2000 UNITS CAPS Take 4 capsules by mouth daily.    Marland Kitchen esomeprazole (NEXIUM) 20 MG capsule Take 1 capsule daily for Heartburn    . fenofibrate 160 MG tablet Take 1 tablet (160 mg total) by mouth daily. 90 tablet 2  . fenofibrate micronized (LOFIBRA) 134 MG capsule TAKE 1 CAPSULE BY MOUTH EVERY DAY 90 capsule 1  . Magnesium 400 MG CAPS Take 2 capsules by mouth daily.    . Omega-3 Fatty Acids (FISH OIL PO) Take by mouth.    . rosuvastatin (CRESTOR) 40 MG tablet TAKE 1 TABLET BY MOUTH AT BEDTIME. 90 tablet 1  . vitamin E 1000 UNIT capsule Take 1,000 Units by mouth daily.     No current facility-administered medications on file prior to visit.     Allergies  Allergen Reactions  . Lipitor [Atorvastatin]   . Niacin And Related     Flushing  . Penicillins Rash    Physical exam:  Today's Vitals   10/02/18 0840  BP: (!) 155/98  Pulse: (!) 112  Temp: (!) 97.5 F (36.4 C)  Weight: 168 lb (76.2 kg)  Height: 6' (1.829 m)   Body mass index is 22.78 kg/m.   Wt Readings from Last 3 Encounters:  10/02/18 168 lb (76.2 kg)  09/13/18 172 lb (78 kg)  06/12/18 179 lb (81.2 kg)     Ht Readings from Last 3 Encounters:  10/02/18 6' (1.829 m)  06/12/18 6' (1.829 m)  03/08/18 6' (1.829 m)      General: The patient is awake, alert and appears not in acute distress. The patient is well groomed. Head: Normocephalic, atraumatic. Neck is supple. Mallampati 3 - tongue is trembling,   neck circumference: 15 inches . Nasal airflow  patent.   Dental status:  Right upper plate  Cardiovascular:  Regular rate and cardiac rhythm by pulse,  without distended neck veins.  Respiratory: Lungs are clear to auscultation.  Skin:  facial erythema, sun damaged skin, Without evidence of ankle edema, or rash. Trunk: The patient's posture is erect.   Neurologic exam : The patient is awake and alert, oriented to place and time.   Memory subjective described as intact.  Attention span & concentration ability appears normal.  Speech is fluent, without dysarthria, dysphonia or aphasia.  Mood and affect are appropriate.   Cranial nerves: no loss of smell or taste reported  Pupils are equal and briskly reactive to light. Funduscopic exam deferred.  Extraocular movements in vertical and horizontal planes were intact and without nystagmus.  No Diplopia. Visual fields by finger perimetry are intact. Hearing was impaired to  finger rubbing.   Facial sensation intact to fine touch. Facial motor strength is symmetric and tongue and uvula move midline.  Neck ROM : Rotation, tilt and flexion extension were normal for age and shoulder shrug was symmetrical.    Motor exam:  Symmetric bulk, tone and ROM.   Normal tone without cog wheeling, symmetric grip strength . Sensory:  Fine touch, pinprick and vibration were tested and normal.  Proprioception tested in the upper extremities was normal.  Coordination: Rapid alternating movements in the fingers/hands were of normal speed.  The Finger-to-nose maneuver was intact with evidence of mild ataxia, dysmetria and tremor. Abrupt movements.  Gait and station: Patient could rise unassisted from a seated position, walked without assistive device.  Stance is of normal width and the patient turned with 3 steps.  Toe and heel walk were deferred.  Deep tendon reflexes: in the  upper and lower extremities are symmetric and intact.  Babinski response was deferred.       After spending a total time of 30 minutes face to face and additional time for physical and neurologic examination, review of laboratory studies,  personal review of  imaging studies, reports and results of other testing and review of referral information / records as far as provided in visit, I have established the following assessments:  1)  Apnea has been witnessed by the patient's spouse Max Sane , Therapist, sports.  2)  Hypertension. Tachycardia were noted, also tremors. This is not sleep related.  3)  High alcohol consumer.    My Plan is to proceed with:  1) He would like to use the HST over attended sleep study, I will order both.     I would like to thank Unk Pinto, MD  for allowing me to meet with and to take care of this pleasant patient.   In short, Juan Torres is presenting with snoring, apnea , and active tobacco use- and he drinks alcohol every night- this places him at higher risk of apnea and of loud snoring but also hypoxemia.  I plan to follow up either personally or through our NP within 2 month.  . Electronically signed by: Larey Seat, MD 10/02/2018 9:16 AM  [facility] and [facility] Sleep Board certified by The [employer] of Sleep Medicine and Diplomate of the [employer] of Sleep Medicine. Board certified In Neurology through the [employer], Fellow of the [employer] of Neurology. Medical Director of [employer].

## 2018-10-04 DIAGNOSIS — K4020 Bilateral inguinal hernia, without obstruction or gangrene, not specified as recurrent: Secondary | ICD-10-CM | POA: Diagnosis not present

## 2018-10-16 ENCOUNTER — Other Ambulatory Visit (HOSPITAL_COMMUNITY)
Admission: RE | Admit: 2018-10-16 | Discharge: 2018-10-16 | Disposition: A | Discharge status: Home / Self Care | Payer: Medicare HMO | Source: Ambulatory Visit | Attending: Surgery | Admitting: Surgery

## 2018-10-16 ENCOUNTER — Ambulatory Visit: Payer: Self-pay | Admitting: Surgery

## 2018-10-16 DIAGNOSIS — Z01812 Encounter for preprocedural laboratory examination: Secondary | ICD-10-CM | POA: Insufficient documentation

## 2018-10-16 DIAGNOSIS — Z20828 Contact with and (suspected) exposure to other viral communicable diseases: Secondary | ICD-10-CM | POA: Diagnosis not present

## 2018-10-16 NOTE — Patient Instructions (Addendum)
DUE TO COVID-19 ONLY ONE VISITOR IS ALLOWED TO COME WITH YOU AND STAY IN THE WAITING ROOM ONLY DURING PRE OP AND PROCEDURE DAY OF SURGERY. THE 1 VISITOR MAY VISIT WITH YOU AFTER SURGERY IN YOUR PRIVATE ROOM DURING VISITING HOURS ONLY!   ONCE YOUR COVID TEST IS COMPLETED, PLEASE BEGIN THE QUARANTINE INSTRUCTIONS AS OUTLINED IN YOUR HANDOUT.                Juan Torres  10/16/2018   Your procedure is scheduled on: Thursday 10/18/2018   Report to [facility] Main  Entrance              Report to  Short Stay  At 0530  AM     Call this number if you have problems the morning of surgery [phone]    Remember: Do not eat food or drink liquids :After Midnight.               BRUSH YOUR TEETH MORNING OF SURGERY AND RINSE YOUR MOUTH OUT, NO CHEWING GUM CANDY OR MINTS.     Take these medicines the morning of surgery with A SIP OF WATER: Amlodipine (Norvasc)                                 You may not have any metal on your body including hair pins and              piercings  Do not wear jewelry, make-up, lotions, powders or perfumes, deodorant                          Men may shave face and neck.   Do not bring valuables to the hospital. [place].  Contacts, dentures or bridgework may not be worn into surgery.  Leave suitcase in the car. After surgery it may be brought to your room.     Patients discharged the day of surgery will not be allowed to drive home. IF YOU ARE HAVING SURGERY AND GOING HOME THE SAME DAY, YOU MUST HAVE AN ADULT TO DRIVE YOU HOME AND BE WITH YOU FOR 24 HOURS. YOU MAY GO HOME BY TAXI OR UBER OR ORTHERWISE, BUT AN ADULT MUST ACCOMPANY YOU HOME AND STAY WITH YOU FOR 24 HOURS.  Name and phone number of your driver: spouse- [name] cell-[phone]               Please read over the following fact sheets you were given: _____________________________________________________________________ [facility] -  Preparing for Surgery Before surgery, you can play an important role.  Because skin is not sterile, your skin needs to be as free of germs as possible.  You can reduce the number of germs on your skin by washing with CHG (chlorahexidine gluconate) soap before surgery.  CHG is an antiseptic cleaner which kills germs and bonds with the skin to continue killing germs even after washing. Please DO NOT use if you have an allergy to CHG or antibacterial soaps.  If your skin becomes reddened/irritated stop using the CHG and inform your nurse when you arrive at Short Stay. Do not shave (including legs and underarms) for at least 48 hours prior to the first CHG shower.  You may shave your face/neck. Please follow these instructions carefully:  1.  Shower with CHG Soap the night before surgery and the  morning of Surgery.  2.  If you choose to wash your hair, wash your hair first as usual with your  normal  shampoo.  3.  After you shampoo, rinse your hair and body thoroughly to remove the  shampoo.                           4.  Use CHG as you would any other liquid soap.  You can apply chg directly  to the skin and wash                       Gently with a scrungie or clean washcloth.  5.  Apply the CHG Soap to your body ONLY FROM THE NECK DOWN.   Do not use on face/ open                           Wound or open sores. Avoid contact with eyes, ears mouth and genitals (private parts).                       Wash face,  Genitals (private parts) with your normal soap.             6.  Wash thoroughly, paying special attention to the area where your surgery  will be performed.  7.  Thoroughly rinse your body with warm water from the neck down.  8.  DO NOT shower/wash with your normal soap after using and rinsing off  the CHG Soap.                9.  Pat yourself dry with a clean towel.            10.  Wear clean pajamas.            11.  Place clean sheets on your bed the night of your first shower and do not  sleep  with pets. Day of Surgery : Do not apply any lotions/deodorants the morning of surgery.  Please wear clean clothes to the hospital/surgery center.  FAILURE TO FOLLOW THESE INSTRUCTIONS MAY RESULT IN THE CANCELLATION OF YOUR SURGERY PATIENT SIGNATURE_________________________________  NURSE SIGNATURE__________________________________  ________________________________________________________________________

## 2018-10-17 ENCOUNTER — Encounter (HOSPITAL_COMMUNITY)
Admission: RE | Admit: 2018-10-17 | Discharge: 2018-10-17 | Disposition: A | Discharge status: Home / Self Care | Payer: Medicare HMO | Source: Ambulatory Visit | Attending: Surgery | Admitting: Surgery

## 2018-10-17 ENCOUNTER — Other Ambulatory Visit: Payer: Self-pay

## 2018-10-17 ENCOUNTER — Encounter (HOSPITAL_COMMUNITY): Payer: Self-pay

## 2018-10-17 DIAGNOSIS — K4020 Bilateral inguinal hernia, without obstruction or gangrene, not specified as recurrent: Secondary | ICD-10-CM | POA: Diagnosis present

## 2018-10-17 DIAGNOSIS — R69 Illness, unspecified: Secondary | ICD-10-CM | POA: Diagnosis not present

## 2018-10-17 DIAGNOSIS — F17210 Nicotine dependence, cigarettes, uncomplicated: Secondary | ICD-10-CM | POA: Diagnosis not present

## 2018-10-17 DIAGNOSIS — Z888 Allergy status to other drugs, medicaments and biological substances status: Secondary | ICD-10-CM | POA: Diagnosis not present

## 2018-10-17 DIAGNOSIS — J439 Emphysema, unspecified: Secondary | ICD-10-CM | POA: Diagnosis not present

## 2018-10-17 DIAGNOSIS — Z880 Allergy status to penicillin: Secondary | ICD-10-CM | POA: Diagnosis not present

## 2018-10-17 DIAGNOSIS — I129 Hypertensive chronic kidney disease with stage 1 through stage 4 chronic kidney disease, or unspecified chronic kidney disease: Secondary | ICD-10-CM | POA: Diagnosis not present

## 2018-10-17 DIAGNOSIS — Z8546 Personal history of malignant neoplasm of prostate: Secondary | ICD-10-CM | POA: Diagnosis not present

## 2018-10-17 DIAGNOSIS — N183 Chronic kidney disease, stage 3 (moderate): Secondary | ICD-10-CM | POA: Diagnosis not present

## 2018-10-17 LAB — BASIC METABOLIC PANEL
Anion gap: 14 (ref 5–15)
BUN: 13 mg/dL (ref 8–23)
CO2: 21 mmol/L — ABNORMAL LOW (ref 22–32)
Calcium: 9.9 mg/dL (ref 8.9–10.3)
Chloride: 105 mmol/L (ref 98–111)
Creatinine, Ser: 1.12 mg/dL (ref 0.61–1.24)
GFR calc Af Amer: >60 mL/min (ref >60)
GFR calc non Af Amer: >60 mL/min (ref >60)
Glucose, Bld: 88 mg/dL (ref 70–99)
Potassium: 4.2 mmol/L (ref 3.5–5.1)
Sodium: 140 mmol/L (ref 135–145)

## 2018-10-17 LAB — CBC
HCT: 49.9 % (ref 39.0–52.0)
Hemoglobin: 17.5 g/dL — ABNORMAL HIGH (ref 13.0–17.0)
MCH: 34.6 pg — ABNORMAL HIGH (ref 26.0–34.0)
MCHC: 35.1 g/dL (ref 30.0–36.0)
MCV: 98.6 fL (ref 80.0–100.0)
Platelets: 182 10*3/uL (ref 150–400)
RBC: 5.06 MIL/uL (ref 4.22–5.81)
RDW: 12.8 % (ref 11.5–15.5)
WBC: 5.1 10*3/uL (ref 4.0–10.5)
nRBC: 0 % (ref 0.0–0.2)

## 2018-10-17 LAB — SARS CORONAVIRUS 2 (TAT 6-24 HRS): SARS Coronavirus 2: NEGATIVE

## 2018-10-17 MED ORDER — BUPIVACAINE LIPOSOME 1.3 % IJ SUSP
20.0000 mL | Freq: Once | INTRAMUSCULAR | Status: DC
  Filled 2018-10-17: qty 20

## 2018-10-17 NOTE — Progress Notes (Signed)
PCP - Dr. Melford Aase Cardiologist - N/A  Chest x-ray - N/A  12/07/2017- in [place]- CT chest (lung cancer screening)  EKG - 03/07/18 Stress Test - N/A ECHO - N/A Cardiac Cath - N/A  Sleep Study - to have one done at [facility] on 10/24/2018 CPAP -   Fasting Blood Sugar - in chart has DX-Pre-Diabetes Patient states that he has never been told that he is a Pre-Diabetic Checks Blood Sugar ___0__ times a day  Blood Thinner Instructions: Aspirin Instructions:Patient takes Aspirin 81 mg every other day now. Dr. Melford Aase prescribes it. Patient states was not given instructions of when to stop the Aspirin.  Instructed patient to call Dr. Idell Pickles office and ask then when he was suppose to stop the aspirin.Patient verbalized understanding. Last Dose:10/16/2018  Anesthesia review:   Chart given to Ward Givens to review  Patient denies shortness of breath, fever, cough and chest pain at PAT appointment   Patient verbalized understanding of instructions that were given to them at the PAT appointment. Patient was also instructed that they will need to review over the PAT instructions again at home before surgery.

## 2018-10-17 NOTE — Progress Notes (Signed)
   10/17/18 1335  OBSTRUCTIVE SLEEP APNEA  Have you ever been diagnosed with sleep apnea through a sleep study? No  If yes, do you have and use a CPAP or BPAP machine every night? 0 (will start it on 10/24/2018)  Do you snore loudly (loud enough to be heard through closed doors)?  1  Do you often feel tired, fatigued, or sleepy during the daytime (such as falling asleep during driving or talking to someone)? 1  Has anyone observed you stop breathing during your sleep? 0  Do you have, or are you being treated for high blood pressure? 1  Age > 50 (1-yes) 1  Neck circumference greater than:Male 16 inches or larger, Male 17inches or larger? 0  Male Gender (Yes=1) 1  Obstructive Sleep Apnea Score 5  Score 5 or greater  Results sent to PCP

## 2018-10-17 NOTE — Anesthesia Preprocedure Evaluation (Addendum)
Anesthesia Evaluation  Patient identified by MRN, date of birth, ID band Patient awake    Reviewed: Allergy & Precautions, NPO status , Patient's Chart, lab work & pertinent test results  Airway Mallampati: I  TM Distance: >3 FB Neck ROM: Full    Dental no notable dental hx. (+) Teeth Intact, Dental Advisory Given   Pulmonary Current Smoker,    Pulmonary exam normal breath sounds clear to auscultation       Cardiovascular hypertension, Normal cardiovascular exam Rhythm:Regular Rate:Normal  HLD   Neuro/Psych negative neurological ROS  negative psych ROS   GI/Hepatic GERD  Medicated and Controlled,(+)     substance abuse  alcohol use and marijuana use,   Endo/Other  negative endocrine ROS  Renal/GU Renal InsufficiencyRenal disease  negative genitourinary   Musculoskeletal negative musculoskeletal ROS (+)   Abdominal   Peds  Hematology negative hematology ROS (+)   Anesthesia Other Findings   Reproductive/Obstetrics                            Anesthesia Physical Anesthesia Plan  ASA: III  Anesthesia Plan: General   Post-op Pain Management:    Induction: Intravenous  PONV Risk Score and Plan: 2 and Dexamethasone and Ondansetron  Airway Management Planned: Oral ETT  Additional Equipment:   Intra-op Plan:   Post-operative Plan: Extubation in OR  Informed Consent: I have reviewed the patients History and Physical, chart, labs and discussed the procedure including the risks, benefits and alternatives for the proposed anesthesia with the patient or authorized representative who has indicated his/her understanding and acceptance.     Dental advisory given  Plan Discussed with: CRNA  Anesthesia Plan Comments:         Anesthesia Quick Evaluation

## 2018-10-18 ENCOUNTER — Encounter (HOSPITAL_COMMUNITY)
Admission: RE | Disposition: A | Discharge status: Home / Self Care | Payer: Self-pay | Source: Home / Self Care | Attending: Surgery

## 2018-10-18 ENCOUNTER — Ambulatory Visit (HOSPITAL_COMMUNITY)
Admission: RE | Admit: 2018-10-18 | Discharge: 2018-10-18 | Disposition: A | Discharge status: Home / Self Care | Payer: Medicare HMO | Attending: Surgery | Admitting: Surgery

## 2018-10-18 ENCOUNTER — Other Ambulatory Visit: Payer: Self-pay

## 2018-10-18 ENCOUNTER — Ambulatory Visit (HOSPITAL_COMMUNITY): Payer: Medicare HMO | Admitting: Certified Registered Nurse Anesthetist

## 2018-10-18 ENCOUNTER — Encounter (HOSPITAL_COMMUNITY): Payer: Self-pay

## 2018-10-18 DIAGNOSIS — Z888 Allergy status to other drugs, medicaments and biological substances status: Secondary | ICD-10-CM | POA: Diagnosis not present

## 2018-10-18 DIAGNOSIS — K4020 Bilateral inguinal hernia, without obstruction or gangrene, not specified as recurrent: Secondary | ICD-10-CM | POA: Diagnosis not present

## 2018-10-18 DIAGNOSIS — Z880 Allergy status to penicillin: Secondary | ICD-10-CM | POA: Insufficient documentation

## 2018-10-18 DIAGNOSIS — N183 Chronic kidney disease, stage 3 (moderate): Secondary | ICD-10-CM | POA: Insufficient documentation

## 2018-10-18 DIAGNOSIS — I129 Hypertensive chronic kidney disease with stage 1 through stage 4 chronic kidney disease, or unspecified chronic kidney disease: Secondary | ICD-10-CM | POA: Insufficient documentation

## 2018-10-18 DIAGNOSIS — F17210 Nicotine dependence, cigarettes, uncomplicated: Secondary | ICD-10-CM | POA: Insufficient documentation

## 2018-10-18 DIAGNOSIS — Z8546 Personal history of malignant neoplasm of prostate: Secondary | ICD-10-CM | POA: Insufficient documentation

## 2018-10-18 DIAGNOSIS — E782 Mixed hyperlipidemia: Secondary | ICD-10-CM | POA: Diagnosis not present

## 2018-10-18 DIAGNOSIS — J439 Emphysema, unspecified: Secondary | ICD-10-CM | POA: Diagnosis not present

## 2018-10-18 DIAGNOSIS — R69 Illness, unspecified: Secondary | ICD-10-CM | POA: Diagnosis not present

## 2018-10-18 DIAGNOSIS — K4090 Unilateral inguinal hernia, without obstruction or gangrene, not specified as recurrent: Secondary | ICD-10-CM | POA: Diagnosis not present

## 2018-10-18 HISTORY — PX: INGUINAL HERNIA REPAIR: SHX194

## 2018-10-18 LAB — HEMOGLOBIN A1C
Hgb A1c MFr Bld: 5.4 % (ref 4.8–5.6)
Mean Plasma Glucose: 108 mg/dL

## 2018-10-18 SURGERY — REPAIR, HERNIA, INGUINAL, BILATERAL, ADULT
Anesthesia: General | Site: Abdomen | Laterality: Bilateral

## 2018-10-18 MED ORDER — MIDAZOLAM HCL 2 MG/2ML IJ SOLN
INTRAMUSCULAR | Status: AC
  Filled 2018-10-18: qty 2

## 2018-10-18 MED ORDER — DEXAMETHASONE SODIUM PHOSPHATE 4 MG/ML IJ SOLN
INTRAMUSCULAR | Status: DC | PRN
  Administered 2018-10-18: 5 mg via INTRAVENOUS

## 2018-10-18 MED ORDER — LACTATED RINGERS IV SOLN
INTRAVENOUS | Status: DC
  Administered 2018-10-18 (×2): via INTRAVENOUS

## 2018-10-18 MED ORDER — PROPOFOL 10 MG/ML IV BOLUS
INTRAVENOUS | Status: AC
  Filled 2018-10-18: qty 20

## 2018-10-18 MED ORDER — BUPIVACAINE HCL (PF) 0.25 % IJ SOLN
INTRAMUSCULAR | Status: AC
  Filled 2018-10-18: qty 30

## 2018-10-18 MED ORDER — KETAMINE HCL 10 MG/ML IJ SOLN
INTRAMUSCULAR | Status: DC | PRN
  Administered 2018-10-18: 20 mg via INTRAVENOUS
  Administered 2018-10-18: 10 mg via INTRAVENOUS

## 2018-10-18 MED ORDER — 0.9 % SODIUM CHLORIDE (POUR BTL) OPTIME
TOPICAL | Status: DC | PRN
  Administered 2018-10-18: 08:00:00 1000 mL

## 2018-10-18 MED ORDER — CHLORHEXIDINE GLUCONATE CLOTH 2 % EX PADS: 6.0000 | MEDICATED_PAD | Freq: Once | CUTANEOUS | Status: DC

## 2018-10-18 MED ORDER — PROPOFOL 10 MG/ML IV BOLUS
INTRAVENOUS | Status: DC | PRN
  Administered 2018-10-18: 200 mg via INTRAVENOUS

## 2018-10-18 MED ORDER — SUGAMMADEX SODIUM 200 MG/2ML IV SOLN
INTRAVENOUS | Status: DC | PRN
  Administered 2018-10-18: 200 mg via INTRAVENOUS

## 2018-10-18 MED ORDER — DEXAMETHASONE SODIUM PHOSPHATE 10 MG/ML IJ SOLN
INTRAMUSCULAR | Status: AC
  Filled 2018-10-18: qty 1

## 2018-10-18 MED ORDER — HYDROCODONE-ACETAMINOPHEN 5-325 MG PO TABS: 1.0000 | ORAL_TABLET | Freq: Four times a day (QID) | ORAL | 0 refills | Status: DC | PRN

## 2018-10-18 MED ORDER — LIDOCAINE 2% (20 MG/ML) 5 ML SYRINGE
INTRAMUSCULAR | Status: AC
  Filled 2018-10-18: qty 5

## 2018-10-18 MED ORDER — FENTANYL CITRATE (PF) 100 MCG/2ML IJ SOLN
INTRAMUSCULAR | Status: AC
  Filled 2018-10-18: qty 2

## 2018-10-18 MED ORDER — FENTANYL CITRATE (PF) 250 MCG/5ML IJ SOLN
INTRAMUSCULAR | Status: AC
  Filled 2018-10-18: qty 5

## 2018-10-18 MED ORDER — ONDANSETRON HCL 4 MG/2ML IJ SOLN
INTRAMUSCULAR | Status: DC | PRN
  Administered 2018-10-18: 4 mg via INTRAVENOUS

## 2018-10-18 MED ORDER — BUPIVACAINE LIPOSOME 1.3 % IJ SUSP
INTRAMUSCULAR | Status: DC | PRN
  Administered 2018-10-18: 20 mL

## 2018-10-18 MED ORDER — PHENYLEPHRINE 40 MCG/ML (10ML) SYRINGE FOR IV PUSH (FOR BLOOD PRESSURE SUPPORT)
PREFILLED_SYRINGE | INTRAVENOUS | Status: DC | PRN
  Administered 2018-10-18: 80 ug via INTRAVENOUS
  Administered 2018-10-18: 120 ug via INTRAVENOUS

## 2018-10-18 MED ORDER — LABETALOL HCL 5 MG/ML IV SOLN
INTRAVENOUS | Status: AC
  Filled 2018-10-18: qty 4

## 2018-10-18 MED ORDER — ROCURONIUM BROMIDE 10 MG/ML (PF) SYRINGE
PREFILLED_SYRINGE | INTRAVENOUS | Status: AC
  Filled 2018-10-18: qty 10

## 2018-10-18 MED ORDER — ACETAMINOPHEN 500 MG PO TABS
1000.0000 mg | ORAL_TABLET | ORAL | Status: AC
  Administered 2018-10-18: 1000 mg via ORAL
  Filled 2018-10-18: qty 2

## 2018-10-18 MED ORDER — KETAMINE HCL 10 MG/ML IJ SOLN
INTRAMUSCULAR | Status: AC
  Filled 2018-10-18: qty 1

## 2018-10-18 MED ORDER — ROCURONIUM BROMIDE 10 MG/ML (PF) SYRINGE
PREFILLED_SYRINGE | INTRAVENOUS | Status: DC | PRN
  Administered 2018-10-18: 70 mg via INTRAVENOUS
  Administered 2018-10-18: 10 mg via INTRAVENOUS

## 2018-10-18 MED ORDER — FENTANYL CITRATE (PF) 100 MCG/2ML IJ SOLN
25.0000 ug | INTRAMUSCULAR | Status: DC | PRN
  Administered 2018-10-18 (×2): 50 ug via INTRAVENOUS

## 2018-10-18 MED ORDER — SODIUM CHLORIDE 0.9 % IR SOLN
Status: DC | PRN
  Administered 2018-10-18: 1000 mL

## 2018-10-18 MED ORDER — MIDAZOLAM HCL 5 MG/5ML IJ SOLN
INTRAMUSCULAR | Status: DC | PRN
  Administered 2018-10-18: 2 mg via INTRAVENOUS

## 2018-10-18 MED ORDER — PHENYLEPHRINE 40 MCG/ML (10ML) SYRINGE FOR IV PUSH (FOR BLOOD PRESSURE SUPPORT)
PREFILLED_SYRINGE | INTRAVENOUS | Status: AC
  Filled 2018-10-18: qty 10

## 2018-10-18 MED ORDER — ONDANSETRON HCL 4 MG/2ML IJ SOLN
INTRAMUSCULAR | Status: AC
  Filled 2018-10-18: qty 2

## 2018-10-18 MED ORDER — LIDOCAINE HCL 2 % IJ SOLN
INTRAMUSCULAR | Status: AC
  Filled 2018-10-18: qty 20

## 2018-10-18 MED ORDER — LIDOCAINE 2% (20 MG/ML) 5 ML SYRINGE
INTRAMUSCULAR | Status: DC | PRN
  Administered 2018-10-18: 60 mg via INTRAVENOUS

## 2018-10-18 MED ORDER — LIDOCAINE 20MG/ML (2%) 15 ML SYRINGE OPTIME
INTRAMUSCULAR | Status: DC | PRN
  Administered 2018-10-18: 1.5 mg/kg/h via INTRAVENOUS

## 2018-10-18 MED ORDER — FENTANYL CITRATE (PF) 100 MCG/2ML IJ SOLN
INTRAMUSCULAR | Status: DC | PRN
  Administered 2018-10-18: 50 ug via INTRAVENOUS
  Administered 2018-10-18: 100 ug via INTRAVENOUS
  Administered 2018-10-18 (×2): 50 ug via INTRAVENOUS
  Administered 2018-10-18: 100 ug via INTRAVENOUS

## 2018-10-18 MED ORDER — CEFAZOLIN SODIUM-DEXTROSE 2-4 GM/100ML-% IV SOLN
2.0000 g | INTRAVENOUS | Status: AC
  Administered 2018-10-18: 08:00:00 2 g via INTRAVENOUS
  Filled 2018-10-18: qty 100

## 2018-10-18 MED FILL — HYDROCODON-APAP 5-325: 5-325 | 4 days supply | Qty: 15 | Fill #0

## 2018-10-18 SURGICAL SUPPLY — 41 items
BENZOIN TINCTURE PRP APPL 2/3 (GAUZE/BANDAGES/DRESSINGS) ×2 IMPLANT
CABLE HIGH FREQUENCY MONO STRZ (ELECTRODE) ×2 IMPLANT
COVER SURGICAL LIGHT HANDLE (MISCELLANEOUS) ×2 IMPLANT
COVER WAND RF STERILE (DRAPES) IMPLANT
DECANTER SPIKE VIAL GLASS SM (MISCELLANEOUS) ×2 IMPLANT
DERMABOND ADVANCED (GAUZE/BANDAGES/DRESSINGS) ×1
DERMABOND ADVANCED .7 DNX12 (GAUZE/BANDAGES/DRESSINGS) ×1 IMPLANT
DEVICE SECURE STRAP 25 ABSORB (INSTRUMENTS) IMPLANT
DISSECT BALLN SPACEMKR + OVL (BALLOONS) ×2
DISSECTOR BALLN SPACEMKR + OVL (BALLOONS) ×1 IMPLANT
DISSECTOR BLUNT TIP ENDO 5MM (MISCELLANEOUS) ×2 IMPLANT
ELECT PENCIL ROCKER SW 15FT (MISCELLANEOUS) ×2 IMPLANT
ELECT REM PT RETURN 15FT ADLT (MISCELLANEOUS) ×2 IMPLANT
ENDOLOOP SUT PDS II  0 18 (SUTURE) ×1
ENDOLOOP SUT PDS II 0 18 (SUTURE) ×1 IMPLANT
GLOVE BIOGEL M 8.0 STRL (GLOVE) ×2 IMPLANT
GOWN SPEC L4 XLG W/TWL (GOWN DISPOSABLE) ×2 IMPLANT
GOWN STRL REUS W/TWL XL LVL3 (GOWN DISPOSABLE) ×6 IMPLANT
KIT BASIN OR (CUSTOM PROCEDURE TRAY) ×2 IMPLANT
KIT TURNOVER KIT A (KITS) IMPLANT
MARKER SKIN DUAL TIP RULER LAB (MISCELLANEOUS) ×2 IMPLANT
MESH 3DMAX 4X6 LT LRG (Mesh General) ×2 IMPLANT
MESH 3DMAX 4X6 RT LRG (Mesh General) ×2 IMPLANT
PAD POSITIONING PINK XL (MISCELLANEOUS) IMPLANT
PROTECTOR NERVE ULNAR (MISCELLANEOUS) IMPLANT
SCISSORS LAP 5X35 DISP (ENDOMECHANICALS) ×2 IMPLANT
SET IRRIG TUBING LAPAROSCOPIC (IRRIGATION / IRRIGATOR) ×2 IMPLANT
SET TUBE SMOKE EVAC HIGH FLOW (TUBING) ×2 IMPLANT
SLEEVE ADV FIXATION 5X100MM (TROCAR) ×2 IMPLANT
SOL ANTI FOG 6CC (MISCELLANEOUS) ×1 IMPLANT
SOLUTION ANTI FOG 6CC (MISCELLANEOUS) ×1
STRIP CLOSURE SKIN 1/2X4 (GAUZE/BANDAGES/DRESSINGS) IMPLANT
SUT VIC AB 4-0 SH 18 (SUTURE) ×2 IMPLANT
TACKER 5MM HERNIA 3.5CML NAB (ENDOMECHANICALS) ×2 IMPLANT
TAPE CLOTH 4X10 WHT NS (GAUZE/BANDAGES/DRESSINGS) IMPLANT
TOWEL OR 17X26 10 PK STRL BLUE (TOWEL DISPOSABLE) ×2 IMPLANT
TOWEL OR NON WOVEN STRL DISP B (DISPOSABLE) ×2 IMPLANT
TRAY FOLEY MTR SLVR 16FR STAT (SET/KITS/TRAYS/PACK) ×2 IMPLANT
TRAY LAPAROSCOPIC (CUSTOM PROCEDURE TRAY) ×2 IMPLANT
TROCAR ADV FIXATION 5X100MM (TROCAR) ×2 IMPLANT
TROCAR BLADELESS OPT 5 100 (ENDOMECHANICALS) ×2 IMPLANT

## 2018-10-18 NOTE — Transfer of Care (Signed)
Immediate Anesthesia Transfer of Care Note  Patient: Juan Torres  Procedure(s) Performed: LAPAROSCOPIC BILATERAL INGUINAL HERNIA REPAIR (Bilateral Abdomen)  Patient Location: PACU  Anesthesia Type:General  Level of Consciousness: awake and patient cooperative  Airway & Oxygen Therapy: Patient Spontanous Breathing and Patient connected to face mask  Post-op Assessment: Report given to RN and Post -op Vital signs reviewed and stable  Post vital signs: Reviewed and stable  Last Vitals:  Vitals Value Taken Time  BP 105/68 10/18/18 1019  Temp    Pulse 86 10/18/18 1020  Resp 24 10/18/18 1020  SpO2 95 % 10/18/18 1020  Vitals shown include unvalidated device data.  Last Pain:  Vitals:   10/18/18 0621  TempSrc:   PainSc: 0-No pain         Complications: No apparent anesthesia complications

## 2018-10-18 NOTE — H&P (Signed)
Chief Complaint:  Bilateral inguinal herniae-right more symptomatic  History of Present Illness:  Juan Torres is an 73 y.o. male who was seen recently in the office with an acute episode of right inguinal incarceration that resolved.  Ultrasound demonstrated fat in the inguinal region.  Consent obtained about lapararoscopic and open bilateral inguinal hernia--he has a smaller less symptomatic hernia on the left side  Past Medical History:  Diagnosis Date  . Adenomatous colon polyp   . GERD (gastroesophageal reflux disease)   . History of basal cell cancer 2012  . Hyperlipidemia   . Hypertension   . Prediabetes   . Prostate cancer ([place]) DX 12/19/11   bx=Adenocarcinoma,gleason=3+3=6,volume=42cc,PSA=4.63  . Vitamin D deficiency     Past Surgical History:  Procedure Laterality Date  . COLONOSCOPY W/ POLYPECTOMY     Adenomatous  . CYSTOSCOPY N/A 04/04/2012   Procedure: CYSTOSCOPY;  Surgeon: Molli Hazard, MD;  Location: [facility];  Service: Urology;  Laterality: N/A;  . EYE SURGERY     bilateral cataracts   . PROSTATE BIOPSY  12/19/2011   Procedure: BIOPSY TRANSRECTAL ULTRASONIC PROSTATE (TUBP);  Surgeon: Molli Hazard, MD;  Location: [facility];  Service: Urology;  Laterality: N/A;    . RADIOACTIVE SEED IMPLANT N/A 04/04/2012   Procedure: RADIOACTIVE SEED IMPLANT;  Surgeon: Molli Hazard, MD;  Location: [facility];  Service: Urology;  Laterality: N/A;  Seeds Implanted     72  Seeds Found in Bladder     None     Current Facility-Administered Medications  Medication Dose Route Frequency Provider Last Rate Last Dose  . bupivacaine liposome (EXPAREL) 1.3 % injection 266 mg  20 mL Infiltration Once Johnathan Hausen, MD      . ceFAZolin (ANCEF) IVPB 2g/100 mL premix  2 g Intravenous On Call to OR Johnathan Hausen, MD      . Chlorhexidine Gluconate Cloth 2 % PADS 6 each  6 each Topical Once Johnathan Hausen, MD       And   . Chlorhexidine Gluconate Cloth 2 % PADS 6 each  6 each Topical Once Johnathan Hausen, MD      . lactated ringers infusion   Intravenous Continuous Audry Pili, MD 50 mL/hr at 10/18/18 [phone]     Lipitor [atorvastatin], Niacin and related, and Penicillins Family History  Problem Relation Age of Onset  . Breast cancer Mother   . Cancer Maternal Grandfather        colon  . Heart attack Father 23  . Dementia Father   . Prostate cancer Maternal Uncle        prostate  . Prostate cancer Cousin        prostate cancer 1st maternal   . Ulcers Maternal Grandmother    Social History:   reports that he has been smoking cigarettes. He has a 30.00 pack-year smoking history. He has never used smokeless tobacco. He reports current alcohol use of about 36.0 Torres drinks of alcohol per week. He reports current drug use. Frequency: 6.00 times per week. Drug: Marijuana.   REVIEW OF SYSTEMS : Negative except for see problem list  Physical Exam:   Blood pressure (!) 169/92, pulse 78, temperature 98.8 F (37.1 C), temperature source Oral, resp. rate 18, height 6' (1.829 m), weight 77.1 kg, SpO2 99 %. Body mass index is 23.06 kg/m.  Gen:  WDWN WM NAD  Neurological: Alert and oriented to person, place, and time. Motor and sensory function  is grossly intact  Head: Normocephalic and atraumatic.  Eyes: Conjunctivae are normal. Pupils are equal, round, and reactive to light. No scleral icterus.  Neck: Normal range of motion. Neck supple. No tracheal deviation or thyromegaly present.  Cardiovascular:  SR without murmurs or gallops.  No carotid bruits Breast:  Not examined Respiratory: Effort normal.  No respiratory distress. No chest wall tenderness. Breath sounds normal.  No wheezes, rales or rhonchi.  Abdomen:  nontender GU:  No incarcerated hernia at present Musculoskeletal: Normal range of motion. Extremities are nontender. No cyanosis, edema or clubbing noted Lymphadenopathy: No cervical,  preauricular, postauricular or axillary adenopathy is present Skin: Skin is warm and dry. No rash noted. No diaphoresis. No erythema. No pallor. Pscyh: Normal mood and affect. Behavior is normal. Judgment and thought content normal.   LABORATORY RESULTS: Results for orders placed or performed during the hospital encounter of 10/17/18 (from the past 48 hour(s))  Basic metabolic panel     Status: Abnormal   Collection Time: 10/17/18  2:03 PM  Result Value Ref Range   Sodium 140 135 - 145 mmol/L   Potassium 4.2 3.5 - 5.1 mmol/L   Chloride 105 98 - 111 mmol/L   CO2 21 (L) 22 - 32 mmol/L   Glucose, Bld 88 70 - 99 mg/dL   BUN 13 8 - 23 mg/dL   Creatinine, Ser 1.12 0.61 - 1.24 mg/dL   Calcium 9.9 8.9 - 10.3 mg/dL   GFR calc non Af Amer >60 >60 mL/min   GFR calc Af Amer >60 >60 mL/min   Anion gap 14 5 - 15    Comment: Performed at [facility], [place] [address], [place], [place] [address]  CBC     Status: Abnormal   Collection Time: 10/17/18  2:03 PM  Result Value Ref Range   WBC 5.1 4.0 - 10.5 K/uL   RBC 5.06 4.22 - 5.81 MIL/uL   Hemoglobin 17.5 (H) 13.0 - 17.0 g/dL   HCT 49.9 39.0 - 52.0 %   MCV 98.6 80.0 - 100.0 fL   MCH 34.6 (H) 26.0 - 34.0 pg   MCHC 35.1 30.0 - 36.0 g/dL   RDW 12.8 11.5 - 15.5 %   Platelets 182 150 - 400 K/uL   nRBC 0.0 0.0 - 0.2 %    Comment: Performed at [facility], [place] [address] [place], [place] [address]  Hemoglobin A1c     Status: None   Collection Time: 10/17/18  2:06 PM  Result Value Ref Range   Hgb A1c MFr Bld 5.4 4.8 - 5.6 %    Comment: (NOTE)         Prediabetes: 5.7 - 6.4         Diabetes: >6.4         Glycemic control for adults with diabetes: <7.0    Mean Plasma Glucose 108 mg/dL    Comment: (NOTE) Performed At: [facility] [address] [place], [place] [id number] Rush Farmer MD [id number]      RADIOLOGY RESULTS: No results found.  Problem List: Patient Active Problem List    Diagnosis Date Noted  . Right inguinal hernia 09/19/2018  . Thoracic aorta atherosclerosis ([place]) 09/12/2018  . Emphysema lung ([place]) 09/12/2018  . Abnormal glucose 06/14/2018  . CKD (chronic kidney disease) stage 3, GFR 30-59 ml/min (HCC) 11/21/2016  . Continuous tobacco abuse 11/18/2016  . BMI 23.0-23.9, adult 12/17/2014  . Medication management 05/01/2013  . GERD (gastroesophageal reflux disease)   .  Hyperlipidemia, mixed   . Essential hypertension   . Vitamin D deficiency   . Prostate cancer ([place]) 12/19/2011    Assessment & Plan: Bilateral inguinal hernia-- R more symptomatic    Matt B. Hassell Done, MD, [facility] [phone] beeper [phone]  10/18/2018 7:00 AM

## 2018-10-18 NOTE — Discharge Instructions (Signed)
Laparoscopic Inguinal Hernia Repair, Adult, Care After This sheet gives you information about how to care for yourself after your procedure. Your health care provider may also give you more specific instructions. If you have problems or questions, contact your health care provider. What can I expect after the procedure? After the procedure, it is common to have:  Pain.  Swelling and bruising around the incision area.  Scrotal swelling, in men.  Some fluid or blood draining from your incisions. Follow these instructions at home: Incision care  Follow instructions from your health care provider about how to take care of your incisions. Make sure you: ? Wash your hands with soap and water before you change your bandage (dressing). If soap and water are not available, use hand sanitizer. ? Change your dressing as told by your health care provider. ? Leave stitches (sutures), skin glue, or adhesive strips in place. These skin closures may need to stay in place for 2 weeks or longer. If adhesive strip edges start to loosen and curl up, you may trim the loose edges. Do not remove adhesive strips completely unless your health care provider tells you to do that.  Check your incision area every day for signs of infection. Check for: ? More redness, swelling, or pain. ? More fluid or blood. ? Warmth. ? Pus or a bad smell.  Wear loose, soft clothing while your incisions heal. Driving  Do not drive or use heavy machinery while taking prescription pain medicine.  Do not drive for 24 hours if you were given a medicine to help you relax (sedative) during your procedure. Activity  Do not lift anything that is heavier than 10 lb (4.5 kg), or the limit that you are told, until your health care provider says that it is safe.  Ask your health care provider what activities are safe for you.A lot of activity during the first week after surgery can increase pain and swelling. For 1 week after your  procedure: ? Avoid activities that take a lot of effort, such as exercise or sports. ? You may walk and climb stairs as needed for daily activity, but avoid long walks or climbing stairs for exercise. Managing pain and swelling   Put ice on painful or swollen areas: ? Put ice in a plastic bag. ? Place a towel between your skin and the bag. ? Leave the ice on for 20 minutes, 2-3 times a day. General instructions  Do not take baths, swim, or use a hot tub until your health care provider approves. Ask your health care provider if you may take showers. You may only be allowed to take sponge baths.  Take over-the-counter and prescription medicines only as told by your health care provider.  To prevent or treat constipation while you are taking prescription pain medicine, your health care provider may recommend that you: ? Drink enough fluid to keep your urine pale yellow. ? Take over-the-counter or prescription medicines. ? Eat foods that are high in fiber, such as fresh fruits and vegetables, whole grains, and beans. ? Limit foods that are high in fat and processed sugars, such as fried and sweet foods.  Do not use any products that contain nicotine or tobacco, such as cigarettes and e-cigarettes. If you need help quitting, ask your health care provider.  Drink enough fluid to keep your urine pale yellow.  Keep all follow-up visits as told by your health care provider. This is important. Contact a health care provider if:  You  have more redness, swelling, or pain around your incisions or your groin area.  You have more swelling in your scrotum.  You have more fluid or blood coming from your incisions.  Your incisions feel warm to the touch.  You have severe pain and medicines do not help.  You have abdominal pain or swelling.  You cannot eat or drink without vomiting.  You cannot urinate or pass a bowel movement.  You faint.  You feel dizzy.  You have nausea and  vomiting.  You have a fever. Get help right away if:  You have pus or a bad smell coming from your incisions.  You have chest pain.  You have problems breathing. Summary  Pain, swelling, and bruising are common after the procedure.  Check your incision area every day for signs of infection, such as more redness, swelling, or pain.  Put ice on painful or swollen areas for 20 minutes, 2-3 times a day. This information is not intended to replace advice given to you by your health care provider. Make sure you discuss any questions you have with your health care provider. Document Released: 05/05/2016 Document Revised: 01/27/2017 Document Reviewed: 05/05/2016 Elsevier Patient Education  2020 [employer].

## 2018-10-18 NOTE — Op Note (Signed)
Akili Andujo Stahnke  12-10-1945 9 Sept 2020    PCP:  Unk Pinto, MD   Surgeon: Kaylyn Lim, MD, FACS  Asst:  none  Anes:  general  Preop Dx: RIH and smaller LIH Postop Dx: Right and left indirect inguinal herniae  Procedure: Laparoscopic TEP bilateral hernia repair with large 3 D Max mesh Location Surgery: [facility] 4 Complications: None noted  EBL:   minimal cc  Drains: none  Description of Procedure:  The patient was taken to OR 4 .  After anesthesia was administered and the patient was prepped  with Chloroprep and a timeout was performed.  The transverse incision was made below the umbilicus and slightly to the right.  The anterior sheath was incised and the rectus muscle retracted laterally.  The balloon dissection device was inserted past down to the pubis and blown up with these 10 mm 0 degrees scope in place observing this.  Following insufflation to 5 mm ports were placed in the anterior under laparoscopic visualization and then the ankle scope changed out.  Dissection began on the right side.  The patient had previous seeds and whether that plate had a lot of inflammatory changes in the fat in the area.  The dissection The inferior epigastric vessels superior to the dissection and identified the cord structures and would have been noted on ultrasound being that the right inguinal region was in fact a very large fat mass going lateral to the cord structures.  This was identified and brought out and placed in the preperitoneal space.  Cord was examined for any further sacs.  Dissection proceeded on the left side again similarly a sac which was opened.  This did create a pneumoperitoneum so I placed another 5 mm using the Optiview slightly right to the right and above the umbilicus for as a minute and then finally to look at the repair.  The rent in the peritoneum on the left side was controlled with a Endoloop.  3D max mesh was brought in on the right side first and tacked to the midline  and laid out laterally.  Similarly was brought on the left side for the left side and tacked medially and brought out laterally and secured.  All tacks were placed with the neck to be palpated.  Port sites were injected with Exparel.  The preperitoneal space was allowed to to decompress and we visualized repair as well as the Endoloop and the peritoneum  from the peritoneal side.  The patient tolerated the procedure well and was taken to the PACU in stable condition.     Matt B. Hassell Done, [place], [facility], [place]

## 2018-10-18 NOTE — Interval H&P Note (Signed)
History and Physical Interval Note:  10/18/2018 7:05 AM  [name]  has presented today for surgery, with the diagnosis of BILATERAL INGUINAL HERNIA.  The various methods of treatment have been discussed with the patient and family. After consideration of risks, benefits and other options for treatment, the patient has consented to  Procedure(s): [place] (Bilateral) as a surgical intervention.  The patient's history has been reviewed, patient examined, no change in status, stable for surgery.  I have reviewed the patient's chart and labs.  Questions were answered to the patient's satisfaction.     Juan Torres

## 2018-10-18 NOTE — Interval H&P Note (Signed)
History and Physical Interval Note:  10/18/2018 7:04 AM  Juan Torres  has presented today for surgery, with the diagnosis of BILATERAL INGUINAL HERNIA.  The various methods of treatment have been discussed with the patient and family. After consideration of risks, benefits and other options for treatment, the patient has consented to  Procedure(s): [place] (Bilateral) as a surgical intervention.  The patient's history has been reviewed, patient examined, no change in status, stable for surgery.  I have reviewed the patient's chart and labs.  Questions were answered to the patient's satisfaction.     Pedro Earls

## 2018-10-18 NOTE — Anesthesia Procedure Notes (Signed)
Procedure Name: Intubation Date/Time: 10/18/2018 7:39 AM Performed by: Claudia Desanctis, CRNA Pre-anesthesia Checklist: Patient identified, Emergency Drugs available, Suction available and Patient being monitored Patient Re-evaluated:Patient Re-evaluated prior to induction Oxygen Delivery Method: Circle system utilized Preoxygenation: Pre-oxygenation with 100% oxygen Induction Type: IV induction Ventilation: Mask ventilation without difficulty Laryngoscope Size: 2 and Miller Grade View: Grade I Tube type: Oral Number of attempts: 1 Airway Equipment and Method: Stylet Placement Confirmation: ETT inserted through vocal cords under direct vision,  positive ETCO2 and breath sounds checked- equal and bilateral Secured at: 22 cm Tube secured with: Tape Dental Injury: Teeth and Oropharynx as per pre-operative assessment

## 2018-10-19 ENCOUNTER — Encounter (HOSPITAL_COMMUNITY): Payer: Self-pay | Admitting: Surgery

## 2018-10-19 NOTE — Anesthesia Postprocedure Evaluation (Signed)
Anesthesia Post Note  Patient: Juan Torres  Procedure(s) Performed: LAPAROSCOPIC BILATERAL INGUINAL HERNIA REPAIR (Bilateral Abdomen)     Patient location during evaluation: PACU Anesthesia Type: General Level of consciousness: awake and alert Pain management: pain level controlled Vital Signs Assessment: post-procedure vital signs reviewed and stable Respiratory status: spontaneous breathing, nonlabored ventilation, respiratory function stable and patient connected to nasal cannula oxygen Cardiovascular status: blood pressure returned to baseline and stable Postop Assessment: no apparent nausea or vomiting Anesthetic complications: no    Last Vitals:  Vitals:   10/18/18 1115 10/18/18 1126  BP: (!) 152/90 110/67  Pulse: 76 80  Resp: (!) 21 18  Temp: (!) 36.4 C (!) 36.3 C  SpO2: 94% 94%    Last Pain:  Vitals:   10/18/18 1126  TempSrc:   PainSc: 2                  Chelsey L Woodrum

## 2018-10-24 ENCOUNTER — Ambulatory Visit (INDEPENDENT_AMBULATORY_CARE_PROVIDER_SITE_OTHER): Payer: Medicare HMO | Admitting: Neurology

## 2018-10-24 DIAGNOSIS — I158 Other secondary hypertension: Secondary | ICD-10-CM

## 2018-10-24 DIAGNOSIS — L820 Inflamed seborrheic keratosis: Secondary | ICD-10-CM | POA: Diagnosis not present

## 2018-10-24 DIAGNOSIS — D1801 Hemangioma of skin and subcutaneous tissue: Secondary | ICD-10-CM | POA: Diagnosis not present

## 2018-10-24 DIAGNOSIS — L821 Other seborrheic keratosis: Secondary | ICD-10-CM | POA: Diagnosis not present

## 2018-10-24 DIAGNOSIS — C44629 Squamous cell carcinoma of skin of left upper limb, including shoulder: Secondary | ICD-10-CM | POA: Diagnosis not present

## 2018-10-24 DIAGNOSIS — G4710 Hypersomnia, unspecified: Secondary | ICD-10-CM

## 2018-10-24 DIAGNOSIS — D225 Melanocytic nevi of trunk: Secondary | ICD-10-CM | POA: Diagnosis not present

## 2018-10-24 DIAGNOSIS — R0683 Snoring: Secondary | ICD-10-CM

## 2018-10-24 DIAGNOSIS — K219 Gastro-esophageal reflux disease without esophagitis: Secondary | ICD-10-CM

## 2018-10-24 DIAGNOSIS — Z85828 Personal history of other malignant neoplasm of skin: Secondary | ICD-10-CM | POA: Diagnosis not present

## 2018-10-24 DIAGNOSIS — G4733 Obstructive sleep apnea (adult) (pediatric): Secondary | ICD-10-CM | POA: Diagnosis not present

## 2018-10-24 DIAGNOSIS — L570 Actinic keratosis: Secondary | ICD-10-CM | POA: Diagnosis not present

## 2018-10-24 DIAGNOSIS — Z720 Tobacco use: Secondary | ICD-10-CM

## 2018-10-24 DIAGNOSIS — L814 Other melanin hyperpigmentation: Secondary | ICD-10-CM | POA: Diagnosis not present

## 2018-10-24 DIAGNOSIS — F1010 Alcohol abuse, uncomplicated: Secondary | ICD-10-CM

## 2018-10-24 DIAGNOSIS — D692 Other nonthrombocytopenic purpura: Secondary | ICD-10-CM | POA: Diagnosis not present

## 2018-10-24 DIAGNOSIS — C44722 Squamous cell carcinoma of skin of right lower limb, including hip: Secondary | ICD-10-CM | POA: Diagnosis not present

## 2018-10-24 DIAGNOSIS — D485 Neoplasm of uncertain behavior of skin: Secondary | ICD-10-CM | POA: Diagnosis not present

## 2018-11-05 DIAGNOSIS — G4733 Obstructive sleep apnea (adult) (pediatric): Secondary | ICD-10-CM | POA: Insufficient documentation

## 2018-11-05 DIAGNOSIS — F1010 Alcohol abuse, uncomplicated: Secondary | ICD-10-CM | POA: Insufficient documentation

## 2018-11-05 DIAGNOSIS — R0683 Snoring: Secondary | ICD-10-CM | POA: Insufficient documentation

## 2018-11-05 DIAGNOSIS — I158 Other secondary hypertension: Secondary | ICD-10-CM | POA: Insufficient documentation

## 2018-11-05 DIAGNOSIS — G4710 Hypersomnia, unspecified: Secondary | ICD-10-CM | POA: Insufficient documentation

## 2018-11-05 NOTE — Addendum Note (Signed)
Addended by: Larey Seat on: 11/05/2018 05:28 PM   Modules accepted: Orders

## 2018-11-05 NOTE — Procedures (Signed)
  Patient Information     First Name: Juan Last Name: Torres ID: [id number]  Birth Date: 03/22/1945 Age: 73 Gender: Male  Referring Provider: Unk Pinto, MD BMI: 22.7 (W=167 lb, H=6' 0'')  Neck Circ.:  15 '' Epworth:  2/24   Sleep Study Information    Study Date: Sep 16-17, 2020 S/H/A Version: 001.001.001.001 / 4.1.1528 / 77  History:    [name] is a 73 year old right -handed Caucasian male with a medical history of Adenomatous colon polyp, GERD (gastroesophageal reflux disease), History of basal cell cancer (2012), Hyperlipidemia, Hypertension, Prediabetes, Prostate cancer ([place]) (DX 12/19/11), tobacco use, alcohol overuse, shift work history, and Vitamin D deficiency. He presents with a chief complaint of snoring and witnessed apnea in the setting of tobacco and alcohol use.              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 Tachy-brady cardia was noted. No significant hypoxemia, moderate snoring, and strong supine sleep dependent apnea.   Recommendations:     I recommend to avoid supine sleep and smoking cessation as well as to reduce alcohol intake to not more than one drink per day.  This should reduce snoring and apnea significantly. I realize that this may be difficult. The alternative is use of a CPAP, an autotitration device with pressures between 5 and 15 cm water, with 2 cm EPR, and mask of choice, with heated humidification.   Electronically Signed: Larey Seat, MD   11-03-2018            Sleep Summary  Oxygen Saturation Statistics   Start Study Time: End Study Time: Total Recording Time:  9:18:52 PM 3:38:34 AM    6 h, 19 min  Total Sleep Time % REM of Sleep Time:  5 h, 24 min  31.0    Mean: 93 Minimum: 87 Maximum: 97  Mean of Desaturations Nadirs (%):   91  Oxygen Desaturation. %: 4-9 10-20 >20 Total  Events Number Total  37 100.0  0 0.0  0 0.0  37  100.0  Oxygen Saturation: <90 <=88 <85 <80 <70  Duration (minutes): Sleep % 0.7 0.2 0.2 0.1 0.0 0.0 0.0 0.0 0.0 0.0     Respiratory Indices      Total Events REM NREM All Night  pRDI:  73  pAHI:  65 ODI:  37  pAHIc: 6   %  CSR: 0.0 19.3 18.1 13.3 1.8 11.1 9.4 4.0 0.8 13.6 12.1 6.9 1.1       Pulse Rate Statistics during Sleep (BPM)      Mean: 68 Minimum: 48 Maximum: 116    Indices are calculated using technically valid sleep time of 5 h, 22 min. pRDI/pAHI are calculated using o2 desaturations ? 3%  Body Position Statistics  Position Supine Prone Right Left Non-Supine  Sleep (min) 136.7 85.5 61.5 41.0 188.0  Sleep % 42.1 26.3 18.9 12.6 57.9  pRDI 23.1 0.0 8.8 17.7 6.7  pAHI 20.0 0.0 8.8 16.3 6.4  ODI 16.0 0.0 0.0 1.5 0.3     Snoring Statistics Snoring Level (dB) >40 >50 >60 >70 >80 >Threshold (45)  Sleep (min) 79.7 2.5 0.8 0.0 0.0 9.3  Sleep % 24.5 0.8 0.2 0.0 0.0 2.9    Mean: 41 dB Sleep Stages Chart

## 2018-11-07 ENCOUNTER — Telehealth: Payer: Self-pay | Admitting: Neurology

## 2018-11-07 NOTE — Telephone Encounter (Signed)
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 Tachy-brady cardia was noted.  No significant hypoxemia, moderate snoring, and strong supine  sleep dependent apnea.   Recommendations:    I recommend to avoid supine sleep and smoking cessation as well  as to reduce alcohol intake to not more than one drink per day.  This should reduce snoring and apnea significantly. I realize  that this may be difficult.  The alternative is use of a CPAP, an autotitration device with  pressures between 5 and 15 cm water, with 2 cm EPR, and mask of  choice, with heated humidification.   Electronically Signed: Larey Seat, MD  11-03-2018

## 2018-11-07 NOTE — Telephone Encounter (Signed)
Called patient to discuss sleep study results. No answer at this time. LVM for the patient to call back.   

## 2018-11-09 NOTE — Telephone Encounter (Signed)
Pt returned call. Please call back when available. 

## 2018-11-12 NOTE — Telephone Encounter (Signed)
I called pt. I advised pt that Dr. Brett Fairy reviewed their sleep study results and found that pt has mild sleep apnea. Dr. Brett Fairy recommends that pt start auto CPAP. I reviewed PAP compliance expectations with the pt. Pt is agreeable to starting a CPAP. I advised pt that an order will be sent to a DME, [facility], and [facility] will call the pt within about one week after they file with the pt's insurance. [facility] will show the pt how to use the machine, fit for masks, and troubleshoot the CPAP if needed. A follow up appt was made for insurance purposes with Ward Givens, NP on Dec 10,2020 at 10 am. Pt verbalized understanding to arrive 15 minutes early and bring their CPAP. A letter with all of this information in it will be mailed to the pt as a reminder. I verified with the pt that the address we have on file is correct. Pt verbalized understanding of results. Pt had no questions at this time but was encouraged to call back if questions arise. I have sent the order to [facility] and have received confirmation that they have received the order.

## 2018-11-22 ENCOUNTER — Other Ambulatory Visit: Payer: Self-pay | Admitting: Internal Medicine

## 2018-11-22 DIAGNOSIS — G4733 Obstructive sleep apnea (adult) (pediatric): Secondary | ICD-10-CM | POA: Diagnosis not present

## 2018-11-22 DIAGNOSIS — I10 Essential (primary) hypertension: Secondary | ICD-10-CM

## 2018-11-23 ENCOUNTER — Ambulatory Visit: Payer: Self-pay | Admitting: Adult Health

## 2018-11-29 ENCOUNTER — Other Ambulatory Visit: Payer: Self-pay | Admitting: Internal Medicine

## 2018-11-29 DIAGNOSIS — E782 Mixed hyperlipidemia: Secondary | ICD-10-CM

## 2018-11-29 MED FILL — ROSUVASTATIN CALCIUM 40 MG: 40 | 90 days supply | Qty: 90 | Fill #0

## 2018-12-10 ENCOUNTER — Ambulatory Visit
Admission: RE | Admit: 2018-12-10 | Discharge: 2018-12-10 | Disposition: A | Discharge status: Home / Self Care | Payer: Self-pay | Source: Ambulatory Visit | Attending: Adult Health | Admitting: Adult Health

## 2018-12-10 ENCOUNTER — Encounter: Payer: Self-pay | Admitting: Adult Health

## 2018-12-10 DIAGNOSIS — R918 Other nonspecific abnormal finding of lung field: Secondary | ICD-10-CM

## 2018-12-10 DIAGNOSIS — Z122 Encounter for screening for malignant neoplasm of respiratory organs: Secondary | ICD-10-CM

## 2018-12-10 HISTORY — DX: Other nonspecific abnormal finding of lung field: R91.8

## 2018-12-11 MED FILL — FENOFIBRATE 134 MG CAPSULE: 134 | 90 days supply | Qty: 90 | Fill #1

## 2018-12-17 ENCOUNTER — Ambulatory Visit: Payer: Self-pay | Admitting: Internal Medicine

## 2018-12-23 DIAGNOSIS — G4733 Obstructive sleep apnea (adult) (pediatric): Secondary | ICD-10-CM | POA: Diagnosis not present

## 2018-12-30 ENCOUNTER — Encounter: Payer: Self-pay | Admitting: Internal Medicine

## 2018-12-30 NOTE — Progress Notes (Signed)
       N  O      S  H  O  W                                                                                                                                                                                                    This very nice 73 y.o. MWM presents for 3 month follow up with HTN, HLD, Pre-Diabetes and Vitamin D Deficiency. Patient has GERD controlled on his meds.  Patient was tx'd by Brachytherapy (2014)  for [place] Prostate       Patient is treated for HTN (1998)  & BP has been controlled at home. Today's  . Patient has CKD3 & is followecdby Dr Justin Mend.  Patient has had no complaints of any cardiac type chest pain, palpitations, dyspnea / orthopnea / PND, dizziness, claudication, or dependent edema.  Hyperlipidemia is controlled with diet & irregular compliance with his chol meds. Patient denies myalgias or other med SE's. Last Lipids were not at goal:  Lab Results  Component Value Date   CHOL 247 (H) 09/13/2018   HDL 69 09/13/2018   LDLCALC 145 (H) 09/13/2018   TRIG 189 (H) 09/13/2018   CHOLHDL 3.6 09/13/2018    Also, the patient has history of PreDiabetes (A1c 5.7% / 2014) and has had no symptoms of reactive hypoglycemia, diabetic polys, paresthesias or visual blurring.  Last A1c was Normal & at goal:  Lab Results  Component Value Date   HGBA1C 5.4 10/17/2018       Further, the patient also has history of Vitamin D Deficiency("13" / 2013)  and supplements vitamin D without any suspected side-effects. Last vitamin D was still low:  Lab Results  Component Value Date   VD25OH 41 06/12/2018

## 2018-12-31 ENCOUNTER — Ambulatory Visit: Payer: Self-pay | Admitting: Internal Medicine

## 2019-01-10 ENCOUNTER — Encounter: Payer: Self-pay | Admitting: Internal Medicine

## 2019-01-10 NOTE — Progress Notes (Signed)
History of Present Illness:      This very nice 73 y.o. MWM presents for 3 month follow up with HTN, HLD, Pre-Diabetes and Vitamin D Deficiency. Patient has GERD controlled on his meds.       Patient is treated for HTN (1998)  & BP has been controlled at home. Today's BP is at goal - 138/76. Patient has HT associated CKD3. Patient has had no complaints of any cardiac type chest pain, palpitations, dyspnea / orthopnea / PND, dizziness, claudication, or dependent edema.      Hyperlipidemia is not controlled with diet & sporadic meds compliance. Patient denies myalgias or other med SE's. Last Lipids were not at goal:  Lab Results  Component Value Date   CHOL 247 (H) 09/13/2018   HDL 69 09/13/2018   LDLCALC 145 (H) 09/13/2018   TRIG 189 (H) 09/13/2018   CHOLHDL 3.6 09/13/2018        Also, the patient has history of PreDiabetes(A1c 5.7% / 2014) and has had no symptoms of reactive hypoglycemia, diabetic polys, paresthesias or visual blurring.  Last A1c was Normal & at goal:  Lab Results  Component Value Date   HGBA1C 5.4 10/17/2018        Further, the patient also has history of Vitamin D Deficiency ("13" / 2013) and supplements vitamin D without any suspected side-effects. Last vitamin D was still low (goal 70-100):  Lab Results  Component Value Date   VD25OH 41 06/12/2018    Current Outpatient Medications on File Prior to Visit  Medication Sig  . amLODipine (NORVASC) 5 MG tablet TAKE 1 TABLET BY MOUTH DAILY.  Marland Kitchen aspirin 81 MG tablet Take 81 mg by mouth every other day.   . B Complex Vitamins (VITAMIN B-COMPLEX PO) Take 1 tablet by mouth daily.  . Cholecalciferol (VITAMIN D3) 125 MCG (5000 UT) TABS Take 15,000 Units by mouth every morning.  Marland Kitchen esomeprazole (NEXIUM) 20 MG capsule Take 1 capsule daily for Heartburn (Patient taking differently: Take 20 mg by mouth every evening. )  . fenofibrate 160 MG tablet Take 1 tablet (160 mg total) by mouth daily.  . fenofibrate  micronized (LOFIBRA) 134 MG capsule TAKE 1 CAPSULE BY MOUTH EVERY DAY (Patient taking differently: Take 134 mg by mouth at bedtime. )  . HYDROcodone-acetaminophen (NORCO/VICODIN) 5-325 MG tablet Take 1 tablet by mouth every 6 (six) hours as needed for moderate pain.  . Magnesium 400 MG CAPS Take 400 mg by mouth every evening.   . Omega-3 Fatty Acids (FISH OIL) 1000 MG CAPS Take 1,000 mg by mouth every morning.  . rosuvastatin (CRESTOR) 40 MG tablet Take 1 tablet Daily for Cholesterol  . vitamin E 1000 UNIT capsule Take 3,000 Units by mouth every morning.    No current facility-administered medications on file prior to visit.     Allergies  Allergen Reactions  . Lipitor [Atorvastatin] Other (See Comments)    fatigued  . Niacin And Related Other (See Comments)    Flushing  . Penicillins Rash    PMHx:   Past Medical History:  Diagnosis Date  . Adenomatous colon polyp   . GERD (gastroesophageal reflux disease)   . History of basal cell cancer 2012  . Hyperlipidemia   . Hypertension   . Prediabetes   . Prostate cancer ([place]) DX 12/19/11   bx=Adenocarcinoma,gleason=3+3=6,volume=42cc,PSA=4.63  . Pulmonary nodules 12/10/2018   Numerous unchanged/benign appearing per CT lung 12/2018  . Vitamin D deficiency    Immunization  History  Administered Date(s) Administered  . DT 10/20/2011  . Pneumococcal Conjugate-13 11/14/2017  . Pneumococcal Polysaccharide-23 04/28/2016  . Zoster 10/20/2011   Past Surgical History:  Procedure Laterality Date  . COLONOSCOPY W/ POLYPECTOMY     Adenomatous  . CYSTOSCOPY N/A 04/04/2012   Procedure: CYSTOSCOPY;  Surgeon: Molli Hazard, MD;  Location: [facility]  Service: Urology;  Laterality: N/A;  . EYE SURGERY     bilateral cataracts   . INGUINAL HERNIA REPAIR Bilateral 10/18/2018   Procedure: LAPAROSCOPIC BILATERAL INGUINAL HERNIA REPAIR;  Surgeon: Johnathan Hausen, MD;  Location: [facility];  Service: General;  Laterality: Bilateral;   . PROSTATE BIOPSY  12/19/2011   Procedure: BIOPSY TRANSRECTAL ULTRASONIC PROSTATE (TUBP);  Surgeon: Molli Hazard, MD;  Location: [facility];  Service: Urology;  Laterality: N/A;    . RADIOACTIVE SEED IMPLANT N/A 04/04/2012   Procedure: RADIOACTIVE SEED IMPLANT;  Surgeon: Molli Hazard, MD;  Location: [facility];  Service: Urology;  Laterality: N/A;  Seeds Implanted     72  Seeds Found in Bladder     None     FHx:    Reviewed / unchanged  SHx:    Reviewed / unchanged   Systems Review:  Constitutional: Denies fever, chills, wt changes, headaches, insomnia, fatigue, night sweats, change in appetite. Eyes: Denies redness, blurred vision, diplopia, discharge, itchy, watery eyes.  ENT: Denies discharge, congestion, post nasal drip, epistaxis, sore throat, earache, hearing loss, dental pain, tinnitus, vertigo, sinus pain, snoring.  CV: Denies chest pain, palpitations, irregular heartbeat, syncope, dyspnea, diaphoresis, orthopnea, PND, claudication or edema. Respiratory: denies cough, dyspnea, DOE, pleurisy, hoarseness, laryngitis, wheezing.  Gastrointestinal: Denies dysphagia, odynophagia, heartburn, reflux, water brash, abdominal pain or cramps, nausea, vomiting, bloating, diarrhea, constipation, hematemesis, melena, hematochezia  or hemorrhoids. Genitourinary: Denies dysuria, frequency, urgency, nocturia, hesitancy, discharge, hematuria or flank pain. Musculoskeletal: Denies arthralgias, myalgias, stiffness, jt. swelling, pain, limping or strain/sprain.  Skin: Denies pruritus, rash, hives, warts, acne, eczema or change in skin lesion(s). Neuro: No weakness, tremor, incoordination, spasms, paresthesia or pain. Psychiatric: Denies confusion, memory loss or sensory loss. Endo: Denies change in weight, skin or hair change.  Heme/Lymph: No excessive bleeding, bruising or enlarged lymph nodes.  Physical Exam  BP 138/76   Pulse 81   Temp 97.9 F  (36.6 C)   Wt 175 lb (79.4 kg)   SpO2 98%   BMI 23.73 kg/m   Appears  well groomed  and in no distress. Ruddy facies.   Eyes: PERRLA, EOMs, conjunctiva no swelling or erythema. Sinuses: No frontal/maxillary tenderness ENT/Mouth: EAC's clear, TM's nl w/o erythema, bulging. Nares clear w/o erythema, swelling, exudates. Oropharynx clear without erythema or exudates. Oral hygiene is good. Tongue normal, non obstructing. Hearing intact.  Neck: Supple. Thyroid not palpable. Car 2+/2+ without bruits, nodes or JVD. Chest: Respirations nl with BS clear & equal w/o rales, rhonchi, wheezing or stridor.  Cor: Heart sounds normal w/ regular rate and rhythm without sig. murmurs, gallops, clicks or rubs. Peripheral pulses normal and equal  without edema.  Abdomen: Soft & bowel sounds normal. Non-tender w/o guarding, rebound, hernias, masses or organomegaly.  Lymphatics: Unremarkable.  Musculoskeletal: Full ROM all peripheral extremities, joint stability, 5/5 strength and normal gait.  Skin: Warm, dry without exposed rashes, lesions or ecchymosis apparent.  Neuro: Cranial nerves intact, reflexes equal bilaterally. Sensory-motor testing grossly intact. Tendon reflexes grossly intact. Has high frequency, low amplitude fine tremor of the hands with activity Pysch:  Alert & oriented x 3.  Insight and judgement nl & appropriate. No ideations.  Assessment and Plan:  1. Essential hypertension  - D/CAmlodipine as starting Propranolol for hereditary tremor   - Continue medication, monitor blood pressure at home.  - Continue DASH diet.  Reminder to go to the ER if any CP,  SOB, nausea, dizziness, severe HA, changes vision/speech.  - CBC with Diff - COMPLETE METABOLIC PANEL WITH GFR - Magnesium - TSH  2. Hyperlipidemia, mixed  - Continue diet/meds, exercise,& lifestyle modifications.  - Continue monitor periodic cholesterol/liver & renal functions   - Lipid Profile - TSH  3. Abnormal glucose  -  Continue diet, exercise  - Lifestyle modifications.  - Monitor appropriate labs.  - COMPLETE METABOLIC PANEL WITH GFR  4. Vitamin D deficiency  - Continue supplementation.  - Vitamin D (25 hydroxy)  5. Familial tremor  - propranolol ER (INDERAL LA) 120 MG 24 hr capsule; Take 1 capsule every Morning for BP & tremor  Dispense: 90 capsule; Refill: 3 - diazepam (VALIUM) 5 MG tablet; Take 1/2 to 1 tablet 3 x /day for Tremor  Dispense: 90 tablet; Refill: 2  6. Medication management  - CBC with Diff - COMPLETE METABOLIC PANEL WITH GFR - Magnesium - Lipid Profile - TSH - Vitamin D (25 hydroxy)        Discussed  regular exercise, BP monitoring, weight control to achieve/maintain BMI less than 25 and discussed med and SE's. Recommended labs to assess and monitor clinical status with further disposition pending results of labs.  I discussed the assessment and treatment plan with the patient. The patient was provided an opportunity to ask questions and all were answered. The patient agreed with the plan and demonstrated an understanding of the instructions.  I provided over 30 minutes of exam, counseling, chart review and  complex critical decision making.  Kirtland Bouchard, MD

## 2019-01-10 NOTE — Patient Instructions (Signed)
Vit D  & Vit C 1,000 mg   are recommended to help protect  against the Covid-19 and other Corona viruses.    Also it's recommended  to take  Zinc 50 mg  to help  protect against the Covid-19   and best place to get  is also on [employer]  and don't pay more than 6-8 cents /pill !   ===================================== Coronavirus (COVID-19) Are you at risk?  Are you at risk for the Coronavirus (COVID-19)?  To be considered HIGH RISK for Coronavirus (COVID-19), you have to meet the following criteria:  . Traveled to [place], [place], [place], [place] or [place]; or in the [place] to [place], [place], [place]  . or [place]; and have fever, cough, and shortness of breath within the last 2 weeks of travel OR . Been in close contact with a person diagnosed with COVID-19 within the last 2 weeks and have  . fever, cough,and shortness of breath .  . IF YOU DO NOT MEET THESE CRITERIA, YOU ARE CONSIDERED LOW RISK FOR COVID-19.  What to do if you are HIGH RISK for COVID-19?  . If you are having a medical emergency, call 911. . Seek medical care right away. Before you go to a doctor's office, urgent care or emergency department, .  call ahead and tell them about your recent travel, contact with someone diagnosed with COVID-19  .  and your symptoms.  . You should receive instructions from your physician's office regarding next steps of care.  . When you arrive at healthcare provider, tell the healthcare staff immediately you have returned from  . visiting [place], [place], [place], [place] or [place]; or traveled in the [place] to [place], [place],  . [place] or [place] in the last two weeks or you have been in close contact with a person diagnosed with  . COVID-19 in the last 2 weeks.   . Tell the health care staff about your symptoms: fever, cough and shortness of breath. . After you have been seen by a medical provider, you will be either: o Tested for  (COVID-19) and discharged home on quarantine except to seek medical care if  o symptoms worsen, and asked to  - Stay home and avoid contact with others until you get your results (4-5 days)  - Avoid travel on public transportation if possible (such as bus, train, or airplane) or o Sent to the Emergency Department by EMS for evaluation, COVID-19 testing  and  o possible admission depending on your condition and test results.  What to do if you are LOW RISK for COVID-19?  Reduce your risk of any infection by using the same precautions used for avoiding the common cold or flu:  . Wash your hands often with soap and warm water for at least 20 seconds.  If soap and water are not readily available,  . use an alcohol-based hand sanitizer with at least 60% alcohol.  . If coughing or sneezing, cover your mouth and nose by coughing or sneezing into the elbow areas of your shirt or coat, .  into a tissue or into your sleeve (not your hands). . Avoid shaking hands with others and consider head nods or verbal greetings only. . Avoid touching your eyes, nose, or mouth with unwashed hands.  . Avoid close contact with people who are sick. . Avoid places or events with large numbers of people in one location, like concerts or sporting events. .   Carefully consider travel plans you have or are making. . If you are planning any travel outside or inside the [place], visit the CDC's Travelers' Health webpage for the latest health notices. . If you have some symptoms but not all symptoms, continue to monitor at home and seek medical attention  . if your symptoms worsen. . If you are having a medical emergency, call 911.   ++++++++++++++++++++++++++++++++ Recommend Adult Low Dose Aspirin or  coated  Aspirin 81 mg daily  To reduce risk of Colon Cancer 40 %,  Skin Cancer 26 % ,  Melanoma 46%  and  Pancreatic cancer 60% ++++++++++++++++++++++++++++++++ Vitamin D goal  is between 70-100.  Please make sure that  you are taking your Vitamin D as directed.  It is very important as a natural anti-inflammatory  helping hair, skin, and nails, as well as reducing stroke and heart attack risk.  It helps your bones and helps with mood. It also decreases numerous cancer risks so please take it as directed.  Low Vit D is associated with a 200-300% higher risk for CANCER  and 200-300% higher risk for HEART   ATTACK  &  STROKE.   ...................................... It is also associated with higher death rate at younger ages,  autoimmune diseases like Rheumatoid arthritis, Lupus, Multiple Sclerosis.    Also many other serious conditions, like depression, Alzheimer's Dementia, infertility, muscle aches, fatigue, fibromyalgia - just to name a few. ++++++++++++++++++++ Recommend the book "The END of DIETING" by Dr Joel Fuhrman  & the book "The END of DIABETES " by Dr Joel Fuhrman At [employer] - get book & Audio CD's    Being diabetic has a  300% increased risk for heart attack, stroke, cancer, and alzheimer- type vascular dementia. It is very important that you work harder with diet by avoiding all foods that are white. Avoid white rice (brown & wild rice is OK), white potatoes (sweetpotatoes in moderation is OK), White bread or wheat bread or anything made out of white flour like bagels, donuts, rolls, buns, biscuits, cakes, pastries, cookies, pizza crust, and pasta (made from white flour & egg whites) - vegetarian pasta or spinach or wheat pasta is OK. Multigrain breads like Arnold's or Pepperidge Farm, or multigrain sandwich thins or flatbreads.  Diet, exercise and weight loss can reverse and cure diabetes in the early stages.  Diet, exercise and weight loss is very important in the control and prevention of complications of diabetes which affects every system in your body, ie. Brain - dementia/stroke, eyes - glaucoma/blindness, heart - heart attack/heart failure, kidneys - dialysis, stomach - gastric paralysis,  intestines - malabsorption, nerves - severe painful neuritis, circulation - gangrene & loss of a leg(s), and finally cancer and Alzheimers.    I recommend avoid fried & greasy foods,  sweets/candy, white rice (brown or wild rice or Quinoa is OK), white potatoes (sweet potatoes are OK) - anything made from white flour - bagels, doughnuts, rolls, buns, biscuits,white and wheat breads, pizza crust and traditional pasta made of white flour & egg white(vegetarian pasta or spinach or wheat pasta is OK).  Multi-grain bread is OK - like multi-grain flat bread or sandwich thins. Avoid alcohol in excess. Exercise is also important.    Eat all the vegetables you want - avoid meat, especially red meat and dairy - especially cheese.  Cheese is the most concentrated form of trans-fats which is the worst thing to clog up our arteries. Veggie cheese is OK which can be   found in the fresh produce section at [employer] or [employer] or [employer]  +++++++++++++++++++++ DASH Eating Plan  DASH stands for "Dietary Approaches to Stop Hypertension."   The DASH eating plan is a healthy eating plan that has been shown to reduce high blood pressure (hypertension). Additional health benefits may include reducing the risk of type 2 diabetes mellitus, heart disease, and stroke. The DASH eating plan may also help with weight loss. WHAT DO I NEED TO KNOW ABOUT THE DASH EATING PLAN? For the DASH eating plan, you will follow these general guidelines:  Choose foods with a percent daily value for sodium of less than 5% (as listed on the food label).  Use salt-free seasonings or herbs instead of table salt or sea salt.  Check with your health care provider or pharmacist before using salt substitutes.  Eat lower-sodium products, often labeled as "lower sodium" or "no salt added."  Eat fresh foods.  Eat more vegetables, fruits, and low-fat dairy products.  Choose whole grains. Look for the word "whole" as the first word in  the ingredient list.  Choose fish   Limit sweets, desserts, sugars, and sugary drinks.  Choose heart-healthy fats.  Eat veggie cheese   Eat more home-cooked food and less restaurant, buffet, and fast food.  Limit fried foods.  Cook foods using methods other than frying.  Limit canned vegetables. If you do use them, rinse them well to decrease the sodium.  When eating at a restaurant, ask that your food be prepared with less salt, or no salt if possible.                      WHAT FOODS CAN I EAT? Read Dr Joel Fuhrman's books on The End of Dieting & The End of Diabetes  Grains Whole grain or whole wheat bread. Brown rice. Whole grain or whole wheat pasta. Quinoa, bulgur, and whole grain cereals. Low-sodium cereals. Corn or whole wheat flour tortillas. Whole grain cornbread. Whole grain crackers. Low-sodium crackers.  Vegetables Fresh or frozen vegetables (raw, steamed, roasted, or grilled). Low-sodium or reduced-sodium tomato and vegetable juices. Low-sodium or reduced-sodium tomato sauce and paste. Low-sodium or reduced-sodium canned vegetables.   Fruits All fresh, canned (in natural juice), or frozen fruits.  Protein Products  All fish and seafood.  Dried beans, peas, or lentils. Unsalted nuts and seeds. Unsalted canned beans.  Dairy Low-fat dairy products, such as skim or 1% milk, 2% or reduced-fat cheeses, low-fat ricotta or cottage cheese, or plain low-fat yogurt. Low-sodium or reduced-sodium cheeses.  Fats and Oils Tub margarines without trans fats. Light or reduced-fat mayonnaise and salad dressings (reduced sodium). Avocado. Safflower, olive, or canola oils. Natural peanut or almond butter.  Other Unsalted popcorn and pretzels. The items listed above may not be a complete list of recommended foods or beverages. Contact your dietitian for more options.  +++++++++++++++  WHAT FOODS ARE NOT RECOMMENDED? Grains/ White flour or wheat flour White bread. White pasta.  White rice. Refined cornbread. Bagels and croissants. Crackers that contain trans fat.  Vegetables  Creamed or fried vegetables. Vegetables in a . Regular canned vegetables. Regular canned tomato sauce and paste. Regular tomato and vegetable juices.  Fruits Dried fruits. Canned fruit in light or heavy syrup. Fruit juice.  Meat and Other Protein Products Meat in general - RED meat & White meat.  Fatty cuts of meat. Ribs, chicken wings, all processed meats as bacon, sausage, bologna, salami, fatback, hot dogs, bratwurst and packaged   luncheon meats.  Dairy Whole or 2% milk, cream, half-and-half, and cream cheese. Whole-fat or sweetened yogurt. Full-fat cheeses or blue cheese. Non-dairy creamers and whipped toppings. Processed cheese, cheese spreads, or cheese curds.  Condiments Onion and garlic salt, seasoned salt, table salt, and sea salt. Canned and packaged gravies. Worcestershire sauce. Tartar sauce. Barbecue sauce. Teriyaki sauce. Soy sauce, including reduced sodium. Steak sauce. Fish sauce. Oyster sauce. Cocktail sauce. Horseradish. Ketchup and mustard. Meat flavorings and tenderizers. Bouillon cubes. Hot sauce. Tabasco sauce. Marinades. Taco seasonings. Relishes.  Fats and Oils Butter, stick margarine, lard, shortening and bacon fat. Coconut, palm kernel, or palm oils. Regular salad dressings.  Pickles and olives. Salted popcorn and pretzels.  The items listed above may not be a complete list of foods and beverages to avoid.   

## 2019-01-11 ENCOUNTER — Encounter: Payer: Self-pay | Admitting: Internal Medicine

## 2019-01-11 ENCOUNTER — Ambulatory Visit: Payer: Medicare HMO | Admitting: Internal Medicine

## 2019-01-11 ENCOUNTER — Other Ambulatory Visit: Payer: Self-pay

## 2019-01-11 VITALS — BP 138/76 | HR 81 | Temp 97.9°F | Resp 14 | Ht 72.0 in | Wt 175.0 lb

## 2019-01-11 DIAGNOSIS — Z79899 Other long term (current) drug therapy: Secondary | ICD-10-CM | POA: Diagnosis not present

## 2019-01-11 DIAGNOSIS — G250 Essential tremor: Secondary | ICD-10-CM | POA: Diagnosis not present

## 2019-01-11 DIAGNOSIS — E782 Mixed hyperlipidemia: Secondary | ICD-10-CM

## 2019-01-11 DIAGNOSIS — I10 Essential (primary) hypertension: Secondary | ICD-10-CM

## 2019-01-11 DIAGNOSIS — R7309 Other abnormal glucose: Secondary | ICD-10-CM

## 2019-01-11 DIAGNOSIS — E559 Vitamin D deficiency, unspecified: Secondary | ICD-10-CM

## 2019-01-11 MED ORDER — PROPRANOLOL HCL ER 120 MG PO CP24: ORAL_CAPSULE | ORAL | 3 refills | Status: DC

## 2019-01-11 MED ORDER — DIAZEPAM 5 MG PO TABS: ORAL_TABLET | ORAL | 2 refills | Status: DC

## 2019-01-11 MED FILL — diazePAM 5 MG TABS: 5 | 30 days supply | Qty: 90 | Fill #0

## 2019-01-11 MED FILL — PROPRANOLOL ER 120 MG CAP: 120 | 90 days supply | Qty: 90 | Fill #0

## 2019-01-12 ENCOUNTER — Encounter: Payer: Self-pay | Admitting: Internal Medicine

## 2019-01-12 ENCOUNTER — Other Ambulatory Visit: Payer: Self-pay | Admitting: Internal Medicine

## 2019-01-12 LAB — COMPLETE METABOLIC PANEL WITH GFR
AG Ratio: 2.2 (calc) (ref 1.0–2.5)
ALT: 29 U/L (ref 9–46)
AST: 38 U/L — ABNORMAL HIGH (ref 10–35)
Albumin: 4.7 g/dL (ref 3.6–5.1)
Alkaline phosphatase (APISO): 40 U/L (ref 35–144)
BUN: 18 mg/dL (ref 7–25)
CO2: 26 mmol/L (ref 20–32)
Calcium: 9.9 mg/dL (ref 8.6–10.3)
Chloride: 102 mmol/L (ref 98–110)
Creat: 1.09 mg/dL (ref 0.70–1.18)
GFR, Est African American: 78 mL/min/{1.73_m2} (ref >=60)
GFR, Est Non African American: 67 mL/min/{1.73_m2} (ref >=60)
Globulin: 2.1 g/dL (calc) (ref 1.9–3.7)
Glucose, Bld: 99 mg/dL (ref 65–99)
Potassium: 3.9 mmol/L (ref 3.5–5.3)
Sodium: 139 mmol/L (ref 135–146)
Total Bilirubin: 0.9 mg/dL (ref 0.2–1.2)
Total Protein: 6.8 g/dL (ref 6.1–8.1)

## 2019-01-12 LAB — CBC WITH DIFFERENTIAL/PLATELET
Absolute Monocytes: 400 cells/uL (ref 200–950)
Basophils Absolute: 19 cells/uL (ref 0–200)
Basophils Relative: 0.4 %
Eosinophils Absolute: 28 cells/uL (ref 15–500)
Eosinophils Relative: 0.6 %
HCT: 48.7 % (ref 38.5–50.0)
Hemoglobin: 16.7 g/dL (ref 13.2–17.1)
Lymphs Abs: 865 cells/uL (ref 850–3900)
MCH: 34.4 pg — ABNORMAL HIGH (ref 27.0–33.0)
MCHC: 34.3 g/dL (ref 32.0–36.0)
MCV: 100.2 fL — ABNORMAL HIGH (ref 80.0–100.0)
MPV: 9.7 fL (ref 7.5–12.5)
Monocytes Relative: 8.5 %
Neutro Abs: 3389 cells/uL (ref 1500–7800)
Neutrophils Relative %: 72.1 %
Platelets: 156 10*3/uL (ref 140–400)
RBC: 4.86 10*6/uL (ref 4.20–5.80)
RDW: 12 % (ref 11.0–15.0)
Total Lymphocyte: 18.4 %
WBC: 4.7 10*3/uL (ref 3.8–10.8)

## 2019-01-12 LAB — LIPID PANEL
Cholesterol: 241 mg/dL — ABNORMAL HIGH (ref <200)
HDL: 85 mg/dL (ref >=40)
LDL Cholesterol (Calc): 119 mg/dL (calc) — ABNORMAL HIGH
Non-HDL Cholesterol (Calc): 156 mg/dL (calc) — ABNORMAL HIGH (ref <130)
Total CHOL/HDL Ratio: 2.8 (calc) (ref <5.0)
Triglycerides: 243 mg/dL — ABNORMAL HIGH (ref <150)

## 2019-01-12 LAB — VITAMIN D 25 HYDROXY (VIT D DEFICIENCY, FRACTURES): Vit D, 25-Hydroxy: 40 ng/mL (ref 30–100)

## 2019-01-12 LAB — MAGNESIUM: Magnesium: 1.7 mg/dL (ref 1.5–2.5)

## 2019-01-12 LAB — TSH: TSH: 2.82 mIU/L (ref 0.40–4.50)

## 2019-01-17 ENCOUNTER — Ambulatory Visit: Payer: Self-pay | Admitting: Adult Health

## 2019-01-17 ENCOUNTER — Telehealth: Payer: Self-pay

## 2019-01-17 ENCOUNTER — Encounter: Payer: Self-pay | Admitting: Adult Health

## 2019-01-17 NOTE — Telephone Encounter (Signed)
Patient was a no call/no show for their appointment today.   

## 2019-01-22 DIAGNOSIS — G4733 Obstructive sleep apnea (adult) (pediatric): Secondary | ICD-10-CM | POA: Diagnosis not present

## 2019-02-03 ENCOUNTER — Encounter: Payer: Self-pay | Admitting: Internal Medicine

## 2019-02-03 NOTE — Patient Instructions (Signed)

## 2019-02-03 NOTE — Progress Notes (Signed)
   History of Present Illness:     Patient is a very nice 73 yo MWM with with HTN, HLD, Pre-Diabetes, GERD and Vitamin D Deficiency who returns for 3 week f/u after starting Propranolol ER & Diazepam for  worsening essential tremor. Patient reports marked improvement in tremor especially when preforming projects as using tools, screwdrivers, etc. Sat his BP was elevated about 180 sys this am at home, but normal now. He's taking the Propanolol in the mornings. Relates wife has noted the improvement in tremor & also is happy that he has cut back on his alcohol consumption as he has noted that it does have a tendency to sedate him.   Medications  .  fenofibrate 134 MG capsule, TAKE 1 CAPSULE  EVERY DAY  .  propranolol ER  120 MG 24 hr capsule, Take 1 capsule every Morning for BP & tremor .  rosuvastatin 40 MG tablet, Take 1 tablet Daily for Cholesterol .  aspirin 81 MG tablet, Take  every other day.  .  NORCO 5-325 MG tablet, Take 1 tablet every 6 hours as needed for moderate pain. Marland Kitchen  VITAMIN B-COMPLEX , Take 1 tablet  daily. Marland Kitchen  VITAMIN D 5000 U Take 15,000 Units  every morning. .  diazepam (VALIUM) 5 MG tablet, Take 1/2 to 1 tablet 3 x /day for Tremor .  Esomeprazole 20 MG capsule, Take 1 capsule daily for Heartburn  .  Magnesium 400 MG CAPS, Take  every evening.  .  Omega-3 FISH OIL 1000 MG CAPS, Take every morning. .  vitamin E 1000 UNIT capsule, Take 3,000 Units  every morning.   Problem list He has Prostate cancer ([place]); GERD (gastroesophageal reflux disease); Hyperlipidemia, mixed; Essential hypertension; Vitamin D deficiency; Medication management; BMI 23.0-23.9, adult; Continuous tobacco abuse; CKD (chronic kidney disease) stage 3, GFR 30-59 ml/min; Abnormal glucose; Thoracic aorta atherosclerosis ([place]); Emphysema lung ([place]); Right inguinal hernia; Hypersomnia with sleep apnea; Snoring; Excessive drinking of alcohol; and Other secondary hypertension on their problem list.     Observations/Objective:  BP 132/78   Pulse 76   Temp (!) 97 F (36.1 C)   Resp 16   Ht 6' (1.829 m)   Wt 188 lb 12.8 oz (85.6 kg)   BMI 25.61 kg/m   HEENT - WNL. Neck - supple.  Chest - Clear equal BS. Cor - Nl HS. RRR w/o sig M MS- FROM w/o deformities.  Gait Nl. Neuro -  Nl w/o focal abnormalities. No tremor is evident today.  Assessment and Plan:  1. Familial tremor  - Continue Propranolol & Diazepam same , and advised that he will likely become tolerant to the mild sedating effect of the Diazepam.  2. Essential hypertension  -Advised monitor early am BP's for the next week & call if elevated for an additive hs BP med.       I discussed the assessment and treatment plan with the patient. The patient was provided an opportunity to ask questions and all were answered. The patient agreed with the plan and demonstrated an understanding of the instructions. The patient was advised to call back or seek an in-person evaluation if the symptoms worsen or if the condition fails to improve as anticipated.   Kirtland Bouchard, MD

## 2019-02-04 ENCOUNTER — Ambulatory Visit (INDEPENDENT_AMBULATORY_CARE_PROVIDER_SITE_OTHER): Payer: Medicare HMO | Admitting: Internal Medicine

## 2019-02-04 ENCOUNTER — Other Ambulatory Visit: Payer: Self-pay

## 2019-02-04 VITALS — BP 132/78 | HR 76 | Temp 97.0°F | Resp 16 | Ht 72.0 in | Wt 188.8 lb

## 2019-02-04 DIAGNOSIS — G250 Essential tremor: Secondary | ICD-10-CM

## 2019-02-04 DIAGNOSIS — I10 Essential (primary) hypertension: Secondary | ICD-10-CM

## 2019-02-19 MED FILL — ROSUVASTATIN CALCIUM 40 MG: 40 | 90 days supply | Qty: 90 | Fill #1

## 2019-02-22 DIAGNOSIS — G4733 Obstructive sleep apnea (adult) (pediatric): Secondary | ICD-10-CM | POA: Diagnosis not present

## 2019-02-25 DIAGNOSIS — G4733 Obstructive sleep apnea (adult) (pediatric): Secondary | ICD-10-CM | POA: Diagnosis not present

## 2019-03-08 ENCOUNTER — Other Ambulatory Visit: Payer: Self-pay | Admitting: Adult Health

## 2019-03-08 DIAGNOSIS — E782 Mixed hyperlipidemia: Secondary | ICD-10-CM

## 2019-03-08 MED FILL — FENOFIBRATE 134 MG CAPSULE: 134 | 90 days supply | Qty: 90 | Fill #0

## 2019-03-26 ENCOUNTER — Encounter: Payer: Self-pay | Admitting: Internal Medicine

## 2019-03-28 IMAGING — CT CT CHEST LUNG CANCER SCREENING LOW DOSE W/O CM
1 of 2 series · 10 of 20 positions shown, 13 images · non-contrast
Comparison: 11/30/2016 diagnostic CT.  No prior screening CT.

CLINICAL DATA: Thirty-five pack-year smoking history. Prostate
cancer 6 years ago with seed implants. Skin cancer. Current smoker.

EXAM:
CT CHEST WITHOUT CONTRAST LOW-DOSE FOR LUNG CANCER SCREENING
TECHNIQUE: Multidetector CT imaging of the chest was performed following the
standard protocol without IV contrast.

[ct lung segmentation data · axial · 0.79mm/px · z∈[-423,-423]mm · 10 of 374 frames shown]
[frame 1/374  mediastinal]
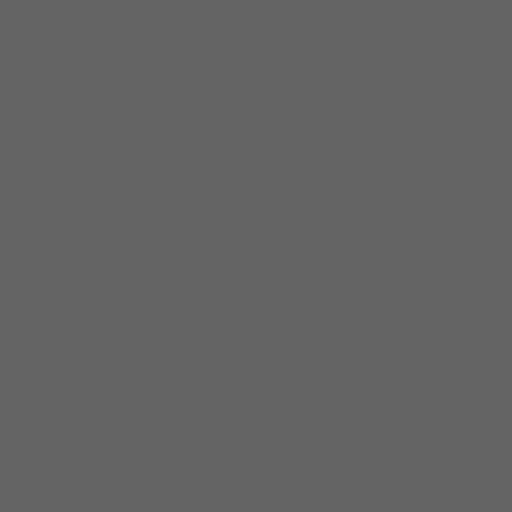
[frame 1/374  lung]
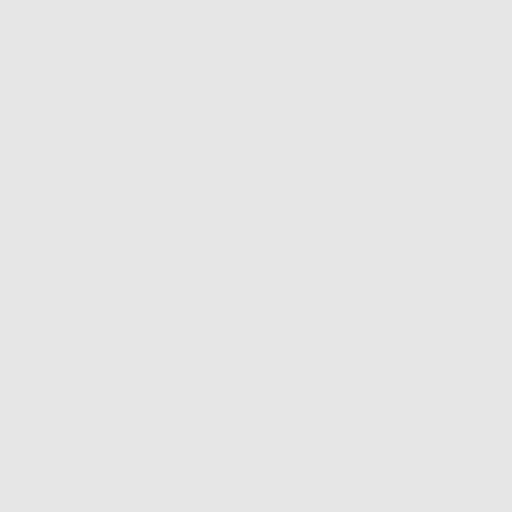
[frame 42/374  lung]
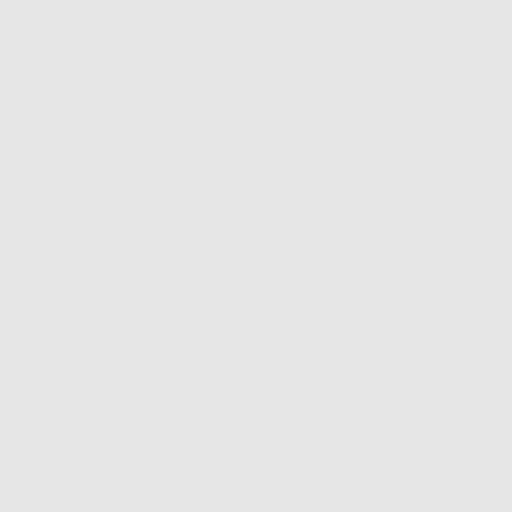
[frame 83/374  lung]
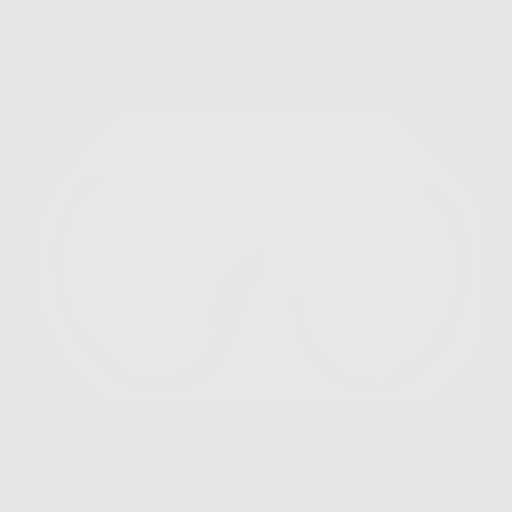
[frame 125/374  lung]
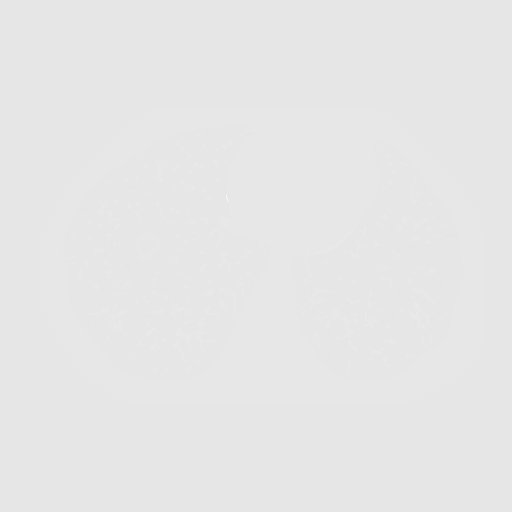
[frame 166/374  mediastinal]
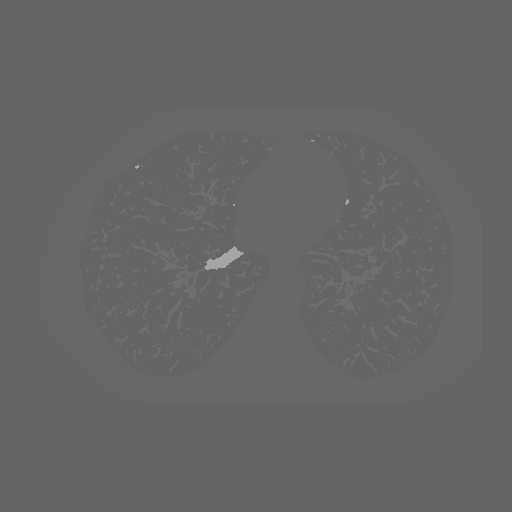
[frame 166/374  lung]
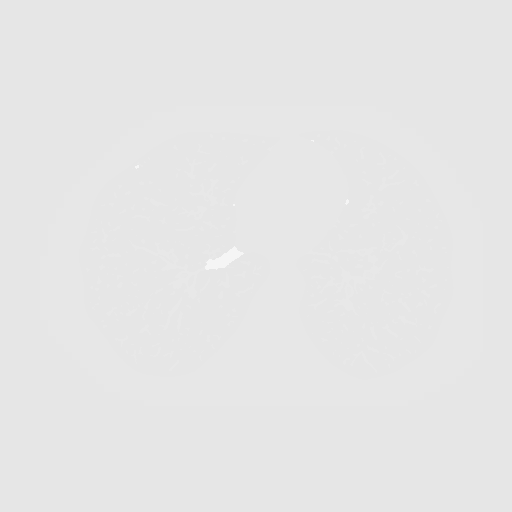
[frame 208/374  lung]
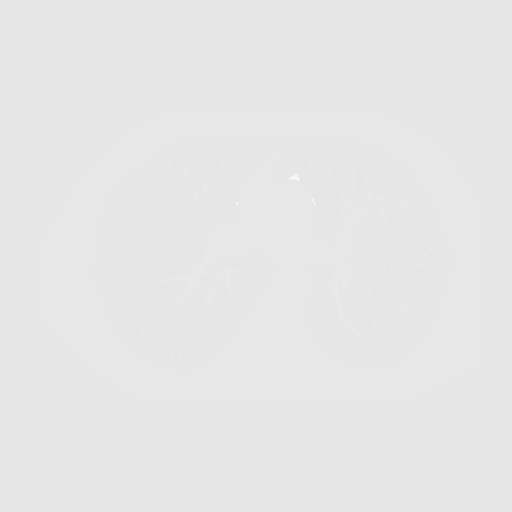
[frame 249/374  lung]
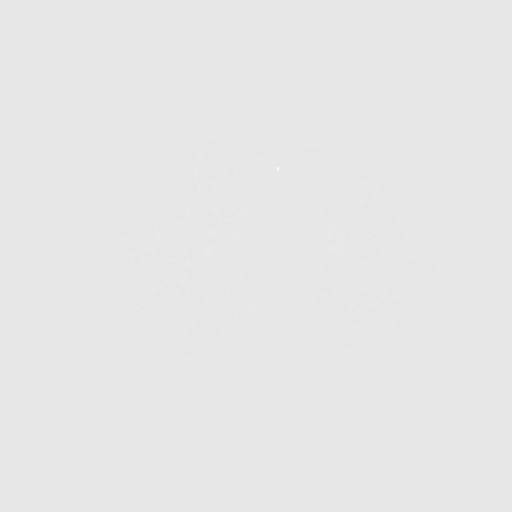
[frame 291/374  lung]
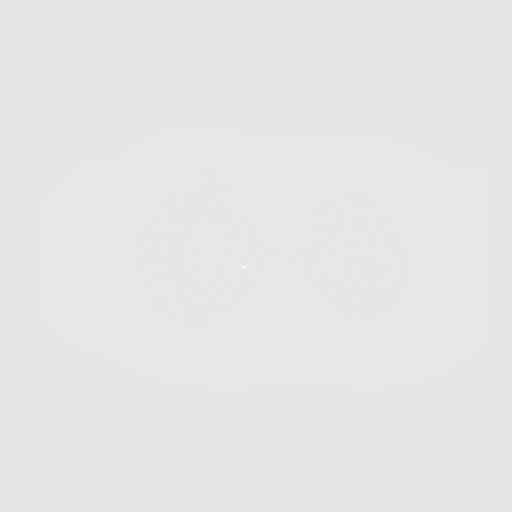
[frame 332/374  mediastinal]
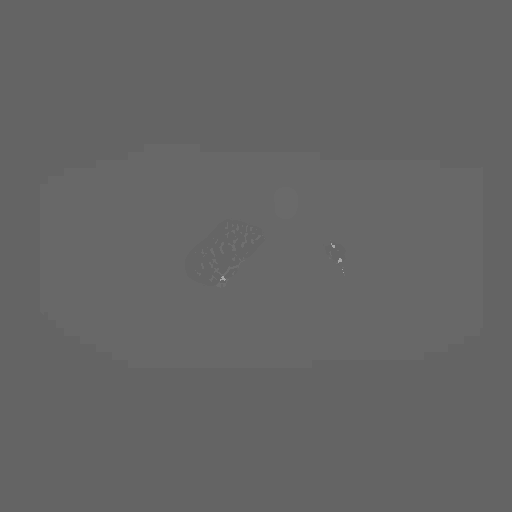
[frame 332/374  lung]
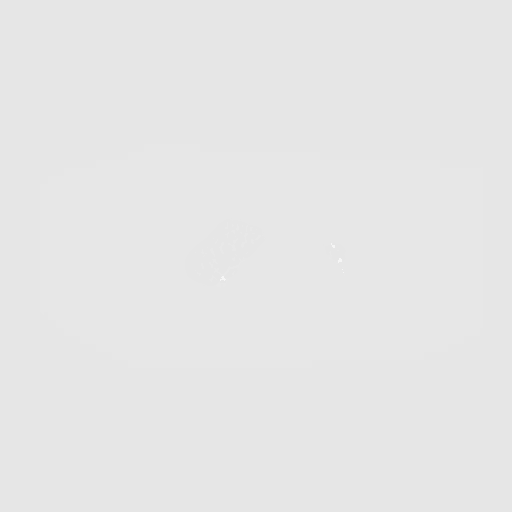
[frame 374/374  lung]
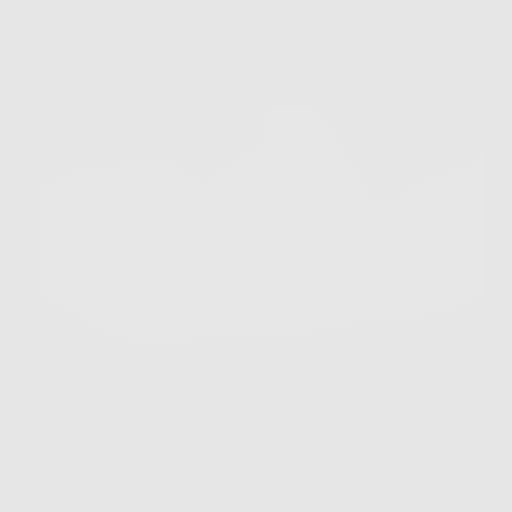

[10 of 20 positions shown; findings below may reference images not displayed]

FINDINGS: Cardiovascular: Aortic and branch vessel atherosclerosis. Normal
heart size, without pericardial effusion.

Mediastinum/Nodes: No supraclavicular adenopathy. A right axillary
skin lesion measures 3.5 cm today versus 3.4 cm on 11/30/2016. Fluid
density.

No mediastinal or definite hilar adenopathy, given limitations of
unenhanced CT.

Lungs/Pleura: No pleural fluid. Mild centrilobular emphysema.
Bilateral pulmonary nodules. The largest is in the right middle lobe
and measures volume derived equivalent diameter 4.0 mm.

Upper Abdomen: Bilateral hepatic low-density lesions are likely
cysts and were present on the prior. Normal imaged portions of the
spleen, stomach, pancreas, adrenal glands, kidneys. Abdominal aortic
atherosclerosis.

Musculoskeletal: No acute osseous abnormality.
IMPRESSION: 1. Lung-RADS 2, benign appearance or behavior. Continue annual
screening with low-dose chest CT without contrast in 12 months.
2. Aortic atherosclerosis (5X0WM-L0J.J) and emphysema (5X0WM-KG5.F).
3. Probable sebaceous cyst in the right axilla, similar.

## 2019-04-04 ENCOUNTER — Ambulatory Visit: Payer: Medicare HMO | Attending: Internal Medicine

## 2019-04-04 DIAGNOSIS — Z23 Encounter for immunization: Secondary | ICD-10-CM | POA: Insufficient documentation

## 2019-04-04 NOTE — Progress Notes (Signed)
   Covid-19 Vaccination Clinic  Name:  Juan Torres    MRN: [id number] DOB: 02/10/1945  04/04/2019  [name] was observed post Covid-19 immunization for 15 minutes without incidence. He was provided with Vaccine Information Sheet and instruction to access the V-Safe system.   [name] was instructed to call [phone] with any severe reactions post vaccine: Marland Kitchen Difficulty breathing  . Swelling of your face and throat  . A fast heartbeat  . A bad rash all over your body  . Dizziness and weakness    Immunizations Administered    Name Date Dose VIS Date Route   Pfizer COVID-19 Vaccine 04/04/2019  2:57 PM 0.3 mL 01/18/2019 Intramuscular   Manufacturer: [place]   Lot: [id number]   [place]: [id number]

## 2019-04-14 ENCOUNTER — Encounter: Payer: Self-pay | Admitting: Internal Medicine

## 2019-04-14 NOTE — Progress Notes (Signed)
Annual  Screening/Preventative Visit  & Comprehensive Evaluation & Examination     This very nice 74 y.o.male presents for a Screening /Preventative Visit & comprehensive evaluation and management of multiple medical co-morbidities.  Patient has been followed for HTN, HLD, Prediabetes and Vitamin D Deficiency. Patient's GERD is controlled with OTC Nexium 20 mg. Patient was treated in 2014 for Prostate Ca with Brachytherapy. Patient also unfortunately has hx/o heavy Alcohol use. Patient also has Hereditary Tremor and was recently started on Propranolol and Diazepam  - the later stopped by his wife as he had "mixed" with alcohol and had an injury / laceration to his hand. Patient is very reticent  to stop drinking.     HTN predates since 1998. Patient's BP has been controlled at home.  Today's BP is borderline at 144/84. 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  Patient denies any cardiac symptoms as chest pain, palpitations, shortness of breath, dizziness or ankle swelling.     Patient's hyperlipidemia is controlled with diet and medications. Patient denies myalgias or other medication SE's. Last lipids were not at goal:  Lab Results  Component Value Date   CHOL 241 (H) 01/11/2019   HDL 85 01/11/2019   LDLCALC 119 (H) 01/11/2019   TRIG 243 (H) 01/11/2019   CHOLHDL 2.8 01/11/2019       Patient has hx/o prediabetes (A1c 5.7% / 2014)  and patient denies reactive hypoglycemic symptoms, visual blurring, diabetic polys or paresthesias. Last A1c was Normal & at goal:  Lab Results  Component Value Date   HGBA1C 5.4 10/17/2018         Finally, patient has history of Vitamin D Deficiency ("13" / 2013)and last vitamin D was still low (goal 70-100):  Lab Results  Component Value Date   VD25OH 40 01/11/2019    Current Outpatient Medications on File Prior to Visit  Medication Sig  . aspirin 81 MG tablet Take 81 mg by mouth every  other day.   . B Complex Vitamins (VITAMIN B-COMPLEX PO) Take 1 tablet by mouth daily.  . Cholecalciferol (VITAMIN D3) 125 MCG (5000 UT) TABS Take 15,000 Units by mouth every morning.  Marland Kitchen esomeprazole (NEXIUM) 20 MG capsule Take 1 capsule daily for Heartburn (Patient taking differently: Take 20 mg by mouth every evening. )  . fenofibrate micronized (LOFIBRA) 134 MG capsule Take 1 capsule Daily for Triglycerides (Blood Fats)  . Magnesium 400 MG CAPS Take 400 mg by mouth every evening.   . Omega-3 Fatty Acids (FISH OIL) 1000 MG CAPS Take 1,000 mg by mouth every morning.  . propranolol ER (INDERAL LA) 120 MG 24 hr capsule Take 1 capsule every Morning for BP & tremor  . rosuvastatin (CRESTOR) 40 MG tablet Take 1 tablet Daily for Cholesterol  . vitamin E 1000 UNIT capsule Take 3,000 Units by mouth every morning.   . diazepam (VALIUM) 5 MG tablet Take 1/2 to 1 tablet 3 x /day for Tremor (Patient not taking: Reported on 04/15/2019)   No current facility-administered medications on file prior to visit.   Allergies  Allergen Reactions  . Lipitor [Atorvastatin] Other (See Comments)    fatigued  . Niacin And Related Other (See Comments)    Flushing  . Penicillins Rash   Past Medical History:  Diagnosis Date  . Adenomatous colon polyp   . GERD (gastroesophageal reflux disease)   . History of basal cell  cancer 2012  . Hyperlipidemia   . Hypertension   . Prediabetes   . Prostate cancer ([place]) DX 12/19/11   bx=Adenocarcinoma,gleason=3+3=6,volume=42cc,PSA=4.63  . Pulmonary nodules 12/10/2018   Numerous unchanged/benign appearing per CT lung 12/2018  . Vitamin D deficiency    Health Maintenance  Topic Date Due  . Hepatitis C Screening  1945/02/28  . INFLUENZA VACCINE  09/08/2018  . TETANUS/TDAP  10/19/2021  . COLONOSCOPY  12/01/2021  . PNA vac Low Risk Adult  Addressed   Immunization History  Administered Date(s) Administered  . DT (Pediatric) 10/20/2011  . PFIZER SARS-COV-2 Vaccination  04/04/2019  . Pneumococcal Conjugate-13 11/14/2017  . Pneumococcal Polysaccharide-23 04/28/2016  . Zoster 10/20/2011   Last Colon - 10.25.2013 - Dr Deatra Ina - recc 10 year f/u due Nov 2023.  Past Surgical History:  Procedure Laterality Date  . COLONOSCOPY W/ POLYPECTOMY     Adenomatous  . CYSTOSCOPY N/A 04/04/2012   Procedure: CYSTOSCOPY;  Surgeon: Molli Hazard, MD;  Location: [facility];  Service: Urology;  Laterality: N/A;  . EYE SURGERY     bilateral cataracts   . INGUINAL HERNIA REPAIR Bilateral 10/18/2018   Procedure: LAPAROSCOPIC BILATERAL INGUINAL HERNIA REPAIR;  Surgeon: Johnathan Hausen, MD;  Location: [facility];  Service: General;  Laterality: Bilateral;  . PROSTATE BIOPSY  12/19/2011   Procedure: BIOPSY TRANSRECTAL ULTRASONIC PROSTATE (TUBP);  Surgeon: Molli Hazard, MD;  Location[facility];  Service: Urology;  Laterality: N/A;    . RADIOACTIVE SEED IMPLANT N/A 04/04/2012   Procedure: RADIOACTIVE SEED IMPLANT;  Surgeon: Molli Hazard, MD;  Location: [facility];  Service: Urology;  Laterality: N/A;  Seeds Implanted     72  Seeds Found in Bladder     None    Family History  Problem Relation Age of Onset  . Breast cancer Mother   . Cancer Maternal Grandfather        colon  . Heart attack Father 29  . Dementia Father   . Prostate cancer Maternal Uncle        prostate  . Prostate cancer Cousin        prostate cancer 1st maternal   . Ulcers Maternal Grandmother    Social History   Socioeconomic History  . Marital status: Married    Spouse name: [name]  . Number of children: 2 sons  Occupational History  . Retired Librarian, academic from [employer]  Tobacco Use  . Smoking status: Former Smoker    Packs/day: 1.00    Years: 30.00    Pack years: 30.00    Types: Cigarettes    Quit date: 12/11/2018    Years since quitting: 0.3  . Smokeless tobacco: Never Used  . Tobacco comment: smokes cigarettes and marijuana  nearly daily  Substance and Sexual Activity  . Alcohol use: Yes    Alcohol/week: 36.0 standard drinks    Types: 36 Cans of beer per week    Comment: 4-6 beers daily, rum  . Drug use: Yes    Frequency: 6.0 times per week    Types: Marijuana    Comment: / marijuana-last smoked 10/15/2018  . Sexual activity: Not on file     ROS Constitutional: Denies fever, chills, weight loss/gain, headaches, insomnia,  night sweats or change in appetite. Does c/o fatigue. Eyes: Denies redness, blurred vision, diplopia, discharge, itchy or watery eyes.  ENT: Denies discharge, congestion, post nasal drip, epistaxis, sore throat, earache, hearing loss, dental pain, Tinnitus, Vertigo, Sinus pain or  snoring.  Cardio: Denies chest pain, palpitations, irregular heartbeat, syncope, dyspnea, diaphoresis, orthopnea, PND, claudication or edema Respiratory: denies cough, dyspnea, DOE, pleurisy, hoarseness, laryngitis or wheezing.  Gastrointestinal: Denies dysphagia, heartburn, reflux, water brash, pain, cramps, nausea, vomiting, bloating, diarrhea, constipation, hematemesis, melena, hematochezia, jaundice or hemorrhoids Genitourinary: Denies dysuria, frequency, urgency, nocturia, hesitancy, discharge, hematuria or flank pain Musculoskeletal: Denies arthralgia, myalgia, stiffness, Jt. Swelling, pain, limp or strain/sprain. Denies Falls. Skin: Denies puritis, rash, hives, warts, acne, eczema or change in skin lesion Neuro: No weakness, tremor, incoordination, spasms, paresthesia or pain Psychiatric: Denies confusion, memory loss or sensory loss. Denies Depression. Endocrine: Denies change in weight, skin, hair change, nocturia, and paresthesia, diabetic polys, visual blurring or hyper / hypo glycemic episodes.  Heme/Lymph: No excessive bleeding, bruising or enlarged lymph nodes.  Physical Exam  BP (!) 144/84   Pulse 88   Temp (!) 96.9 F (36.1 C)   Resp 16   Ht 6' (1.829 m)   Wt 190 lb 3.2 oz (86.3 kg)   BMI  25.80 kg/m   General Appearance: Well nourished and well groomed and in no apparent distress.  Eyes: PERRLA, EOMs, conjunctiva no swelling or erythema, normal fundi and vessels. Sinuses: No frontal/maxillary tenderness ENT/Mouth: EACs patent / TMs  nl. Nares clear without erythema, swelling, mucoid exudates. Oral hygiene is good. No erythema, swelling, or exudate. Tongue normal, non-obstructing. Tonsils not swollen or erythematous. Hearing normal.  Neck: Supple, thyroid not palpable. No bruits, nodes or JVD. Respiratory: Respiratory effort normal.  BS equal and clear bilateral without rales, rhonci, wheezing or stridor. Cardio: Heart sounds are normal with regular rate and rhythm and no murmurs, rubs or gallops. Peripheral pulses are normal and equal bilaterally without edema. No aortic or femoral bruits. Chest: symmetric with normal excursions and percussion.  Abdomen: Soft, with Nl bowel sounds. Nontender, no guarding, rebound, hernias, masses, or organomegaly.  Lymphatics: Non tender without lymphadenopathy.  Musculoskeletal: Full ROM all peripheral extremities, joint stability, 5/5 strength, and normal gait. Skin: Warm and dry without rashes, lesions, cyanosis, clubbing or  ecchymosis.  Neuro: Cranial nerves intact, reflexes equal bilaterally. Normal muscle tone, no cerebellar symptoms. Sensation intact.  Pysch: Alert and oriented X 3 with normal affect, insight and judgment appropriate.   Assessment and Plan  1. Annual Preventative/Screening Exam   2. Essential hypertension  - EKG 12-Lead - [place], RETROPERITNL ABD,  LTD - Urinalysis, Routine w reflex microscopic - Microalbumin / creatinine urine ratio - CBC with Differential/Platelet - COMPLETE METABOLIC PANEL WITH GFR - Magnesium - TSH  3. Hyperlipidemia, mixed  - EKG 12-Lead - [place], RETROPERITNL ABD,  LTD - Lipid panel - TSH  4. Abnormal glucose  - EKG 12-Lead - [place], RETROPERITNL ABD,  LTD - Hemoglobin A1c - Insulin,  random  5. Vitamin D deficiency  - VITAMIN D 25 Hydroxy  6. Prediabetes  - EKG 12-Lead - [place], RETROPERITNL ABD,  LTD - Hemoglobin A1c - Insulin, random  7. Familial tremor   8. Screening for colorectal cancer  - POC Hemoccult Bld/Stl   9. History of prostate cancer  - PSA  10. BPH with obstruction/lower urinary tract symptoms  - PSA  11. Prostate cancer screening  - PSA  12. Screening for ischemic heart disease  - EKG 12-Lead  13. Smoker  - EKG 12-Lead - [place], RETROPERITNL ABD,  LTD  14. Thoracic aorta atherosclerosis (HCC)  - EKG 12-Lead - [place], RETROPERITNL ABD,  LTD  15. Screening for AAA (aortic abdominal aneurysm)  -  [place], RETROPERITNL ABD,  LTD  16. Medication management  - Urinalysis, Routine w reflex microscopic - Microalbumin / creatinine urine ratio - CBC with Differential/Platelet - COMPLETE METABOLIC PANEL WITH GFR - Magnesium - Lipid panel - TSH - Hemoglobin A1c - Insulin, random - VITAMIN D 25 Hydroxy         Patient was counseled in prudent diet, weight control to achieve/maintain BMI less than 25, BP monitoring, regular exercise and medications as discussed.  Discussed med effects and SE's. Routine screening labs and tests as requested with regular follow-up as recommended. Over 40 minutes of exam, counseling, chart review and high complex critical decision making was performed   Kirtland Bouchard, MD

## 2019-04-14 NOTE — Patient Instructions (Signed)
Vit D  & Vit C 1,000 mg   are recommended to help protect  against the Covid-19 and other Corona viruses.    Also it's recommended  to take  Zinc 50 mg  to help  protect against the Covid-19   and best place to get  is also on [employer]  and don't pay more than 6-8 cents /pill !  ================================ Coronavirus (COVID-19) Are you at risk?  Are you at risk for the Coronavirus (COVID-19)?  To be considered HIGH RISK for Coronavirus (COVID-19), you have to meet the following criteria:  . Traveled to [place], [place], [place], [place] or [place]; or in the [place] to [place], [place], [place]  . or [place]; and have fever, cough, and shortness of breath within the last 2 weeks of travel OR . Been in close contact with a person diagnosed with COVID-19 within the last 2 weeks and have  . fever, cough,and shortness of breath .  . IF YOU DO NOT MEET THESE CRITERIA, YOU ARE CONSIDERED LOW RISK FOR COVID-19.  What to do if you are HIGH RISK for COVID-19?  . If you are having a medical emergency, call 911. . Seek medical care right away. Before you go to a doctor's office, urgent care or emergency department, .  call ahead and tell them about your recent travel, contact with someone diagnosed with COVID-19  .  and your symptoms.  . You should receive instructions from your physician's office regarding next steps of care.  . When you arrive at healthcare provider, tell the healthcare staff immediately you have returned from  . visiting [place], [place], [place], [place] or [place]; or traveled in the [place] to [place], [place],  . [place] or [place] in the last two weeks or you have been in close contact with a person diagnosed with  . COVID-19 in the last 2 weeks.   . Tell the health care staff about your symptoms: fever, cough and shortness of breath. . After you have been seen by a medical provider, you will be either: o Tested for (COVID-19) and  discharged home on quarantine except to seek medical care if  o symptoms worsen, and asked to  - Stay home and avoid contact with others until you get your results (4-5 days)  - Avoid travel on public transportation if possible (such as bus, train, or airplane) or o Sent to the Emergency Department by EMS for evaluation, COVID-19 testing  and  o possible admission depending on your condition and test results.  What to do if you are LOW RISK for COVID-19?  Reduce your risk of any infection by using the same precautions used for avoiding the common cold or flu:  . Wash your hands often with soap and warm water for at least 20 seconds.  If soap and water are not readily available,  . use an alcohol-based hand sanitizer with at least 60% alcohol.  . If coughing or sneezing, cover your mouth and nose by coughing or sneezing into the elbow areas of your shirt or coat, .  into a tissue or into your sleeve (not your hands). . Avoid shaking hands with others and consider head nods or verbal greetings only. . Avoid touching your eyes, nose, or mouth with unwashed hands.  . Avoid close contact with people who are sick. . Avoid places or events with large numbers of people in one location, like concerts or sporting events. .   Carefully consider travel plans you have or are making. . If you are planning any travel outside or inside the [place], visit the CDC's Travelers' Health webpage for the latest health notices. . If you have some symptoms but not all symptoms, continue to monitor at home and seek medical attention  . if your symptoms worsen. . If you are having a medical emergency, call 911. >>>>>>>>>>>>>>>>>>>>>>>>>>>>>>>>>>>>>>>>>>>>>>>>>>>>>>> We Do NOT Approve of  [facility] [place] Soliciting Our Patients  To Do Home Visits  & We Do NOT Approve of [employer] > > > > > > > > > > > > > > > > > > > > > > > > > > > > > > > > > > >  > > > >   Preventive Care for Adults  A  healthy lifestyle and preventive care can promote health and wellness. Preventive health guidelines for men include the following key practices:  A routine yearly physical is a good way to check with your health care provider about your health and preventative screening. It is a chance to share any concerns and updates on your health and to receive a thorough exam.  Visit your dentist for a routine exam and preventative care every 6 months. Brush your teeth twice a day and floss once a day. Good oral hygiene prevents tooth decay and gum disease.  The frequency of eye exams is based on your age, health, family medical history, use of contact lenses, and other factors. Follow your health care provider's recommendations for frequency of eye exams.  Eat a healthy diet. Foods such as vegetables, fruits, whole grains, low-fat dairy products, and lean protein foods contain the nutrients you need without too many calories. Decrease your intake of foods high in solid fats, added sugars, and salt. Eat the right amount of calories for you. Get information about a proper diet from your health care provider, if necessary.  Regular physical exercise is one of the most important things you can do for your health. Most adults should get at least 150 minutes of moderate-intensity exercise (any activity that increases your heart rate and causes you to sweat) each week. In addition, most adults need muscle-strengthening exercises on 2 or more days a week.  Maintain a healthy weight. The body mass index (BMI) is a screening tool to identify possible weight problems. It provides an estimate of body fat based on height and weight. Your health care provider can find your BMI and can help you achieve or maintain a healthy weight. For adults 20 years and older:  A BMI below 18.5 is considered underweight.  A BMI of 18.5 to 24.9 is normal.  A BMI of 25 to 29.9 is considered overweight.  A BMI of 30 and above is considered  obese.  Maintain normal blood lipids and cholesterol levels by exercising and minimizing your intake of saturated fat. Eat a balanced diet with plenty of fruit and vegetables. Blood tests for lipids and cholesterol should begin at age 20 and be repeated every 5 years. If your lipid or cholesterol levels are high, you are over 50, or you are at high risk for heart disease, you may need your cholesterol levels checked more frequently. Ongoing high lipid and cholesterol levels should be treated with medicines if diet and exercise are not working.  If you smoke, find out from your health care provider how to quit. If you do not use tobacco, do not start.  Lung cancer   screening is recommended for adults aged 55-80 years who are at high risk for developing lung cancer because of a history of smoking. A yearly low-dose CT scan of the lungs is recommended for people who have at least a 30-pack-year history of smoking and are a current smoker or have quit within the past 15 years. A pack year of smoking is smoking an average of 1 pack of cigarettes a day for 1 year (for example: 1 pack a day for 30 years or 2 packs a day for 15 years). Yearly screening should continue until the smoker has stopped smoking for at least 15 years. Yearly screening should be stopped for people who develop a health problem that would prevent them from having lung cancer treatment.  If you choose to drink alcohol, do not have more than 2 drinks per day. One drink is considered to be 12 ounces (355 mL) of beer, 5 ounces (148 mL) of wine, or 1.5 ounces (44 mL) of liquor.  Avoid use of street drugs. Do not share needles with anyone. Ask for help if you need support or instructions about stopping the use of drugs.  High blood pressure causes heart disease and increases the risk of stroke. Your blood pressure should be checked at least every 1-2 years. Ongoing high blood pressure should be treated with medicines, if weight loss and exercise  are not effective.  If you are 45-79 years old, ask your health care provider if you should take aspirin to prevent heart disease.  Diabetes screening involves taking a blood sample to check your fasting blood sugar level. Testing should be considered at a younger age or be carried out more frequently if you are overweight and have at least 1 risk factor for diabetes.  Colorectal cancer can be detected and often prevented. Most routine colorectal cancer screening begins at the age of 50 and continues through age 75. However, your health care provider may recommend screening at an earlier age if you have risk factors for colon cancer. On a yearly basis, your health care provider may provide home test kits to check for hidden blood in the stool. Use of a small camera at the end of a tube to directly examine the colon (sigmoidoscopy or colonoscopy) can detect the earliest forms of colorectal cancer. Talk to your health care provider about this at age 50, when routine screening begins. Direct exam of the colon should be repeated every 5-10 years through age 75, unless early forms of precancerous polyps or small growths are found.  Hepatitis C blood testing is recommended for all people born from 1945 through 1965 and any individual with known risks for hepatitis C.  Screening for abdominal aortic aneurysm (AAA)  by ultrasound is recommended for people who have history of high blood pressure or who are current or former smokers.  Healthy men should  receive prostate-specific antigen (PSA) blood tests as part of routine cancer screening. Talk with your health care provider about prostate cancer screening.  Testicular cancer screening is  recommended for adult males. Screening includes self-exam, a health care provider exam, and other screening tests. Consult with your health care provider about any symptoms you have or any concerns you have about testicular cancer.  Use sunscreen. Apply sunscreen liberally  and repeatedly throughout the day. You should seek shade when your shadow is shorter than you. Protect yourself by wearing long sleeves, pants, a wide-brimmed hat, and sunglasses year round, whenever you are outdoors.  Once a month,   do a whole-body skin exam, using a mirror to look at the skin on your back. Tell your health care provider about new moles, moles that have irregular borders, moles that are larger than a pencil eraser, or moles that have changed in shape or color.  Stay current with required vaccines (immunizations).  Influenza vaccine. All adults should be immunized every year.  Tetanus, diphtheria, and acellular pertussis (Td, Tdap) vaccine. An adult who has not previously received Tdap or who does not know his vaccine status should receive 1 dose of Tdap. This initial dose should be followed by tetanus and diphtheria toxoids (Td) booster doses every 10 years. Adults with an unknown or incomplete history of completing a 3-dose immunization series with Td-containing vaccines should begin or complete a primary immunization series including a Tdap dose. Adults should receive a Td booster every 10 years.  Zoster vaccine. One dose is recommended for adults aged 60 years or older unless certain conditions are present.    PREVNAR - Pneumococcal 13-valent conjugate (PCV13) vaccine. When indicated, a person who is uncertain of his immunization history and has no record of immunization should receive the PCV13 vaccine. An adult aged 19 years or older who has certain medical conditions and has not been previously immunized should receive 1 dose of PCV13 vaccine. This PCV13 should be followed with a dose of pneumococcal polysaccharide (PPSV23) vaccine. The PPSV23 vaccine dose should be obtained 1 or more year(s)after the dose of PCV13 vaccine. An adult aged 19 years or older who has certain medical conditions and previously received 1 or more doses of PPSV23 vaccine should receive 1 dose of PCV13.  The PCV13 vaccine dose should be obtained 1 or more years after the last PPSV23 vaccine dose.    PNEUMOVAX - Pneumococcal polysaccharide (PPSV23) vaccine. When PCV13 is also indicated, PCV13 should be obtained first. All adults aged 65 years and older should be immunized. An adult younger than age 65 years who has certain medical conditions should be immunized. Any person who resides in a nursing home or long-term care facility should be immunized. An adult smoker should be immunized. People with an immunocompromised condition and certain other conditions should receive both PCV13 and PPSV23 vaccines. People with human immunodeficiency virus (HIV) infection should be immunized as soon as possible after diagnosis. Immunization during chemotherapy or radiation therapy should be avoided. Routine use of PPSV23 vaccine is not recommended for American Indians, [place] Natives, or people younger than 65 years unless there are medical conditions that require PPSV23 vaccine. When indicated, people who have unknown immunization and have no record of immunization should receive PPSV23 vaccine. One-time revaccination 5 years after the first dose of PPSV23 is recommended for people aged 19-64 years who have chronic kidney failure, nephrotic syndrome, asplenia, or immunocompromised conditions. People who received 1-2 doses of PPSV23 before age 65 years should receive another dose of PPSV23 vaccine at age 65 years or later if at least 5 years have passed since the previous dose. Doses of PPSV23 are not needed for people immunized with PPSV23 at or after age 65 years.    Hepatitis A vaccine. Adults who wish to be protected from this disease, have certain high-risk conditions, work with hepatitis A-infected animals, work in hepatitis A research labs, or travel to or work in countries with a high rate of hepatitis A should be immunized. Adults who were previously unvaccinated and who anticipate close contact with an  international adoptee during the first 60 days after arrival in   the [place] from a country with a high rate of hepatitis A should be immunized.    Hepatitis B vaccine. Adults should be immunized if they wish to be protected from this disease, have certain high-risk conditions, may be exposed to blood or other infectious body fluids, are household contacts or sex partners of hepatitis B positive people, are clients or workers in certain care facilities, or travel to or work in countries with a high rate of hepatitis B.   Preventive Service / Frequency   Ages 65 and over  Blood pressure check.  Lipid and cholesterol check.  Lung cancer screening. / Every year if you are aged 55-80 years and have a 30-pack-year history of smoking and currently smoke or have quit within the past 15 years. Yearly screening is stopped once you have quit smoking for at least 15 years or develop a health problem that would prevent you from having lung cancer treatment.  Fecal occult blood test (FOBT) of stool. You may not have to do this test if you get a colonoscopy every 10 years.  Flexible sigmoidoscopy** or colonoscopy.** / Every 5 years for a flexible sigmoidoscopy or every 10 years for a colonoscopy beginning at age 50 and continuing until age 75.  Hepatitis C blood test.** / For all people born from 1945 through 1965 and any individual with known risks for hepatitis C.  Abdominal aortic aneurysm (AAA) screening./ Screening current or former smokers or have Hypertension.  Skin self-exam. / Monthly.  Influenza vaccine. / Every year.  Tetanus, diphtheria, and acellular pertussis (Tdap/Td) vaccine.** / 1 dose of Td every 10 years.   Zoster vaccine.** / 1 dose for adults aged 60 years or older.         Pneumococcal 13-valent conjugate (PCV13) vaccine.    Pneumococcal polysaccharide (PPSV23) vaccine.     Hepatitis A vaccine.** / Consult your health care provider.  Hepatitis B vaccine.** /  Consult your health care provider. Screening for abdominal aortic aneurysm (AAA)  by ultrasound is recommended for people who have history of high blood pressure or who are current or former smokers. ++++++++++ Recommend Adult Low Dose Aspirin or  coated  Aspirin 81 mg daily  To reduce risk of Colon Cancer 40 %,  Skin Cancer 26 % ,  Malignant Melanoma 46%  and  Pancreatic cancer 60% ++++++++++++++++++++++ Vitamin D goal  is between 70-100.  Please make sure that you are taking your Vitamin D as directed.  It is very important as a natural anti-inflammatory  helping hair, skin, and nails, as well as reducing stroke and heart attack risk.  It helps your bones and helps with mood. It also decreases numerous cancer risks so please take it as directed.  Low Vit D is associated with a 200-300% higher risk for CANCER  and 200-300% higher risk for HEART   ATTACK  &  STROKE.   ...................................... It is also associated with higher death rate at younger ages,  autoimmune diseases like Rheumatoid arthritis, Lupus, Multiple Sclerosis.    Also many other serious conditions, like depression, Alzheimer's Dementia, infertility, muscle aches, fatigue, fibromyalgia - just to name a few. ++++++++++++++++++++++ Recommend the book "The END of DIETING" by Dr Joel Fuhrman  & the book "The END of DIABETES " by Dr Joel Fuhrman At [employer].com - get book & Audio CD's    Being diabetic has a  300% increased risk for heart attack, stroke, cancer, and alzheimer- type vascular dementia. It is very important   that you work harder with diet by avoiding all foods that are white. Avoid white rice (brown & wild rice is OK), white potatoes (sweetpotatoes in moderation is OK), White bread or wheat bread or anything made out of white flour like bagels, donuts, rolls, buns, biscuits, cakes, pastries, cookies, pizza crust, and pasta (made from white flour & egg whites) - vegetarian pasta or spinach or wheat  pasta is OK. Multigrain breads like Arnold's or [employer], or multigrain sandwich thins or flatbreads.  Diet, exercise and weight loss can reverse and cure diabetes in the early stages.  Diet, exercise and weight loss is very important in the control and prevention of complications of diabetes which affects every system in your body, ie. Brain - dementia/stroke, eyes - glaucoma/blindness, heart - heart attack/heart failure, kidneys - dialysis, stomach - gastric paralysis, intestines - malabsorption, nerves - severe painful neuritis, circulation - gangrene & loss of a leg(s), and finally cancer and Alzheimers.    I recommend avoid fried & greasy foods,  sweets/candy, white rice (brown or wild rice or Quinoa is OK), white potatoes (sweet potatoes are OK) - anything made from white flour - bagels, doughnuts, rolls, buns, biscuits,white and wheat breads, pizza crust and traditional pasta made of white flour & egg white(vegetarian pasta or spinach or wheat pasta is OK).  Multi-grain bread is OK - like multi-grain flat bread or sandwich thins. Avoid alcohol in excess. Exercise is also important.    Eat all the vegetables you want - avoid meat, especially red meat and dairy - especially cheese.  Cheese is the most concentrated form of trans-fats which is the worst thing to clog up our arteries. Veggie cheese is OK which can be found in the fresh produce section at [employer] or [employer] or [employer]  ++++++++++++++++++++++ DASH Eating Plan  DASH stands for "Dietary Approaches to Stop Hypertension."   The DASH eating plan is a healthy eating plan that has been shown to reduce high blood pressure (hypertension). Additional health benefits may include reducing the risk of type 2 diabetes mellitus, heart disease, and stroke. The DASH eating plan may also help with weight loss. WHAT DO I NEED TO KNOW ABOUT THE DASH EATING PLAN? For the DASH eating plan, you will follow these general  guidelines:  Choose foods with a percent daily value for sodium of less than 5% (as listed on the food label).  Use salt-free seasonings or herbs instead of table salt or sea salt.  Check with your health care provider or pharmacist before using salt substitutes.  Eat lower-sodium products, often labeled as "lower sodium" or "no salt added."  Eat fresh foods.  Eat more vegetables, fruits, and low-fat dairy products.  Choose whole grains. Look for the word "whole" as the first word in the ingredient list.  Choose fish   Limit sweets, desserts, sugars, and sugary drinks.  Choose heart-healthy fats.  Eat veggie cheese   Eat more home-cooked food and less restaurant, buffet, and fast food.  Limit fried foods.  Cook foods using methods other than frying.  Limit canned vegetables. If you do use them, rinse them well to decrease the sodium.  When eating at a restaurant, ask that your food be prepared with less salt, or no salt if possible.                      WHAT FOODS CAN I EAT? Read Dr Joel Fuhrman's books on The End of Dieting &   The End of Diabetes  Grains Whole grain or whole wheat bread. Brown rice. Whole grain or whole wheat pasta. Quinoa, bulgur, and whole grain cereals. Low-sodium cereals. Corn or whole wheat flour tortillas. Whole grain cornbread. Whole grain crackers. Low-sodium crackers.  Vegetables Fresh or frozen vegetables (raw, steamed, roasted, or grilled). Low-sodium or reduced-sodium tomato and vegetable juices. Low-sodium or reduced-sodium tomato sauce and paste. Low-sodium or reduced-sodium canned vegetables.   Fruits All fresh, canned (in natural juice), or frozen fruits.  Protein Products  All fish and seafood.  Dried beans, peas, or lentils. Unsalted nuts and seeds. Unsalted canned beans.  Dairy Low-fat dairy products, such as skim or 1% milk, 2% or reduced-fat cheeses, low-fat ricotta or cottage cheese, or plain low-fat yogurt. Low-sodium or  reduced-sodium cheeses.  Fats and Oils Tub margarines without trans fats. Light or reduced-fat mayonnaise and salad dressings (reduced sodium). Avocado. Safflower, olive, or canola oils. Natural peanut or almond butter.  Other Unsalted popcorn and pretzels. The items listed above may not be a complete list of recommended foods or beverages. Contact your dietitian for more options.  ++++++++++++++++++++  WHAT FOODS ARE NOT RECOMMENDED? Grains/ White flour or wheat flour White bread. White pasta. White rice. Refined cornbread. Bagels and croissants. Crackers that contain trans fat.  Vegetables  Creamed or fried vegetables. Vegetables in a . Regular canned vegetables. Regular canned tomato sauce and paste. Regular tomato and vegetable juices.  Fruits Dried fruits. Canned fruit in light or heavy syrup. Fruit juice.  Meat and Other Protein Products Meat in general - RED meat & White meat.  Fatty cuts of meat. Ribs, chicken wings, all processed meats as bacon, sausage, bologna, salami, fatback, hot dogs, bratwurst and packaged luncheon meats.  Dairy Whole or 2% milk, cream, half-and-half, and cream cheese. Whole-fat or sweetened yogurt. Full-fat cheeses or blue cheese. Non-dairy creamers and whipped toppings. Processed cheese, cheese spreads, or cheese curds.  Condiments Onion and garlic salt, seasoned salt, table salt, and sea salt. Canned and packaged gravies. Worcestershire sauce. Tartar sauce. Barbecue sauce. Teriyaki sauce. Soy sauce, including reduced sodium. Steak sauce. Fish sauce. Oyster sauce. Cocktail sauce. Horseradish. Ketchup and mustard. Meat flavorings and tenderizers. Bouillon cubes. Hot sauce. Tabasco sauce. Marinades. Taco seasonings. Relishes.  Fats and Oils Butter, stick margarine, lard, shortening and bacon fat. Coconut, palm kernel, or palm oils. Regular salad dressings.  Pickles and olives. Salted popcorn and pretzels.  The items listed above may not be a  complete list of foods and beverages to avoid.    

## 2019-04-15 ENCOUNTER — Ambulatory Visit (INDEPENDENT_AMBULATORY_CARE_PROVIDER_SITE_OTHER): Payer: Medicare HMO | Admitting: Internal Medicine

## 2019-04-15 ENCOUNTER — Other Ambulatory Visit: Payer: Self-pay

## 2019-04-15 VITALS — BP 144/84 | HR 88 | Temp 96.9°F | Resp 16 | Ht 72.0 in | Wt 190.2 lb

## 2019-04-15 DIAGNOSIS — Z125 Encounter for screening for malignant neoplasm of prostate: Secondary | ICD-10-CM

## 2019-04-15 DIAGNOSIS — N401 Enlarged prostate with lower urinary tract symptoms: Secondary | ICD-10-CM

## 2019-04-15 DIAGNOSIS — Z136 Encounter for screening for cardiovascular disorders: Secondary | ICD-10-CM

## 2019-04-15 DIAGNOSIS — R7309 Other abnormal glucose: Secondary | ICD-10-CM

## 2019-04-15 DIAGNOSIS — Z1211 Encounter for screening for malignant neoplasm of colon: Secondary | ICD-10-CM

## 2019-04-15 DIAGNOSIS — I10 Essential (primary) hypertension: Secondary | ICD-10-CM

## 2019-04-15 DIAGNOSIS — G250 Essential tremor: Secondary | ICD-10-CM

## 2019-04-15 DIAGNOSIS — F17200 Nicotine dependence, unspecified, uncomplicated: Secondary | ICD-10-CM

## 2019-04-15 DIAGNOSIS — E782 Mixed hyperlipidemia: Secondary | ICD-10-CM

## 2019-04-15 DIAGNOSIS — N138 Other obstructive and reflux uropathy: Secondary | ICD-10-CM

## 2019-04-15 DIAGNOSIS — Z8546 Personal history of malignant neoplasm of prostate: Secondary | ICD-10-CM

## 2019-04-15 DIAGNOSIS — I700 Atherosclerosis of aorta: Secondary | ICD-10-CM

## 2019-04-15 DIAGNOSIS — Z0001 Encounter for general adult medical examination with abnormal findings: Secondary | ICD-10-CM

## 2019-04-15 DIAGNOSIS — Z0000 Encounter for general adult medical examination without abnormal findings: Secondary | ICD-10-CM

## 2019-04-15 DIAGNOSIS — E559 Vitamin D deficiency, unspecified: Secondary | ICD-10-CM

## 2019-04-15 DIAGNOSIS — Z79899 Other long term (current) drug therapy: Secondary | ICD-10-CM

## 2019-04-15 DIAGNOSIS — R7303 Prediabetes: Secondary | ICD-10-CM

## 2019-04-16 LAB — COMPLETE METABOLIC PANEL WITH GFR
AG Ratio: 2 (calc) (ref 1.0–2.5)
ALT: 32 U/L (ref 9–46)
AST: 38 U/L — ABNORMAL HIGH (ref 10–35)
Albumin: 4.1 g/dL (ref 3.6–5.1)
Alkaline phosphatase (APISO): 33 U/L — ABNORMAL LOW (ref 35–144)
BUN/Creatinine Ratio: 15 (calc) (ref 6–22)
BUN: 20 mg/dL (ref 7–25)
CO2: 24 mmol/L (ref 20–32)
Calcium: 9.2 mg/dL (ref 8.6–10.3)
Chloride: 104 mmol/L (ref 98–110)
Creat: 1.36 mg/dL — ABNORMAL HIGH (ref 0.70–1.18)
GFR, Est African American: 59 mL/min/{1.73_m2} — ABNORMAL LOW (ref >=60)
GFR, Est Non African American: 51 mL/min/{1.73_m2} — ABNORMAL LOW (ref >=60)
Globulin: 2.1 g/dL (calc) (ref 1.9–3.7)
Glucose, Bld: 192 mg/dL — ABNORMAL HIGH (ref 65–99)
Potassium: 3.8 mmol/L (ref 3.5–5.3)
Sodium: 139 mmol/L (ref 135–146)
Total Bilirubin: 0.8 mg/dL (ref 0.2–1.2)
Total Protein: 6.2 g/dL (ref 6.1–8.1)

## 2019-04-16 LAB — CBC WITH DIFFERENTIAL/PLATELET
Absolute Monocytes: 614 cells/uL (ref 200–950)
Basophils Absolute: 19 cells/uL (ref 0–200)
Basophils Relative: 0.2 %
Eosinophils Absolute: 19 cells/uL (ref 15–500)
Eosinophils Relative: 0.2 %
HCT: 44.4 % (ref 38.5–50.0)
Hemoglobin: 15.5 g/dL (ref 13.2–17.1)
Lymphs Abs: 670 cells/uL — ABNORMAL LOW (ref 850–3900)
MCH: 36 pg — ABNORMAL HIGH (ref 27.0–33.0)
MCHC: 34.9 g/dL (ref 32.0–36.0)
MCV: 103 fL — ABNORMAL HIGH (ref 80.0–100.0)
MPV: 10 fL (ref 7.5–12.5)
Monocytes Relative: 6.6 %
Neutro Abs: 7979 cells/uL — ABNORMAL HIGH (ref 1500–7800)
Neutrophils Relative %: 85.8 %
Platelets: 136 10*3/uL — ABNORMAL LOW (ref 140–400)
RBC: 4.31 10*6/uL (ref 4.20–5.80)
RDW: 12.7 % (ref 11.0–15.0)
Total Lymphocyte: 7.2 %
WBC: 9.3 10*3/uL (ref 3.8–10.8)

## 2019-04-16 LAB — MAGNESIUM: Magnesium: 1.6 mg/dL (ref 1.5–2.5)

## 2019-04-16 LAB — LIPID PANEL
Cholesterol: 201 mg/dL — ABNORMAL HIGH (ref <200)
HDL: 65 mg/dL (ref >=40)
LDL Cholesterol (Calc): 96 mg/dL (calc)
Non-HDL Cholesterol (Calc): 136 mg/dL (calc) — ABNORMAL HIGH (ref <130)
Total CHOL/HDL Ratio: 3.1 (calc) (ref <5.0)
Triglycerides: 277 mg/dL — ABNORMAL HIGH (ref <150)

## 2019-04-16 LAB — HEMOGLOBIN A1C
Hgb A1c MFr Bld: 5.3 % of total Hgb (ref <5.7)
Mean Plasma Glucose: 105 (calc)
eAG (mmol/L): 5.8 (calc)

## 2019-04-16 LAB — TSH: TSH: 2.76 mIU/L (ref 0.40–4.50)

## 2019-04-16 LAB — INSULIN, RANDOM: Insulin: 33.1 u[IU]/mL — ABNORMAL HIGH

## 2019-04-16 LAB — PSA: PSA: <0.1 ng/mL (ref <=4.0)

## 2019-04-16 LAB — VITAMIN D 25 HYDROXY (VIT D DEFICIENCY, FRACTURES): Vit D, 25-Hydroxy: 36 ng/mL (ref 30–100)

## 2019-04-17 LAB — URINALYSIS, ROUTINE W REFLEX MICROSCOPIC
Bilirubin Urine: NEGATIVE
Glucose, UA: NEGATIVE
Hgb urine dipstick: NEGATIVE
Ketones, ur: NEGATIVE
Leukocytes,Ua: NEGATIVE
Nitrite: NEGATIVE
Protein, ur: NEGATIVE
Specific Gravity, Urine: 1.013 (ref 1.001–1.03)
pH: 5.5 (ref 5.0–8.0)

## 2019-04-17 LAB — MICROALBUMIN / CREATININE URINE RATIO
Creatinine, Urine: 150 mg/dL (ref 20–320)
Microalb Creat Ratio: 2 mcg/mg creat (ref <30)
Microalb, Ur: 0.3 mg/dL

## 2019-04-23 ENCOUNTER — Encounter: Payer: Self-pay | Admitting: Adult Health

## 2019-04-25 ENCOUNTER — Other Ambulatory Visit: Payer: Self-pay

## 2019-04-25 ENCOUNTER — Ambulatory Visit: Payer: Medicare HMO | Admitting: Adult Health

## 2019-04-25 ENCOUNTER — Telehealth: Payer: Self-pay | Admitting: *Deleted

## 2019-04-25 ENCOUNTER — Encounter: Payer: Self-pay | Admitting: Adult Health

## 2019-04-25 VITALS — BP 152/105 | HR 68 | Temp 97.3°F | Ht 72.0 in | Wt 187.0 lb

## 2019-04-25 DIAGNOSIS — G4733 Obstructive sleep apnea (adult) (pediatric): Secondary | ICD-10-CM

## 2019-04-25 DIAGNOSIS — Z9989 Dependence on other enabling machines and devices: Secondary | ICD-10-CM

## 2019-04-25 NOTE — Telephone Encounter (Signed)
Message sent to [place] with [facility] notifying them of the new order for mask refitting.

## 2019-04-25 NOTE — Progress Notes (Signed)
PATIENT: Juan Torres DOB: Jan 24, 1946  REASON FOR VISIT: follow up HISTORY FROM: patient  HISTORY OF PRESENT ILLNESS: Today 04/25/19:  Juan Torres is a 74 year old male with a history of obstructive sleep apnea on CPAP.  His download indicates that he uses his machine nightly for compliance of 100%.  He uses his machine greater than 4 hours 29 out of 30 days for compliance of 97%.  He uses his machine on average 7 hours and 17 minutes.  His residual AHI is 1 on 5-15 centimeters of water.  His leak in the 95th percentile is 43.6 L/min.  Reports that he does feel the mask leaking.  He returns today for an evaluation.    HISTORY (Copied from Dr.Dohmeier's note)  Juan Torres a 74 y.o. year old  male patientseen on 10/02/2018 . Chiefconcernaccording to patient :" I have an inguinal hernia surgery coming up and I want to know if my wife is right about me having apnea.  I have the pleasure of seeing Juan Torres today,a right -handed White or Caucasian male with a possible sleep disorder. He  has a past medical history of Adenomatous colon polyp, GERD (gastroesophageal reflux disease), History of basal cell cancer (2012), Hyperlipidemia, Hypertension, Prediabetes, Prostate cancer ([place]) (DX 12/19/11), and Vitamin D deficiency.  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 They watch grandson [name] during the day.  The patient used to work in  24 hour shifts. Pets are present- a cat.  Tobacco use daily. ETOH use ; beer and a couple of shots of rum daily, Caffeine intake in form of Coffee( none  ) Soda( none) Tea ( rarely) or energy drinks. Regular exercise in form of walking before his hernia started bothering him.   [name] is gardening.    Sleep habits are as follows:The patient's dinner time is between 5-6 PM. The patient watches tv, drinks beer , and goes to bed at  8-9 PM. No difficulties to enter sleep , and he continues to sleep for 3-4 hours, wakes for coughing, he denies acid reflux on omeprazole.   The preferred sleep position is lateral , with the support of 1 pillow. Dreams are reportedly rare. 5-7 AM is the usual rise time. The patient wakes up spontaneously.  He reports not feeling refreshed or restored in AM,  He feels as if in a fog- with symptoms such as dry mouth,and residual fatigue. Naps are taken infrequently. Review of Systems: Out of a complete 14 system review, the patient complains of only the following symptoms, and all other reviewed systems are negative.:  Fatigue,witnessed apnea, snoring, fragmented sleep- coughing.  Motorcycle .   How likely are you to doze in the following situations: 0 = not likely, 1 = slight chance, 2 = moderate chance, 3 = high chance  Sitting and Reading? Watching Television? Sitting inactive in a public place (theater or meeting)? As a passenger in a car for an hour without a break? Lying down in the afternoon when circumstances permit? Sitting and talking to someone? Sitting quietly after lunch without alcohol? In a car, while stopped for a few minutes in traffic?  Total =2/ 24 points  FSS endorsed at 21/ 63 points.    REVIEW OF SYSTEMS: Out of a complete 14 system review of symptoms, the patient complains only of the following symptoms, and all other reviewed systems are  negative.  FSS 21 ESS 0  ALLERGIES: Allergies  Allergen Reactions  . Lipitor [Atorvastatin] Other (See Comments)    fatigued  . Niacin And Related Other (See Comments)    Flushing  . Penicillins Rash    HOME MEDICATIONS: Outpatient Medications Prior to Visit  Medication Sig Dispense Refill  . aspirin 81 MG tablet Take 81 mg by mouth every other day.     . B Complex Vitamins (VITAMIN B-COMPLEX PO) Take 1 tablet by mouth daily.    . Cholecalciferol (VITAMIN D3) 125 MCG (5000 UT) TABS Take 15,000 Units by mouth  every morning.    . diazepam (VALIUM) 5 MG tablet Take 1/2 to 1 tablet 3 x /day for Tremor 90 tablet 2  . esomeprazole (NEXIUM) 20 MG capsule Take 1 capsule daily for Heartburn (Patient taking differently: Take 20 mg by mouth every evening. )    . fenofibrate micronized (LOFIBRA) 134 MG capsule Take 1 capsule Daily for Triglycerides (Blood Fats) 90 capsule 3  . Magnesium 400 MG CAPS Take 400 mg by mouth every evening.     . Omega-3 Fatty Acids (FISH OIL) 1000 MG CAPS Take 1,000 mg by mouth every morning.    . propranolol ER (INDERAL LA) 120 MG 24 hr capsule Take 1 capsule every Morning for BP & tremor 90 capsule 3  . rosuvastatin (CRESTOR) 40 MG tablet Take 1 tablet Daily for Cholesterol 90 tablet 3  . vitamin E 1000 UNIT capsule Take 3,000 Units by mouth every morning.      No facility-administered medications prior to visit.    PAST MEDICAL HISTORY: Past Medical History:  Diagnosis Date  . Adenomatous colon polyp   . GERD (gastroesophageal reflux disease)   . History of basal cell cancer 2012  . Hyperlipidemia   . Hypertension   . Prediabetes   . Prostate cancer ([place]) DX 12/19/11   bx=Adenocarcinoma,gleason=3+3=6,volume=42cc,PSA=4.63  . Pulmonary nodules 12/10/2018   Numerous unchanged/benign appearing per CT lung 12/2018  . Vitamin D deficiency     PAST SURGICAL HISTORY: Past Surgical History:  Procedure Laterality Date  . COLONOSCOPY W/ POLYPECTOMY     Adenomatous  . CYSTOSCOPY N/A 04/04/2012   Procedure: CYSTOSCOPY;  Surgeon: Molli Hazard, MD;  Location: [facility];  Service: Urology;  Laterality: N/A;  . EYE SURGERY     bilateral cataracts   . INGUINAL HERNIA REPAIR Bilateral 10/18/2018   Procedure: LAPAROSCOPIC BILATERAL INGUINAL HERNIA REPAIR;  Surgeon: Johnathan Hausen, MD;  Location: [facility];  Service: General;  Laterality: Bilateral;  . PROSTATE BIOPSY  12/19/2011   Procedure: BIOPSY TRANSRECTAL ULTRASONIC PROSTATE (TUBP);  Surgeon: Molli Hazard, MD;  Location: [facility];  Service: Urology;  Laterality: N/A;    . RADIOACTIVE SEED IMPLANT N/A 04/04/2012   Procedure: RADIOACTIVE SEED IMPLANT;  Surgeon: Molli Hazard, MD;  Location: [facility];  Service: Urology;  Laterality: N/A;  Seeds Implanted     72  Seeds Found in Bladder     None     FAMILY HISTORY: Family History  Problem Relation Age of Onset  . Breast cancer Mother   . Cancer Maternal Grandfather        colon  . Heart attack Father 36  . Dementia Father   . Prostate cancer Maternal Uncle        prostate  . Prostate cancer Cousin        prostate cancer 1st maternal   . Ulcers Maternal  Grandmother     SOCIAL HISTORY: Social History   Socioeconomic History  . Marital status: Married    Spouse name: Not on file  . Number of children: 2  . Years of education: Not on file  . Highest education level: Not on file  Occupational History  . Not on file  Tobacco Use  . Smoking status: Former Smoker    Packs/day: 1.00    Years: 30.00    Pack years: 30.00    Types: Cigarettes    Quit date: 12/11/2018    Years since quitting: 0.3  . Smokeless tobacco: Never Used  . Tobacco comment: smokes cigarettes and marijuana nearly daily  Substance and Sexual Activity  . Alcohol use: Yes    Alcohol/week: 14.0 standard drinks    Types: 14 Cans of beer per week    Comment: 2 beers/day, 1/2 pint of rum a day  . Drug use: Yes    Frequency: 7.0 times per week    Types: Marijuana    Comment: / marijuana-last smoked 10/15/2018  . Sexual activity: Not on file  Other Topics Concern  . Not on file  Social History Narrative   Lives at home with spouse   Right handed   Caffeine: none   Social Determinants of Health   Financial Resource Strain:   . Difficulty of Paying Living Expenses:   Food Insecurity:   . Worried About Charity fundraiser in the Last Year:   . Arboriculturist in the Last Year:   Transportation Needs:   .  Film/video editor (Medical):   Marland Kitchen Lack of Transportation (Non-Medical):   Physical Activity:   . Days of Exercise per Week:   . Minutes of Exercise per Session:   Stress:   . Feeling of Stress :   Social Connections:   . Frequency of Communication with Friends and Family:   . Frequency of Social Gatherings with Friends and Family:   . Attends Religious Services:   . Active Member of Clubs or Organizations:   . Attends Archivist Meetings:   Marland Kitchen Marital Status:   Intimate Partner Violence:   . Fear of Current or Ex-Partner:   . Emotionally Abused:   Marland Kitchen Physically Abused:   . Sexually Abused:       PHYSICAL EXAM  Vitals:   04/25/19 1424  BP: (!) 152/105  Pulse: 68  Temp: (!) 97.3 F (36.3 C)  Weight: 187 lb (84.8 kg)  Height: 6' (1.829 m)   Body mass index is 25.36 kg/m.  Generalized: Well developed, in no acute distress  Chest: Lungs clear to auscultation bilaterally  Neurological examination  Mentation: Alert oriented to time, place, history taking. Follows all commands speech and language fluent Cranial nerve II-XII: Extraocular movements were full, visual field were full on confrontational test Head turning and shoulder shrug  were normal and symmetric. Motor: The motor testing reveals 5 over 5 strength of all 4 extremities. Good symmetric motor tone is noted throughout.  Sensory: Sensory testing is intact to soft touch on all 4 extremities. No evidence of extinction is noted.  Gait and station: Gait is normal.    DIAGNOSTIC DATA (LABS, IMAGING, TESTING) - I reviewed patient records, labs, notes, testing and imaging myself where available.  Lab Results  Component Value Date   WBC 9.3 04/15/2019   HGB 15.5 04/15/2019   HCT 44.4 04/15/2019   MCV 103.0 (H) 04/15/2019   PLT 136 (L) 04/15/2019  Component Value Date/Time   NA 139 04/15/2019 1101   K 3.8 04/15/2019 1101   CL 104 04/15/2019 1101   CO2 24 04/15/2019 1101   GLUCOSE 192 (H)  04/15/2019 1101   BUN 20 04/15/2019 1101   CREATININE 1.36 (H) 04/15/2019 1101   CALCIUM 9.2 04/15/2019 1101   PROT 6.2 04/15/2019 1101   ALBUMIN 4.2 08/02/2016 1014   AST 38 (H) 04/15/2019 1101   ALT 32 04/15/2019 1101   ALKPHOS 32 (L) 08/02/2016 1014   BILITOT 0.8 04/15/2019 1101   GFRNONAA 51 (L) 04/15/2019 1101   GFRAA 59 (L) 04/15/2019 1101   Lab Results  Component Value Date   CHOL 201 (H) 04/15/2019   HDL 65 04/15/2019   LDLCALC 96 04/15/2019   TRIG 277 (H) 04/15/2019   CHOLHDL 3.1 04/15/2019   Lab Results  Component Value Date   HGBA1C 5.3 04/15/2019   Lab Results  Component Value Date   VITAMINB12 780 06/12/2018   Lab Results  Component Value Date   TSH 2.76 04/15/2019      ASSESSMENT AND PLAN 74 y.o. year old male  has a past medical history of Adenomatous colon polyp, GERD (gastroesophageal reflux disease), History of basal cell cancer (2012), Hyperlipidemia, Hypertension, Prediabetes, Prostate cancer ([place]) (DX 12/19/11), Pulmonary nodules (12/10/2018), and Vitamin D deficiency. here with:  1. OSA on CPAP  - CPAP compliance excellent - Good treatment of AHI  - Encourage patient to use CPAP nightly and > 4 hours each night - mask refitting ordered - F/U in 1 year or sooner if needed   I spent 15 minutes of face-to-face and non-face-to-face time with patient.  This included previsit chart review, lab review, study review, order entry, electronic health record documentation, patient education.  Ward Givens, MSN, NP-C 04/25/2019, 2:27 PM [facility] [address] [place], [place] [address] [phone]

## 2019-04-25 NOTE — Patient Instructions (Signed)
Continue using CPAP nightly and greater than 4 hours each night °If your symptoms worsen or you develop new symptoms please let us know.  ° °

## 2019-04-26 MED FILL — PROPRANOLOL HCL ER 120 MG C: 120 | 90 days supply | Qty: 90 | Fill #1

## 2019-04-29 NOTE — Telephone Encounter (Signed)
Janett Billow confirmed receipt of message and stated she would process order.

## 2019-04-30 ENCOUNTER — Ambulatory Visit: Payer: Medicare HMO | Attending: Internal Medicine

## 2019-04-30 DIAGNOSIS — Z23 Encounter for immunization: Secondary | ICD-10-CM

## 2019-04-30 NOTE — Progress Notes (Signed)
   Covid-19 Vaccination Clinic  Name:  HOLLISTER BEAZLEY    MRN: [id number] DOB: Aug 18, 1945  04/30/2019  Mr. Riva was observed post Covid-19 immunization for 15 minutes without incident. He was provided with Vaccine Information Sheet and instruction to access the V-Safe system.   Mr. Stagnaro was instructed to call 911 with any severe reactions post vaccine: Marland Kitchen Difficulty breathing  . Swelling of face and throat  . A fast heartbeat  . A bad rash all over body  . Dizziness and weakness   Immunizations Administered    Name Date Dose VIS Date Route   Pfizer COVID-19 Vaccine 04/30/2019 11:03 AM 0.3 mL 01/18/2019 Intramuscular   Manufacturer: [place]   Lot: [id number]   [place]: [id number]

## 2019-05-16 MED FILL — ROSUVASTATIN CALCIUM 40 MG: 40 | 90 days supply | Qty: 90 | Fill #2

## 2019-05-17 ENCOUNTER — Other Ambulatory Visit: Payer: Self-pay | Admitting: Internal Medicine

## 2019-07-02 MED FILL — FENOFIBRATE 134 MG CAPSULE: 134 | 90 days supply | Qty: 90 | Fill #1

## 2019-07-25 NOTE — Progress Notes (Signed)
MEDICARE ANNUAL WELLNESS VISIT AND FOLLOW UP Assessment:   Juan Torres was seen today for medicare wellness and follow-up.  Diagnoses and all orders for this visit:  Annual medicare visit Annual CT lung cancer screening due Nov 2021 - ordered Hep C screening completed Information given on shingrix vaccine  Due annually   Atherosclerosis of aorta Per CT 2019 Control blood pressure, cholesterol, glucose, increase exercise.   Centrilobular emphysema Mild, by imaging; patient denies sx; patient did quit smoking  Essential hypertension Continue medication Monitor blood pressure at home; call if consistently over 130/80 Continue DASH diet.   Reminder to go to the ER if any CP, SOB, nausea, dizziness, severe HA, changes vision/speech, left arm numbness and tingling and jaw pain. -     CMP WITH GFR  Mixed hyperlipidemia -     Continue medication: rosuvastatin 40 mg, fenofibrate 134 mg  -     LDL at goal; lifestyle for trigs discussed -     CMP/GFR -     TSH -     Lipid panel  Other abnormal glucose       -     Recent A1Cs at goal Discussed diet/exercise, weight management  Defer A1C; check CMP  Vitamin D deficiency/ osteoporosis prophylaxis -     Below goal at recent check;  -     continue to recommend supplementation for goal of 60-100 -     Defer vitamin D level -     He has not increased dose; will increase to 8000 IU daily   Gastroesophageal reflux disease, esophagitis presence not specified -     Continue medications: nexium -     Discussed taper off PPI, patient prefers to defer at this time -     CBC with Differential/Platelet -     CMP/GFR  Hx of prostate cancer       -     PSA review stable, last undetectable       -     Was released by urology        -     No urinary symptoms, continue monitoring  CKD III       -      Increase fluids, avoid NSAIDS, monitor sugars, will monitor      -      CMP/GFR  Former smoker     -       Low-dose screening CT annually       -       Counseled on reducing/quitting- patient not ready at this time  History of skin cancer Continue close follow up by dermatology  Familial tremor Continue propranolol, PRN valium, avoid excess alcohol, monitor   Excess alcohol intake Discussed reducing to 1-2 drinks/day, patient has done well reducing intake  Snoring/hypersomnia with sleep apnea Mild per sleep study 2020 by Dr. Brett Fairy On CPAP, reducing alcohol    Over 30 minutes of exam, counseling, chart review, and critical decision making was performed  Future Appointments  Date Time Provider [place]  10/30/2019  9:30 AM Unk Pinto, MD [facility] None  10/31/2019  3:00 PM Ward Givens, NP [facility] None  04/20/2020  9:00 AM Unk Pinto, MD [facility] None     Plan:   During the course of the visit the patient was educated and counseled about appropriate screening and preventive services including:    Pneumococcal vaccine   Influenza vaccine  Prevnar 13  Td vaccine  Screening electrocardiogram  Colorectal cancer screening  Diabetes screening  Glaucoma screening  Nutrition counseling    Subjective:  Juan Torres is a 74 y.o. male who presents for Medicare Annual Wellness Visit and 3 month follow up for HTN, hyperlipidemia, prediabetes, and vitamin D Def.    In 10/2018 he underwent bil inguinal hernia repair by Dr. Hassell Done and has done well without complications.   He additionally reports snoring, wife has observed episodes of apnea, sleep study in 10/2018 found mild sleep apnea, hx of alcohol abuse, was recommended alcohol reduction vs CPAP,reports wearing CPAP nightly, endorses 100% compliance with restorative sleep.   He has tremor in the last 2 years, mother had similar, sister also has tremor, taking propranolol and PRN valium with improvement. Typically takes valium in AM, doesn't take in the evening to avoid combining with alcohol.   Hx of alcohol abuse, formerly was drinking a  fifth daily, down to 1-2 beers and 1/2 pint of rum daily.   he quit smoking Nov 2020 after noting wheezing and exertional dyspnea; resolved sx since quitting.  Has 30+ pack/year smoking hx, is undergoing annual low dose lung CT screenings. Last in 12/2018 and appeared benign though shows aortic atherosclerosis and mild centrilobular emphysema.   he has a diagnosis of GERD which is currently managed by nexium 20 mg daily.  he reports symptoms is currently well controlled, and denies breakthrough reflux, burning in chest, hoarseness or cough.    BMI is Body mass index is 24.95 kg/m., he has been working on diet and exercise. The patient has been very active over the past year- taking walks ~1 hr daily pushing his grandson in his stroller at a brisk pace. Patient is pleased with his weight progress, recounts excellent dietary choices he is making; presents with normalized BMI- previously noted in overweight range.  Wt Readings from Last 3 Encounters:  07/30/19 184 lb (83.5 kg)  04/25/19 187 lb (84.8 kg)  04/15/19 190 lb 3.2 oz (86.3 kg)   He has aortic atherosclerosis per CT 12/2018 His blood pressure has been controlled at home, today their BP is BP: 128/84  He does workout. He denies chest pain, shortness of breath, dizziness.   He is on cholesterol medication (rosuvastatin 40 mg daily, fenofibrate 160 mg daily) and denies myalgias. His LDL cholesterol is at goal, trigs remain elevated. The cholesterol last visit was:   Lab Results  Component Value Date   CHOL 201 (H) 04/15/2019   HDL 65 04/15/2019   LDLCALC 96 04/15/2019   TRIG 277 (H) 04/15/2019   CHOLHDL 3.1 04/15/2019    He has been working on diet and exercise for glucose management, and denies hyperglycemia, hypoglycemia , nausea, paresthesia of the feet, polydipsia and polyuria. Last A1C in the office was:  Lab Results  Component Value Date   HGBA1C 5.3 04/15/2019   He has CKD III followed our office. Last GFR was:  Lab  Results  Component Value Date   GFRNONAA 51 (L) 04/15/2019   Patient is on Vitamin D supplement (taking 15000 IU daily):  Lab Results  Component Value Date   VD25OH 36 04/15/2019     Patient was treated in 2014 for Prostate Ca with Brachytherapy, was released by urology.  Lab Results  Component Value Date   PSA <0.1 04/15/2019   PSA <0.1 03/08/2018   PSA <0.1 02/20/2017      Medication Review: Current Outpatient Medications on File Prior to Visit  Medication Sig Dispense Refill   aspirin 81 MG tablet Take  81 mg by mouth every other day.      B Complex Vitamins (VITAMIN B-COMPLEX PO) Take 1 tablet by mouth daily.     Cholecalciferol (VITAMIN D3) 125 MCG (5000 UT) TABS Take 15,000 Units by mouth every morning.     diazepam (VALIUM) 5 MG tablet Take 1/2 to 1 tablet 3 x /day for Tremor 90 tablet 2   esomeprazole (NEXIUM) 20 MG capsule Take 1 capsule daily for Heartburn (Patient taking differently: Take 20 mg by mouth every evening. )     fenofibrate micronized (LOFIBRA) 134 MG capsule Take 1 capsule Daily for Triglycerides (Blood Fats) 90 capsule 3   Magnesium 400 MG CAPS Take 400 mg by mouth every evening.      Omega-3 Fatty Acids (FISH OIL) 1000 MG CAPS Take 1,000 mg by mouth every morning.     propranolol ER (INDERAL LA) 120 MG 24 hr capsule Take 1 capsule every Morning for BP & tremor 90 capsule 3   rosuvastatin (CRESTOR) 40 MG tablet Take 1 tablet Daily for Cholesterol 90 tablet 3   vitamin E 1000 UNIT capsule Take 3,000 Units by mouth every morning.      No current facility-administered medications on file prior to visit.    Allergies: Allergies  Allergen Reactions   Lipitor [Atorvastatin] Other (See Comments)    fatigued   Niacin And Related Other (See Comments)    Flushing   Penicillins Rash    Current Problems (verified) has History of prostate cancer; GERD (gastroesophageal reflux disease); Hyperlipidemia, mixed; Essential hypertension; Vitamin D  deficiency; Medication management; BMI 24.0-24.9, adult; Former smoker (quit 12/11/2018, 30+ pack year history); CKD (chronic kidney disease) stage 3, GFR 30-59 ml/min; Abnormal glucose; Thoracic aorta atherosclerosis ([place]); Emphysema lung ([place]); Hypersomnia with sleep apnea; Snoring; Excessive drinking of alcohol; Other secondary hypertension; History of skin cancer; and Familial tremor on their problem list.  Screening Tests Immunization History  Administered Date(s) Administered   DT (Pediatric) 10/20/2011   PFIZER SARS-COV-2 Vaccination 04/04/2019, 04/30/2019   Pneumococcal Conjugate-13 11/14/2017   Pneumococcal Polysaccharide-23 04/28/2016   Zoster 10/20/2011   Preventative care: Last colonoscopy: 12/02/2011 due 2023 Chest CT screen: 12/2018 - benign nodules, aortic atherosclerosis, emphysema   Prior vaccinations: TD or Tdap: 2013  Influenza: DUE declines  Pneumococcal 23: 04/28/2016 Prevnar13: 11/2017 Shingles/Zostavax: 10/20/2011 Covid 19: 2/2, 2021, pfizer  Names of Other Physician/Practitioners you currently use: 1. [facility] here for primary care 2. Dr. Katy Fitch, eye doctor, last visit  2021  3. Dr. Mariea Clonts, dentist, last visit 2021, goes q19m 4. [facility], last visit 2021, goes annually, has upcoming appointment   Patient Care Team: Unk Pinto, MD as PCP - General (Internal Medicine) Arloa Koh, MD (Inactive) as Consulting Physician (Radiation Oncology) Clent Jacks, MD as Consulting Physician (Ophthalmology) Edrick Oh, MD as Consulting Physician (Nephrology)  Surgical: He  has a past surgical history that includes Prostate biopsy (12/19/2011); Colonoscopy w/ polypectomy; Radioactive seed implant (N/A, 04/04/2012); Cystoscopy (N/A, 04/04/2012); Eye surgery; and Inguinal hernia repair (Bilateral, 10/18/2018). Family His family history includes Breast cancer in his mother; Cancer in his maternal grandfather;  Dementia in his father; Heart attack (age of onset: 53) in his father; Prostate cancer in his cousin and maternal uncle; Ulcers in his maternal grandmother. Social history  He reports that he quit smoking about 7 months ago. His smoking use included cigarettes. He has a 30.00 pack-year smoking history. He has never used smokeless tobacco. He reports current alcohol use  of about 14.0 standard drinks of alcohol per week. He reports current drug use. Frequency: 7.00 times per week. Drug: Marijuana.  MEDICARE WELLNESS OBJECTIVES: Physical activity: Current Exercise Habits: Home exercise routine, Type of exercise: walking, Time (Minutes): 60, Frequency (Times/Week): 7, Weekly Exercise (Minutes/Week): 420, Intensity: Mild, Exercise limited by: None identified Cardiac risk factors: Cardiac Risk Factors include: advanced age (>44men, >8 women);dyslipidemia;hypertension;male gender;smoking/ tobacco exposure Depression/mood screen:   Depression screen [facility] 2/9 07/30/2019  Decreased Interest 0  Down, Depressed, Hopeless 0  PHQ - 2 Score 0    ADLs:  In your present state of health, do you have any difficulty performing the following activities: 07/30/2019 04/14/2019  Hearing? N N  Comment has hearing aids -  Vision? N N  Difficulty concentrating or making decisions? N N  Walking or climbing stairs? N N  Dressing or bathing? N N  Doing errands, shopping? N N  Some recent data might be hidden     Cognitive Testing  Alert? Yes  Normal Appearance?Yes  Oriented to person? Yes  Place? Yes   Time? Yes  Recall of three objects?  Yes  Can perform simple calculations? Yes  Displays appropriate judgment?Yes  Can read the correct time from a watch face?Yes  EOL planning: Does Patient Have a Medical Advance Directive?: No Does patient want to make changes to medical advance directive?: Yes (MAU/Ambulatory/Procedural Areas - Information given) Would patient like information on creating a medical advance  directive?: Yes (MAU/Ambulatory/Procedural Areas - Information given)   Review of Systems  Constitutional: Negative for malaise/fatigue and weight loss.  HENT: Negative for hearing loss and tinnitus.   Eyes: Negative for blurred vision and double vision.  Respiratory: Negative for cough, sputum production, shortness of breath and wheezing.   Cardiovascular: Negative for chest pain, palpitations, orthopnea, claudication, leg swelling and PND.  Gastrointestinal: Negative for abdominal pain, blood in stool, constipation, diarrhea, heartburn, melena, nausea and vomiting.  Genitourinary: Negative.   Musculoskeletal: Negative for falls, joint pain and myalgias.  Skin: Negative for rash.  Neurological: Positive for tremors (bil hands). Negative for dizziness, tingling, sensory change, weakness and headaches.  Endo/Heme/Allergies: Negative for polydipsia.  Psychiatric/Behavioral: Negative.  Negative for depression, memory loss, substance abuse and suicidal ideas. The patient is not nervous/anxious and does not have insomnia.   All other systems reviewed and are negative.    Objective:   Today's Vitals   07/30/19 1605  BP: 128/84  Pulse: 77  Temp: 97.7 F (36.5 C)  SpO2: 97%  Weight: 184 lb (83.5 kg)  Height: 6' (1.829 m)   Body mass index is 24.95 kg/m.  General appearance: alert, red through face/neck , no distress, WD/WN, male HEENT: normocephalic, sclerae anicteric, TMs pearly, nares patent, no discharge or erythema, pharynx normal, normal hearing with bilateral hearing aids Oral cavity: MMM, no lesions Neck: supple, no lymphadenopathy, no thyromegaly, no masses Heart: RRR, normal S1, S2, no murmurs Lungs: Symmetrical, clear, not diminished, no wheezes, rhonchi, or rales Abdomen: +bs, soft,  non distended,  No palpable organomegaly or hernias Musculoskeletal: No deformity, no swelling Extremities: no edema, no cyanosis, no clubbing Pulses: 2+ symmetric, upper and lower  extremities, normal cap refill Neurological: alert, oriented x 3, CN2-12 intact, strength normal upper extremities and lower extremities, sensation normal throughout, DTRs 2+ throughout, no cerebellar signs, gait normal. Resting tremor bilateral hands.  Psychiatric: normal affect, behavior normal, pleasant   Medicare Attestation I have personally reviewed: The patient's medical and social history Their use of alcohol,  tobacco or illicit drugs Their current medications and supplements The patient's functional ability including ADLs,fall risks, home safety risks, cognitive, and hearing and visual impairment Diet and physical activities Evidence for depression or mood disorders  The patient's weight, height, BMI, and visual acuity have been recorded in the chart.  I have made referrals, counseling, and provided education to the patient based on review of the above and I have provided the patient with a written personalized care plan for preventive services.     Izora Ribas, NP   07/30/2019

## 2019-07-30 ENCOUNTER — Encounter: Payer: Self-pay | Admitting: Adult Health

## 2019-07-30 ENCOUNTER — Ambulatory Visit: Payer: Medicare HMO | Admitting: Adult Health

## 2019-07-30 ENCOUNTER — Other Ambulatory Visit: Payer: Self-pay

## 2019-07-30 VITALS — BP 128/84 | HR 77 | Temp 97.7°F | Ht 72.0 in | Wt 184.0 lb

## 2019-07-30 DIAGNOSIS — Z85828 Personal history of other malignant neoplasm of skin: Secondary | ICD-10-CM | POA: Insufficient documentation

## 2019-07-30 DIAGNOSIS — R6889 Other general symptoms and signs: Secondary | ICD-10-CM

## 2019-07-30 DIAGNOSIS — F1010 Alcohol abuse, uncomplicated: Secondary | ICD-10-CM

## 2019-07-30 DIAGNOSIS — J432 Centrilobular emphysema: Secondary | ICD-10-CM | POA: Diagnosis not present

## 2019-07-30 DIAGNOSIS — I700 Atherosclerosis of aorta: Secondary | ICD-10-CM

## 2019-07-30 DIAGNOSIS — G4710 Hypersomnia, unspecified: Secondary | ICD-10-CM

## 2019-07-30 DIAGNOSIS — Z1159 Encounter for screening for other viral diseases: Secondary | ICD-10-CM

## 2019-07-30 DIAGNOSIS — N183 Chronic kidney disease, stage 3 unspecified: Secondary | ICD-10-CM

## 2019-07-30 DIAGNOSIS — Z6824 Body mass index (BMI) 24.0-24.9, adult: Secondary | ICD-10-CM

## 2019-07-30 DIAGNOSIS — E782 Mixed hyperlipidemia: Secondary | ICD-10-CM

## 2019-07-30 DIAGNOSIS — Z0001 Encounter for general adult medical examination with abnormal findings: Secondary | ICD-10-CM | POA: Diagnosis not present

## 2019-07-30 DIAGNOSIS — K219 Gastro-esophageal reflux disease without esophagitis: Secondary | ICD-10-CM | POA: Diagnosis not present

## 2019-07-30 DIAGNOSIS — I158 Other secondary hypertension: Secondary | ICD-10-CM | POA: Diagnosis not present

## 2019-07-30 DIAGNOSIS — G4730 Sleep apnea, unspecified: Secondary | ICD-10-CM

## 2019-07-30 DIAGNOSIS — Z720 Tobacco use: Secondary | ICD-10-CM

## 2019-07-30 DIAGNOSIS — Z87891 Personal history of nicotine dependence: Secondary | ICD-10-CM

## 2019-07-30 DIAGNOSIS — Z79899 Other long term (current) drug therapy: Secondary | ICD-10-CM

## 2019-07-30 DIAGNOSIS — Z8546 Personal history of malignant neoplasm of prostate: Secondary | ICD-10-CM

## 2019-07-30 DIAGNOSIS — G250 Essential tremor: Secondary | ICD-10-CM | POA: Insufficient documentation

## 2019-07-30 DIAGNOSIS — I10 Essential (primary) hypertension: Secondary | ICD-10-CM | POA: Diagnosis not present

## 2019-07-30 DIAGNOSIS — R7309 Other abnormal glucose: Secondary | ICD-10-CM

## 2019-07-30 DIAGNOSIS — E559 Vitamin D deficiency, unspecified: Secondary | ICD-10-CM

## 2019-07-30 DIAGNOSIS — Z0000 Encounter for general adult medical examination without abnormal findings: Secondary | ICD-10-CM

## 2019-07-30 NOTE — Patient Instructions (Addendum)
Juan Torres , Thank you for taking time to come for your Medicare Wellness Visit. I appreciate your ongoing commitment to your health goals. Please review the following plan we discussed and let me know if I can assist you in the future.   These are the goals we discussed: Goals    . Blood Pressure < 130/80       This is a list of the screening recommended for you and due dates:  Health Maintenance  Topic Date Due  .  Hepatitis C: One time screening is recommended by Center for Disease Control  (CDC) for  adults born from [birth year] through 1965.   Never done  . Flu Shot  09/08/2019  . Tetanus Vaccine  10/19/2021  . Colon Cancer Screening  12/01/2021  . COVID-19 Vaccine  Completed  . Pneumonia vaccines  Addressed     Call insurance to ask about shingrix vaccine coverage (2 shot vaccine for shingles, new, better) - can get at [facility] or other pharmacy that offers vaccines     Tremor A tremor is trembling or shaking that you cannot control. Most tremors affect the hands or arms. Tremors can also affect the head, vocal cords, face, and other parts of the body. There are many types of tremors. Common types include:  Essential tremor. These usually occur in people older than 40. It may run in families and can happen in otherwise healthy people.  Resting tremor. These occur when the muscles are at rest, such as when your hands are resting in your lap. People with Parkinson's disease often have resting tremors.  Postural tremor. These occur when you try to hold a pose, such as keeping your hands outstretched.  Kinetic tremor. These occur during purposeful movement, such as trying to touch a finger to your nose.  Task-specific tremor. These may occur when you perform certain tasks such as writing, speaking, or standing.  Psychogenic tremor. These dramatically lessen or disappear when you are distracted. They can happen in people of all ages. Some types of tremors have no known cause. Tremors can  also be a symptom of nervous system problems (neurological disorders) that may occur with aging. Some tremors go away with treatment, while others do not. Follow these instructions at home: Lifestyle      Limit alcohol intake to no more than 1 drink a day for nonpregnant women and 2 drinks a day for men. One drink equals 12 oz of beer, 5 oz of wine, or 1 oz of hard liquor.  Do not use any products that contain nicotine or tobacco, such as cigarettes and e-cigarettes. If you need help quitting, ask your health care provider.  Avoid extreme heat and extreme cold.  Limit your caffeine intake, as told by your health care provider.  Try to get 8 hours of sleep each night.  Find ways to manage your stress, such as meditation or yoga. General instructions  Take over-the-counter and prescription medicines only as told by your health care provider.  Keep all follow-up visits as told by your health care provider. This is important. Contact a health care provider if you:  Develop a tremor after starting a new medicine.  Have a tremor along with other symptoms such as: ? Numbness. ? Tingling. ? Pain. ? Weakness.  Notice that your tremor gets worse.  Notice that your tremor interferes with your day-to-day life. Summary  A tremor is trembling or shaking that you cannot control.  Most tremors affect the hands or  arms.  Some types of tremors have no known cause. Others may be a symptom of nervous system problems (neurological disorders).  Make sure you discuss any tremors you have with your health care provider. This information is not intended to replace advice given to you by your health care provider. Make sure you discuss any questions you have with your health care provider. Document Revised: 01/06/2017 Document Reviewed: 11/24/2016 Elsevier Patient Education  2020 [employer].

## 2019-07-31 ENCOUNTER — Other Ambulatory Visit: Payer: Self-pay | Admitting: Adult Health

## 2019-07-31 ENCOUNTER — Other Ambulatory Visit: Payer: Self-pay

## 2019-07-31 DIAGNOSIS — R7989 Other specified abnormal findings of blood chemistry: Secondary | ICD-10-CM

## 2019-07-31 DIAGNOSIS — F1010 Alcohol abuse, uncomplicated: Secondary | ICD-10-CM

## 2019-07-31 DIAGNOSIS — D7589 Other specified diseases of blood and blood-forming organs: Secondary | ICD-10-CM

## 2019-07-31 LAB — COMPLETE METABOLIC PANEL WITH GFR
AG Ratio: 2.1 (calc) (ref 1.0–2.5)
ALT: 53 U/L — ABNORMAL HIGH (ref 9–46)
AST: 71 U/L — ABNORMAL HIGH (ref 10–35)
Albumin: 4.8 g/dL (ref 3.6–5.1)
Alkaline phosphatase (APISO): 37 U/L (ref 35–144)
BUN/Creatinine Ratio: 12 (calc) (ref 6–22)
BUN: 16 mg/dL (ref 7–25)
CO2: 26 mmol/L (ref 20–32)
Calcium: 10.4 mg/dL — ABNORMAL HIGH (ref 8.6–10.3)
Chloride: 105 mmol/L (ref 98–110)
Creat: 1.29 mg/dL — ABNORMAL HIGH (ref 0.70–1.18)
GFR, Est African American: 63 mL/min/{1.73_m2} (ref >=60)
GFR, Est Non African American: 55 mL/min/{1.73_m2} — ABNORMAL LOW (ref >=60)
Globulin: 2.3 g/dL (calc) (ref 1.9–3.7)
Glucose, Bld: 101 mg/dL — ABNORMAL HIGH (ref 65–99)
Potassium: 4.6 mmol/L (ref 3.5–5.3)
Sodium: 144 mmol/L (ref 135–146)
Total Bilirubin: 0.7 mg/dL (ref 0.2–1.2)
Total Protein: 7.1 g/dL (ref 6.1–8.1)

## 2019-07-31 LAB — CBC WITH DIFFERENTIAL/PLATELET
Absolute Monocytes: 424 cells/uL (ref 200–950)
Basophils Absolute: 32 cells/uL (ref 0–200)
Basophils Relative: 0.8 %
Eosinophils Absolute: 40 cells/uL (ref 15–500)
Eosinophils Relative: 1 %
HCT: 41.1 % (ref 38.5–50.0)
Hemoglobin: 14.5 g/dL (ref 13.2–17.1)
Lymphs Abs: 912 cells/uL (ref 850–3900)
MCH: 37.6 pg — ABNORMAL HIGH (ref 27.0–33.0)
MCHC: 35.3 g/dL (ref 32.0–36.0)
MCV: 106.5 fL — ABNORMAL HIGH (ref 80.0–100.0)
MPV: 10.1 fL (ref 7.5–12.5)
Monocytes Relative: 10.6 %
Neutro Abs: 2592 cells/uL (ref 1500–7800)
Neutrophils Relative %: 64.8 %
Platelets: 145 10*3/uL (ref 140–400)
RBC: 3.86 10*6/uL — ABNORMAL LOW (ref 4.20–5.80)
RDW: 13 % (ref 11.0–15.0)
Total Lymphocyte: 22.8 %
WBC: 4 10*3/uL (ref 3.8–10.8)

## 2019-07-31 LAB — LIPID PANEL
Cholesterol: 240 mg/dL — ABNORMAL HIGH (ref <200)
HDL: 87 mg/dL (ref >=40)
LDL Cholesterol (Calc): 118 mg/dL (calc) — ABNORMAL HIGH
Non-HDL Cholesterol (Calc): 153 mg/dL (calc) — ABNORMAL HIGH (ref <130)
Total CHOL/HDL Ratio: 2.8 (calc) (ref <5.0)
Triglycerides: 228 mg/dL — ABNORMAL HIGH (ref <150)

## 2019-07-31 LAB — TSH: TSH: 2.28 mIU/L (ref 0.40–4.50)

## 2019-07-31 LAB — HEPATITIS C ANTIBODY
Hepatitis C Ab: NONREACTIVE
SIGNAL TO CUT-OFF: 0.01 (ref <1.00)

## 2019-07-31 LAB — MAGNESIUM: Magnesium: 1.7 mg/dL (ref 1.5–2.5)

## 2019-07-31 MED ORDER — EZETIMIBE 10 MG PO TABS
10.0000 mg | ORAL_TABLET | Freq: Every day | ORAL | 0 refills | Status: DC
Start: 2019-07-31 — End: 2019-11-27

## 2019-07-31 MED FILL — EZETIMIBE 10 MG TABS: 10 | 30 days supply | Qty: 30 | Fill #0

## 2019-08-15 MED FILL — PROPRANOLOL HCL ER 120 MG C: 120 | 90 days supply | Qty: 90 | Fill #2

## 2019-08-30 ENCOUNTER — Ambulatory Visit: Payer: Medicare HMO | Admitting: *Deleted

## 2019-08-30 ENCOUNTER — Other Ambulatory Visit: Payer: Self-pay

## 2019-08-30 DIAGNOSIS — F1010 Alcohol abuse, uncomplicated: Secondary | ICD-10-CM

## 2019-08-30 DIAGNOSIS — R7989 Other specified abnormal findings of blood chemistry: Secondary | ICD-10-CM

## 2019-08-30 DIAGNOSIS — D7589 Other specified diseases of blood and blood-forming organs: Secondary | ICD-10-CM

## 2019-08-30 NOTE — Progress Notes (Signed)
Patient is here for a [place] to recheck labs. He states he has increased his fluid intake and drinks a lot of Gatorade. He denies taking NSAIDS. States he does not eat as much as he used to.

## 2019-09-02 ENCOUNTER — Other Ambulatory Visit: Payer: Self-pay | Admitting: Adult Health

## 2019-09-02 DIAGNOSIS — R7989 Other specified abnormal findings of blood chemistry: Secondary | ICD-10-CM

## 2019-09-02 DIAGNOSIS — D649 Anemia, unspecified: Secondary | ICD-10-CM

## 2019-09-02 DIAGNOSIS — Z130 Encounter for screening for diseases of the blood and blood-forming organs and certain disorders involving the immune mechanism: Secondary | ICD-10-CM

## 2019-09-03 LAB — IRON,TIBC AND FERRITIN PANEL
Ferritin: 1580 ng/mL — ABNORMAL HIGH (ref 24–380)
Iron: 212 ug/dL — ABNORMAL HIGH (ref 50–180)

## 2019-09-03 LAB — GAMMA GT: GGT: 62 U/L (ref 3–70)

## 2019-09-03 LAB — HEPATIC FUNCTION PANEL
AG Ratio: 2.2 (calc) (ref 1.0–2.5)
ALT: 64 U/L — ABNORMAL HIGH (ref 9–46)
AST: 93 U/L — ABNORMAL HIGH (ref 10–35)
Albumin: 4.3 g/dL (ref 3.6–5.1)
Alkaline phosphatase (APISO): 35 U/L (ref 35–144)
Bilirubin, Direct: 0.2 mg/dL (ref 0.0–0.2)
Globulin: 2 g/dL (calc) (ref 1.9–3.7)
Indirect Bilirubin: 0.5 mg/dL (calc) (ref 0.2–1.2)
Total Bilirubin: 0.7 mg/dL (ref 0.2–1.2)
Total Protein: 6.3 g/dL (ref 6.1–8.1)

## 2019-09-03 LAB — VITAMIN B1: Vitamin B1 (Thiamine): 9 nmol/L (ref 8–30)

## 2019-09-09 MED FILL — ROSUVASTATIN CALCIUM 40 MG: 40 | 90 days supply | Qty: 90 | Fill #3

## 2019-09-16 ENCOUNTER — Ambulatory Visit
Admission: RE | Admit: 2019-09-16 | Discharge: 2019-09-16 | Disposition: A | Discharge status: Home / Self Care | Payer: Medicare HMO | Source: Ambulatory Visit | Attending: Adult Health | Admitting: Adult Health

## 2019-09-16 DIAGNOSIS — R7989 Other specified abnormal findings of blood chemistry: Secondary | ICD-10-CM

## 2019-09-18 ENCOUNTER — Encounter: Payer: Self-pay | Admitting: Adult Health

## 2019-09-18 DIAGNOSIS — K760 Fatty (change of) liver, not elsewhere classified: Secondary | ICD-10-CM | POA: Insufficient documentation

## 2019-09-18 MED FILL — EZETIMIBE 10 MG TABS: 10 | 30 days supply | Qty: 30 | Fill #1

## 2019-09-23 ENCOUNTER — Other Ambulatory Visit: Payer: Self-pay

## 2019-09-23 ENCOUNTER — Ambulatory Visit (INDEPENDENT_AMBULATORY_CARE_PROVIDER_SITE_OTHER): Payer: Medicare HMO

## 2019-09-23 DIAGNOSIS — D649 Anemia, unspecified: Secondary | ICD-10-CM

## 2019-09-23 DIAGNOSIS — R7989 Other specified abnormal findings of blood chemistry: Secondary | ICD-10-CM

## 2019-09-23 NOTE — Progress Notes (Signed)
Patient presents to the office for a nurse visit to have a liver panel done. No questions or concerns. Vitals taken and recorded.

## 2019-10-02 LAB — HEPATIC FUNCTION PANEL
AG Ratio: 2.2 (calc) (ref 1.0–2.5)
ALT: 21 U/L (ref 9–46)
AST: 23 U/L (ref 10–35)
Albumin: 4.1 g/dL (ref 3.6–5.1)
Alkaline phosphatase (APISO): 31 U/L — ABNORMAL LOW (ref 35–144)
Bilirubin, Direct: 0.1 mg/dL (ref 0.0–0.2)
Globulin: 1.9 g/dL (calc) (ref 1.9–3.7)
Indirect Bilirubin: 0.5 mg/dL (calc) (ref 0.2–1.2)
Total Bilirubin: 0.6 mg/dL (ref 0.2–1.2)
Total Protein: 6 g/dL — ABNORMAL LOW (ref 6.1–8.1)

## 2019-10-02 LAB — HEPATITIS PANEL, ACUTE
Hep A IgM: NONREACTIVE
Hep B C IgM: NONREACTIVE
Hepatitis B Surface Ag: NONREACTIVE
Hepatitis C Ab: NONREACTIVE
SIGNAL TO CUT-OFF: 0.01 (ref <1.00)

## 2019-10-02 LAB — FOLATE RBC: RBC Folate: 679 ng/mL RBC (ref >280)

## 2019-10-02 LAB — HEMOCHROMATOSIS DNA-PCR(C282Y,H63D)

## 2019-10-03 ENCOUNTER — Encounter: Payer: Self-pay | Admitting: Adult Health

## 2019-10-03 DIAGNOSIS — R7989 Other specified abnormal findings of blood chemistry: Secondary | ICD-10-CM | POA: Insufficient documentation

## 2019-10-24 MED FILL — FENOFIBRATE 134 MG CAPSULE: 134 | 90 days supply | Qty: 90 | Fill #2

## 2019-10-24 MED FILL — EZETIMIBE 10 MG TABS: 10 | 30 days supply | Qty: 30 | Fill #2

## 2019-10-29 NOTE — Patient Instructions (Signed)

## 2019-10-29 NOTE — Progress Notes (Signed)
History of Present Illness:       This very nice 74 y.o.  MWM presents for 3 month follow up with HTN, HLD, Pre-Diabetes and Vitamin D Deficiency.  Patient has had recent elevation of LFT's and screening tests showed Negative Hep A, B & C. Serum iron studies & Ferritin levels were elevated.  Liver u/S showed mild steatosis. 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 Iron levels were elevated  (212) as was Ferritin 1,580.  Hemochromatosis DNA-PCR(c282y,h63d was negative.       Patient is treated for HTN (1998) & BP has been controlled at home. Today's BP is high normal - at goal - 140/80.  Patient also has hx/o CKD 2. Patient has had no complaints of any cardiac type chest pain, palpitations, dyspnea / orthopnea / PND, dizziness, claudication, or dependent edema.       Hyperlipidemia is controlled with diet & meds. Patient denies myalgias or other med SE's. Last Lipids were   Lab Results  Component Value Date   CHOL 240 (H) 07/30/2019   HDL 87 07/30/2019   LDLCALC 118 (H) 07/30/2019   TRIG 228 (H) 07/30/2019   CHOLHDL 2.8 07/30/2019    Also, the patient has history of PreDiabetes (A1c 5.7% /2014)  and has had no symptoms of reactive hypoglycemia, diabetic polys, paresthesias or visual blurring.  Last A1c was at goal:  Lab Results  Component Value Date   HGBA1C 5.3 04/15/2019       Further, the patient also has history of Vitamin D Deficiency ("13" /2013) and supplements vitamin D without any suspected side-effects. Last vitamin D was still low (goal 70-100):  Lab Results  Component Value Date   VD25OH 36 04/15/2019    Current Outpatient Medications on File Prior to Visit  Medication Sig  . B Complex Vitamins (VITAMIN B-COMPLEX PO) Take 1 tablet by mouth daily.  . Cholecalciferol (VITAMIN D3) 125 MCG (5000 UT) TABS Take 15,000 Units by mouth every morning.  Marland Kitchen esomeprazole (NEXIUM) 20 MG capsule Take 1 capsule  daily for Heartburn (Patient taking differently: Take 20 mg by mouth every evening. )  . ezetimibe (ZETIA) 10 MG tablet Take 1 tablet (10 mg total) by mouth daily.  . fenofibrate micronized (LOFIBRA) 134 MG capsule Take 1 capsule Daily for Triglycerides (Blood Fats)  . Magnesium 400 MG CAPS Take 400 mg by mouth every evening.   . Omega-3 Fatty Acids (FISH OIL) 1000 MG CAPS Take 1,000 mg by mouth every morning.  . propranolol ER (INDERAL LA) 120 MG 24 hr capsule Take 1 capsule every Morning for BP & tremor  . rosuvastatin (CRESTOR) 40 MG tablet Take 1 tablet Daily for Cholesterol  . vitamin E 1000 UNIT capsule Take 3,000 Units by mouth every morning.   Marland Kitchen aspirin 81 MG tablet Take 81 mg by mouth every other day.  (Patient not taking: Reported on 10/30/2019)  . diazepam (VALIUM) 5 MG tablet Take 1/2 to 1 tablet 3 x /day for Tremor (Patient not taking: Reported on 10/30/2019)    Allergies  Allergen Reactions  . Lipitor [Atorvastatin] Other (See Comments)    fatigued  . Niacin And Related Other (See Comments)    Flushing  . Penicillins Rash    PMHx:   Past Medical History:  Diagnosis Date  . Adenomatous colon polyp   . GERD (gastroesophageal reflux disease)   . History of basal cell cancer 2012  .  Hyperlipidemia   . Hypertension   . Prediabetes   . Prostate cancer ([place]) DX 12/19/11   bx=Adenocarcinoma,gleason=3+3=6,volume=42cc,PSA=4.63  . Pulmonary nodules 12/10/2018   Numerous unchanged/benign appearing per CT lung 12/2018  . Vitamin D deficiency     Immunization History  Administered Date(s) Administered  . DT (Pediatric) 10/20/2011  . PFIZER SARS-COV-2 Vaccination 04/04/2019, 04/30/2019  . Pneumococcal Conjugate-13 11/14/2017  . Pneumococcal Polysaccharide-23 04/28/2016  . Zoster 10/20/2011    Past Surgical History:  Procedure Laterality Date  . COLONOSCOPY W/ POLYPECTOMY     Adenomatous  . CYSTOSCOPY N/A 04/04/2012   Procedure: CYSTOSCOPY;  Surgeon: Molli Hazard, MD;  Location: [facility];  Service: Urology;  Laterality: N/A;  . EYE SURGERY     bilateral cataracts   . INGUINAL HERNIA REPAIR Bilateral 10/18/2018   Procedure: LAPAROSCOPIC BILATERAL INGUINAL HERNIA REPAIR;  Surgeon: Johnathan Hausen, MD;  Location: [facility];  Service: General;  Laterality: Bilateral;  . PROSTATE BIOPSY  12/19/2011   Procedure: BIOPSY TRANSRECTAL ULTRASONIC PROSTATE (TUBP);  Surgeon: Molli Hazard, MD;  Location: [facility];  Service: Urology;  Laterality: N/A;    . RADIOACTIVE SEED IMPLANT N/A 04/04/2012   Procedure: RADIOACTIVE SEED IMPLANT;  Surgeon: Molli Hazard, MD;  Location: [facility];  Service: Urology;  Laterality: N/A;  Seeds Implanted     72  Seeds Found in Bladder     None     FHx:    Reviewed / unchanged  SHx:    Reviewed / unchanged   Systems Review:  Constitutional: Denies fever, chills, wt changes, headaches, insomnia, fatigue, night sweats, change in appetite. Eyes: Denies redness, blurred vision, diplopia, discharge, itchy, watery eyes.  ENT: Denies discharge, congestion, post nasal drip, epistaxis, sore throat, earache, hearing loss, dental pain, tinnitus, vertigo, sinus pain, snoring.  CV: Denies chest pain, palpitations, irregular heartbeat, syncope, dyspnea, diaphoresis, orthopnea, PND, claudication or edema. Respiratory: denies cough, dyspnea, DOE, pleurisy, hoarseness, laryngitis, wheezing.  Gastrointestinal: Denies dysphagia, odynophagia, heartburn, reflux, water brash, abdominal pain or cramps, nausea, vomiting, bloating, diarrhea, constipation, hematemesis, melena, hematochezia  or hemorrhoids. Genitourinary: Denies dysuria, frequency, urgency, nocturia, hesitancy, discharge, hematuria or flank pain. Musculoskeletal: Denies arthralgias, myalgias, stiffness, jt. swelling, pain, limping or strain/sprain.  Skin: Denies pruritus, rash, hives, warts, acne, eczema or change in  skin lesion(s). Neuro: No weakness, tremor, incoordination, spasms, paresthesia or pain. Psychiatric: Denies confusion, memory loss or sensory loss. Endo: Denies change in weight, skin or hair change.  Heme/Lymph: No excessive bleeding, bruising or enlarged lymph nodes.  Physical Exam  BP 140/80   Pulse (!) 51   Temp (!) 97.5 F (36.4 C)   Ht 6' (1.829 m)   Wt 188 lb (85.3 kg)   SpO2 97%   BMI 25.50 kg/m   Appears  well nourished, well groomed  and in no distress.  Eyes: PERRLA, EOMs, conjunctiva no swelling or erythema. Sinuses: No frontal/maxillary tenderness ENT/Mouth: EAC's clear, TM's nl w/o erythema, bulging. Nares clear w/o erythema, swelling, exudates. Oropharynx clear without erythema or exudates. Oral hygiene is good. Tongue normal, non obstructing. Hearing intact.  Neck: Supple. Thyroid not palpable. Car 2+/2+ without bruits, nodes or JVD. Chest: Respirations nl with BS clear & equal w/o rales, rhonchi, wheezing or stridor.  Cor: Heart sounds normal w/ regular rate and rhythm without sig. murmurs, gallops, clicks or rubs. Peripheral pulses normal and equal  without edema.  Abdomen: Soft & bowel sounds normal. Non-tender w/o guarding, rebound, hernias,  masses or organomegaly.  Lymphatics: Unremarkable.  Musculoskeletal: Full ROM all peripheral extremities, joint stability, 5/5 strength and normal gait.  Skin: Warm, dry without exposed rashes, lesions or ecchymosis apparent.  Neuro: Cranial nerves intact, reflexes equal bilaterally. Sensory-motor testing grossly intact. Tendon reflexes grossly intact.  Pysch: Alert & oriented x 3.  Insight and judgement nl & appropriate. No ideations.  Assessment and Plan:  1. Essential hypertension  - Continue medication, monitor blood pressure at home.  - Continue DASH diet.  Reminder to go to the ER if any CP,  SOB, nausea, dizziness, severe HA, changes vision/speech.  - CBC with Differential/Platelet - COMPLETE METABOLIC PANEL  WITH GFR - Magnesium - TSH  2. Hyperlipidemia, mixed  - Continue diet/meds, exercise,& lifestyle modifications.  - Continue monitor periodic cholesterol/liver & renal functions   - Lipid panel - TSH  3. Abnormal glucose  - Hemoglobin A1c - Insulin, random  4. Vitamin D deficiency  - Continue diet, exercise  - Lifestyle modifications.  - Monitor appropriate labs. - Continue supplementation.  - VITAMIN D 25 Hydroxy  5. Elevated LFTs  - COMPLETE METABOLIC PANEL WITH GFR  6. Iron overload  - Iron,Total/Total Iron Binding Cap - Ferritin  7. Medication management  - Iron,Total/Total Iron Binding Cap - Ferritin - CBC with Differential/Platelet - Magnesium - Lipid panel - TSH - Hemoglobin A1c - Insulin, random - VITAMIN D 25 Hydroxy         Discussed  regular exercise, BP monitoring, weight control to achieve/maintain BMI less than 25 and discussed med and SE's. Recommended labs to assess and monitor clinical status with further disposition pending results of labs.  I discussed the assessment and treatment plan with the patient. The patient was provided an opportunity to ask questions and all were answered. The patient agreed with the plan and demonstrated an understanding of the instructions.  I provided over 30 minutes of exam, counseling, chart review and  complex critical decision making.  Kirtland Bouchard, MD

## 2019-10-30 ENCOUNTER — Encounter: Payer: Self-pay | Admitting: Internal Medicine

## 2019-10-30 ENCOUNTER — Other Ambulatory Visit: Payer: Self-pay

## 2019-10-30 ENCOUNTER — Ambulatory Visit: Payer: Medicare HMO | Admitting: Internal Medicine

## 2019-10-30 VITALS — BP 140/80 | HR 51 | Temp 97.5°F | Ht 72.0 in | Wt 188.0 lb

## 2019-10-30 DIAGNOSIS — E559 Vitamin D deficiency, unspecified: Secondary | ICD-10-CM | POA: Diagnosis not present

## 2019-10-30 DIAGNOSIS — I10 Essential (primary) hypertension: Secondary | ICD-10-CM

## 2019-10-30 DIAGNOSIS — R7989 Other specified abnormal findings of blood chemistry: Secondary | ICD-10-CM

## 2019-10-30 DIAGNOSIS — E782 Mixed hyperlipidemia: Secondary | ICD-10-CM | POA: Diagnosis not present

## 2019-10-30 DIAGNOSIS — Z79899 Other long term (current) drug therapy: Secondary | ICD-10-CM

## 2019-10-30 DIAGNOSIS — R7309 Other abnormal glucose: Secondary | ICD-10-CM | POA: Diagnosis not present

## 2019-10-31 ENCOUNTER — Ambulatory Visit: Payer: Medicare HMO | Admitting: Adult Health

## 2019-10-31 LAB — CBC WITH DIFFERENTIAL/PLATELET
Absolute Monocytes: 588 cells/uL (ref 200–950)
Basophils Absolute: 42 cells/uL (ref 0–200)
Basophils Relative: 0.7 %
Eosinophils Absolute: 102 cells/uL (ref 15–500)
Eosinophils Relative: 1.7 %
HCT: 46.6 % (ref 38.5–50.0)
Hemoglobin: 16.1 g/dL (ref 13.2–17.1)
Lymphs Abs: 1434 cells/uL (ref 850–3900)
MCH: 36.2 pg — ABNORMAL HIGH (ref 27.0–33.0)
MCHC: 34.5 g/dL (ref 32.0–36.0)
MCV: 104.7 fL — ABNORMAL HIGH (ref 80.0–100.0)
MPV: 10.8 fL (ref 7.5–12.5)
Monocytes Relative: 9.8 %
Neutro Abs: 3834 cells/uL (ref 1500–7800)
Neutrophils Relative %: 63.9 %
Platelets: 205 10*3/uL (ref 140–400)
RBC: 4.45 10*6/uL (ref 4.20–5.80)
RDW: 11.6 % (ref 11.0–15.0)
Total Lymphocyte: 23.9 %
WBC: 6 10*3/uL (ref 3.8–10.8)

## 2019-10-31 LAB — COMPLETE METABOLIC PANEL WITH GFR
AG Ratio: 2.2 (calc) (ref 1.0–2.5)
ALT: 11 U/L (ref 9–46)
AST: 15 U/L (ref 10–35)
Albumin: 4.3 g/dL (ref 3.6–5.1)
Alkaline phosphatase (APISO): 33 U/L — ABNORMAL LOW (ref 35–144)
BUN/Creatinine Ratio: 10 (calc) (ref 6–22)
BUN: 16 mg/dL (ref 7–25)
CO2: 25 mmol/L (ref 20–32)
Calcium: 10.1 mg/dL (ref 8.6–10.3)
Chloride: 106 mmol/L (ref 98–110)
Creat: 1.56 mg/dL — ABNORMAL HIGH (ref 0.70–1.18)
GFR, Est African American: 50 mL/min/{1.73_m2} — ABNORMAL LOW (ref >=60)
GFR, Est Non African American: 43 mL/min/{1.73_m2} — ABNORMAL LOW (ref >=60)
Globulin: 2 g/dL (calc) (ref 1.9–3.7)
Glucose, Bld: 113 mg/dL — ABNORMAL HIGH (ref 65–99)
Potassium: 4.8 mmol/L (ref 3.5–5.3)
Sodium: 140 mmol/L (ref 135–146)
Total Bilirubin: 0.6 mg/dL (ref 0.2–1.2)
Total Protein: 6.3 g/dL (ref 6.1–8.1)

## 2019-10-31 LAB — HEMOGLOBIN A1C
Hgb A1c MFr Bld: 5.2 % of total Hgb (ref <5.7)
Mean Plasma Glucose: 103 (calc)
eAG (mmol/L): 5.7 (calc)

## 2019-10-31 LAB — LIPID PANEL
Cholesterol: 187 mg/dL (ref <200)
HDL: 48 mg/dL (ref >=40)
LDL Cholesterol (Calc): 112 mg/dL (calc) — ABNORMAL HIGH
Non-HDL Cholesterol (Calc): 139 mg/dL (calc) — ABNORMAL HIGH (ref <130)
Total CHOL/HDL Ratio: 3.9 (calc) (ref <5.0)
Triglycerides: 152 mg/dL — ABNORMAL HIGH (ref <150)

## 2019-10-31 LAB — IRON, TOTAL/TOTAL IRON BINDING CAP
%SAT: 23 % (calc) (ref 20–48)
Iron: 89 ug/dL (ref 50–180)
TIBC: 388 mcg/dL (calc) (ref 250–425)

## 2019-10-31 LAB — INSULIN, RANDOM: Insulin: 13.1 u[IU]/mL

## 2019-10-31 LAB — TSH: TSH: 2.51 mIU/L (ref 0.40–4.50)

## 2019-10-31 LAB — VITAMIN D 25 HYDROXY (VIT D DEFICIENCY, FRACTURES): Vit D, 25-Hydroxy: 58 ng/mL (ref 30–100)

## 2019-10-31 LAB — FERRITIN: Ferritin: 270 ng/mL (ref 24–380)

## 2019-10-31 LAB — MAGNESIUM: Magnesium: 2.1 mg/dL (ref 1.5–2.5)

## 2019-10-31 NOTE — Progress Notes (Signed)
==========================================================   -    Iron levels are back  to Normal - Great  ==========================================================   -  Kidney functions still look a little Dehydrated  -   So it's very important  to drink a lot more fluids to Prevent Permanent  Kidney damage  - Recommend drink at least 5 to 6 bottles (16 oz) of water or fluids / day  Also important to avoid NSAID type meds like Ibuprofen or Naprosyn ==========================================================   -  Chol = 187 -elevated   -and LDL Chol = 112 -too high  - Cholesterol is too high - Recommend low cholesterol diet   - Cholesterol only comes from animal sources  - ie. meat, dairy, egg yolks  - Eat all the vegetables you want.  - Avoid meat, especially red meat - Beef AND Pork .  - Avoid cheese & dairy - milk & ice cream.     - Cheese is the most concentrated form of trans-fats which  is the worst thing to clog up our arteries.   - Veggie cheese is OK which can be found in the fresh  produce section at [employer] or [employer] or [employer] ==========================================================   - A1c - Normal - Great - No Diabetes ==========================================================   -  Vitamin D = 58 - Great  ==========================================================   -

## 2019-11-05 ENCOUNTER — Other Ambulatory Visit: Payer: Self-pay | Admitting: Internal Medicine

## 2019-11-05 MED FILL — AZITHROMYCIN 250 MG TABS: 250 | 5 days supply | Qty: 6 | Fill #0

## 2019-11-06 ENCOUNTER — Telehealth: Payer: Self-pay | Admitting: Internal Medicine

## 2019-11-06 ENCOUNTER — Other Ambulatory Visit: Payer: Self-pay | Admitting: Internal Medicine

## 2019-11-06 ENCOUNTER — Ambulatory Visit (HOSPITAL_COMMUNITY)
Admission: RE | Admit: 2019-11-06 | Discharge: 2019-11-06 | Disposition: A | Discharge status: Home / Self Care | Payer: Medicare Other | Source: Ambulatory Visit | Attending: Pulmonary Disease | Admitting: Pulmonary Disease

## 2019-11-06 DIAGNOSIS — J432 Centrilobular emphysema: Secondary | ICD-10-CM

## 2019-11-06 DIAGNOSIS — U071 COVID-19: Secondary | ICD-10-CM | POA: Diagnosis present

## 2019-11-06 DIAGNOSIS — I10 Essential (primary) hypertension: Secondary | ICD-10-CM

## 2019-11-06 DIAGNOSIS — N183 Chronic kidney disease, stage 3 unspecified: Secondary | ICD-10-CM | POA: Insufficient documentation

## 2019-11-06 DIAGNOSIS — Z23 Encounter for immunization: Secondary | ICD-10-CM | POA: Diagnosis not present

## 2019-11-06 MED ORDER — METHYLPREDNISOLONE SODIUM SUCC 125 MG IJ SOLR: 125.0000 mg | Freq: Once | INTRAMUSCULAR | Status: DC | PRN

## 2019-11-06 MED ORDER — EPINEPHRINE 0.3 MG/0.3ML IJ SOAJ: 0.3000 mg | Freq: Once | INTRAMUSCULAR | Status: DC | PRN

## 2019-11-06 MED ORDER — SODIUM CHLORIDE 0.9 % IV SOLN: INTRAVENOUS | Status: DC | PRN

## 2019-11-06 MED ORDER — ALBUTEROL SULFATE HFA 108 (90 BASE) MCG/ACT IN AERS: 2.0000 | INHALATION_SPRAY | Freq: Once | RESPIRATORY_TRACT | Status: DC | PRN

## 2019-11-06 MED ORDER — FAMOTIDINE IN NACL 20-0.9 MG/50ML-% IV SOLN: 20.0000 mg | Freq: Once | INTRAVENOUS | Status: DC | PRN

## 2019-11-06 MED ORDER — SODIUM CHLORIDE 0.9 % IV SOLN
1200.0000 mg | Freq: Once | INTRAVENOUS | Status: AC
  Administered 2019-11-06: 1200 mg via INTRAVENOUS

## 2019-11-06 MED ORDER — DIPHENHYDRAMINE HCL 50 MG/ML IJ SOLN: 50.0000 mg | Freq: Once | INTRAMUSCULAR | Status: DC | PRN

## 2019-11-06 NOTE — Progress Notes (Signed)
I connected by phone with Juan Torres on 11/06/2019 at 12:51 PM to discuss the potential use of a new treatment for mild to moderate COVID-19 viral infection in non-hospitalized patients.  This patient is a 74 y.o. male that meets the FDA criteria for Emergency Use Authorization of COVID monoclonal antibody casirivimab/imdevimab or bamlanivimab/eteseviamb.  Has a (+) direct SARS-CoV-2 viral test result  Has mild or moderate COVID-19   Is NOT hospitalized due to COVID-19  Is within 10 days of symptom onset  Has at least one of the high risk factor(s) for progression to severe COVID-19 and/or hospitalization as defined in EUA.  Specific high risk criteria : Chronic Kidney Disease (CKD), Cardiovascular disease or hypertension and Chronic Lung Disease   I have spoken and communicated the following to the patient or parent/caregiver regarding COVID monoclonal antibody treatment:  1. FDA has authorized the emergency use for the treatment of mild to moderate COVID-19 in adults and pediatric patients with positive results of direct SARS-CoV-2 viral testing who are 4 years of age and older weighing at least 40 kg, and who are at high risk for progressing to severe COVID-19 and/or hospitalization.  2. The significant known and potential risks and benefits of COVID monoclonal antibody, and the extent to which such potential risks and benefits are unknown.  3. Information on available alternative treatments and the risks and benefits of those alternatives, including clinical trials.  4. Patients treated with COVID monoclonal antibody should continue to self-isolate and use infection control measures (e.g., wear mask, isolate, social distance, avoid sharing personal items, clean and disinfect "high touch" surfaces, and frequent handwashing) according to CDC guidelines.   5. The patient or parent/caregiver has the option to accept or refuse COVID monoclonal antibody treatment.  After reviewing this  information with the patient, the patient has agreed to receive one of the available covid 19 monoclonal antibodies and will be provided an appropriate fact sheet prior to infusion. Juan November, MD 11/06/2019 12:51 PM

## 2019-11-06 NOTE — Discharge Instructions (Signed)

## 2019-11-06 NOTE — Progress Notes (Signed)
  Diagnosis: COVID-19  Physician:Dr Joya Gaskins   Procedure: [facility] Med: casirivimab\imdevimab infusion - Provided patient with casirivimab\imdevimab fact sheet for patients, parents and caregivers prior to infusion.  Complications: No immediate complications noted.  Discharge: Discharged home   Juan Torres 11/06/2019

## 2019-11-06 NOTE — Telephone Encounter (Signed)
Patient started with symptoms approximately 4 days ago: Chills, nose congestion, cough. Went to [place] [facility], rapid test negative but then PCR was positive. The patient is  schedul for infusion today at 4:30 PM. He continue with achiness, cough and runny nose. I asked to try to get a copy of the PCR testing.

## 2019-11-07 ENCOUNTER — Other Ambulatory Visit (HOSPITAL_COMMUNITY): Payer: Self-pay

## 2019-11-07 ENCOUNTER — Ambulatory Visit (HOSPITAL_COMMUNITY): Payer: Medicare HMO

## 2019-11-18 ENCOUNTER — Ambulatory Visit: Payer: Medicare HMO | Admitting: Adult Health

## 2019-11-27 ENCOUNTER — Other Ambulatory Visit: Payer: Self-pay | Admitting: Internal Medicine

## 2019-11-27 ENCOUNTER — Other Ambulatory Visit: Payer: Self-pay | Admitting: Adult Health

## 2019-11-27 MED FILL — PROPRANOLOL HCL ER 120 MG C: 120 | 90 days supply | Qty: 90 | Fill #3

## 2019-11-27 MED FILL — EZETIMIBE 10 MG TABS: 10 | 90 days supply | Qty: 90 | Fill #0

## 2019-12-16 ENCOUNTER — Other Ambulatory Visit: Payer: Self-pay | Admitting: Internal Medicine

## 2019-12-16 ENCOUNTER — Other Ambulatory Visit: Payer: Self-pay | Admitting: Adult Health Nurse Practitioner

## 2019-12-16 DIAGNOSIS — E782 Mixed hyperlipidemia: Secondary | ICD-10-CM

## 2019-12-16 MED FILL — ROSUVASTATIN CALCIUM 40 MG: 40 | 90 days supply | Qty: 90 | Fill #0

## 2020-01-07 ENCOUNTER — Other Ambulatory Visit: Payer: Self-pay | Admitting: Adult Health

## 2020-01-09 ENCOUNTER — Ambulatory Visit
Admission: RE | Admit: 2020-01-09 | Discharge: 2020-01-09 | Disposition: A | Discharge status: Home / Self Care | Payer: Medicare Other | Source: Ambulatory Visit | Attending: Adult Health | Admitting: Adult Health

## 2020-01-09 DIAGNOSIS — Z720 Tobacco use: Secondary | ICD-10-CM

## 2020-01-30 ENCOUNTER — Other Ambulatory Visit: Payer: Self-pay

## 2020-01-30 ENCOUNTER — Ambulatory Visit (INDEPENDENT_AMBULATORY_CARE_PROVIDER_SITE_OTHER): Payer: Medicare HMO | Admitting: Adult Health Nurse Practitioner

## 2020-01-30 ENCOUNTER — Encounter: Payer: Self-pay | Admitting: Adult Health Nurse Practitioner

## 2020-01-30 VITALS — BP 140/80 | Temp 97.6°F | Wt 198.0 lb

## 2020-01-30 DIAGNOSIS — I10 Essential (primary) hypertension: Secondary | ICD-10-CM | POA: Diagnosis not present

## 2020-01-30 DIAGNOSIS — I700 Atherosclerosis of aorta: Secondary | ICD-10-CM | POA: Diagnosis not present

## 2020-01-30 DIAGNOSIS — G4730 Sleep apnea, unspecified: Secondary | ICD-10-CM

## 2020-01-30 DIAGNOSIS — Z79899 Other long term (current) drug therapy: Secondary | ICD-10-CM

## 2020-01-30 DIAGNOSIS — E559 Vitamin D deficiency, unspecified: Secondary | ICD-10-CM

## 2020-01-30 DIAGNOSIS — J432 Centrilobular emphysema: Secondary | ICD-10-CM | POA: Diagnosis not present

## 2020-01-30 DIAGNOSIS — R0683 Snoring: Secondary | ICD-10-CM

## 2020-01-30 DIAGNOSIS — R7309 Other abnormal glucose: Secondary | ICD-10-CM

## 2020-01-30 DIAGNOSIS — G4710 Hypersomnia, unspecified: Secondary | ICD-10-CM

## 2020-01-30 DIAGNOSIS — E782 Mixed hyperlipidemia: Secondary | ICD-10-CM

## 2020-01-30 DIAGNOSIS — K219 Gastro-esophageal reflux disease without esophagitis: Secondary | ICD-10-CM

## 2020-01-30 DIAGNOSIS — N183 Chronic kidney disease, stage 3 unspecified: Secondary | ICD-10-CM

## 2020-01-30 NOTE — Progress Notes (Addendum)
FOLLOW UP 3 MONTH  Assessment:   Juan Torres was seen today for medicare wellness and follow-up.  Diagnoses and all orders for this visit:  Atherosclerosis of aorta Per CT 2019 Control blood pressure, cholesterol, glucose, increase exercise.   Centrilobular emphysema Mild, by imaging; patient denies sx; patient did quit smoking  Essential hypertension Continue medication Monitor blood pressure at home; call if consistently over 130/80 Continue DASH diet.   Reminder to go to the ER if any CP, SOB, nausea, dizziness, severe HA, changes vision/speech, left arm numbness and tingling and jaw pain. -     CMP WITH GFR  Mixed hyperlipidemia -     Continue medication: rosuvastatin 40 mg, fenofibrate 134 mg  -     LDL at goal; lifestyle for trigs discussed -     CMP/GFR -     TSH -     Lipid panel  Other abnormal glucose       -     Recent A1Cs at goal Discussed diet/exercise, weight management  Defer A1C; check CMP  Vitamin D deficiency/ osteoporosis prophylaxis -     Below goal at recent check;  -     continue to recommend supplementation for goal of 60-100 -     Defer vitamin D level -     He has not increased dose; will increase to 8000 IU daily   Gastroesophageal reflux disease, esophagitis presence not specified -     Continue medications: nexium -     Discussed taper off PPI, patient prefers to defer at this time -     CBC with Differential/Platelet -     CMP/GFR  CKD III       -      Increase fluids, avoid NSAIDS, monitor sugars, will monitor      -      CMP/GFR  Snoring/hypersomnia with sleep apnea Mild per sleep study 2020 by Dr. Brett Fairy On CPAP, stopped alcohol   Medication Managament Continued  Over 30 minutes of face to face interview,  exam, counseling, chart review, and critical decision making was performed   Future Appointments  Date Time Provider [place]  02/10/2020 11:00 AM Ward Givens, NP [facility] None  04/20/2020  9:00 AM Unk Pinto, MD [facility] None  08/04/2020  3:00 PM Liane Comber, NP [facility] None      Subjective:  Juan Torres is a 74 y.o. male who presents for 3 month follow up for HTN, hyperlipidemia, prediabetes, and vitamin D Def.    About 6-7 month ago he stopped drinking ETOH and smoking all together.  Reports he is doing well with this.  In 10/2018 he underwent bilateral inguinal hernia repair by Dr. Hassell Done and has done well without complications.   He additionally reports snoring, wife has observed episodes of apnea, sleep study in 10/2018 found mild sleep apnea, hx of alcohol abuse, was recommended alcohol reduction vs CPAP,reports wearing CPAP nightly, endorses 100% compliance with restorative sleep.   He has tremor in the last 2 years, mother had similar, sister also has tremor, taking propranolol and PRN valium with improvement. Typically takes valium in AM, doesn't take in the evening to avoid combining with alcohol.   Hx of alcohol abuse, formerly was drinking a fifth daily, down to 1-2 beers and 1/2 pint of rum daily last OV>  Today he reports no ETOH intake.  he quit smoking Nov 2020 after noting wheezing and exertional dyspnea; resolved sx since quitting.  Has 30+ pack/year  smoking hx, is undergoing annual low dose lung CT screenings. Last in 12/2018 and appeared benign though shows aortic atherosclerosis and mild centrilobular emphysema.   he has a diagnosis of GERD which is currently managed by nexium 20 mg daily.  he reports symptoms is currently well controlled, and denies breakthrough reflux, burning in chest, hoarseness or cough.    BMI is Body mass index is 26.85 kg/m., he has been working on diet and exercise. The patient has been very active over the past year- taking walks ~1 hr daily pushing his grandson in his stroller at a brisk pace. Patient is pleased with his weight progress, recounts excellent dietary choices he is making; presents with normalized BMI- previously  noted in overweight range.  Wt Readings from Last 3 Encounters:  01/30/20 198 lb (89.8 kg)  10/30/19 188 lb (85.3 kg)  09/23/19 182 lb (82.6 kg)   He has aortic atherosclerosis per CT 12/2018 His blood pressure has been controlled at home, today their BP is BP: 140/80  He does workout. He denies chest pain, shortness of breath, dizziness.   He is on cholesterol medication (rosuvastatin 40 mg daily, fenofibrate 160 mg daily) and denies myalgias. His LDL cholesterol is at goal, trigs remain elevated. The cholesterol last visit was:   Lab Results  Component Value Date   CHOL 187 10/30/2019   HDL 48 10/30/2019   LDLCALC 112 (H) 10/30/2019   TRIG 152 (H) 10/30/2019   CHOLHDL 3.9 10/30/2019    He has been working on diet and exercise for glucose management, and denies hyperglycemia, hypoglycemia , nausea, paresthesia of the feet, polydipsia and polyuria. Last A1C in the office was:  Lab Results  Component Value Date   HGBA1C 5.2 10/30/2019   He has CKD III followed our office. Last GFR was:  Lab Results  Component Value Date   GFRNONAA 43 (L) 10/30/2019   Patient is on Vitamin D supplement (taking 15000 IU daily):  Lab Results  Component Value Date   VD25OH 69 10/30/2019     Patient was treated in 2014 for Prostate Ca with Brachytherapy, was released by urology.  Lab Results  Component Value Date   PSA <0.1 04/15/2019   PSA <0.1 03/08/2018   PSA <0.1 02/20/2017      Medication Review: Current Outpatient Medications on File Prior to Visit  Medication Sig Dispense Refill  . aspirin 81 MG tablet Take 81 mg by mouth every other day.    . B Complex Vitamins (VITAMIN B-COMPLEX PO) Take 1 tablet by mouth daily.    . Cholecalciferol (VITAMIN D3) 125 MCG (5000 UT) TABS Take 15,000 Units by mouth every morning.    . diazepam (VALIUM) 5 MG tablet Take 1/2 to 1 tablet 3 x /day for Tremor 90 tablet 2  . esomeprazole (NEXIUM) 20 MG capsule Take 1 capsule daily for Heartburn (Patient  taking differently: Take 20 mg by mouth every evening.)    . ezetimibe (ZETIA) 10 MG tablet Take     1 tablet     Daily      for Cholesterol 90 tablet 0  . fenofibrate micronized (LOFIBRA) 134 MG capsule Take 1 capsule Daily for Triglycerides (Blood Fats) 90 capsule 3  . Magnesium 400 MG CAPS Take 400 mg by mouth every evening.     . Omega-3 Fatty Acids (FISH OIL) 1000 MG CAPS Take 1,000 mg by mouth every morning.    . propranolol ER (INDERAL LA) 120 MG 24 hr capsule Take  1 capsule every Morning for BP & tremor 90 capsule 3  . rosuvastatin (CRESTOR) 40 MG tablet TAKE 1 TABLET BY MOUTH DAILY FOR CHOLESTEROL 90 tablet 3  . vitamin E 1000 UNIT capsule Take 3,000 Units by mouth every morning.      No current facility-administered medications on file prior to visit.    Allergies: Allergies  Allergen Reactions  . Lipitor [Atorvastatin] Other (See Comments)    fatigued  . Niacin And Related Other (See Comments)    Flushing  . Penicillins Rash    Current Problems (verified) has History of prostate cancer; GERD (gastroesophageal reflux disease); Hyperlipidemia, mixed; Essential hypertension; Vitamin D deficiency; Medication management; BMI 24.0-24.9, adult; Former smoker (quit 12/11/2018, 30+ pack year history); CKD (chronic kidney disease) stage 3, GFR 30-59 ml/min ([place]); Abnormal glucose; Thoracic aorta atherosclerosis ([place]); Emphysema lung ([place]); Hypersomnia with sleep apnea; Snoring; Excessive drinking of alcohol; Other secondary hypertension; History of skin cancer; Familial tremor; Hepatic steatosis; and Elevated ferritin on their problem list.  Screening Tests Immunization History  Administered Date(s) Administered  . DT (Pediatric) 10/20/2011  . PFIZER SARS-COV-2 Vaccination 04/04/2019, 04/30/2019  . Pneumococcal Conjugate-13 11/14/2017  . Pneumococcal Polysaccharide-23 04/28/2016  . Zoster 10/20/2011   Preventative care: Last colonoscopy: 12/02/2011 due 2023 Chest CT screen:  12/2018 - benign nodules, aortic atherosclerosis, emphysema   Prior vaccinations: TD or Tdap: 2013  Influenza: DUE declines  Pneumococcal 23: 04/28/2016 Prevnar13: 11/2017 Shingles/Zostavax: 10/20/2011 Covid 19: 2/2, 2021, pfizer  Names of Other Physician/Practitioners you currently use: 1. [facility] here for primary care 2. Dr. Katy Fitch, eye doctor, last visit  2021  3. Dr. Mariea Clonts, dentist, last visit 2021, goes q32m 4. [facility], last visit 2021, goes annually, has upcoming appointment   Patient Care Team: Unk Pinto, MD as PCP - General (Internal Medicine) Arloa Koh, MD (Inactive) as Consulting Physician (Radiation Oncology) Clent Jacks, MD as Consulting Physician (Ophthalmology) Edrick Oh, MD as Consulting Physician (Nephrology)  Surgical: He  has a past surgical history that includes Prostate biopsy (12/19/2011); Colonoscopy w/ polypectomy; Radioactive seed implant (N/A, 04/04/2012); Cystoscopy (N/A, 04/04/2012); Eye surgery; and Inguinal hernia repair (Bilateral, 10/18/2018). Family His family history includes Breast cancer in his mother; Cancer in his maternal grandfather; Dementia in his father; Heart attack (age of onset: 70) in his father; Prostate cancer in his cousin and maternal uncle; Ulcers in his maternal grandmother. Social history  He reports that he quit smoking about 13 months ago. His smoking use included cigarettes. He has a 30.00 pack-year smoking history. He has never used smokeless tobacco. He reports current alcohol use of about 14.0 standard drinks of alcohol per week. He reports current drug use. Frequency: 7.00 times per week. Drug: Marijuana.  Review of Systems  Constitutional: Negative for malaise/fatigue and weight loss.  HENT: Negative for hearing loss and tinnitus.   Eyes: Negative for blurred vision and double vision.  Respiratory: Negative for cough, sputum production, shortness of breath  and wheezing.   Cardiovascular: Negative for chest pain, palpitations, orthopnea, claudication, leg swelling and PND.  Gastrointestinal: Negative for abdominal pain, blood in stool, constipation, diarrhea, heartburn, melena, nausea and vomiting.  Genitourinary: Negative.   Musculoskeletal: Negative for falls, joint pain and myalgias.  Skin: Negative for rash.  Neurological: Positive for tremors (bil hands). Negative for dizziness, tingling, sensory change, weakness and headaches.  Endo/Heme/Allergies: Negative for polydipsia.  Psychiatric/Behavioral: Negative.  Negative for depression, memory loss, substance abuse and suicidal ideas. The patient is not  nervous/anxious and does not have insomnia.   All other systems reviewed and are negative.    Objective:   Today's Vitals   01/30/20 1006  BP: 140/80  Temp: 97.6 F (36.4 C)  Weight: 198 lb (89.8 kg)   Body mass index is 26.85 kg/m.  General appearance: alert, red through face/neck , no distress, WD/WN, male HEENT: normocephalic, sclerae anicteric, TMs pearly, nares patent, no discharge or erythema, pharynx normal, normal hearing with bilateral hearing aids Oral cavity: MMM, no lesions Neck: supple, no lymphadenopathy, no thyromegaly, no masses Heart: RRR, normal S1, S2, no murmurs Lungs: Symmetrical, clear, not diminished, no wheezes, rhonchi, or rales Abdomen: +bs, soft,  non distended,  No palpable organomegaly or hernias Musculoskeletal: No deformity, no swelling Extremities: no edema, no cyanosis, no clubbing Pulses: 2+ symmetric, upper and lower extremities, normal cap refill Neurological: alert, oriented x 3, CN2-12 intact, strength normal upper extremities and lower extremities, sensation normal throughout, DTRs 2+ throughout, no cerebellar signs, gait normal. Resting tremor bilateral hands.  Psychiatric: normal affect, behavior normal, pleasant   Medicare Attestation I have personally reviewed: The patient's medical and  social history Their use of alcohol, tobacco or illicit drugs Their current medications and supplements The patient's functional ability including ADLs,fall risks, home safety risks, cognitive, and hearing and visual impairment Diet and physical activities Evidence for depression or mood disorders  The patient's weight, height, BMI, and visual acuity have been recorded in the chart.  I have made referrals, counseling, and provided education to the patient based on review of the above and I have provided the patient with a written personalized care plan for preventive services.     Garnet Sierras, NP   01/30/2020

## 2020-01-31 LAB — CBC WITH DIFFERENTIAL/PLATELET
Absolute Monocytes: 756 cells/uL (ref 200–950)
Basophils Absolute: 28 cells/uL (ref 0–200)
Basophils Relative: 0.4 %
Eosinophils Absolute: 70 cells/uL (ref 15–500)
Eosinophils Relative: 1 %
HCT: 49 % (ref 38.5–50.0)
Hemoglobin: 17.3 g/dL — ABNORMAL HIGH (ref 13.2–17.1)
Lymphs Abs: 1659 cells/uL (ref 850–3900)
MCH: 35.3 pg — ABNORMAL HIGH (ref 27.0–33.0)
MCHC: 35.3 g/dL (ref 32.0–36.0)
MCV: 100 fL (ref 80.0–100.0)
MPV: 11 fL (ref 7.5–12.5)
Monocytes Relative: 10.8 %
Neutro Abs: 4487 cells/uL (ref 1500–7800)
Neutrophils Relative %: 64.1 %
Platelets: 203 10*3/uL (ref 140–400)
RBC: 4.9 10*6/uL (ref 4.20–5.80)
RDW: 12.6 % (ref 11.0–15.0)
Total Lymphocyte: 23.7 %
WBC: 7 10*3/uL (ref 3.8–10.8)

## 2020-01-31 LAB — COMPLETE METABOLIC PANEL WITH GFR
AG Ratio: 2.4 (calc) (ref 1.0–2.5)
ALT: 14 U/L (ref 9–46)
AST: 20 U/L (ref 10–35)
Albumin: 4.5 g/dL (ref 3.6–5.1)
Alkaline phosphatase (APISO): 31 U/L — ABNORMAL LOW (ref 35–144)
BUN/Creatinine Ratio: 11 (calc) (ref 6–22)
BUN: 20 mg/dL (ref 7–25)
CO2: 27 mmol/L (ref 20–32)
Calcium: 10.5 mg/dL — ABNORMAL HIGH (ref 8.6–10.3)
Chloride: 105 mmol/L (ref 98–110)
Creat: 1.8 mg/dL — ABNORMAL HIGH (ref 0.70–1.18)
GFR, Est African American: 42 mL/min/{1.73_m2} — ABNORMAL LOW (ref >=60)
GFR, Est Non African American: 36 mL/min/{1.73_m2} — ABNORMAL LOW (ref >=60)
Globulin: 1.9 g/dL (calc) (ref 1.9–3.7)
Glucose, Bld: 93 mg/dL (ref 65–99)
Potassium: 4.8 mmol/L (ref 3.5–5.3)
Sodium: 141 mmol/L (ref 135–146)
Total Bilirubin: 0.9 mg/dL (ref 0.2–1.2)
Total Protein: 6.4 g/dL (ref 6.1–8.1)

## 2020-01-31 LAB — LIPID PANEL
Cholesterol: 180 mg/dL (ref <200)
HDL: 51 mg/dL (ref >=40)
LDL Cholesterol (Calc): 101 mg/dL (calc) — ABNORMAL HIGH
Non-HDL Cholesterol (Calc): 129 mg/dL (calc) (ref <130)
Total CHOL/HDL Ratio: 3.5 (calc) (ref <5.0)
Triglycerides: 190 mg/dL — ABNORMAL HIGH (ref <150)

## 2020-02-03 MED FILL — FENOFIBRATE 134 MG CAPSULE: 134 | 90 days supply | Qty: 90 | Fill #3

## 2020-02-10 ENCOUNTER — Ambulatory Visit: Payer: Medicare HMO | Admitting: Adult Health

## 2020-02-12 ENCOUNTER — Ambulatory Visit: Payer: Medicare HMO | Admitting: Adult Health

## 2020-02-12 VITALS — BP 128/74 | HR 59 | Ht 72.0 in | Wt 198.0 lb

## 2020-02-12 DIAGNOSIS — G4733 Obstructive sleep apnea (adult) (pediatric): Secondary | ICD-10-CM | POA: Diagnosis not present

## 2020-02-12 DIAGNOSIS — Z9989 Dependence on other enabling machines and devices: Secondary | ICD-10-CM | POA: Diagnosis not present

## 2020-02-12 NOTE — Patient Instructions (Signed)
Continue using CPAP nightly and greater than 4 hours each night °If your symptoms worsen or you develop new symptoms please let us know.  ° °

## 2020-02-12 NOTE — Progress Notes (Signed)
PATIENT: Juan Torres DOB: [birth year]  REASON FOR VISIT: follow up HISTORY FROM: patient  HISTORY OF PRESENT ILLNESS: Today 02/12/20:  Juan Torres is a 74 year old male with a history of obstructive sleep apnea on CPAP.  His download indicates that he uses machine nightly for compliance of 100%.  He uses machine greater than 4 hours 23 days for compliance of 77%.  On average he uses his machine 5 hours and 14 minutes.  His residual AHI is 1.5 on 5 to 15 cm of water with EPR 2.  Leak in the 95th percentile is 31.6 L/min.  He reports that the mask irritates the skin on his nose.  He has been putting a cushion bandage over the bridge of his nose.  He has tried several different masks in the past but likes this on the best.  He returns today for follow-up.  04/25/19:Juan Torres is a 74 year old male with a history of obstructive sleep apnea on CPAP.  His download indicates that he uses his machine nightly for compliance of 100%.  He uses his machine greater than 4 hours 29 out of 30 days for compliance of 97%.  He uses his machine on average 7 hours and 17 minutes.  His residual AHI is 1 on 5-15 centimeters of water.  His leak in the 95th percentile is 43.6 L/min.  Reports that he does feel the mask leaking.  He returns today for an evaluation.    HISTORY (Copied from Dr.Dohmeier's note)  Juan Torres a 75 y.o. year old  male patientseen on 10/02/2018 . Chiefconcernaccording to patient :" I have an inguinal hernia surgery coming up and I want to know if my wife is right about me having apnea.  I have the pleasure of seeing Juan Torres today,a right -handed White or Caucasian male with a possible sleep disorder. He  has a past medical history of Adenomatous colon polyp, GERD (gastroesophageal reflux disease), History of basal cell cancer (2012), Hyperlipidemia, Hypertension, Prediabetes, Prostate cancer ([place]) (DX 12/19/11), and Vitamin D deficiency.  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 They watch grandson Juan Torres during the day.  The patient used to work in  24 hour shifts. Pets are present- a cat.  Tobacco use daily. ETOH use ; beer and a couple of shots of rum daily, Caffeine intake in form of Coffee( none  ) Soda( none) Tea ( rarely) or energy drinks. Regular exercise in form of walking before his hernia started bothering him.   Juan Torres is gardening.    Sleep habits are as follows:The patient's dinner time is between 5-6 PM. The patient watches tv, drinks beer , and goes to bed at 8-9 PM. No difficulties to enter sleep , and he continues to sleep for 3-4 hours, wakes for coughing, he denies acid reflux on omeprazole.   The preferred sleep position is lateral , with the support of 1 pillow. Dreams are reportedly rare. 5-7 AM is the usual rise time. The patient wakes up spontaneously.  He reports not feeling refreshed or restored in AM,  He feels as if in a fog- with symptoms such as dry mouth,and residual fatigue. Naps are taken infrequently. Review of Systems: Out of a complete 14 system review, the patient complains of only the following symptoms, and all other reviewed systems are negative.:  Fatigue,witnessed apnea, snoring, fragmented sleep- coughing.  Motorcycle .   How likely are you to doze in the following situations: 0 = not likely, 1 = slight chance, 2 = moderate chance, 3 = high chance  Sitting and Reading? Watching Television? Sitting inactive in a public place (theater or meeting)? As a passenger in a car for an hour without a break? Lying down in the afternoon when circumstances permit? Sitting and talking to someone? Sitting quietly after lunch without alcohol? In a car, while stopped for a few minutes in traffic?  Total =2/ 24 points  FSS endorsed at 21/ 63 points.    REVIEW OF SYSTEMS: Out of a complete 14 system review  of symptoms, the patient complains only of the following symptoms, and all other reviewed systems are negative.  FSS 21 ESS 0  ALLERGIES: Allergies  Allergen Reactions  . Lipitor [Atorvastatin] Other (See Comments)    fatigued  . Niacin And Related Other (See Comments)    Flushing  . Penicillins Rash    HOME MEDICATIONS: Outpatient Medications Prior to Visit  Medication Sig Dispense Refill  . aspirin 81 MG tablet Take 81 mg by mouth every other day.    . B Complex Vitamins (VITAMIN B-COMPLEX PO) Take 1 tablet by mouth daily.    . Cholecalciferol (VITAMIN D3) 125 MCG (5000 UT) TABS Take 15,000 Units by mouth every morning.    . diazepam (VALIUM) 5 MG tablet Take 1/2 to 1 tablet 3 x /day for Tremor 90 tablet 2  . esomeprazole (NEXIUM) 20 MG capsule Take 1 capsule daily for Heartburn (Patient taking differently: Take 20 mg by mouth every evening.)    . ezetimibe (ZETIA) 10 MG tablet Take     1 tablet     Daily      for Cholesterol 90 tablet 0  . fenofibrate micronized (LOFIBRA) 134 MG capsule Take 1 capsule Daily for Triglycerides (Blood Fats) 90 capsule 3  . Magnesium 400 MG CAPS Take 400 mg by mouth every evening.     . Omega-3 Fatty Acids (FISH OIL) 1000 MG CAPS Take 1,000 mg by mouth every morning.    . propranolol ER (INDERAL LA) 120 MG 24 hr capsule Take 1 capsule every Morning for BP & tremor 90 capsule 3  . rosuvastatin (CRESTOR) 40 MG tablet TAKE 1 TABLET BY MOUTH DAILY FOR CHOLESTEROL 90 tablet 3  . vitamin E 1000 UNIT capsule Take 3,000 Units by mouth every morning.      No facility-administered medications prior to visit.    PAST MEDICAL HISTORY: Past Medical History:  Diagnosis Date  . Adenomatous colon polyp   . GERD (gastroesophageal reflux disease)   . History of basal cell cancer 2012  . Hyperlipidemia   . Hypertension   . Prediabetes   . Prostate cancer ([place]) DX 12/19/11   bx=Adenocarcinoma,gleason=3+3=6,volume=42cc,PSA=4.63  . Pulmonary nodules 12/10/2018    Numerous unchanged/benign appearing per CT lung 12/2018  . Vitamin D deficiency     PAST SURGICAL HISTORY: Past Surgical History:  Procedure Laterality Date  . COLONOSCOPY W/ POLYPECTOMY     Adenomatous  . CYSTOSCOPY N/A 04/04/2012   Procedure: CYSTOSCOPY;  Surgeon: Molli Hazard, MD;  Location: [facility];  Service: Urology;  Laterality: N/A;  . EYE SURGERY     bilateral cataracts   . INGUINAL HERNIA REPAIR Bilateral 10/18/2018   Procedure: LAPAROSCOPIC BILATERAL INGUINAL HERNIA REPAIR;  Surgeon: Johnathan Hausen, MD;  Location: [facility];  Service: General;  Laterality: Bilateral;  . PROSTATE BIOPSY  12/19/2011   Procedure: BIOPSY TRANSRECTAL ULTRASONIC PROSTATE (TUBP);  Surgeon: Molli Hazard, MD;  Location: [facility];  Service: Urology;  Laterality: N/A;    . RADIOACTIVE SEED IMPLANT N/A 04/04/2012   Procedure: RADIOACTIVE SEED IMPLANT;  Surgeon: Molli Hazard, MD;  Location: [facility];  Service: Urology;  Laterality: N/A;  Seeds Implanted     72  Seeds Found in Bladder     None     FAMILY HISTORY: Family History  Problem Relation Age of Onset  . Breast cancer Mother   . Cancer Maternal Grandfather        colon  . Heart attack Father 31  . Dementia Father   . Prostate cancer Maternal Uncle        prostate  . Prostate cancer Cousin        prostate cancer 1st maternal   . Ulcers Maternal Grandmother     SOCIAL HISTORY: Social History   Socioeconomic History  . Marital status: Married    Spouse name: Not on file  . Number of children: 2  . Years of education: Not on file  . Highest education level: Not on file  Occupational History  . Not on file  Tobacco Use  . Smoking status: Former Smoker    Packs/day: 1.00    Years: 30.00    Pack years: 30.00    Types: Cigarettes    Quit date: 12/11/2018    Years since quitting: 1.1  . Smokeless tobacco: Never Used  Vaping Use  . Vaping Use: Never used   Substance and Sexual Activity  . Alcohol use: Yes    Alcohol/week: 14.0 standard drinks    Types: 14 Cans of beer per week    Comment: 2 beers/day, 1/2 pint of rum a day  . Drug use: Yes    Frequency: 7.0 times per week    Types: Marijuana    Comment: / marijuana-last smoked 10/15/2018  . Sexual activity: Not on file  Other Topics Concern  . Not on file  Social History Narrative   Lives at home with spouse   Right handed   Caffeine: none   Social Determinants of Health   Financial Resource Strain: Not on file  Food Insecurity: Not on file  Transportation Needs: Not on file  Physical Activity: Not on file  Stress: Not on file  Social Connections: Not on file  Intimate Partner Violence: Not on file      PHYSICAL EXAM  Vitals:   02/12/20 1411  BP: 128/74  Pulse: (!) 59  Weight: 198 lb (89.8 kg)  Height: 6' (1.829 m)   Body mass index is 26.85 kg/m.  Generalized: Well developed, in no acute distress  Chest: Lungs clear to auscultation bilaterally  Neurological examination  Mentation: Alert oriented to time, place, history taking. Follows all commands speech and language fluent Cranial nerve II-XII: Extraocular movements were full, visual field were full on confrontational test Head turning and shoulder shrug  were normal and symmetric. Motor: The motor testing reveals 5 over 5 strength of all 4 extremities. Good symmetric motor tone is noted throughout.  Sensory: Sensory testing is intact to soft touch on all 4 extremities. No evidence of extinction is noted.  Gait and station: Gait is normal.    DIAGNOSTIC DATA (LABS, IMAGING, TESTING) - I reviewed patient records, labs, notes, testing and imaging myself where available.  Lab Results  Component Value Date   WBC 7.0 01/30/2020  HGB 17.3 (H) 01/30/2020   HCT 49.0 01/30/2020   MCV 100.0 01/30/2020   PLT 203 01/30/2020      Component Value Date/Time   NA 141 01/30/2020 1035   K 4.8 01/30/2020 1035   CL  105 01/30/2020 1035   CO2 27 01/30/2020 1035   GLUCOSE 93 01/30/2020 1035   BUN 20 01/30/2020 1035   CREATININE 1.80 (H) 01/30/2020 1035   CALCIUM 10.5 (H) 01/30/2020 1035   PROT 6.4 01/30/2020 1035   ALBUMIN 4.2 08/02/2016 1014   AST 20 01/30/2020 1035   ALT 14 01/30/2020 1035   ALKPHOS 32 (L) 08/02/2016 1014   BILITOT 0.9 01/30/2020 1035   GFRNONAA 36 (L) 01/30/2020 1035   GFRAA 42 (L) 01/30/2020 1035   Lab Results  Component Value Date   CHOL 180 01/30/2020   HDL 51 01/30/2020   LDLCALC 101 (H) 01/30/2020   TRIG 190 (H) 01/30/2020   CHOLHDL 3.5 01/30/2020   Lab Results  Component Value Date   HGBA1C 5.2 10/30/2019   Lab Results  Component Value Date   VITAMINB12 780 06/12/2018   Lab Results  Component Value Date   TSH 2.51 10/30/2019      ASSESSMENT AND PLAN 75 y.o. year old male  has a past medical history of Adenomatous colon polyp, GERD (gastroesophageal reflux disease), History of basal cell cancer (2012), Hyperlipidemia, Hypertension, Prediabetes, Prostate cancer ([facility]) (DX 12/19/11), Pulmonary nodules (12/10/2018), and Vitamin D deficiency. here with:  1. OSA on CPAP  - CPAP compliance excellent - Good treatment of AHI  - Encourage patient to use CPAP nightly and > 4 hours each night - F/U in 1 year or sooner if needed   I spent 20 minutes of face-to-face and non-face-to-face time with patient.  This included previsit chart review, lab review, study review, order entry, electronic health record documentation, patient education.  Butch Penny, MSN, NP-C 02/12/2020, 2:12 PM [facility] [address] [place], [place] [address] [phone]

## 2020-03-02 ENCOUNTER — Other Ambulatory Visit: Payer: Self-pay | Admitting: Internal Medicine

## 2020-03-02 DIAGNOSIS — I10 Essential (primary) hypertension: Secondary | ICD-10-CM

## 2020-03-02 DIAGNOSIS — G250 Essential tremor: Secondary | ICD-10-CM

## 2020-03-02 MED FILL — EZETIMIBE 10 MG TABS: 10 | 90 days supply | Qty: 90 | Fill #0

## 2020-03-02 MED FILL — PROPRANOLOL HCL ER 120 MG C: 120 | 90 days supply | Qty: 90 | Fill #0

## 2020-03-02 MED FILL — ROSUVASTATIN CALCIUM 40 MG: 40 | 90 days supply | Qty: 90 | Fill #1

## 2020-04-20 ENCOUNTER — Encounter: Payer: Medicare HMO | Admitting: Internal Medicine

## 2020-05-19 ENCOUNTER — Encounter: Payer: Self-pay | Admitting: Internal Medicine

## 2020-05-19 NOTE — Progress Notes (Signed)
Annual  Screening/Preventative Visit  & Comprehensive Evaluation & Examination   Future Appointments  Date Time Provider [place]  05/20/2020  3:00 PM Unk Pinto, MD [facility] None  08/04/2020  3:00 PM Liane Comber, NP [facility] None  02/11/2021  9:30 AM Ward Givens, NP [facility] None  05/25/2021 10:00 AM Unk Pinto, MD [facility] None                                               This very nice 75 y.o.  MWM  presents for a Screening /Preventative Visit & comprehensive evaluation and management of multiple medical co-morbidities.  Patient has been followed for HTN, HLD, Prediabetes and Vitamin D Deficiency. Patient has GERD conrtrolled with his OTC Nexium. 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 Patient has familial Tremor treated wit Proprano;lol.      HTN predates circa 1998. Patient's BP has been controlled at home.  Today's BP is at goal - 126/64. 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  Patient denies any cardiac symptoms as chest pain, palpitations, shortness of breath, dizziness or ankle swelling.      Patient's hyperlipidemia is controlled with diet and Rosuvastatin Edwina Barth /Fenofibrate. Patient denies myalgias or other medication SE's. Last lipids were at goal with slightly elevated Trig's:  Lab Results  Component Value Date   CHOL 180 01/30/2020   HDL 51 01/30/2020   LDLCALC 101 (H) 01/30/2020   TRIG 190 (H) 01/30/2020   CHOLHDL 3.5 01/30/2020        Patient has hx/o prediabetes  (A1c 5.7% /2014) and patient denies reactive hypoglycemic symptoms, visual blurring, diabetic polys or paresthesias. Last A1c was normal & at goal:   Lab Results  Component Value Date   HGBA1C 5.2 10/30/2019         Finally, patient has history of Vitamin D Deficiency ("13" /2013) and last vitamin D was near goal:   Lab Results  Component Value Date   VD25OH 58 10/30/2019    Current  Outpatient Medications on File Prior to Visit  Medication Sig  . aspirin 81 MG tablet Take  every other day.  . B Complex Vitamins Take 1 tablet  daily.  Marland Kitchen VITAMIN D 15,000 Units Take   every morning.  . diazepam  5 MG tablet Take 1/2 to 1 tablet 3 x /day for Tremor  . esomeprazole  20 MG capsule Take 1 capsule daily   . ezetimibe 10 MG tablet TAKE 1 TABLET   DAILY  . fenofibrate  134 MG capsule Take 1 capsule Daily   . Magnesium 400 MG CAPS Take every evening.   . Omega-3 FISH OIL 1000 MG  Take 1 every morning.  . propranolol ER 120 MG  TAKE 1 CAPSULE DAILY FOR   . rosuvastatin  40 MG tablet TAKE 1 TABLET DAILY   . vitamin E 3,000 Units   Take every morning.      Allergies  Allergen Reactions  . Lipitor [Atorvastatin] Other (See Comments)    fatigued  . Niacin And Related Other (See Comments)    Flushing  . Penicillins Rash    Past Medical History:  Diagnosis Date  . Adenomatous colon polyp   . GERD (gastroesophageal reflux disease)   . History of basal cell cancer 2012  .  Hyperlipidemia   . Hypertension   . Prediabetes   . Prostate cancer ([place]) DX 12/19/11   bx=Adenocarcinoma,gleason=3+3=6,volume=42cc,PSA=4.63  . Pulmonary nodules 12/10/2018   Numerous unchanged/benign appearing per CT lung 12/2018  . Vitamin D deficiency     Health Maintenance  Topic Date Due  . COVID-19 Vaccine (3 - Pfizer risk 4-dose series) 05/28/2019  . INFLUENZA VACCINE  09/07/2020  . TETANUS/TDAP  10/19/2021  . COLONOSCOPY  12/01/2021  . Hepatitis C Screening  Completed  . PNA vac Low Risk Adult  Addressed  . HPV VACCINES  Aged Out    Immunization History  Administered Date(s) Administered  . DT (Pediatric) 10/20/2011  . PFIZER SARS-COV-2 Vacc 04/04/2019, 04/30/2019  . Pneumococcal -13 11/14/2017  . Pneumococcal -23 04/28/2016  . Zoster 10/20/2011    Last Colon - 10.25.2013 - Dr Deatra Ina - recc 10 year f/u due Nov 2023.  Past Surgical History:  Procedure Laterality Date  .  COLONOSCOPY W/ POLYPECTOMY     Adenomatous  . CYSTOSCOPY N/A 04/04/2012   Procedure: CYSTOSCOPY;  Surgeon: Molli Hazard, MD;  Location: [facility];  Service: Urology;  Laterality: N/A;  . EYE SURGERY     bilateral cataracts   . INGUINAL HERNIA REPAIR Bilateral 10/18/2018   Procedure: LAPAROSCOPIC BILATERAL INGUINAL HERNIA REPAIR;  Surgeon: Johnathan Hausen, MD;  Location: [facility];  Service: General;  Laterality: Bilateral;  . PROSTATE BIOPSY  12/19/2011   Procedure: BIOPSY TRANSRECTAL ULTRASONIC PROSTATE (TUBP);  Surgeon: Molli Hazard, MD;  Location: [facility];  Service: Urology;  Laterality: N/A;    . RADIOACTIVE SEED IMPLANT N/A 04/04/2012   Procedure: RADIOACTIVE SEED IMPLANT;  Surgeon: Molli Hazard, MD;  Location: [facility];  Service: Urology;  Laterality: N/A;  Seeds Implanted     72  Seeds Found in Bladder     None     Family History  Problem Relation Age of Onset  . Breast cancer Mother   . Cancer Maternal Grandfather        colon  . Heart attack Father 1  . Dementia Father   . Prostate cancer Maternal Uncle        prostate  . Prostate cancer Cousin        prostate cancer 1st maternal   . Ulcers Maternal Grandmother     Social History   Socioeconomic History  . Marital status: Married    Spouse name: [name]  . Number of children: 2  . Years of education: Not on file  . Highest education level: Not on file  Occupational History  . Retired EMT  Tobacco Use  . Smoking status: Former Smoker    Packs/day: 1.00    Years: 30.00    Pack years: 30.00    Types: Cigarettes    Quit date: 12/11/2018    Years since quitting: 1.4  . Smokeless tobacco: Never Used  Vaping Use  . Vaping Use: Never used  Substance and Sexual Activity  . Alcohol use: Yes    Alcohol/week: 14.0 standard drinks    Types: 14 Cans of beer per week    Comment: 2 beers/day, 1/2 pint of rum a day  . Drug use: Yes    Frequency:  7.0 times per week    Types: Marijuana    Comment: / marijuana-last smoked 10/15/2018  . Sexual activity: Not on file   ROS Constitutional: Denies fever, chills, weight loss/gain, headaches, insomnia,  night sweats or change in appetite. Does  c/o fatigue. Eyes: Denies redness, blurred vision, diplopia, discharge, itchy or watery eyes.  ENT: Denies discharge, congestion, post nasal drip, epistaxis, sore throat, earache, hearing loss, dental pain, Tinnitus, Vertigo, Sinus pain or snoring.  Cardio: Denies chest pain, palpitations, irregular heartbeat, syncope, dyspnea, diaphoresis, orthopnea, PND, claudication or edema Respiratory: denies cough, dyspnea, DOE, pleurisy, hoarseness, laryngitis or wheezing.  Gastrointestinal: Denies dysphagia, heartburn, reflux, water brash, pain, cramps, nausea, vomiting, bloating, diarrhea, constipation, hematemesis, melena, hematochezia, jaundice or hemorrhoids Genitourinary: Denies dysuria, frequency, urgency, nocturia, hesitancy, discharge, hematuria or flank pain Musculoskeletal: Denies arthralgia, myalgia, stiffness, Jt. Swelling, pain, limp or strain/sprain. Denies Falls. Skin: Denies puritis, rash, hives, warts, acne, eczema or change in skin lesion Neuro: No weakness, tremor, incoordination, spasms, paresthesia or pain Psychiatric: Denies confusion, memory loss or sensory loss. Denies Depression. Endocrine: Denies change in weight, skin, hair change, nocturia, and paresthesia, diabetic polys, visual blurring or hyper / hypo glycemic episodes.  Heme/Lymph: No excessive bleeding, bruising or enlarged lymph nodes.  Physical Exam  BP 126/64   Pulse 60   Temp (!) 97.5 F (36.4 C) (Temporal)   Resp 16   Ht 5' 11.8" (1.824 m)   Wt 199 lb 6.4 oz (90.4 kg)   SpO2 99%   BMI 27.19 kg/m   General Appearance: Well nourished and well groomed and in no apparent distress.  Eyes: PERRLA, EOMs, conjunctiva no swelling or erythema, normal fundi and  vessels. Sinuses: No frontal/maxillary tenderness ENT/Mouth: EACs patent / TMs  nl. Nares clear without erythema, swelling, mucoid exudates. Oral hygiene is good. No erythema, swelling, or exudate. Tongue normal, non-obstructing. Tonsils not swollen or erythematous. Hearing normal.  Neck: Supple, thyroid not palpable. No bruits, nodes or JVD. Respiratory: Respiratory effort normal.  BS equal and clear bilateral without rales, rhonci, wheezing or stridor. Cardio: Heart sounds are normal with regular rate and rhythm and no murmurs, rubs or gallops. Peripheral pulses are normal and equal bilaterally without edema. No aortic or femoral bruits. Chest: symmetric with normal excursions and percussion.  Abdomen: Soft, with Nl bowel sounds. Nontender, no guarding, rebound, hernias, masses, or organomegaly.  Lymphatics: Non tender without lymphadenopathy.  Musculoskeletal: Full ROM all peripheral extremities, joint stability, 5/5 strength, and normal gait. Skin: Warm and dry without rashes, lesions, cyanosis, clubbing or  ecchymosis.  Neuro: Cranial nerves intact, reflexes equal bilaterally. Normal muscle tone, no cerebellar symptoms. Sensation intact.  Pysch: Alert and oriented X 3 with normal affect, insight and judgment appropriate.   Assessment and Plan  1. Annual Preventative/Screening Exam    2. Essential hypertension  - EKG 12-Lead - [place], RETROPERITNL ABD,  LTD - Urinalysis, Routine w reflex microscopic - Microalbumin / creatinine urine ratio - CBC with Differential/Platelet - COMPLETE METABOLIC PANEL WITH GFR - Magnesium - TSH  3. Hyperlipidemia, mixed  - EKG 12-Lead - [place], RETROPERITNL ABD,  LTD - Lipid panel - TSH  4. Thoracic aorta atherosclerosis ([place]) by CT scan  09/12/2018  - EKG 12-Lead - [place], RETROPERITNL ABD,  LTD - Lipid panel  5. Abnormal glucose  - EKG 12-Lead - [place], RETROPERITNL ABD,  LTD - Hemoglobin A1c - Insulin, random  6. Vitamin D deficiency  - VITAMIN  D 25 Hydroxyl  7. Gastroesophageal reflux disease  - CBC with Differential/Platelet  8. Stage 3b chronic kidney disease (HCC)  - Urinalysis, Routine w reflex microscopic - Microalbumin / creatinine urine ratio - PTH, intact and calcium - COMPLETE METABOLIC PANEL WITH GFR  9. Prostate cancer screening  -  PSA  10. Screening for colorectal cancer  - POC Hemoccult Bld/Stl   11. History of prostate cancer  - PSA  12. Familial tremor   13. Screening for ischemic heart disease  - EKG 12-Lead  14. Former smoker  - EKG 12-Lead - [place], RETROPERITNL ABD,  LTD  15. FHx: heart disease  - EKG 12-Lead - [place], RETROPERITNL ABD,  LTD  16. Screening for AAA (aortic abdominal aneurysm)  - [place], RETROPERITNL ABD,  LTD  17. Medication management  - Urinalysis, Routine w reflex microscopic - Microalbumin / creatinine urine ratio - CBC with Differential/Platelet - COMPLETE METABOLIC PANEL WITH GFR - Magnesium - Lipid panel - TSH - Hemoglobin A1c - Insulin, random - VITAMIN D 25 Hydroxyl         Patient was counseled in prudent diet, weight control to achieve/maintain BMI less than 25, BP monitoring, regular exercise and medications as discussed.  Discussed med effects and SE's. Routine screening labs and tests as requested with regular follow-up as recommended. Over 40 minutes of exam, counseling, chart review and high complex critical decision making was performed   Kirtland Bouchard, MD

## 2020-05-19 NOTE — Patient Instructions (Signed)
Due to recent changes in healthcare laws, you may see the results of your imaging and laboratory studies on MyChart before your provider has had a chance to review them.  We understand that in some cases there may be results that are confusing or concerning to you. Not all laboratory results come back in the same time frame and the provider may be waiting for multiple results in order to interpret others.  Please give us 48 hours in order for your provider to thoroughly review all the results before contacting the office for clarification of your results.  ° °+++++++++++++++++++++++++++++++ ° Vit D  & °Vit C 1,000 mg   °are recommended to help protect  °against the Covid-19 and other Corona viruses.  ° ° Also it's recommended  °to take  °Zinc 50 mg  °to help  °protect against the Covid-19   °and best place to get ° is also on [employer]  °and don't pay more than 6-8 cents /pill !  °================================ °Coronavirus (COVID-19) Are you at risk? ° °Are you at risk for the Coronavirus (COVID-19)? ° °To be considered HIGH RISK for Coronavirus (COVID-19), you have to meet the following criteria: ° °• Traveled to [place], [place], [place], [place] or [place]; or in the [place] to [place], [place], [place]  °• or [place]; and have fever, cough, and shortness of breath within the last 2 weeks of travel OR °• Been in close contact with a person diagnosed with COVID-19 within the last 2 weeks and have  °• fever, cough,and shortness of breath °•  °• IF YOU DO NOT MEET THESE CRITERIA, YOU ARE CONSIDERED LOW RISK FOR COVID-19. ° °What to do if you are HIGH RISK for COVID-19? ° °• If you are having a medical emergency, call 911. °• Seek medical care right away. Before you go to a doctor’s office, urgent care or emergency department, °•  call ahead and tell them about your recent travel, contact with someone diagnosed with COVID-19  °•  and your symptoms.  °• You should receive instructions from your  physician’s office regarding next steps of care.  °• When you arrive at healthcare provider, tell the healthcare staff immediately you have returned from  °• visiting [place], [place], [place], [place] or [place]; or traveled in the [place] to [place], [place],  °• [place] or [place] in the last two weeks or you have been in close contact with a person diagnosed with  °• COVID-19 in the last 2 weeks.   °• Tell the health care staff about your symptoms: fever, cough and shortness of breath. °• After you have been seen by a medical provider, you will be either: °o Tested for (COVID-19) and discharged home on quarantine except to seek medical care if  °o symptoms worsen, and asked to  °- Stay home and avoid contact with others until you get your results (4-5 days)  °- Avoid travel on public transportation if possible (such as bus, train, or airplane) or °o Sent to the Emergency Department by EMS for evaluation, COVID-19 testing  and  °o possible admission depending on your condition and test results. ° °What to do if you are LOW RISK for COVID-19? ° °Reduce your risk of any infection by using the same precautions used for avoiding the common cold or flu:  °• Wash your hands often with soap and warm water for at least 20 seconds.  If soap and water are not readily   available,  °• use an alcohol-based hand sanitizer with at least 60% alcohol.  °• If coughing or sneezing, cover your mouth and nose by coughing or sneezing into the elbow areas of your shirt or coat, °•  into a tissue or into your sleeve (not your hands). °• Avoid shaking hands with others and consider head nods or verbal greetings only. °• Avoid touching your eyes, nose, or mouth with unwashed hands.  °• Avoid close contact with people who are sick. °• Avoid places or events with large numbers of people in one location, like concerts or sporting events. °• Carefully consider travel plans you have or are making. °• If you are planning any travel  outside or inside the [place], visit the CDC’s Travelers’ Health webpage for the latest health notices. °• If you have some symptoms but not all symptoms, continue to monitor at home and seek medical attention  °• if your symptoms worsen. °• If you are having a medical emergency, call 911. °>>>>>>>>>>>>>>>>>>>>>>>>>>>>>>>>>>>>>>>>>>>>>>>>>>>>>>> °We Do NOT Approve of  °[facility] [place] °Soliciting Our Patients  °To Do Home Visits  °& °We Do NOT Approve of [employer] °> > > > > > > > > > > > > > > > > > > > > > > > > > > > > > > > > > >  > > > > ° ° °Preventive Care for Adults ° °A healthy lifestyle and preventive care can promote health and wellness. Preventive health guidelines for men include the following key practices: °· A routine yearly physical is a good way to check with your health care provider about your health and preventative screening. It is a chance to share any concerns and updates on your health and to receive a thorough exam. °· Visit your dentist for a routine exam and preventative care every 6 months. Brush your teeth twice a day and floss once a day. Good oral hygiene prevents tooth decay and gum disease. °· The frequency of eye exams is based on your age, health, family medical history, use of contact lenses, and other factors. Follow your health care provider's recommendations for frequency of eye exams. °· Eat a healthy diet. Foods such as vegetables, fruits, whole grains, low-fat dairy products, and lean protein foods contain the nutrients you need without too many calories. Decrease your intake of foods high in solid fats, added sugars, and salt. Eat the right amount of calories for you. Get information about a proper diet from your health care provider, if necessary. °· Regular physical exercise is one of the most important things you can do for your health. Most adults should get at least 150 minutes of moderate-intensity exercise (any activity that increases your heart  rate and causes you to sweat) each week. In addition, most adults need muscle-strengthening exercises on 2 or more days a week. °· Maintain a healthy weight. The body mass index (BMI) is a screening tool to identify possible weight problems. It provides an estimate of body fat based on height and weight. Your health care provider can find your BMI and can help you achieve or maintain a healthy weight. For adults 20 years and older: °¨ A BMI below 18.5 is considered underweight. °¨ A BMI of 18.5 to 24.9 is normal. °¨ A BMI of 25 to 29.9 is considered overweight. °¨ A BMI of 30 and above is considered obese. °· Maintain normal blood lipids and cholesterol levels by exercising and minimizing your intake of   saturated fat. Eat a balanced diet with plenty of fruit and vegetables. Blood tests for lipids and cholesterol should begin at age 20 and be repeated every 5 years. If your lipid or cholesterol levels are high, you are over 50, or you are at high risk for heart disease, you may need your cholesterol levels checked more frequently. Ongoing high lipid and cholesterol levels should be treated with medicines if diet and exercise are not working. °· If you smoke, find out from your health care provider how to quit. If you do not use tobacco, do not start. °· Lung cancer screening is recommended for adults aged 55-80 years who are at high risk for developing lung cancer because of a history of smoking. A yearly low-dose CT scan of the lungs is recommended for people who have at least a 30-pack-year history of smoking and are a current smoker or have quit within the past 15 years. A pack year of smoking is smoking an average of 1 pack of cigarettes a day for 1 year (for example: 1 pack a day for 30 years or 2 packs a day for 15 years). Yearly screening should continue until the smoker has stopped smoking for at least 15 years. Yearly screening should be stopped for people who develop a health problem that would prevent them  from having lung cancer treatment. °· If you choose to drink alcohol, do not have more than 2 drinks per day. One drink is considered to be 12 ounces (355 mL) of beer, 5 ounces (148 mL) of wine, or 1.5 ounces (44 mL) of liquor. °· Avoid use of street drugs. Do not share needles with anyone. Ask for help if you need support or instructions about stopping the use of drugs. °· High blood pressure causes heart disease and increases the risk of stroke. Your blood pressure should be checked at least every 1-2 years. Ongoing high blood pressure should be treated with medicines, if weight loss and exercise are not effective. °· If you are 45-79 years old, ask your health care provider if you should take aspirin to prevent heart disease. °· Diabetes screening involves taking a blood sample to check your fasting blood sugar level. Testing should be considered at a younger age or be carried out more frequently if you are overweight and have at least 1 risk factor for diabetes. °· Colorectal cancer can be detected and often prevented. Most routine colorectal cancer screening begins at the age of 50 and continues through age 75. However, your health care provider may recommend screening at an earlier age if you have risk factors for colon cancer. On a yearly basis, your health care provider may provide home test kits to check for hidden blood in the stool. Use of a small camera at the end of a tube to directly examine the colon (sigmoidoscopy or colonoscopy) can detect the earliest forms of colorectal cancer. Talk to your health care provider about this at age 50, when routine screening begins. Direct exam of the colon should be repeated every 5-10 years through age 75, unless early forms of precancerous polyps or small growths are found. °· Hepatitis C blood testing is recommended for all people born from 1945 through 1965 and any individual with known risks for hepatitis C. °· Screening for abdominal aortic aneurysm (AAA)  by  ultrasound is recommended for people who have history of high blood pressure or who are current or former smokers. °· Healthy men should  receive prostate-specific antigen (PSA)   blood tests as part of routine cancer screening. Talk with your health care provider about prostate cancer screening. °· Testicular cancer screening is  recommended for adult males. Screening includes self-exam, a health care provider exam, and other screening tests. Consult with your health care provider about any symptoms you have or any concerns you have about testicular cancer. °· Use sunscreen. Apply sunscreen liberally and repeatedly throughout the day. You should seek shade when your shadow is shorter than you. Protect yourself by wearing long sleeves, pants, a wide-brimmed hat, and sunglasses year round, whenever you are outdoors. °· Once a month, do a whole-body skin exam, using a mirror to look at the skin on your back. Tell your health care provider about new moles, moles that have irregular borders, moles that are larger than a pencil eraser, or moles that have changed in shape or color. °· Stay current with required vaccines (immunizations). °¨ Influenza vaccine. All adults should be immunized every year. °¨ Tetanus, diphtheria, and acellular pertussis (Td, Tdap) vaccine. An adult who has not previously received Tdap or who does not know his vaccine status should receive 1 dose of Tdap. This initial dose should be followed by tetanus and diphtheria toxoids (Td) booster doses every 10 years. Adults with an unknown or incomplete history of completing a 3-dose immunization series with Td-containing vaccines should begin or complete a primary immunization series including a Tdap dose. Adults should receive a Td booster every 10 years. °¨ Zoster vaccine. One dose is recommended for adults aged 60 years or older unless certain conditions are present. °¨  °¨ PREVNAR - Pneumococcal 13-valent conjugate (PCV13) vaccine. When indicated, a  person who is uncertain of his immunization history and has no record of immunization should receive the PCV13 vaccine. An adult aged 19 years or older who has certain medical conditions and has not been previously immunized should receive 1 dose of PCV13 vaccine. This PCV13 should be followed with a dose of pneumococcal polysaccharide (PPSV23) vaccine. The PPSV23 vaccine dose should be obtained 1 or more year(s)after the dose of PCV13 vaccine. An adult aged 19 years or older who has certain medical conditions and previously received 1 or more doses of PPSV23 vaccine should receive 1 dose of PCV13. The PCV13 vaccine dose should be obtained 1 or more years after the last PPSV23 vaccine dose. °¨  °¨ PNEUMOVAX - Pneumococcal polysaccharide (PPSV23) vaccine. When PCV13 is also indicated, PCV13 should be obtained first. All adults aged 65 years and older should be immunized. An adult younger than age 65 years who has certain medical conditions should be immunized. Any person who resides in a nursing home or long-term care facility should be immunized. An adult smoker should be immunized. People with an immunocompromised condition and certain other conditions should receive both PCV13 and PPSV23 vaccines. People with human immunodeficiency virus (HIV) infection should be immunized as soon as possible after diagnosis. Immunization during chemotherapy or radiation therapy should be avoided. Routine use of PPSV23 vaccine is not recommended for American Indians, [place] Natives, or people younger than 65 years unless there are medical conditions that require PPSV23 vaccine. When indicated, people who have unknown immunization and have no record of immunization should receive PPSV23 vaccine. One-time revaccination 5 years after the first dose of PPSV23 is recommended for people aged 19-64 years who have chronic kidney failure, nephrotic syndrome, asplenia, or immunocompromised conditions. People who received 1-2 doses of PPSV23  before age 65 years should receive another dose of PPSV23 vaccine   at age 65 years or later if at least 5 years have passed since the previous dose. Doses of PPSV23 are not needed for people immunized with PPSV23 at or after age 65 years. °¨  °¨ Hepatitis A vaccine. Adults who wish to be protected from this disease, have certain high-risk conditions, work with hepatitis A-infected animals, work in hepatitis A research labs, or travel to or work in countries with a high rate of hepatitis A should be immunized. Adults who were previously unvaccinated and who anticipate close contact with an international adoptee during the first 60 days after arrival in the [place] from a country with a high rate of hepatitis A should be immunized. °¨  °¨ Hepatitis B vaccine. Adults should be immunized if they wish to be protected from this disease, have certain high-risk conditions, may be exposed to blood or other infectious body fluids, are household contacts or sex partners of hepatitis B positive people, are clients or workers in certain care facilities, or travel to or work in countries with a high rate of hepatitis B. °¨  °Preventive Service / Frequency °·  °Ages 65 and over °· Blood pressure check. °· Lipid and cholesterol check. °· Lung cancer screening. / Every year if you are aged 55-80 years and have a 30-pack-year history of smoking and currently smoke or have quit within the past 15 years. Yearly screening is stopped once you have quit smoking for at least 15 years or develop a health problem that would prevent you from having lung cancer treatment. °· Fecal occult blood test (FOBT) of stool. You may not have to do this test if you get a colonoscopy every 10 years. °· Flexible sigmoidoscopy** or colonoscopy.** / Every 5 years for a flexible sigmoidoscopy or every 10 years for a colonoscopy beginning at age 50 and continuing until age 75. °· Hepatitis C blood test.** / For all people born from 1945 through 1965 and  any individual with known risks for hepatitis C. °· Abdominal aortic aneurysm (AAA) screening./ Screening current or former smokers or have Hypertension. °· Skin self-exam. / Monthly. °· Influenza vaccine. / Every year. °· Tetanus, diphtheria, and acellular pertussis (Tdap/Td) vaccine.** / 1 dose of Td every 10 years. ° °· Zoster vaccine.** / 1 dose for adults aged 60 years or older. ° °       Pneumococcal 13-valent conjugate (PCV13) vaccine.  ° °· Pneumococcal polysaccharide (PPSV23) vaccine.  °·  °· Hepatitis A vaccine.** / Consult your health care provider. °· Hepatitis B vaccine.** / Consult your health care provider. °Screening for abdominal aortic aneurysm (AAA)  by ultrasound is recommended for people who have history of high blood pressure or who are current or former smokers. °++++++++++ °Recommend Adult Low Dose Aspirin or  °coated  Aspirin 81 mg daily  °To reduce risk of Colon Cancer 40 %, ° Skin Cancer 26 % ,  °Malignant Melanoma 46% ° and  °Pancreatic cancer 60% °++++++++++++++++++++++ °Vitamin D goal ° is between 70-100.  °Please make sure that you are taking your Vitamin D as directed.  °It is very important as a natural anti-inflammatory  °helping hair, skin, and nails, as well as reducing stroke and heart attack risk.  °It helps your bones and helps with mood. °It also decreases numerous cancer risks so please take it as directed.  °Low Vit D is associated with a 200-300% higher risk for CANCER  °and 200-300% higher risk for HEART   ATTACK  &  STROKE.   °...................................... °  It is also associated with higher death rate at younger ages,  °autoimmune diseases like Rheumatoid arthritis, Lupus, Multiple Sclerosis.    °Also many other serious conditions, like depression, Alzheimer's °Dementia, infertility, muscle aches, fatigue, fibromyalgia - just to name a few. °++++++++++++++++++++++ °Recommend the book "The END of DIETING" by Dr Joel Fuhrman  °& the book "The END of DIABETES " by  Dr Joel Fuhrman °At [employer] - get book & Audio CD's  °  Being diabetic has a  300% increased risk for heart attack, stroke, cancer, and alzheimer- type vascular dementia. It is very important that you work harder with diet by avoiding all foods that are white. Avoid white rice (brown & wild rice is OK), white potatoes (sweetpotatoes in moderation is OK), White bread or wheat bread or anything made out of white flour like bagels, donuts, rolls, buns, biscuits, cakes, pastries, cookies, pizza crust, and pasta (made from white flour & egg whites) - vegetarian pasta or spinach or wheat pasta is OK. Multigrain breads like Arnold's or [employer], or multigrain sandwich thins or flatbreads.  Diet, exercise and weight loss can reverse and cure diabetes in the early stages.  Diet, exercise and weight loss is very important in the control and prevention of complications of diabetes which affects every system in your body, ie. Brain - dementia/stroke, eyes - glaucoma/blindness, heart - heart attack/heart failure, kidneys - dialysis, stomach - gastric paralysis, intestines - malabsorption, nerves - severe painful neuritis, circulation - gangrene & loss of a leg(s), and finally cancer and Alzheimers. ° °  I recommend avoid fried & greasy foods,  sweets/candy, white rice (brown or wild rice or Quinoa is OK), white potatoes (sweet potatoes are OK) - anything made from white flour - bagels, doughnuts, rolls, buns, biscuits,white and wheat breads, pizza crust and traditional pasta made of white flour & egg white(vegetarian pasta or spinach or wheat pasta is OK).  Multi-grain bread is OK - like multi-grain flat bread or sandwich thins. Avoid alcohol in excess. Exercise is also important. ° °  Eat all the vegetables you want - avoid meat, especially red meat and dairy - especially cheese.  Cheese is the most concentrated form of trans-fats which is the worst thing to clog up our arteries. Veggie cheese is OK which can be found  in the fresh produce section at [employer] or [employer] or [employer] ° °++++++++++++++++++++++ °DASH Eating Plan ° °DASH stands for "Dietary Approaches to Stop Hypertension."  ° °The DASH eating plan is a healthy eating plan that has been shown to reduce high blood pressure (hypertension). Additional health benefits may include reducing the risk of type 2 diabetes mellitus, heart disease, and stroke. The DASH eating plan may also help with weight loss. °WHAT DO I NEED TO KNOW ABOUT THE DASH EATING PLAN? °For the DASH eating plan, you will follow these general guidelines: °· Choose foods with a percent daily value for sodium of less than 5% (as listed on the food label). °· Use salt-free seasonings or herbs instead of table salt or sea salt. °· Check with your health care provider or pharmacist before using salt substitutes. °· Eat lower-sodium products, often labeled as "lower sodium" or "no salt added." °· Eat fresh foods. °· Eat more vegetables, fruits, and low-fat dairy products. °· Choose whole grains. Look for the word "whole" as the first word in the ingredient list. °· Choose fish  °· Limit sweets, desserts, sugars, and sugary drinks. °· Choose heart-healthy fats. °·   Eat veggie cheese  °· Eat more home-cooked food and less restaurant, buffet, and fast food. °· Limit fried foods. °· Cook foods using methods other than frying. °· Limit canned vegetables. If you do use them, rinse them well to decrease the sodium. °· When eating at a restaurant, ask that your food be prepared with less salt, or no salt if possible. °                  °   WHAT FOODS CAN I EAT? °Read Dr Joel Fuhrman's books on The End of Dieting & The End of Diabetes ° °Grains °Whole grain or whole wheat bread. Brown rice. Whole grain or whole wheat pasta. Quinoa, bulgur, and whole grain cereals. Low-sodium cereals. Corn or whole wheat flour tortillas. Whole grain cornbread. Whole grain crackers. Low-sodium crackers. ° °Vegetables °Fresh or  frozen vegetables (raw, steamed, roasted, or grilled). Low-sodium or reduced-sodium tomato and vegetable juices. Low-sodium or reduced-sodium tomato sauce and paste. Low-sodium or reduced-sodium canned vegetables.  ° °Fruits °All fresh, canned (in natural juice), or frozen fruits. ° °Protein Products ° All fish and seafood.  Dried beans, peas, or lentils. Unsalted nuts and seeds. Unsalted canned beans. ° °Dairy °Low-fat dairy products, such as skim or 1% milk, 2% or reduced-fat cheeses, low-fat ricotta or cottage cheese, or plain low-fat yogurt. Low-sodium or reduced-sodium cheeses. ° °Fats and Oils °Tub margarines without trans fats. Light or reduced-fat mayonnaise and salad dressings (reduced sodium). Avocado. Safflower, olive, or canola oils. Natural peanut or almond butter. ° °Other °Unsalted popcorn and pretzels. °The items listed above may not be a complete list of recommended foods or beverages. Contact your dietitian for more options. ° °++++++++++++++++++++ ° °WHAT FOODS ARE NOT RECOMMENDED? °Grains/ White flour or wheat flour °White bread. White pasta. White rice. Refined cornbread. Bagels and croissants. Crackers that contain trans fat. ° °Vegetables ° °Creamed or fried vegetables. Vegetables in a . Regular canned vegetables. Regular canned tomato sauce and paste. Regular tomato and vegetable juices. ° °Fruits °Dried fruits. Canned fruit in light or heavy syrup. Fruit juice. ° °Meat and Other Protein Products °Meat in general - RED meat & White meat.  Fatty cuts of meat. Ribs, chicken wings, all processed meats as bacon, sausage, bologna, salami, fatback, hot dogs, bratwurst and packaged luncheon meats. ° °Dairy °Whole or 2% milk, cream, half-and-half, and cream cheese. Whole-fat or sweetened yogurt. Full-fat cheeses or blue cheese. Non-dairy creamers and whipped toppings. Processed cheese, cheese spreads, or cheese curds. ° °Condiments °Onion and garlic salt, seasoned salt, table salt, and sea salt.  Canned and packaged gravies. Worcestershire sauce. Tartar sauce. Barbecue sauce. Teriyaki sauce. Soy sauce, including reduced sodium. Steak sauce. Fish sauce. Oyster sauce. Cocktail sauce. Horseradish. Ketchup and mustard. Meat flavorings and tenderizers. Bouillon cubes. Hot sauce. Tabasco sauce. Marinades. Taco seasonings. Relishes. ° °Fats and Oils °Butter, stick margarine, lard, shortening and bacon fat. Coconut, palm kernel, or palm oils. Regular salad dressings. ° °Pickles and olives. Salted popcorn and pretzels. ° °The items listed above may not be a complete list of foods and beverages to avoid. ° ° ° °

## 2020-05-20 ENCOUNTER — Other Ambulatory Visit: Payer: Self-pay

## 2020-05-20 ENCOUNTER — Ambulatory Visit (INDEPENDENT_AMBULATORY_CARE_PROVIDER_SITE_OTHER): Payer: Medicare HMO | Admitting: Internal Medicine

## 2020-05-20 VITALS — BP 126/64 | HR 60 | Temp 97.5°F | Resp 16 | Ht 71.8 in | Wt 199.4 lb

## 2020-05-20 DIAGNOSIS — Z0000 Encounter for general adult medical examination without abnormal findings: Secondary | ICD-10-CM

## 2020-05-20 DIAGNOSIS — I10 Essential (primary) hypertension: Secondary | ICD-10-CM

## 2020-05-20 DIAGNOSIS — Z136 Encounter for screening for cardiovascular disorders: Secondary | ICD-10-CM | POA: Diagnosis not present

## 2020-05-20 DIAGNOSIS — Z1211 Encounter for screening for malignant neoplasm of colon: Secondary | ICD-10-CM

## 2020-05-20 DIAGNOSIS — E782 Mixed hyperlipidemia: Secondary | ICD-10-CM

## 2020-05-20 DIAGNOSIS — N1832 Chronic kidney disease, stage 3b: Secondary | ICD-10-CM

## 2020-05-20 DIAGNOSIS — Z8249 Family history of ischemic heart disease and other diseases of the circulatory system: Secondary | ICD-10-CM | POA: Diagnosis not present

## 2020-05-20 DIAGNOSIS — R7309 Other abnormal glucose: Secondary | ICD-10-CM

## 2020-05-20 DIAGNOSIS — Z8546 Personal history of malignant neoplasm of prostate: Secondary | ICD-10-CM

## 2020-05-20 DIAGNOSIS — Z87891 Personal history of nicotine dependence: Secondary | ICD-10-CM

## 2020-05-20 DIAGNOSIS — E559 Vitamin D deficiency, unspecified: Secondary | ICD-10-CM

## 2020-05-20 DIAGNOSIS — Z0001 Encounter for general adult medical examination with abnormal findings: Secondary | ICD-10-CM

## 2020-05-20 DIAGNOSIS — Z125 Encounter for screening for malignant neoplasm of prostate: Secondary | ICD-10-CM

## 2020-05-20 DIAGNOSIS — Z1212 Encounter for screening for malignant neoplasm of rectum: Secondary | ICD-10-CM

## 2020-05-20 DIAGNOSIS — I700 Atherosclerosis of aorta: Secondary | ICD-10-CM

## 2020-05-20 DIAGNOSIS — G250 Essential tremor: Secondary | ICD-10-CM

## 2020-05-20 DIAGNOSIS — K219 Gastro-esophageal reflux disease without esophagitis: Secondary | ICD-10-CM

## 2020-05-20 DIAGNOSIS — Z79899 Other long term (current) drug therapy: Secondary | ICD-10-CM

## 2020-05-20 NOTE — Progress Notes (Signed)
AortaScan <3 cm. Within normal limits per Dr. McKeown. 

## 2020-05-20 NOTE — Progress Notes (Signed)
AortaScan

## 2020-05-21 LAB — URINALYSIS, ROUTINE W REFLEX MICROSCOPIC
Bilirubin Urine: NEGATIVE
Glucose, UA: NEGATIVE
Hgb urine dipstick: NEGATIVE
Ketones, ur: NEGATIVE
Leukocytes,Ua: NEGATIVE
Nitrite: NEGATIVE
Protein, ur: NEGATIVE
Specific Gravity, Urine: 1.023 (ref 1.001–1.03)
pH: 5.5 (ref 5.0–8.0)

## 2020-05-21 LAB — COMPLETE METABOLIC PANEL WITH GFR
AG Ratio: 2 (calc) (ref 1.0–2.5)
ALT: 10 U/L (ref 9–46)
AST: 19 U/L (ref 10–35)
Albumin: 4.3 g/dL (ref 3.6–5.1)
Alkaline phosphatase (APISO): 35 U/L (ref 35–144)
BUN/Creatinine Ratio: 12 (calc) (ref 6–22)
BUN: 18 mg/dL (ref 7–25)
CO2: 24 mmol/L (ref 20–32)
Calcium: 9.8 mg/dL (ref 8.6–10.3)
Chloride: 110 mmol/L (ref 98–110)
Creat: 1.51 mg/dL — ABNORMAL HIGH (ref 0.70–1.18)
GFR, Est African American: 52 mL/min/{1.73_m2} — ABNORMAL LOW (ref >=60)
GFR, Est Non African American: 45 mL/min/{1.73_m2} — ABNORMAL LOW (ref >=60)
Globulin: 2.1 g/dL (calc) (ref 1.9–3.7)
Glucose, Bld: 99 mg/dL (ref 65–99)
Potassium: 4.4 mmol/L (ref 3.5–5.3)
Sodium: 144 mmol/L (ref 135–146)
Total Bilirubin: 0.7 mg/dL (ref 0.2–1.2)
Total Protein: 6.4 g/dL (ref 6.1–8.1)

## 2020-05-21 LAB — PTH, INTACT AND CALCIUM
Calcium: 9.8 mg/dL (ref 8.6–10.3)
PTH: 22 pg/mL (ref 16–77)

## 2020-05-21 LAB — CBC WITH DIFFERENTIAL/PLATELET
Absolute Monocytes: 709 cells/uL (ref 200–950)
Basophils Absolute: 33 cells/uL (ref 0–200)
Basophils Relative: 0.5 %
Eosinophils Absolute: 98 cells/uL (ref 15–500)
Eosinophils Relative: 1.5 %
HCT: 52.7 % — ABNORMAL HIGH (ref 38.5–50.0)
Hemoglobin: 17.7 g/dL — ABNORMAL HIGH (ref 13.2–17.1)
Lymphs Abs: 1658 cells/uL (ref 850–3900)
MCH: 33.8 pg — ABNORMAL HIGH (ref 27.0–33.0)
MCHC: 33.6 g/dL (ref 32.0–36.0)
MCV: 100.8 fL — ABNORMAL HIGH (ref 80.0–100.0)
MPV: 10.4 fL (ref 7.5–12.5)
Monocytes Relative: 10.9 %
Neutro Abs: 4004 cells/uL (ref 1500–7800)
Neutrophils Relative %: 61.6 %
Platelets: 217 10*3/uL (ref 140–400)
RBC: 5.23 10*6/uL (ref 4.20–5.80)
RDW: 12.4 % (ref 11.0–15.0)
Total Lymphocyte: 25.5 %
WBC: 6.5 10*3/uL (ref 3.8–10.8)

## 2020-05-21 LAB — MAGNESIUM: Magnesium: 2.1 mg/dL (ref 1.5–2.5)

## 2020-05-21 LAB — LIPID PANEL
Cholesterol: 150 mg/dL (ref <200)
HDL: 39 mg/dL — ABNORMAL LOW (ref >=40)
LDL Cholesterol (Calc): 77 mg/dL (calc)
Non-HDL Cholesterol (Calc): 111 mg/dL (calc) (ref <130)
Total CHOL/HDL Ratio: 3.8 (calc) (ref <5.0)
Triglycerides: 257 mg/dL — ABNORMAL HIGH (ref <150)

## 2020-05-21 LAB — MICROALBUMIN / CREATININE URINE RATIO
Creatinine, Urine: 164 mg/dL (ref 20–320)
Microalb, Ur: <0.2 mg/dL

## 2020-05-21 LAB — HEMOGLOBIN A1C
Hgb A1c MFr Bld: 5.4 % of total Hgb (ref <5.7)
Mean Plasma Glucose: 108 mg/dL
eAG (mmol/L): 6 mmol/L

## 2020-05-21 LAB — VITAMIN D 25 HYDROXY (VIT D DEFICIENCY, FRACTURES): Vit D, 25-Hydroxy: 52 ng/mL (ref 30–100)

## 2020-05-21 LAB — PSA: PSA: <0.04 ng/mL (ref <=4.0)

## 2020-05-21 LAB — INSULIN, RANDOM: Insulin: 47.2 u[IU]/mL — ABNORMAL HIGH

## 2020-05-21 LAB — TSH: TSH: 3.64 mIU/L (ref 0.40–4.50)

## 2020-05-21 NOTE — Progress Notes (Signed)
============================================================ ============================================================  -    PSA - Undetectable - Great  ============================================================ ============================================================  -  PTH -  Hormone that regulates calcium balance is Normal  ============================================================ ============================================================  -  Kidney Functions back up from Stage 3b to Stage 3a great  ============================================================ ============================================================  -  Total Chol = 150 and LDL Chol = 77 - Both  Excellent   - Very low risk for Heart Attack  / Stroke ========================================================  - But Triglycerides (   257   ) or fats in blood are too high  (goal is less than 150)    - Recommend avoid fried & greasy foods,  sweets / candy,   - Avoid white rice  (brown or wild rice or Quinoa is OK),   - Avoid white potatoes  (sweet potatoes are OK)   - Avoid anything made from white flour  - bagels, doughnuts, rolls, buns, biscuits, white and   wheat breads, pizza crust and traditional  pasta made of white flour & egg white  - (vegetarian pasta or spinach or wheat pasta is OK).    - Multi-grain bread is OK - like multi-grain flat bread or  sandwich thins.   - Avoid alcohol in excess.   - Exercise is also important. ============================================================ ============================================================  -  A1c - Normal - Great - No Diabetes ============================================================ ============================================================  -  Vitamin D = 52 - a little low  - be sure not missing doses of Vit  D ============================================================ ============================================================  -  All Else - CBC - Electrolytes - Liver - Magnesium & Thyroid    - all  Normal / OK ============================================================ ============================================================

## 2020-05-26 ENCOUNTER — Other Ambulatory Visit: Payer: Self-pay | Admitting: Adult Health

## 2020-05-26 ENCOUNTER — Other Ambulatory Visit (HOSPITAL_COMMUNITY): Payer: Self-pay

## 2020-05-26 DIAGNOSIS — E782 Mixed hyperlipidemia: Secondary | ICD-10-CM

## 2020-05-26 MED ORDER — FENOFIBRATE MICRONIZED 134 MG PO CAPS
134.0000 mg | ORAL_CAPSULE | Freq: Every day | ORAL | 3 refills | Status: DC
  Filled 2020-05-26: qty 90, 90d supply, fill #0
  Filled 2020-09-04: qty 90, 90d supply, fill #1
  Filled 2020-12-11: qty 90, 90d supply, fill #2
  Filled 2021-03-22: qty 90, 90d supply, fill #3

## 2020-06-19 ENCOUNTER — Other Ambulatory Visit: Payer: Self-pay | Admitting: Internal Medicine

## 2020-06-19 ENCOUNTER — Other Ambulatory Visit (HOSPITAL_COMMUNITY): Payer: Self-pay

## 2020-06-19 MED ORDER — EZETIMIBE 10 MG PO TABS
ORAL_TABLET | ORAL | 3 refills | Status: DC
  Filled 2020-06-19: qty 90, 90d supply, fill #0
  Filled 2020-10-05: qty 90, 90d supply, fill #1
  Filled 2021-01-06: qty 90, 90d supply, fill #2
  Filled 2021-04-15: qty 90, 90d supply, fill #3

## 2020-06-19 MED FILL — Propranolol HCl Cap ER 24HR 120 MG: ORAL | 90 days supply | Qty: 90 | Fill #0 | Status: AC

## 2020-07-07 MED FILL — Rosuvastatin Calcium Tab 40 MG: ORAL | 90 days supply | Qty: 90 | Fill #0 | Status: AC

## 2020-07-08 ENCOUNTER — Other Ambulatory Visit (HOSPITAL_COMMUNITY): Payer: Self-pay

## 2020-08-04 ENCOUNTER — Ambulatory Visit: Payer: Medicare HMO | Admitting: Adult Health

## 2020-09-04 ENCOUNTER — Other Ambulatory Visit (HOSPITAL_COMMUNITY): Payer: Self-pay

## 2020-10-05 ENCOUNTER — Other Ambulatory Visit (HOSPITAL_COMMUNITY): Payer: Self-pay

## 2020-10-19 ENCOUNTER — Other Ambulatory Visit (HOSPITAL_COMMUNITY): Payer: Self-pay

## 2020-10-19 MED FILL — Propranolol HCl Cap ER 24HR 120 MG: ORAL | 90 days supply | Qty: 90 | Fill #1 | Status: AC

## 2020-11-03 ENCOUNTER — Other Ambulatory Visit (HOSPITAL_COMMUNITY): Payer: Self-pay

## 2020-11-03 MED FILL — Rosuvastatin Calcium Tab 40 MG: ORAL | 90 days supply | Qty: 90 | Fill #1 | Status: AC

## 2020-12-10 NOTE — Progress Notes (Signed)
MEDICARE ANNUAL WELLNESS VISIT AND FOLLOW UP Assessment:   Juan Torres was seen today for medicare wellness and follow-up.  Diagnoses and all orders for this visit:  Annual Medicare Wellness Visit Due annually  Health maintenance reviewed  Atherosclerosis of aorta ([place]) Per CT 2019 Control blood pressure, cholesterol, glucose, increase exercise.   Centrilobular emphysema (HCC) Mild, by imaging; patient denies sx; patient did quit smoking  Essential hypertension Continue medication Monitor blood pressure at home; call if consistently over 130/80 Continue DASH diet.   Reminder to go to the ER if any CP, SOB, nausea, dizziness, severe HA, changes vision/speech, left arm numbness and tingling and jaw pain. -     CMP WITH GFR  Mixed hyperlipidemia -     Continue medication: rosuvastatin 40 mg, fenofibrate 134 mg  -     LDL at goal; lifestyle for trigs discussed -     CMP/GFR -     TSH -     Lipid panel  Other abnormal glucose       -     Recent A1Cs at goal Discussed diet/exercise, weight management  Defer A1C; check CMP  Vitamin D deficiency/ osteoporosis prophylaxis -     continue to recommend supplementation  -     Defer vitamin D level  Gastroesophageal reflux disease, esophagitis presence not specified Symptoms well managed without breakthrough Will try to get off PPI given info for taper and famotidine sent in  Hx of prostate cancer       -     PSA review stable, last undetectable       -     Was released by urology        -     No urinary symptoms, continue monitoring  CKD III (HCC)      -      Increase fluids, avoid NSAIDS, monitor sugars, will monitor      -      CMP/GFR  Former smoker - 30 pack year, quit 2020     -       Low-dose screening CT annually - ordered to schedule  History of skin cancer Continue close follow up by dermatology  Familial tremor Continue propranolol, avoid excess alcohol, monitor   Excess alcohol intake Limit to no more than 1-2  drinks/day patient has done well reducing intake  OSA on CPAP Mild per sleep study 2020 by Dr. Brett Fairy On CPAP, reduced alcohol  Orders Placed This Encounter  Procedures   CT CHEST LUNG CA SCREEN LOW DOSE W/O CM   CBC with Differential/Platelet   COMPLETE METABOLIC PANEL WITH GFR   Magnesium   Lipid panel   TSH      Over 30 minutes of exam, counseling, chart review, and critical decision making was performed  Future Appointments  Date Time Provider [place]  02/11/2021  9:30 AM Ward Givens, NP [facility] None  05/25/2021 10:00 AM Unk Pinto, MD [facility] None  12/14/2021 11:00 AM Liane Comber, NP [facility] None     Plan:   During the course of the visit the patient was educated and counseled about appropriate screening and preventive services including:   Pneumococcal vaccine  Influenza vaccine Prevnar 13 Td vaccine Screening electrocardiogram Colorectal cancer screening Diabetes screening Glaucoma screening Nutrition counseling    Subjective:  Juan Torres is a 75 y.o. male who presents for Medicare Annual Wellness Visit and 3 month follow up. He has History of prostate cancer; GERD (gastroesophageal reflux disease); Hyperlipidemia, mixed;  Essential hypertension; Vitamin D deficiency; Medication management; BMI 24.0-24.9, adult; Former smoker (quit 12/11/2018, 30+ pack year history); CKD (chronic kidney disease) stage 3, GFR 30-59 ml/min ([place]); Abnormal glucose; Thoracic aorta atherosclerosis ([place]) by CT scan  09/12/2018; Emphysema lung ([place]); OSA on CPAP; Excessive drinking of alcohol; Other secondary hypertension; History of skin cancer; Familial tremor; and Hepatic steatosis on their problem list.  He was diagnosed with mild OSA in 2020, reports wearing CPAP nightly, endorses 100% compliance with restorative sleep.   He had tremor, was taking valium but stopped after he quit excess alcohol (was drinking a fifth daily, now down to occasional glass  of red wine). Doing well with propanolol.    he quit smoking Nov 2020; COPD changes per imaging (CT 01/09/2020). Denies sx. Has 30+ pack/year smoking hx, is undergoing annual low dose lung CT screenings. Last in 01/09/2020 with stable/unchanged findings.   he has a diagnosis of GERD which is currently managed by OTC omeprazole daily. Denies breakthrough sx.   BMI is Body mass index is 26.73 kg/m., he has been working on diet and exercise. The patient has been very active over the past year- taking walks ~1 hr with grandson, active in garden.  Wt Readings from Last 3 Encounters:  12/11/20 196 lb (88.9 kg)  05/20/20 199 lb 6.4 oz (90.4 kg)  02/12/20 198 lb (89.8 kg)   His blood pressure has been controlled at home, today their BP is BP: 136/72  He does workout. He denies chest pain, shortness of breath, dizziness.  He has aortic atherosclerosis per CT 12/2018   He is on cholesterol medication (rosuvastatin 40 mg daily, fenofibrate 134 mg daily, fish oil) and denies myalgias. His LDL cholesterol is at goal, trigs remain elevated. The cholesterol last visit was:   Lab Results  Component Value Date   CHOL 150 05/20/2020   HDL 39 (L) 05/20/2020   LDLCALC 77 05/20/2020   TRIG 257 (H) 05/20/2020   CHOLHDL 3.8 05/20/2020    He has been working on diet and exercise for glucose management, and denies hyperglycemia, hypoglycemia , nausea, paresthesia of the feet, polydipsia and polyuria. Last A1C in the office was:  Lab Results  Component Value Date   HGBA1C 5.4 05/20/2020   He has CKD III followed our office, has seen Dr. Justin Mend in the past. Last GFR was:  Lab Results  Component Value Date   GFRNONAA 45 (L) 05/20/2020   GFRNONAA 36 (L) 01/30/2020   GFRNONAA 43 (L) 10/30/2019   Patient is on Vitamin D supplement (taking 15000 IU daily):  Lab Results  Component Value Date   VD25OH 52 05/20/2020     Patient was treated in 2014 for Prostate Ca with Brachytherapy, was released by urology.   Lab Results  Component Value Date   PSA <0.04 05/20/2020   PSA <0.1 04/15/2019   PSA <0.1 03/08/2018      Medication Review: Current Outpatient Medications on File Prior to Visit  Medication Sig Dispense Refill   B Complex Vitamins (VITAMIN B-COMPLEX PO) Take 1 tablet by mouth daily.     Cholecalciferol (VITAMIN D3) 125 MCG (5000 UT) TABS Take 15,000 Units by mouth every morning.     ezetimibe (ZETIA) 10 MG tablet Take 1 tablet by mouth daily for cholesterol. 90 tablet 3   fenofibrate micronized (LOFIBRA) 134 MG capsule Take 1 capsule Daily for Triglycerides (Blood Fats) 90 capsule 3   Magnesium 400 MG CAPS Take 400 mg by mouth every evening.  Omega-3 Fatty Acids (FISH OIL) 1000 MG CAPS Take 1,000 mg by mouth every morning.     omeprazole (PRILOSEC) 10 MG capsule Take 10 mg by mouth daily.     propranolol ER (INDERAL LA) 120 MG 24 hr capsule Take 1 capsule (120 mg total) by mouth daily for blood pressure and tremor. 90 capsule 3   rosuvastatin (CRESTOR) 40 MG tablet TAKE 1 TABLET BY MOUTH DAILY FOR CHOLESTEROL 90 tablet 3   No current facility-administered medications on file prior to visit.    Allergies: Allergies  Allergen Reactions   Lipitor [Atorvastatin] Other (See Comments)    fatigued   Niacin And Related Other (See Comments)    Flushing   Penicillins Rash    Current Problems (verified) has History of prostate cancer; GERD (gastroesophageal reflux disease); Hyperlipidemia, mixed; Essential hypertension; Vitamin D deficiency; Medication management; BMI 24.0-24.9, adult; Former smoker (quit 12/11/2018, 30+ pack year history); CKD (chronic kidney disease) stage 3, GFR 30-59 ml/min ([place]); Abnormal glucose; Thoracic aorta atherosclerosis ([place]) by CT scan  09/12/2018; Emphysema lung ([place]); OSA on CPAP; Excessive drinking of alcohol; Other secondary hypertension; History of skin cancer; Familial tremor; and Hepatic steatosis on their problem list.  Screening  Tests Immunization History  Administered Date(s) Administered   DT (Pediatric) 10/20/2011   PFIZER(Purple Top)SARS-COV-2 Vaccination 04/04/2019, 04/30/2019   Pneumococcal Conjugate-13 11/14/2017   Pneumococcal Polysaccharide-23 04/28/2016   Zoster, Live 10/20/2011   Preventative care: Last colonoscopy: 12/02/2011 due 2023 Chest CT screen: 01/09/2020 - benign nodules, aortic atherosclerosis, emphysema , next ordered   Prior vaccinations: TD or Tdap: 2013  Influenza: DUE declines  Pneumococcal 23: 04/28/2016 Prevnar13: 11/2017 Shingles/Zostavax: 10/20/2011 Covid 19: 2/2, 2021, pfizer  Names of Other Physician/Practitioners you currently use: 1. [facility] here for primary care 2. Dr. Katy Fitch, eye doctor, last visit  2021, has laser procedure scheduled 3. Dr. Mariea Clonts, dentist, last visit 2022, goes q57m 4. [facility], last visit 2022, goes annually, has upcoming appointment   Patient Care Team: Unk Pinto, MD as PCP - General (Internal Medicine) Arloa Koh, MD (Inactive) as Consulting Physician (Radiation Oncology) Clent Jacks, MD as Consulting Physician (Ophthalmology) Edrick Oh, MD as Consulting Physician (Nephrology)  Surgical: He  has a past surgical history that includes Prostate biopsy (12/19/2011); Colonoscopy w/ polypectomy; Radioactive seed implant (N/A, 04/04/2012); Cystoscopy (N/A, 04/04/2012); Eye surgery; and Inguinal hernia repair (Bilateral, 10/18/2018). Family His family history includes Breast cancer in his mother; Cancer in his maternal grandfather; Dementia in his father; Heart attack (age of onset: 82) in his father; Prostate cancer in his cousin and maternal uncle; Ulcers in his maternal grandmother. Social history  He reports that he quit smoking about 2 years ago. His smoking use included cigarettes. He has a 30.00 pack-year smoking history. He has never used smokeless tobacco. He reports that he does  not currently use alcohol. He reports current drug use. Frequency: 7.00 times per week. Drug: Marijuana.  MEDICARE WELLNESS OBJECTIVES: Physical activity: Current Exercise Habits: Home exercise routine, Type of exercise: walking, Time (Minutes): 60, Frequency (Times/Week): 4, Weekly Exercise (Minutes/Week): 240, Intensity: Mild, Exercise limited by: None identified Cardiac risk factors: Cardiac Risk Factors include: smoking/ tobacco exposure;male gender;hypertension;dyslipidemia Depression/mood screen:   Depression screen [facility] 2/9 12/11/2020  Decreased Interest 0  Down, Depressed, Hopeless 0  PHQ - 2 Score 0    ADLs:  In your present state of health, do you have any difficulty performing the following activities: 12/11/2020 05/19/2020  Hearing? N N  Vision? N N  Difficulty concentrating or making decisions? N N  Walking or climbing stairs? N N  Dressing or bathing? N N  Doing errands, shopping? N N  Some recent data might be hidden     Cognitive Testing  Alert? Yes  Normal Appearance?Yes  Oriented to person? Yes  Place? Yes   Time? Yes  Recall of three objects?  Yes  Can perform simple calculations? Yes  Displays appropriate judgment?Yes  Can read the correct time from a watch face?Yes  EOL planning: Does Patient Have a Medical Advance Directive?: No Would patient like information on creating a medical advance directive?: Yes (MAU/Ambulatory/Procedural Areas - Information given)   Review of Systems  Constitutional:  Negative for malaise/fatigue and weight loss.  HENT:  Negative for hearing loss and tinnitus.   Eyes:  Negative for blurred vision and double vision.  Respiratory:  Negative for cough, sputum production, shortness of breath and wheezing.   Cardiovascular:  Negative for chest pain, palpitations, orthopnea, claudication, leg swelling and PND.  Gastrointestinal:  Negative for abdominal pain, blood in stool, constipation, diarrhea, heartburn, melena, nausea and vomiting.   Genitourinary: Negative.   Musculoskeletal:  Negative for falls, joint pain and myalgias.  Skin:  Negative for rash.  Neurological:  Negative for dizziness, tingling, tremors (improved), sensory change, weakness and headaches.  Endo/Heme/Allergies:  Negative for polydipsia.  Psychiatric/Behavioral: Negative.  Negative for depression, memory loss, substance abuse and suicidal ideas. The patient is not nervous/anxious and does not have insomnia.   All other systems reviewed and are negative.   Objective:   Today's Vitals   12/11/20 1033  BP: 136/72  Pulse: (!) 50  Temp: (!) 97.5 F (36.4 C)  SpO2: 98%  Weight: 196 lb (88.9 kg)   Body mass index is 26.73 kg/m.  General appearance: alert, red through face/neck , no distress, WD/WN, male HEENT: normocephalic, sclerae anicteric, TMs pearly, nares patent, no discharge or erythema, pharynx normal, normal hearing with bilateral hearing aids Oral cavity: MMM, no lesions Neck: supple, no lymphadenopathy, no thyromegaly, no masses Heart: RRR, normal S1, S2, no murmurs Lungs: Symmetrical, clear, not diminished, no wheezes, rhonchi, or rales Abdomen: +bs, soft,  non distended,  No palpable organomegaly or hernias Musculoskeletal: No deformity, no swelling Extremities: no edema, no cyanosis, no clubbing Pulses: 2+ symmetric, upper and lower extremities, normal cap refill Neurological: alert, oriented x 3, CN2-12 intact, strength normal upper extremities and lower extremities, sensation normal throughout, DTRs 2+ throughout, no cerebellar signs, gait normal. No tremor.  Psychiatric: normal affect, behavior normal, pleasant   Medicare Attestation I have personally reviewed: The patient's medical and social history Their use of alcohol, tobacco or illicit drugs Their current medications and supplements The patient's functional ability including ADLs,fall risks, home safety risks, cognitive, and hearing and visual impairment Diet and  physical activities Evidence for depression or mood disorders  The patient's weight, height, BMI, and visual acuity have been recorded in the chart.  I have made referrals, counseling, and provided education to the patient based on review of the above and I have provided the patient with a written personalized care plan for preventive services.     Izora Ribas, NP   12/11/2020

## 2020-12-11 ENCOUNTER — Other Ambulatory Visit: Payer: Self-pay

## 2020-12-11 ENCOUNTER — Ambulatory Visit (INDEPENDENT_AMBULATORY_CARE_PROVIDER_SITE_OTHER): Payer: Medicare HMO | Admitting: Adult Health

## 2020-12-11 ENCOUNTER — Other Ambulatory Visit (HOSPITAL_COMMUNITY): Payer: Self-pay

## 2020-12-11 ENCOUNTER — Encounter: Payer: Self-pay | Admitting: Adult Health

## 2020-12-11 VITALS — BP 136/72 | HR 50 | Temp 97.5°F | Wt 196.0 lb

## 2020-12-11 DIAGNOSIS — G250 Essential tremor: Secondary | ICD-10-CM

## 2020-12-11 DIAGNOSIS — J432 Centrilobular emphysema: Secondary | ICD-10-CM | POA: Diagnosis not present

## 2020-12-11 DIAGNOSIS — I10 Essential (primary) hypertension: Secondary | ICD-10-CM | POA: Diagnosis not present

## 2020-12-11 DIAGNOSIS — E782 Mixed hyperlipidemia: Secondary | ICD-10-CM

## 2020-12-11 DIAGNOSIS — R6889 Other general symptoms and signs: Secondary | ICD-10-CM | POA: Diagnosis not present

## 2020-12-11 DIAGNOSIS — F1010 Alcohol abuse, uncomplicated: Secondary | ICD-10-CM

## 2020-12-11 DIAGNOSIS — Z9989 Dependence on other enabling machines and devices: Secondary | ICD-10-CM

## 2020-12-11 DIAGNOSIS — Z6824 Body mass index (BMI) 24.0-24.9, adult: Secondary | ICD-10-CM

## 2020-12-11 DIAGNOSIS — Z0000 Encounter for general adult medical examination without abnormal findings: Secondary | ICD-10-CM

## 2020-12-11 DIAGNOSIS — Z87891 Personal history of nicotine dependence: Secondary | ICD-10-CM

## 2020-12-11 DIAGNOSIS — N1832 Chronic kidney disease, stage 3b: Secondary | ICD-10-CM

## 2020-12-11 DIAGNOSIS — Z8546 Personal history of malignant neoplasm of prostate: Secondary | ICD-10-CM

## 2020-12-11 DIAGNOSIS — Z79899 Other long term (current) drug therapy: Secondary | ICD-10-CM

## 2020-12-11 DIAGNOSIS — I700 Atherosclerosis of aorta: Secondary | ICD-10-CM

## 2020-12-11 DIAGNOSIS — K219 Gastro-esophageal reflux disease without esophagitis: Secondary | ICD-10-CM

## 2020-12-11 DIAGNOSIS — G4733 Obstructive sleep apnea (adult) (pediatric): Secondary | ICD-10-CM

## 2020-12-11 DIAGNOSIS — K760 Fatty (change of) liver, not elsewhere classified: Secondary | ICD-10-CM

## 2020-12-11 DIAGNOSIS — R7309 Other abnormal glucose: Secondary | ICD-10-CM

## 2020-12-11 DIAGNOSIS — Z0001 Encounter for general adult medical examination with abnormal findings: Secondary | ICD-10-CM

## 2020-12-11 DIAGNOSIS — E559 Vitamin D deficiency, unspecified: Secondary | ICD-10-CM

## 2020-12-11 DIAGNOSIS — Z85828 Personal history of other malignant neoplasm of skin: Secondary | ICD-10-CM

## 2020-12-11 MED ORDER — FAMOTIDINE 20 MG PO TABS
20.0000 mg | ORAL_TABLET | Freq: Two times a day (BID) | ORAL | 0 refills | Status: DC
  Filled 2020-12-11: qty 180, 90d supply, fill #0

## 2020-12-11 NOTE — Patient Instructions (Signed)
   GETTING OFF OF PPI's    Nexium/protonix/prilosec/Omeprazole/Dexilant/Aciphex are called PPI's, they are great at healing your stomach.  Taken for a long time they  can increase the risk of osteoporosis (weakening of your bones), pneumonia, low magnesium, restless legs, Cdiff (infection that causes diarrhea).  These are things we can treat or monitor for because some people will need to remain on therapy if you have a hiatal hernia, previous GI bleed, barret's esophagus, etc.   If you do not have a condition where we feel you need to take this long term we can work to get off the proton pump inhibitor.   So this is how we want you to get off the PPI: Generic is always fine!!  - Start taking the nexium/protonix/prilosec/PPI  every other day with pepcid famotadine 2 x a day for 2-4 weeks - some people stay on this dosage and can not taper off further.   - then decrease the PPI to every 3 days while taking the pepcid twice a day the other  days for 2-4  Weeks  - then every fourth day ... if doing well at this point can stop the PPI completely  - then you can try the pepcid/famotidine once daily, or as needed  - you can continue on this once at night or stop all together  - Avoid alcohol, spicy foods, NSAIDS (aleve, ibuprofen) at this time. See foods below.   +++++++++++++++++++++++++++++++++++++++++++  Food Choices for Gastroesophageal Reflux Disease  When you have gastroesophageal reflux disease (GERD), the foods you eat and your eating habits are very important. Choosing the right foods can help ease the discomfort of GERD. WHAT GENERAL GUIDELINES DO I NEED TO FOLLOW? Choose fruits, vegetables, whole grains, low-fat dairy products, and low-fat meat, fish, and poultry. Limit fats such as oils, salad dressings, butter, nuts, and avocado. Keep a food diary to identify foods that cause symptoms. Avoid foods that cause reflux. These may be different for different people. Eat frequent  small meals instead of three large meals each day. Eat your meals slowly, in a relaxed setting. Limit fried foods. Cook foods using methods other than frying. Avoid drinking alcohol. Avoid drinking large amounts of liquids with your meals. Avoid bending over or lying down until 2-3 hours after eating.  WHAT FOODS ARE NOT RECOMMENDED? The following are some foods and drinks that may worsen your symptoms:  Vegetables Tomatoes. Tomato juice. Tomato and spaghetti sauce. Chili peppers. Onion and garlic. Horseradish. Fruits Oranges, grapefruit, and lemon (fruit and juice). Meats High-fat meats, fish, and poultry. This includes hot dogs, ribs, ham, sausage, salami, and bacon. Dairy Whole milk and chocolate milk. Sour cream. Cream. Butter. Ice cream. Cream cheese.  Beverages Coffee and tea, with or without caffeine. Carbonated beverages or energy drinks. Condiments Hot sauce. Barbecue sauce.  Sweets/Desserts Chocolate and cocoa. Donuts. Peppermint and spearmint. Fats and Oils High-fat foods, including [place] fries and potato chips. Other Vinegar. Strong spices, such as black pepper, white pepper, red pepper, cayenne, curry powder, cloves, ginger, and chili powder.

## 2020-12-12 LAB — CBC WITH DIFFERENTIAL/PLATELET
Absolute Monocytes: 753 cells/uL (ref 200–950)
Basophils Absolute: 21 cells/uL (ref 0–200)
Basophils Relative: 0.3 %
Eosinophils Absolute: 92 cells/uL (ref 15–500)
Eosinophils Relative: 1.3 %
HCT: 49.6 % (ref 38.5–50.0)
Hemoglobin: 17.7 g/dL — ABNORMAL HIGH (ref 13.2–17.1)
Lymphs Abs: 1498 cells/uL (ref 850–3900)
MCH: 35.7 pg — ABNORMAL HIGH (ref 27.0–33.0)
MCHC: 35.7 g/dL (ref 32.0–36.0)
MCV: 100 fL (ref 80.0–100.0)
MPV: 10.3 fL (ref 7.5–12.5)
Monocytes Relative: 10.6 %
Neutro Abs: 4736 cells/uL (ref 1500–7800)
Neutrophils Relative %: 66.7 %
Platelets: 240 10*3/uL (ref 140–400)
RBC: 4.96 10*6/uL (ref 4.20–5.80)
RDW: 11.9 % (ref 11.0–15.0)
Total Lymphocyte: 21.1 %
WBC: 7.1 10*3/uL (ref 3.8–10.8)

## 2020-12-12 LAB — COMPLETE METABOLIC PANEL WITH GFR
AG Ratio: 2 (calc) (ref 1.0–2.5)
ALT: 13 U/L (ref 9–46)
AST: 18 U/L (ref 10–35)
Albumin: 4.1 g/dL (ref 3.6–5.1)
Alkaline phosphatase (APISO): 35 U/L (ref 35–144)
BUN/Creatinine Ratio: 8 (calc) (ref 6–22)
BUN: 12 mg/dL (ref 7–25)
CO2: 27 mmol/L (ref 20–32)
Calcium: 9.9 mg/dL (ref 8.6–10.3)
Chloride: 108 mmol/L (ref 98–110)
Creat: 1.52 mg/dL — ABNORMAL HIGH (ref 0.70–1.28)
Globulin: 2.1 g/dL (calc) (ref 1.9–3.7)
Glucose, Bld: 97 mg/dL (ref 65–99)
Potassium: 4.9 mmol/L (ref 3.5–5.3)
Sodium: 141 mmol/L (ref 135–146)
Total Bilirubin: 0.8 mg/dL (ref 0.2–1.2)
Total Protein: 6.2 g/dL (ref 6.1–8.1)
eGFR: 47 mL/min/{1.73_m2} — ABNORMAL LOW (ref >=60)

## 2020-12-12 LAB — LIPID PANEL
Cholesterol: 134 mg/dL (ref <200)
HDL: 39 mg/dL — ABNORMAL LOW (ref >=40)
LDL Cholesterol (Calc): 74 mg/dL (calc)
Non-HDL Cholesterol (Calc): 95 mg/dL (calc) (ref <130)
Total CHOL/HDL Ratio: 3.4 (calc) (ref <5.0)
Triglycerides: 120 mg/dL (ref <150)

## 2020-12-12 LAB — MAGNESIUM: Magnesium: 1.9 mg/dL (ref 1.5–2.5)

## 2020-12-12 LAB — TSH: TSH: 3.23 mIU/L (ref 0.40–4.50)

## 2021-01-06 ENCOUNTER — Other Ambulatory Visit (HOSPITAL_COMMUNITY): Payer: Self-pay

## 2021-01-06 MED FILL — Propranolol HCl Cap ER 24HR 120 MG: ORAL | 90 days supply | Qty: 90 | Fill #2 | Status: AC

## 2021-02-02 ENCOUNTER — Other Ambulatory Visit: Payer: Self-pay | Admitting: Internal Medicine

## 2021-02-02 ENCOUNTER — Other Ambulatory Visit (HOSPITAL_COMMUNITY): Payer: Self-pay

## 2021-02-02 DIAGNOSIS — K219 Gastro-esophageal reflux disease without esophagitis: Secondary | ICD-10-CM

## 2021-02-02 MED ORDER — OMEPRAZOLE 40 MG PO CPDR
40.0000 mg | DELAYED_RELEASE_CAPSULE | Freq: Every day | ORAL | 3 refills | Status: DC
  Filled 2021-02-02: qty 90, 90d supply, fill #0
  Filled 2021-05-17: qty 90, 90d supply, fill #1
  Filled 2021-08-12: qty 90, 90d supply, fill #2
  Filled 2021-12-07: qty 90, 90d supply, fill #3

## 2021-02-08 ENCOUNTER — Other Ambulatory Visit: Payer: Self-pay | Admitting: Adult Health Nurse Practitioner

## 2021-02-08 ENCOUNTER — Other Ambulatory Visit: Payer: Self-pay

## 2021-02-08 DIAGNOSIS — E782 Mixed hyperlipidemia: Secondary | ICD-10-CM

## 2021-02-08 MED FILL — Rosuvastatin Calcium Tab 40 MG: ORAL | 90 days supply | Qty: 90 | Fill #0 | Status: AC

## 2021-02-09 ENCOUNTER — Other Ambulatory Visit (HOSPITAL_COMMUNITY): Payer: Self-pay

## 2021-02-11 ENCOUNTER — Ambulatory Visit: Payer: Medicare HMO | Admitting: Adult Health

## 2021-02-11 ENCOUNTER — Encounter: Payer: Self-pay | Admitting: Adult Health

## 2021-02-11 VITALS — BP 158/99 | HR 58 | Ht 72.0 in | Wt 195.4 lb

## 2021-02-11 DIAGNOSIS — G4733 Obstructive sleep apnea (adult) (pediatric): Secondary | ICD-10-CM

## 2021-02-11 DIAGNOSIS — Z9989 Dependence on other enabling machines and devices: Secondary | ICD-10-CM

## 2021-02-11 NOTE — Patient Instructions (Signed)
Continue using CPAP nightly and greater than 4 hours each night °If your symptoms worsen or you develop new symptoms please let us know.  ° °

## 2021-02-11 NOTE — Progress Notes (Signed)
PATIENT: Juan Torres DOB: Oct 10, 1945  REASON FOR VISIT: follow up HISTORY FROM: patient PRIMARY NEUROLOGIST: Dr. Brett Fairy  HISTORY OF PRESENT ILLNESS: Today 02/11/21:  Juan Torres is a 76 year old male with a history of obstructive sleep apnea on CPAP.  He returns today for follow-up.  He reports that the CPAP works well for him.  He denies any new issues.  He does feel his mask leaking at night.  Reports that he has a full beard and uses a fullface mask. he returns today for an evaluation.    HISTORY 02/12/20:   Juan Torres is a 76 year old male with a history of obstructive sleep apnea on CPAP.  His download indicates that he uses machine nightly for compliance of 100%.  He uses machine greater than 4 hours 23 days for compliance of 77%.  On average he uses his machine 5 hours and 14 minutes.  His residual AHI is 1.5 on 5 to 15 cm of water with EPR 2.  Leak in the 95th percentile is 31.6 L/min.  He reports that the mask irritates the skin on his nose.  He has been putting a cushion bandage over the bridge of his nose.  He has tried several different masks in the past but likes this on the best.  He returns today for follow-up.  REVIEW OF SYSTEMS: Out of a complete 14 system review of symptoms, the patient complains only of the following symptoms, and all other reviewed systems are negative.  ESS 2  ALLERGIES: Allergies  Allergen Reactions   Lipitor [Atorvastatin] Other (See Comments)    fatigued   Niacin And Related Other (See Comments)    Flushing   Penicillins Rash    HOME MEDICATIONS: Outpatient Medications Prior to Visit  Medication Sig Dispense Refill   B Complex Vitamins (VITAMIN B-COMPLEX PO) Take 1 tablet by mouth daily.     Cholecalciferol (VITAMIN D3) 125 MCG (5000 UT) TABS Take 15,000 Units by mouth every morning.     ezetimibe (ZETIA) 10 MG tablet Take 1 tablet by mouth daily for cholesterol. 90 tablet 3   fenofibrate micronized (LOFIBRA) 134 MG capsule Take 1  capsule Daily for Triglycerides (Blood Fats) 90 capsule 3   Magnesium 400 MG CAPS Take 400 mg by mouth every evening.      Omega-3 Fatty Acids (FISH OIL) 1000 MG CAPS Take 1,000 mg by mouth every morning.     omeprazole (PRILOSEC) 40 MG capsule Take 1 capsule (40 mg total) by mouth daily to prevent indigestion & heartburn 90 capsule 3   propranolol ER (INDERAL LA) 120 MG 24 hr capsule Take 1 capsule (120 mg total) by mouth daily for blood pressure and tremor. 90 capsule 3   rosuvastatin (CRESTOR) 40 MG tablet Take 1 tablet (40 mg total) by mouth daily for cholesterol 90 tablet 3   famotidine (PEPCID) 20 MG tablet Take 1 tablet (20 mg total) by mouth 2 (two) times daily; before breakfast and before dinner 180 tablet 0   No facility-administered medications prior to visit.    PAST MEDICAL HISTORY: Past Medical History:  Diagnosis Date   Adenomatous colon polyp    GERD (gastroesophageal reflux disease)    History of basal cell cancer 2012   Hyperlipidemia    Hypertension    Prediabetes    Prostate cancer ([place]) DX 12/19/11   bx=Adenocarcinoma,gleason=3+3=6,volume=42cc,PSA=4.63   Pulmonary nodules 12/10/2018   Numerous unchanged/benign appearing per CT lung 12/2018   Vitamin D deficiency  PAST SURGICAL HISTORY: Past Surgical History:  Procedure Laterality Date   COLONOSCOPY W/ POLYPECTOMY     Adenomatous   CYSTOSCOPY N/A 04/04/2012   Procedure: CYSTOSCOPY;  Surgeon: Molli Hazard, MD;  Location: [facility];  Service: Urology;  Laterality: N/A;   EYE SURGERY     bilateral cataracts    INGUINAL HERNIA REPAIR Bilateral 10/18/2018   Procedure: LAPAROSCOPIC BILATERAL INGUINAL HERNIA REPAIR;  Surgeon: Johnathan Hausen, MD;  Location: [facility];  Service: General;  Laterality: Bilateral;   PROSTATE BIOPSY  12/19/2011   Procedure: BIOPSY TRANSRECTAL ULTRASONIC PROSTATE (TUBP);  Surgeon: Molli Hazard, MD;  Location: [facility];  Service: Urology;   Laterality: N/A;     RADIOACTIVE SEED IMPLANT N/A 04/04/2012   Procedure: RADIOACTIVE SEED IMPLANT;  Surgeon: Molli Hazard, MD;  Location: [facility];  Service: Urology;  Laterality: N/A;  Seeds Implanted     46  Seeds Found in Bladder     None     FAMILY HISTORY: Family History  Problem Relation Age of Onset   Breast cancer Mother    Heart attack Father 4   Dementia Father    Prostate cancer Maternal Uncle        prostate   Ulcers Maternal Grandmother    Cancer Maternal Grandfather        colon   Prostate cancer Cousin        prostate cancer 1st maternal    Sleep apnea Neg Hx     SOCIAL HISTORY: Social History   Socioeconomic History   Marital status: Married    Spouse name: Not on file   Number of children: 2   Years of education: Not on file   Highest education level: Not on file  Occupational History   Not on file  Tobacco Use   Smoking status: Former    Packs/day: 1.00    Years: 30.00    Pack years: 30.00    Types: Cigarettes    Quit date: 12/11/2018    Years since quitting: 2.1   Smokeless tobacco: Never  Vaping Use   Vaping Use: Never used  Substance and Sexual Activity   Alcohol use: Not Currently    Alcohol/week: 8.0 standard drinks    Types: 8 Glasses of wine per week   Drug use: Yes    Frequency: 7.0 times per week    Types: Marijuana    Comment: / marijuana-last smoked 10/15/2018   Sexual activity: Not on file  Other Topics Concern   Not on file  Social History Narrative   Lives at home with spouse   Right handed   Caffeine: none   Social Determinants of Health   Financial Resource Strain: Not on file  Food Insecurity: Not on file  Transportation Needs: Not on file  Physical Activity: Not on file  Stress: Not on file  Social Connections: Not on file  Intimate Partner Violence: Not on file      PHYSICAL EXAM  Vitals:   02/11/21 0923  BP: (!) 158/99  Pulse: (!) 58  Weight: 195 lb 6.4 oz (88.6 kg)   Height: 6' (1.829 m)   Body mass index is 26.5 kg/m.  Generalized: Well developed, in no acute distress  Chest: Lungs clear to auscultation bilaterally  Neurological examination  Mentation: Alert oriented to time, place, history taking. Follows all commands speech and language fluent Cranial nerve II-XII: Extraocular movements were full, visual field were full on confrontational test  Head turning and shoulder shrug  were normal and symmetric. Motor: The motor testing reveals 5 over 5 strength of all 4 extremities. Good symmetric motor tone is noted throughout.  Sensory: Sensory testing is intact to soft touch on all 4 extremities. No evidence of extinction is noted.  Gait and station: Gait is normal.    DIAGNOSTIC DATA (LABS, IMAGING, TESTING) - I reviewed patient records, labs, notes, testing and imaging myself where available.  Lab Results  Component Value Date   WBC 7.1 12/11/2020   HGB 17.7 (H) 12/11/2020   HCT 49.6 12/11/2020   MCV 100.0 12/11/2020   PLT 240 12/11/2020      Component Value Date/Time   NA 141 12/11/2020 1127   K 4.9 12/11/2020 1127   CL 108 12/11/2020 1127   CO2 27 12/11/2020 1127   GLUCOSE 97 12/11/2020 1127   BUN 12 12/11/2020 1127   CREATININE 1.52 (H) 12/11/2020 1127   CALCIUM 9.9 12/11/2020 1127   PROT 6.2 12/11/2020 1127   ALBUMIN 4.2 08/02/2016 1014   AST 18 12/11/2020 1127   ALT 13 12/11/2020 1127   ALKPHOS 32 (L) 08/02/2016 1014   BILITOT 0.8 12/11/2020 1127   GFRNONAA 45 (L) 05/20/2020 1517   GFRAA 52 (L) 05/20/2020 1517   Lab Results  Component Value Date   CHOL 134 12/11/2020   HDL 39 (L) 12/11/2020   LDLCALC 74 12/11/2020   TRIG 120 12/11/2020   CHOLHDL 3.4 12/11/2020   Lab Results  Component Value Date   HGBA1C 5.4 05/20/2020   Lab Results  Component Value Date   VITAMINB12 780 06/12/2018   Lab Results  Component Value Date   TSH 3.23 12/11/2020      ASSESSMENT AND PLAN 76 y.o. year old male  has a past medical  history of Adenomatous colon polyp, GERD (gastroesophageal reflux disease), History of basal cell cancer (2012), Hyperlipidemia, Hypertension, Prediabetes, Prostate cancer ([place]) (DX 12/19/11), Pulmonary nodules (12/10/2018), and Vitamin D deficiency. here with:  OSA on CPAP  - CPAP compliance excellent - Good treatment of AHI  - Encourage patient to use CPAP nightly and > 4 hours each night - F/U in 1 year or sooner if needed    Ward Givens, MSN, NP-C 02/11/2021, 9:31 AM [facility] [address] [place], [place] [address] [phone]

## 2021-02-17 ENCOUNTER — Other Ambulatory Visit (HOSPITAL_COMMUNITY): Payer: Self-pay

## 2021-03-22 ENCOUNTER — Other Ambulatory Visit (HOSPITAL_COMMUNITY): Payer: Self-pay

## 2021-04-16 ENCOUNTER — Other Ambulatory Visit (HOSPITAL_COMMUNITY): Payer: Self-pay

## 2021-04-30 ENCOUNTER — Other Ambulatory Visit (HOSPITAL_COMMUNITY): Payer: Self-pay

## 2021-04-30 ENCOUNTER — Other Ambulatory Visit: Payer: Self-pay | Admitting: Internal Medicine

## 2021-04-30 DIAGNOSIS — G250 Essential tremor: Secondary | ICD-10-CM

## 2021-04-30 DIAGNOSIS — I10 Essential (primary) hypertension: Secondary | ICD-10-CM

## 2021-04-30 MED ORDER — PROPRANOLOL HCL ER 120 MG PO CP24
120.0000 mg | ORAL_CAPSULE | Freq: Every day | ORAL | 3 refills | Status: DC
  Filled 2021-04-30: qty 90, 90d supply, fill #0
  Filled 2021-07-22: qty 90, 90d supply, fill #1
  Filled 2021-10-25: qty 90, 90d supply, fill #2
  Filled 2022-01-24: qty 90, 90d supply, fill #3

## 2021-05-04 ENCOUNTER — Other Ambulatory Visit (HOSPITAL_COMMUNITY): Payer: Self-pay

## 2021-05-17 ENCOUNTER — Other Ambulatory Visit (HOSPITAL_COMMUNITY): Payer: Self-pay

## 2021-05-17 MED FILL — Rosuvastatin Calcium Tab 40 MG: ORAL | 90 days supply | Qty: 90 | Fill #1 | Status: AC

## 2021-05-24 ENCOUNTER — Encounter: Payer: Self-pay | Admitting: Internal Medicine

## 2021-05-24 NOTE — Progress Notes (Signed)
? ?Annual  Screening/Preventative Visit  ?& Comprehensive Evaluation & Examination ? ?Future Appointments  ?Date Time Provider Department  ?05/25/2021 10:00 AM Unk Pinto, MD [facility]  ?12/14/2021 11:00 AM Liane Comber, NP [facility]  ?02/14/2022 10:00 AM Ward Givens, NP [facility]  ?05/31/2022 10:00 AM Unk Pinto, MD [facility]  ? ?    ?     This very nice Juan Torres presents for a Screening /Preventative Visit & comprehensive evaluation and management of multiple medical co-morbidities.  Patient has been followed for HTN, HLD, Prediabetes and Vitamin D Deficiency.  Patient also has GERD controlled with his OTC Nexium, hx/o  Prostate treated in 2014 /Brachytherapy and    familial Tremor treated with Propranolol.  Chest CT scan in 2020 showed thoracic  Aortic Atherosclerosis.  ? ?                                                  Today's BP is at goal - 126/64.  HTN predates since  1998.   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   Patient's BP has been controlled at home.  Today's BP is at goal -  120/80. Patient denies any cardiac symptoms as chest pain, palpitations, shortness of breath, dizziness or ankle swelling. ? ? ?    Patient's hyperlipidemia is controlled with diet and Rosuvastatin, fenofibrate & ezetimibe . Patient denies myalgias or other medication SE's. Last lipids were at goal : ? ?Lab Results  ?Component Value Date  ? CHOL 134 12/11/2020  ? HDL 39 (L) 12/11/2020  ? [place] 74 12/11/2020  ? TRIG 120 12/11/2020  ? CHOLHDL 3.4 12/11/2020  ? ? ? ?    Patient has hx/o prediabetes  (A1c 5.7% /2014)   and patient denies reactive hypoglycemic symptoms, visual blurring, diabetic polys or paresthesias. Last A1c was normal & at goal : ?  ?Lab Results  ?Component Value Date  ? HGBA1C 5.4 05/20/2020  ?  ? ? ?    Finally, patient has history of Vitamin D Deficiency of    and last vitamin D was still slightly low  ( goal 70-10 ) : ?  ?Lab Results  ?Component  Value Date  ? VD25OH 52 05/20/2020  ? ? ? ?Current Outpatient Medications on File Prior to Visit  ?Medication Sig  ? B Complex Vitamins  Take 1 tablet by mouth daily.  ? VITAMIN D  5,000 u Take 15,000 Units by mouth every morning.  ? ezetimibe 10 MG tablet Take 1 tabletdaily for cholesterol.  ? famotidine  20 MG tablet Take 1 tablet times daily  ? fenofibrate 134 MG capsule Take 1 capsule Daily   ? Magnesium 400 MG CAPS Take every evening  ? Omega-3 FISH OIL 1000 MG  Take every morning  ? omeprazole 40 MG capsule Take 1 capsule  daily   ? propranolol ER 120 MG  Take 1 capsule daily   ? rosuvastatin  40 MG tablet Take 1 tablet daily   ? ? ? ? ?Allergies  ?Allergen Reactions  ? Lipitor [Atorvastatin] Other (See Comments)  ?  fatigued  ? Niacin And Related Other (See Comments)  ?  Flushing  ? Penicillins Rash  ? ? ? ?Past Medical History:  ?Diagnosis Date  ? Adenomatous colon polyp   ?  GERD (gastroesophageal reflux disease)   ? History of basal cell cancer 2012  ? Hyperlipidemia   ? Hypertension   ? Prediabetes   ? Prostate cancer ([place]) DX 12/19/11  ? bx=Adenocarcinoma,gleason=3+3=6,volume=42cc,PSA=4.63  ? Pulmonary nodules 12/10/2018  ? Numerous unchanged/benign appearing per CT lung 12/2018  ? Vitamin D deficiency   ? ? ? ?Health Maintenance  ?Topic Date Due  ? Zoster Vaccines- Shingrix (1 of 2) Never done  ? COVID-19 Vaccine (3 - Pfizer risk series) 05/28/2019  ? INFLUENZA VACCINE  09/07/2021  ? TETANUS/TDAP  10/19/2021  ? Pneumonia Vaccine 36+ Years old  Completed  ? Hepatitis C Screening  Completed  ? HPV VACCINES  Aged Out  ? ? ? ?Immunization History  ?Administered Date(s) Administered  ?  DT                               10/20/2011  ? [place] SARS-COV-2 Vacc  04/04/2019, 04/30/2019  ? Pneumococcal -13 11/14/2017  ? Pneumococcal -23 04/28/2016  ? Zoster, Live 10/20/2011  ? ? ?Last Colon -  10.25.2013 - Dr Deatra Ina - recc 10 year f/u due Nov 2023. ?  ? ? ?Past Surgical History:  ?Procedure Laterality Date  ?  COLONOSCOPY W/ POLYPECTOMY    ? Adenomatous  ? CYSTOSCOPY N/A 04/04/2012  ? Procedure: CYSTOSCOPY;  Surgeon: Molli Hazard, MD;  Location[facility];  Service: Urology;  Laterality: N/A;  ? EYE SURGERY    ? bilateral cataracts   ? INGUINAL HERNIA REPAIR Bilateral 10/18/2018  ? Procedure: LAPAROSCOPIC BILATERAL INGUINAL HERNIA REPAIR;  Surgeon: Johnathan Hausen, MD;  Location: [facility];  Service: General;  Laterality: Bilateral;  ? PROSTATE BIOPSY  12/19/2011  ? Procedure: BIOPSY TRANSRECTAL ULTRASONIC PROSTATE (TUBP);  Surgeon: Molli Hazard, MD;  Location: [facility];  Service: Urology;  Laterality: N/A;    ? RADIOACTIVE SEED IMPLANT N/A 04/04/2012  ? Procedure: RADIOACTIVE SEED IMPLANT;  Surgeon: Molli Hazard, MD;  Location: [facility];  Service: Urology;  Laterality: N/A;  Seeds Implanted     72  ?Seeds Found in Bladder     None   ? ? ? ?Family History  ?Problem Relation Age of Onset  ? Breast cancer Mother   ? Heart attack Father 42  ? Dementia Father   ? Prostate cancer Maternal Uncle   ?     prostate  ? Ulcers Maternal Grandmother   ? Cancer Maternal Grandfather   ?     colon  ? Prostate cancer Cousin   ?     prostate cancer 1st maternal   ? Sleep apnea Neg Hx   ? ? ? ?Social History  ? ?Tobacco Use  ? Smoking status: Former  ?  Packs/day: 1.00  ?  Years: 30.00  ?  Pack years: 30.00  ?  Types: Cigarettes  ?  Quit date: 12/11/2018  ?  Years since quitting: 2.4  ? Smokeless tobacco: Never  ?Vaping Use  ? Vaping Use: Never used  ?Substance Use Topics  ? Alcohol use: Not Currently  ?  Alcohol/week: 8.0 standard drinks  ?  Types: 8 Glasses of wine per week  ? Drug use: Yes  ?  Frequency: 7.0 times per week  ?  Types: Marijuana  ?  Comment: / marijuana-last smoked 10/15/2018  ? ? ? ? ROS ?Constitutional: Denies fever, chills, weight loss/gain, headaches, insomnia,  night sweats or  change in appetite. Does c/o fatigue. ?Eyes: Denies redness, blurred  vision, diplopia, discharge, itchy or watery eyes.  ?ENT: Denies discharge, congestion, post nasal drip, epistaxis, sore throat, earache, hearing loss, dental pain, Tinnitus, Vertigo, Sinus pain or snoring.  ?Cardio: Denies chest pain, palpitations, irregular heartbeat, syncope, dyspnea, diaphoresis, orthopnea, PND, claudication or edema ?Respiratory: denies cough, dyspnea, DOE, pleurisy, hoarseness, laryngitis or wheezing.  ?Gastrointestinal: Denies dysphagia, heartburn, reflux, water brash, pain, cramps, nausea, vomiting, bloating, diarrhea, constipation, hematemesis, melena, hematochezia, jaundice or hemorrhoids ?Genitourinary: Denies dysuria, frequency, urgency, nocturia, hesitancy, discharge, hematuria or flank pain ?Musculoskeletal: Denies arthralgia, myalgia, stiffness, Jt. Swelling, pain, limp or strain/sprain. Denies Falls. ?Skin: Denies puritis, rash, hives, warts, acne, eczema or change in skin lesion ?Neuro: No weakness, tremor, incoordination, spasms, paresthesia or pain ?Psychiatric: Denies confusion, memory loss or sensory loss. Denies Depression. ?Endocrine: Denies change in weight, skin, hair change, nocturia, and paresthesia, diabetic polys, visual blurring or hyper / hypo glycemic episodes.  ?Heme/Lymph: No excessive bleeding, bruising or enlarged lymph nodes. ? ? ?Physical Exam ? ?BP 120/80   Pulse 69   Temp 97.9 ?F (36.6 ?C)   Resp 16   Ht 6' (1.829 m)   Wt 190 lb 12.8 oz (86.5 kg)   SpO2 96%   BMI 25.88 kg/m?  ? ?General Appearance: Well nourished and well groomed and in no apparent distress. ? ?Eyes: PERRLA, EOMs, conjunctiva no swelling or erythema, normal fundi and vessels. ?Sinuses: No frontal/maxillary tenderness ?ENT/Mouth: EACs patent / TMs  nl. Nares clear without erythema, swelling, mucoid exudates. Oral hygiene is good. No erythema, swelling, or exudate. Tongue normal, non-obstructing. Tonsils not swollen or erythematous. Hearing normal.  ?Neck: Supple, thyroid not palpable. No  bruits, nodes or JVD. ?Respiratory: Respiratory effort normal.  BS equal and clear bilateral without rales, rhonci, wheezing or stridor. ?Cardio: Heart sounds are normal with regular rate and rhythm and no murmurs,

## 2021-05-24 NOTE — Patient Instructions (Signed)
Due to recent changes in healthcare laws, you may see the results of your imaging and laboratory studies on MyChart before your provider has had a chance to review them.  We understand that in some cases there may be results that are confusing or concerning to you. Not all laboratory results come back in the same time frame and the provider may be waiting for multiple results in order to interpret others.  Please give us 48 hours in order for your provider to thoroughly review all the results before contacting the office for clarification of your results.  ? ?+++++++++++++++++++++++++++++++ ? Vit D  & ?Vit C 1,000 mg   ?are recommended to help protect  ?against the Covid-19 and other Corona viruses.  ? ? Also it's recommended  ?to take  ?Zinc 50 mg  ?to help  ?protect against the Covid-19   ?and best place to get ? is also on [employer]  ?and don't pay more than 6-8 cents /pill !  ?================================ ?Coronavirus (COVID-19) Are you at risk? ? ?Are you at risk for the Coronavirus (COVID-19)? ? ?To be considered HIGH RISK for Coronavirus (COVID-19), you have to meet the following criteria: ? ?Traveled to [place], [place], [place], [place] or [place]; or in the [place] to [place], [place], [place]  ?or [place]; and have fever, cough, and shortness of breath within the last 2 weeks of travel OR ?Been in close contact with a person diagnosed with COVID-19 within the last 2 weeks and have  ?fever, cough,and shortness of breath ? ?IF YOU DO NOT MEET THESE CRITERIA, YOU ARE CONSIDERED LOW RISK FOR COVID-19. ? ?What to do if you are HIGH RISK for COVID-19? ? ?If you are having a medical emergency, call 911. ?Seek medical care right away. Before you go to a doctor?s office, urgent care or emergency department, ? call ahead and tell them about your recent travel, contact with someone diagnosed with COVID-19  ? and your symptoms.  ?You should receive instructions from your physician?s office regarding  next steps of care.  ?When you arrive at healthcare provider, tell the healthcare staff immediately you have returned from  ?visiting [place], [place], [place], [place] or [place]; or traveled in the [place] to [place], [place],  ?[place] or [place] in the last two weeks or you have been in close contact with a person diagnosed with  ?COVID-19 in the last 2 weeks.   ?Tell the health care staff about your symptoms: fever, cough and shortness of breath. ?After you have been seen by a medical provider, you will be either: ?Tested for (COVID-19) and discharged home on quarantine except to seek medical care if  ?symptoms worsen, and asked to  ?Stay home and avoid contact with others until you get your results (4-5 days)  ?Avoid travel on public transportation if possible (such as bus, train, or airplane) or ?Sent to the Emergency Department by EMS for evaluation, COVID-19 testing  and  ?possible admission depending on your condition and test results. ? ?What to do if you are LOW RISK for COVID-19? ? ?Reduce your risk of any infection by using the same precautions used for avoiding the common cold or flu:  ?Wash your hands often with soap and warm water for at least 20 seconds.  If soap and water are not readily available,  ?use an alcohol-based hand sanitizer with at least 60% alcohol.  ?If coughing or sneezing, cover your mouth and nose by coughing   or sneezing into the elbow areas of your shirt or coat, ? into a tissue or into your sleeve (not your hands). ?Avoid shaking hands with others and consider head nods or verbal greetings only. ?Avoid touching your eyes, nose, or mouth with unwashed hands.  ?Avoid close contact with people who are sick. ?Avoid places or events with large numbers of people in one location, like concerts or sporting events. ?Carefully consider travel plans you have or are making. ?If you are planning any travel outside or inside the [place], visit the CDC?s Travelers? Health webpage for  the latest health notices. ?If you have some symptoms but not all symptoms, continue to monitor at home and seek medical attention  ?if your symptoms worsen. ?If you are having a medical emergency, call 911. ?>>>>>>>>>>>>>>>>>>>>>>>>>>>>>>>>>>>>>>>>>>>>>>>>>>> ?We Do NOT Approve of [employer] ?> > > > > > > > > > > > > > > > > > > > > > > > > > > > > > > > > > >  > >  ? ? ?Preventive Care for Adults ? ?A healthy lifestyle and preventive care can promote health and wellness. Preventive health guidelines for men include the following key practices: ?A routine yearly physical is a good way to check with your health care provider about your health and preventative screening. It is a chance to share any concerns and updates on your health and to receive a thorough exam. ?Visit your dentist for a routine exam and preventative care every 6 months. Brush your teeth twice a day and floss once a day. Good oral hygiene prevents tooth decay and gum disease. ?The frequency of eye exams is based on your age, health, family medical history, use of contact lenses, and other factors. Follow your health care provider's recommendations for frequency of eye exams. ?Eat a healthy diet. Foods such as vegetables, fruits, whole grains, low-fat dairy products, and lean protein foods contain the nutrients you need without too many calories. Decrease your intake of foods high in solid fats, added sugars, and salt. Eat the right amount of calories for you. Get information about a proper diet from your health care provider, if necessary. ?Regular physical exercise is one of the most important things you can do for your health. Most adults should get at least 150 minutes of moderate-intensity exercise (any activity that increases your heart rate and causes you to sweat) each week. In addition, most adults need muscle-strengthening exercises on 2 or more days a week. ?Maintain a healthy weight. The body mass index (BMI) is a screening  tool to identify possible weight problems. It provides an estimate of body fat based on height and weight. Your health care provider can find your BMI and can help you achieve or maintain a healthy weight. For adults 20 years and older: ?A BMI below 18.5 is considered underweight. ?A BMI of 18.5 to 24.9 is normal. ?A BMI of 25 to 29.9 is considered overweight. ?A BMI of 30 and above is considered obese. ?Maintain normal blood lipids and cholesterol levels by exercising and minimizing your intake of saturated fat. Eat a balanced diet with plenty of fruit and vegetables. Blood tests for lipids and cholesterol should begin at age 20 and be repeated every 5 years. If your lipid or cholesterol levels are high, you are over 50, or you are at high risk for heart disease, you may need your cholesterol levels checked more frequently. Ongoing high lipid and cholesterol levels should be   treated with medicines if diet and exercise are not working. ?If you smoke, find out from your health care provider how to quit. If you do not use tobacco, do not start. ?Lung cancer screening is recommended for adults aged 55-80 years who are at high risk for developing lung cancer because of a history of smoking. A yearly low-dose CT scan of the lungs is recommended for people who have at least a 30-pack-year history of smoking and are a current smoker or have quit within the past 15 years. A pack year of smoking is smoking an average of 1 pack of cigarettes a day for 1 year (for example: 1 pack a day for 30 years or 2 packs a day for 15 years). Yearly screening should continue until the smoker has stopped smoking for at least 15 years. Yearly screening should be stopped for people who develop a health problem that would prevent them from having lung cancer treatment. ?If you choose to drink alcohol, do not have more than 2 drinks per day. One drink is considered to be 12 ounces (355 mL) of beer, 5 ounces (148 mL) of wine, or 1.5 ounces (44  mL) of liquor. ?Avoid use of street drugs. Do not share needles with anyone. Ask for help if you need support or instructions about stopping the use of drugs. ?High blood pressure causes heart disease and

## 2021-05-25 ENCOUNTER — Encounter: Payer: Self-pay | Admitting: Internal Medicine

## 2021-05-25 ENCOUNTER — Ambulatory Visit (INDEPENDENT_AMBULATORY_CARE_PROVIDER_SITE_OTHER): Payer: Medicare HMO | Admitting: Internal Medicine

## 2021-05-25 VITALS — BP 120/80 | HR 69 | Temp 97.9°F | Resp 16 | Ht 72.0 in | Wt 190.8 lb

## 2021-05-25 DIAGNOSIS — Z0001 Encounter for general adult medical examination with abnormal findings: Secondary | ICD-10-CM

## 2021-05-25 DIAGNOSIS — N1832 Chronic kidney disease, stage 3b: Secondary | ICD-10-CM

## 2021-05-25 DIAGNOSIS — Z8546 Personal history of malignant neoplasm of prostate: Secondary | ICD-10-CM

## 2021-05-25 DIAGNOSIS — Z87891 Personal history of nicotine dependence: Secondary | ICD-10-CM | POA: Diagnosis not present

## 2021-05-25 DIAGNOSIS — Z0000 Encounter for general adult medical examination without abnormal findings: Secondary | ICD-10-CM

## 2021-05-25 DIAGNOSIS — E559 Vitamin D deficiency, unspecified: Secondary | ICD-10-CM

## 2021-05-25 DIAGNOSIS — Z79899 Other long term (current) drug therapy: Secondary | ICD-10-CM

## 2021-05-25 DIAGNOSIS — I700 Atherosclerosis of aorta: Secondary | ICD-10-CM | POA: Diagnosis not present

## 2021-05-25 DIAGNOSIS — Z125 Encounter for screening for malignant neoplasm of prostate: Secondary | ICD-10-CM

## 2021-05-25 DIAGNOSIS — N138 Other obstructive and reflux uropathy: Secondary | ICD-10-CM

## 2021-05-25 DIAGNOSIS — G4733 Obstructive sleep apnea (adult) (pediatric): Secondary | ICD-10-CM

## 2021-05-25 DIAGNOSIS — E782 Mixed hyperlipidemia: Secondary | ICD-10-CM

## 2021-05-25 DIAGNOSIS — Z8249 Family history of ischemic heart disease and other diseases of the circulatory system: Secondary | ICD-10-CM | POA: Diagnosis not present

## 2021-05-25 DIAGNOSIS — Z136 Encounter for screening for cardiovascular disorders: Secondary | ICD-10-CM | POA: Diagnosis not present

## 2021-05-25 DIAGNOSIS — Z1211 Encounter for screening for malignant neoplasm of colon: Secondary | ICD-10-CM

## 2021-05-25 DIAGNOSIS — N401 Enlarged prostate with lower urinary tract symptoms: Secondary | ICD-10-CM

## 2021-05-25 DIAGNOSIS — R7309 Other abnormal glucose: Secondary | ICD-10-CM

## 2021-05-25 DIAGNOSIS — I10 Essential (primary) hypertension: Secondary | ICD-10-CM | POA: Diagnosis not present

## 2021-05-26 LAB — CBC WITH DIFFERENTIAL/PLATELET
Absolute Monocytes: 570 cells/uL (ref 200–950)
Basophils Absolute: 38 cells/uL (ref 0–200)
Basophils Relative: 0.6 %
Eosinophils Absolute: 58 cells/uL (ref 15–500)
Eosinophils Relative: 0.9 %
HCT: 50.5 % — ABNORMAL HIGH (ref 38.5–50.0)
Hemoglobin: 17.9 g/dL — ABNORMAL HIGH (ref 13.2–17.1)
Lymphs Abs: 1357 cells/uL (ref 850–3900)
MCH: 36.5 pg — ABNORMAL HIGH (ref 27.0–33.0)
MCHC: 35.4 g/dL (ref 32.0–36.0)
MCV: 103.1 fL — ABNORMAL HIGH (ref 80.0–100.0)
MPV: 10.1 fL (ref 7.5–12.5)
Monocytes Relative: 8.9 %
Neutro Abs: 4378 cells/uL (ref 1500–7800)
Neutrophils Relative %: 68.4 %
Platelets: 178 10*3/uL (ref 140–400)
RBC: 4.9 10*6/uL (ref 4.20–5.80)
RDW: 12.6 % (ref 11.0–15.0)
Total Lymphocyte: 21.2 %
WBC: 6.4 10*3/uL (ref 3.8–10.8)

## 2021-05-26 LAB — LIPID PANEL
Cholesterol: 159 mg/dL (ref <200)
HDL: 53 mg/dL (ref >=40)
LDL Cholesterol (Calc): 74 mg/dL (calc)
Non-HDL Cholesterol (Calc): 106 mg/dL (calc) (ref <130)
Total CHOL/HDL Ratio: 3 (calc) (ref <5.0)
Triglycerides: 229 mg/dL — ABNORMAL HIGH (ref <150)

## 2021-05-26 LAB — COMPLETE METABOLIC PANEL WITH GFR
AG Ratio: 1.9 (calc) (ref 1.0–2.5)
ALT: 27 U/L (ref 9–46)
AST: 37 U/L — ABNORMAL HIGH (ref 10–35)
Albumin: 4.3 g/dL (ref 3.6–5.1)
Alkaline phosphatase (APISO): 30 U/L — ABNORMAL LOW (ref 35–144)
BUN/Creatinine Ratio: 11 (calc) (ref 6–22)
BUN: 21 mg/dL (ref 7–25)
CO2: 26 mmol/L (ref 20–32)
Calcium: 9.9 mg/dL (ref 8.6–10.3)
Chloride: 108 mmol/L (ref 98–110)
Creat: 1.87 mg/dL — ABNORMAL HIGH (ref 0.70–1.28)
Globulin: 2.3 g/dL (calc) (ref 1.9–3.7)
Glucose, Bld: 94 mg/dL (ref 65–99)
Potassium: 4.8 mmol/L (ref 3.5–5.3)
Sodium: 143 mmol/L (ref 135–146)
Total Bilirubin: 0.9 mg/dL (ref 0.2–1.2)
Total Protein: 6.6 g/dL (ref 6.1–8.1)
eGFR: 37 mL/min/{1.73_m2} — ABNORMAL LOW (ref >=60)

## 2021-05-26 LAB — URINALYSIS, ROUTINE W REFLEX MICROSCOPIC
Bilirubin Urine: NEGATIVE
Glucose, UA: NEGATIVE
Hgb urine dipstick: NEGATIVE
Ketones, ur: NEGATIVE
Leukocytes,Ua: NEGATIVE
Nitrite: NEGATIVE
Protein, ur: NEGATIVE
Specific Gravity, Urine: 1.012 (ref 1.001–1.035)
pH: 6.5 (ref 5.0–8.0)

## 2021-05-26 LAB — INSULIN, RANDOM: Insulin: 5 u[IU]/mL

## 2021-05-26 LAB — TSH: TSH: 3.03 mIU/L (ref 0.40–4.50)

## 2021-05-26 LAB — MICROALBUMIN / CREATININE URINE RATIO
Creatinine, Urine: 119 mg/dL (ref 20–320)
Microalb Creat Ratio: 2 mcg/mg creat (ref <30)
Microalb, Ur: 0.2 mg/dL

## 2021-05-26 LAB — MAGNESIUM: Magnesium: 2 mg/dL (ref 1.5–2.5)

## 2021-05-26 LAB — VITAMIN D 25 HYDROXY (VIT D DEFICIENCY, FRACTURES): Vit D, 25-Hydroxy: 97 ng/mL (ref 30–100)

## 2021-05-26 LAB — PTH, INTACT AND CALCIUM
Calcium: 9.9 mg/dL (ref 8.6–10.3)
PTH: 19 pg/mL (ref 16–77)

## 2021-05-26 LAB — PSA: PSA: <0.04 ng/mL (ref <=4.00)

## 2021-05-26 LAB — HEMOGLOBIN A1C
Hgb A1c MFr Bld: 5.4 % of total Hgb (ref <5.7)
Mean Plasma Glucose: 108 mg/dL
eAG (mmol/L): 6 mmol/L

## 2021-05-27 NOTE — Progress Notes (Signed)
<><><><><><><><><><><><><><><><><><><><><><><><><><><><><><><><><> ?<><><><><><><><><><><><><><><><><><><><><><><><><><><><><><><><><> ? ?-    Total Chol = 159  & LDL Chol = 74   - Both  Excellent  ? ?- Very low risk for Heart Attack  / Stroke ?<><><><><><><><><><><><><><><><><><><><><><><><><><><><><><><><><> ?<><><><><><><><><><><><><><><><><><><><><><><><><><><><><><><><><> ? ?-  PSA is not detectable   -   Great ! ?<><><><><><><><><><><><><><><><><><><><><><><><><><><><><><><><><> ?<><><><><><><><><><><><><><><><><><><><><><><><><><><><><><><><><> ? ?-  PTH is a hormone that regulates calcium balance & is Normal  ?<><><><><><><><><><><><><><><><><><><><><><><><><><><><><><><><><> ?<><><><><><><><><><><><><><><><><><><><><><><><><><><><><><><><><> ? ?-  A1c - Normal - No Diabetes - Great  ! ?<><><><><><><><><><><><><><><><><><><><><><><><><><><><><><><><><> ?<><><><><><><><><><><><><><><><><><><><><><><><><><><><><><><><><> ? ?- Vitamin D = 97  - Excellent !  - Please keep dose same  ?<><><><><><><><><><><><><><><><><><><><><><><><><><><><><><><><><> ?<><><><><><><><><><><><><><><><><><><><><><><><><><><><><><><><><> ? ?- All Else - CBC - Kidneys - Electrolytes - Liver - Magnesium & Thyroid   ? ?- all  Normal / OK ?<><><><><><><><><><><><><><><><><><><><><><><><><><><><><><><><><> ?<><><><><><><><><><><><><><><><><><><><><><><><><><><><><><><><><> ? ? ? ? ? ? ? ? ? ? ? ? ? ? ? ? ? ? ? ? ? ? ? ? ? ? ? ? ? ? ? ? ?

## 2021-06-28 ENCOUNTER — Other Ambulatory Visit: Payer: Self-pay | Admitting: Internal Medicine

## 2021-06-28 ENCOUNTER — Other Ambulatory Visit (HOSPITAL_COMMUNITY): Payer: Self-pay

## 2021-06-28 DIAGNOSIS — E782 Mixed hyperlipidemia: Secondary | ICD-10-CM

## 2021-06-28 MED ORDER — FENOFIBRATE MICRONIZED 134 MG PO CAPS
134.0000 mg | ORAL_CAPSULE | Freq: Every day | ORAL | 3 refills | Status: DC
  Filled 2021-06-28: qty 90, 90d supply, fill #0
  Filled 2021-09-17: qty 90, 90d supply, fill #1
  Filled 2021-12-24: qty 90, 90d supply, fill #2
  Filled 2022-03-25 – 2022-03-28 (×2): qty 90, 90d supply, fill #3

## 2021-07-15 ENCOUNTER — Other Ambulatory Visit (HOSPITAL_COMMUNITY): Payer: Self-pay

## 2021-07-15 ENCOUNTER — Other Ambulatory Visit: Payer: Self-pay | Admitting: Internal Medicine

## 2021-07-15 MED ORDER — EZETIMIBE 10 MG PO TABS
10.0000 mg | ORAL_TABLET | Freq: Every day | ORAL | 3 refills | Status: DC
Start: 2021-07-15 — End: 2022-08-08
  Filled 2021-07-15: qty 90, 90d supply, fill #0
  Filled 2021-10-13: qty 90, 90d supply, fill #1
  Filled 2022-01-25: qty 90, 90d supply, fill #2
  Filled 2022-04-27: qty 90, 90d supply, fill #3

## 2021-07-22 ENCOUNTER — Other Ambulatory Visit (HOSPITAL_COMMUNITY): Payer: Self-pay

## 2021-08-12 ENCOUNTER — Other Ambulatory Visit (HOSPITAL_COMMUNITY): Payer: Self-pay

## 2021-08-12 MED FILL — Rosuvastatin Calcium Tab 40 MG: ORAL | 90 days supply | Qty: 90 | Fill #2 | Status: AC

## 2021-08-24 ENCOUNTER — Ambulatory Visit: Payer: Medicare HMO | Admitting: Nurse Practitioner

## 2021-09-17 ENCOUNTER — Other Ambulatory Visit (HOSPITAL_COMMUNITY): Payer: Self-pay

## 2021-10-13 ENCOUNTER — Other Ambulatory Visit (HOSPITAL_COMMUNITY): Payer: Self-pay

## 2021-10-14 ENCOUNTER — Other Ambulatory Visit (HOSPITAL_COMMUNITY): Payer: Self-pay

## 2021-10-25 ENCOUNTER — Other Ambulatory Visit (HOSPITAL_COMMUNITY): Payer: Self-pay

## 2021-11-09 ENCOUNTER — Other Ambulatory Visit (HOSPITAL_COMMUNITY): Payer: Self-pay

## 2021-11-09 MED FILL — Rosuvastatin Calcium Tab 40 MG: ORAL | 90 days supply | Qty: 90 | Fill #3 | Status: AC

## 2021-12-07 ENCOUNTER — Other Ambulatory Visit (HOSPITAL_COMMUNITY): Payer: Self-pay

## 2021-12-14 ENCOUNTER — Ambulatory Visit: Payer: Medicare HMO | Admitting: Adult Health

## 2021-12-24 ENCOUNTER — Other Ambulatory Visit: Payer: Self-pay

## 2021-12-24 ENCOUNTER — Other Ambulatory Visit (HOSPITAL_COMMUNITY): Payer: Self-pay

## 2022-01-24 ENCOUNTER — Other Ambulatory Visit: Payer: Self-pay

## 2022-01-24 ENCOUNTER — Other Ambulatory Visit (HOSPITAL_COMMUNITY): Payer: Self-pay

## 2022-01-25 ENCOUNTER — Other Ambulatory Visit: Payer: Self-pay | Admitting: Nurse Practitioner

## 2022-01-25 ENCOUNTER — Ambulatory Visit: Payer: Medicare HMO | Admitting: Nurse Practitioner

## 2022-01-25 ENCOUNTER — Encounter: Payer: Self-pay | Admitting: Nurse Practitioner

## 2022-01-25 VITALS — BP 160/90 | HR 68 | Temp 97.3°F | Ht 72.0 in | Wt 171.0 lb

## 2022-01-25 DIAGNOSIS — F1010 Alcohol abuse, uncomplicated: Secondary | ICD-10-CM

## 2022-01-25 DIAGNOSIS — Z85828 Personal history of other malignant neoplasm of skin: Secondary | ICD-10-CM

## 2022-01-25 DIAGNOSIS — E559 Vitamin D deficiency, unspecified: Secondary | ICD-10-CM

## 2022-01-25 DIAGNOSIS — N1832 Chronic kidney disease, stage 3b: Secondary | ICD-10-CM

## 2022-01-25 DIAGNOSIS — Z1211 Encounter for screening for malignant neoplasm of colon: Secondary | ICD-10-CM

## 2022-01-25 DIAGNOSIS — Z8546 Personal history of malignant neoplasm of prostate: Secondary | ICD-10-CM

## 2022-01-25 DIAGNOSIS — Z87891 Personal history of nicotine dependence: Secondary | ICD-10-CM

## 2022-01-25 DIAGNOSIS — I10 Essential (primary) hypertension: Secondary | ICD-10-CM | POA: Diagnosis not present

## 2022-01-25 DIAGNOSIS — K219 Gastro-esophageal reflux disease without esophagitis: Secondary | ICD-10-CM

## 2022-01-25 DIAGNOSIS — R7309 Other abnormal glucose: Secondary | ICD-10-CM | POA: Diagnosis not present

## 2022-01-25 DIAGNOSIS — I700 Atherosclerosis of aorta: Secondary | ICD-10-CM

## 2022-01-25 DIAGNOSIS — E782 Mixed hyperlipidemia: Secondary | ICD-10-CM

## 2022-01-25 DIAGNOSIS — G4733 Obstructive sleep apnea (adult) (pediatric): Secondary | ICD-10-CM

## 2022-01-25 DIAGNOSIS — J432 Centrilobular emphysema: Secondary | ICD-10-CM

## 2022-01-25 DIAGNOSIS — G250 Essential tremor: Secondary | ICD-10-CM

## 2022-01-25 DIAGNOSIS — Z0000 Encounter for general adult medical examination without abnormal findings: Secondary | ICD-10-CM

## 2022-01-25 NOTE — Patient Instructions (Signed)

## 2022-01-25 NOTE — Progress Notes (Signed)
MEDICARE ANNUAL WELLNESS VISIT AND FOLLOW UP Assessment:   [name] was seen today for medicare wellness and follow-up.  Diagnoses and all orders for this visit:  Annual Medicare Wellness Visit Due annually  Health maintenance reviewed  Atherosclerosis of aorta ([place]) Per CT 2019 Control blood pressure, cholesterol, glucose, increase exercise.   Centrilobular emphysema (HCC) Mild, by imaging; patient denies sx; patient did quit smoking  Essential hypertension Elevated in clinic - asymptomatic Tearful today Discussed DASH (Dietary Approaches to Stop Hypertension) DASH diet is lower in sodium than a typical American diet. Cut back on foods that are high in saturated fat, cholesterol, and trans fats. Eat more whole-grain foods, fish, poultry, and nuts Remain active and exercise as tolerated daily.  Monitor BP at home-Call if greater than 130/80.  Check CMP/CBC   Mixed hyperlipidemia Discussed lifestyle modifications. Recommended diet heavy in fruits and veggies, omega 3's. Decrease consumption of animal meats, cheeses, and dairy products. Remain active and exercise as tolerated. Continue to monitor. Check lipids/TSH  Other abnormal glucose Recent A1c at goal. Education: Reviewed 'ABCs' of diabetes management  Discussed goals to be met and/or maintained include A1C (<7) Blood pressure (<130/80) Cholesterol (LDL <70) Continue Eye Exam yearly  Continue Dental Exam Q6 mo Discussed dietary recommendations Discussed Physical Activity recommendations Foot exam UTD Check A1C   Vitamin D deficiency/ osteoporosis prophylaxis Continue supplement Monitor levels  Gastroesophageal reflux disease, esophagitis presence not specified No suspected reflux complications ([place]/stricture). Lifestyle modification:  wt loss, avoid meals 2-3h before bedtime. Consider eliminating food triggers:  chocolate, caffeine, EtOH, acid/spicy food.   Hx of prostate cancer PSA review stable,  last undetectable Was released by urology  No urinary symptoms, continue monitoring  CKD III ([place]) Discussed how what you eat and drink can aide in kidney protection. Stay well hydrated. Avoid high salt foods. Avoid NSAIDS. Keep BP and BG well controlled.   Take medications as prescribed. Remain active and exercise as tolerated daily. Maintain weight.  Continue to monitor. Check CMP/GFR/Microablumin  Former smoker - 30 pack year, quit 2020 Low-dose screening CT annually - ordered to schedule  History of skin cancer Continue close follow up by dermatology  Familial tremor Continue propranolol, avoid excess alcohol, monitor   Excess alcohol intake Limit to no more than 1-2 drinks/day patient has done well reducing intake  OSA on CPAP Mild per sleep study 2020 by Dr. Brett Fairy On CPAP, reduced alcohol  Orders Placed This Encounter  Procedures   CBC with Differential/Platelet   COMPLETE METABOLIC PANEL WITH GFR   Lipid panel   Hemoglobin A1c   VITAMIN D 25 Hydroxy (Vit-D Deficiency, Fractures)   Notify office for further evaluation and treatment, questions or concerns if any reported s/s fail to improve.   The patient was advised to call back or seek an in-person evaluation if any symptoms worsen or if the condition fails to improve as anticipated.   Further disposition pending results of labs. Discussed med's effects and SE's.    I discussed the assessment and treatment plan with the patient. The patient was provided an opportunity to ask questions and all were answered. The patient agreed with the plan and demonstrated an understanding of the instructions.  Discussed med's effects and SE's. Screening labs and tests as requested with regular follow-up as recommended.  I provided 35 minutes of face-to-face time during this encounter including counseling, chart review, and critical decision making was preformed.    Future Appointments  Date Time Provider [place]  02/14/2022 10:00 AM Ward Givens, NP [facility] None  05/31/2022 10:00 AM Unk Pinto, MD [facility] None  08/31/2022 11:00 AM Darrol Jump, NP [facility] None     Plan:   During the course of the visit the patient was educated and counseled about appropriate screening and preventive services including:   Pneumococcal vaccine  Influenza vaccine Prevnar 13 Td vaccine Screening electrocardiogram Colorectal cancer screening Diabetes screening Glaucoma screening Nutrition counseling    Subjective:  Juan Torres is a 76 y.o. male who presents for Medicare Annual Wellness Visit and 3 month follow up. He has History of prostate cancer; GERD (gastroesophageal reflux disease); Hyperlipidemia, mixed; Essential hypertension; Vitamin D deficiency; Medication management; BMI 24.0-24.9, adult; Former smoker (quit 12/11/2018, 30+ pack year history); CKD (chronic kidney disease) stage 3, GFR 30-59 ml/min ([place]); Abnormal glucose; Thoracic aorta atherosclerosis ([place]) by CT scan  09/12/2018; Emphysema lung ([place]); OSA on CPAP; Excessive drinking of alcohol; Other secondary hypertension; History of skin cancer; Familial tremor; and Hepatic steatosis on their problem list.  Overall he has no new concerns in clinic today.  He is very tearful.  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    Continues to enjoy riding his harley motorcycle.  He was diagnosed with mild OSA in 2020, reports wearing CPAP nightly, endorses 100% compliance with restorative sleep.   He had tremor, was taking valium but stopped after he quit excess alcohol (was drinking a fifth daily, now down to occasional glass of red wine). Doing well with propanolol.    he quit smoking Nov 2020; COPD changes per imaging (CT 01/09/2020). Denies sx. Has 30+ pack/year smoking hx, is undergoing annual low dose lung CT screenings. Last in 01/09/2020 with stable/unchanged findings.   he has a diagnosis of GERD which is  currently managed by OTC omeprazole daily. Denies breakthrough sx.   BMI is Body mass index is 23.19 kg/m., he has been working on diet and exercise.  Wt Readings from Last 3 Encounters:  01/25/22 171 lb (77.6 kg)  05/25/21 190 lb 12.8 oz (86.5 kg)  02/11/21 195 lb 6.4 oz (88.6 kg)   His blood pressure has been controlled at home, today their BP is BP: (!) 160/90  Feels this is due to crying and emotional.  He does workout. He denies chest pain, shortness of breath, dizziness.  He has aortic atherosclerosis per CT 12/2018   He is on cholesterol medication (rosuvastatin 40 mg daily, fenofibrate 134 mg daily, fish oil) and denies myalgias. His LDL cholesterol is at goal, trigs remain elevated. The cholesterol last visit was:   Lab Results  Component Value Date   CHOL 159 05/25/2021   HDL 53 05/25/2021   LDLCALC 74 05/25/2021   TRIG 229 (H) 05/25/2021   CHOLHDL 3.0 05/25/2021    He has been working on diet and exercise for glucose management, and denies hyperglycemia, hypoglycemia , nausea, paresthesia of the feet, polydipsia and polyuria. Last A1C in the office was:  Lab Results  Component Value Date   HGBA1C 5.4 05/25/2021   He has CKD III followed our office, has seen Dr. Justin Mend in the past. Last GFR was:  Lab Results  Component Value Date   GFRNONAA 45 (L) 05/20/2020   GFRNONAA 36 (L) 01/30/2020   GFRNONAA 43 (L) 10/30/2019   Patient is on Vitamin D supplement (taking 15000 IU daily):  Lab Results  Component Value Date   VD25OH 97 05/25/2021     Patient was treated  in 2014 for Prostate Ca with Brachytherapy, was released by urology.  Lab Results  Component Value Date   PSA <0.04 05/25/2021   PSA <0.04 05/20/2020   PSA <0.1 04/15/2019      Medication Review: Current Outpatient Medications on File Prior to Visit  Medication Sig Dispense Refill   B Complex Vitamins (VITAMIN B-COMPLEX PO) Take 1 tablet by mouth daily.     Cholecalciferol (VITAMIN D3) 125 MCG (5000 UT)  TABS Take 15,000 Units by mouth every morning.     ezetimibe (ZETIA) 10 MG tablet Take 1 tablet (10 mg total) by mouth daily for cholesterol. 90 tablet 3   famotidine (PEPCID) 20 MG tablet Take 1 tablet (20 mg total) by mouth 2 (two) times daily; before breakfast and before dinner 180 tablet 0   fenofibrate micronized (LOFIBRA) 134 MG capsule Take 1 capsule (134 mg total) by mouth daily (blood fats). 90 capsule 3   Magnesium 400 MG CAPS Take 400 mg by mouth every evening.      Omega-3 Fatty Acids (FISH OIL) 1000 MG CAPS Take 1,000 mg by mouth every morning.     omeprazole (PRILOSEC) 40 MG capsule Take 1 capsule (40 mg total) by mouth daily to prevent indigestion & heartburn 90 capsule 3   propranolol ER (INDERAL LA) 120 MG 24 hr capsule Take 1 capsule (120 mg total) by mouth daily for blood pressure and tremor. 90 capsule 3   rosuvastatin (CRESTOR) 40 MG tablet Take 1 tablet (40 mg total) by mouth daily for cholesterol 90 tablet 3   No current facility-administered medications on file prior to visit.    Allergies: Allergies  Allergen Reactions   Lipitor [Atorvastatin] Other (See Comments)    fatigued   Niacin And Related Other (See Comments)    Flushing   Penicillins Rash    Current Problems (verified) has History of prostate cancer; GERD (gastroesophageal reflux disease); Hyperlipidemia, mixed; Essential hypertension; Vitamin D deficiency; Medication management; BMI 24.0-24.9, adult; Former smoker (quit 12/11/2018, 30+ pack year history); CKD (chronic kidney disease) stage 3, GFR 30-59 ml/min ([place]); Abnormal glucose; Thoracic aorta atherosclerosis ([place]) by CT scan  09/12/2018; Emphysema lung ([place]); OSA on CPAP; Excessive drinking of alcohol; Other secondary hypertension; History of skin cancer; Familial tremor; and Hepatic steatosis on their problem list.  Screening Tests Immunization History  Administered Date(s) Administered   DT (Pediatric) 10/20/2011   PFIZER(Purple Top)SARS-COV-2  Vaccination 04/04/2019, 04/30/2019   Pneumococcal Conjugate-13 11/14/2017   Pneumococcal Polysaccharide-23 04/28/2016   Zoster, Live 10/20/2011   Preventative care: Last colonoscopy: 12/02/2011 due 2023 Chest CT screen: 01/09/2020 - benign nodules, aortic atherosclerosis, emphysema , next ordered   Prior vaccinations: TD or Tdap: 2013  Influenza: DUE declines  Pneumococcal 23: 04/28/2016 Prevnar13: 11/2017 Shingles/Zostavax: 10/20/2011 Covid 19: 2/2, 2021, pfizer  Names of Other Physician/Practitioners you currently use: 1. [facility] here for primary care 2. Dr. Katy Fitch, eye doctor, last visit  2023,  3. Dr. Mariea Clonts, dentist, last visit 2023, goes q15m4. [facility], goes annually, has upcoming appointment   Patient Care Team: MUnk Pinto MD as PCP - General (Internal Medicine) MArloa Koh MD (Inactive) as Consulting Physician (Radiation Oncology) GClent Jacks MD as Consulting Physician (Ophthalmology) WEdrick Oh MD as Consulting Physician (Nephrology)  Surgical: He  has a past surgical history that includes Prostate biopsy (12/19/2011); Colonoscopy w/ polypectomy; Radioactive seed implant (N/A, 04/04/2012); Cystoscopy (N/A, 04/04/2012); Eye surgery; and Inguinal hernia repair (Bilateral, 10/18/2018). Family His  family history includes Breast cancer in his mother; Cancer in his maternal grandfather; Dementia in his father; Heart attack (age of onset: 73) in his father; Prostate cancer in his cousin and maternal uncle; Ulcers in his maternal grandmother. Social history  He reports that he quit smoking about 3 years ago. His smoking use included cigarettes. He has a 30.00 pack-year smoking history. He has never used smokeless tobacco. He reports that he does not currently use alcohol after a past usage of about 8.0 standard drinks of alcohol per week. He reports current drug use. Frequency: 7.00 times per week.  Drug: Marijuana.  MEDICARE WELLNESS OBJECTIVES: Physical activity:   Cardiac risk factors:   Depression/mood screen:      01/25/2022    1:53 PM  Depression screen PHQ 2/9  Decreased Interest 0  Down, Depressed, Hopeless 1  PHQ - 2 Score 1    ADLs:     01/25/2022    1:52 PM 05/24/2021   10:17 PM  In your present state of health, do you have any difficulty performing the following activities:  Hearing? 0 0  Vision? 0 0  Difficulty concentrating or making decisions? 0 0  Walking or climbing stairs? 0 0  Dressing or bathing? 0 0  Doing errands, shopping? 0 0  Preparing Food and eating ? N   Using the Toilet? N   In the past six months, have you accidently leaked urine? N   Do you have problems with loss of bowel control? N   Managing your Medications? N   Managing your Finances? N   Housekeeping or managing your Housekeeping? N      Cognitive Testing  Alert? Yes  Normal Appearance?Yes  Oriented to person? Yes  Place? Yes   Time? Yes  Recall of three objects?  Yes  Can perform simple calculations? Yes  Displays appropriate judgment?Yes  Can read the correct time from a watch face?Yes  EOL planning: Does Patient Have a Medical Advance Directive?: No   Review of Systems  Constitutional:  Negative for malaise/fatigue and weight loss.  HENT:  Negative for hearing loss and tinnitus.   Eyes:  Negative for blurred vision and double vision.  Respiratory:  Negative for cough, sputum production, shortness of breath and wheezing.   Cardiovascular:  Negative for chest pain, palpitations, orthopnea, claudication, leg swelling and PND.  Gastrointestinal:  Negative for abdominal pain, blood in stool, constipation, diarrhea, heartburn, melena, nausea and vomiting.  Genitourinary: Negative.   Musculoskeletal:  Negative for falls, joint pain and myalgias.  Skin:  Negative for rash.  Neurological:  Negative for dizziness, tingling, tremors (improved), sensory change, weakness and  headaches.  Endo/Heme/Allergies:  Negative for polydipsia.  Psychiatric/Behavioral: Negative.  Negative for depression, memory loss, substance abuse and suicidal ideas. The patient is not nervous/anxious and does not have insomnia.   All other systems reviewed and are negative.    Objective:   Today's Vitals   01/25/22 1131  BP: (!) 160/90  Pulse: 68  Temp: (!) 97.3 F (36.3 C)  SpO2: 98%  Weight: 171 lb (77.6 kg)  Height: 6' (1.829 m)   Body mass index is 23.19 kg/m.  General appearance: alert, red through face/neck , no distress, WD/WN, male HEENT: normocephalic, sclerae anicteric, TMs pearly, nares patent, no discharge or erythema, pharynx normal, normal hearing with bilateral hearing aids Oral cavity: MMM, no lesions Neck: supple, no lymphadenopathy, no thyromegaly, no masses Heart: RRR, normal S1, S2, no murmurs Lungs: Symmetrical, clear, not diminished,  no wheezes, rhonchi, or rales Abdomen: +bs, soft,  non distended,  No palpable organomegaly or hernias Musculoskeletal: No deformity, no swelling Extremities: no edema, no cyanosis, no clubbing Pulses: 2+ symmetric, upper and lower extremities, normal cap refill Neurological: alert, oriented x 3, CN2-12 intact, strength normal upper extremities and lower extremities, sensation normal throughout, DTRs 2+ throughout, no cerebellar signs, gait normal. No tremor.  Psychiatric: normal affect, behavior normal, pleasant   Medicare Attestation I have personally reviewed: The patient's medical and social history Their use of alcohol, tobacco or illicit drugs Their current medications and supplements The patient's functional ability including ADLs,fall risks, home safety risks, cognitive, and hearing and visual impairment Diet and physical activities Evidence for depression or mood disorders  The patient's weight, height, BMI, and visual acuity have been recorded in the chart.  I have made referrals, counseling, and provided  education to the patient based on review of the above and I have provided the patient with a written personalized care plan for preventive services.     Darrol Jump, NP   01/25/2022

## 2022-01-26 ENCOUNTER — Other Ambulatory Visit: Payer: Self-pay

## 2022-01-26 LAB — HEMOGLOBIN A1C
Hgb A1c MFr Bld: 5.5 % of total Hgb (ref <5.7)
Mean Plasma Glucose: 111 mg/dL
eAG (mmol/L): 6.2 mmol/L

## 2022-01-26 LAB — COMPLETE METABOLIC PANEL WITH GFR
AG Ratio: 2.4 (calc) (ref 1.0–2.5)
ALT: 32 U/L (ref 9–46)
AST: 63 U/L — ABNORMAL HIGH (ref 10–35)
Albumin: 4.5 g/dL (ref 3.6–5.1)
Alkaline phosphatase (APISO): 42 U/L (ref 35–144)
BUN/Creatinine Ratio: 5 (calc) — ABNORMAL LOW (ref 6–22)
BUN: 8 mg/dL (ref 7–25)
CO2: 29 mmol/L (ref 20–32)
Calcium: 9.9 mg/dL (ref 8.6–10.3)
Chloride: 106 mmol/L (ref 98–110)
Creat: 1.49 mg/dL — ABNORMAL HIGH (ref 0.70–1.28)
Globulin: 1.9 g/dL (calc) (ref 1.9–3.7)
Glucose, Bld: 100 mg/dL — ABNORMAL HIGH (ref 65–99)
Potassium: 4.4 mmol/L (ref 3.5–5.3)
Sodium: 144 mmol/L (ref 135–146)
Total Bilirubin: 1.1 mg/dL (ref 0.2–1.2)
Total Protein: 6.4 g/dL (ref 6.1–8.1)
eGFR: 48 mL/min/{1.73_m2} — ABNORMAL LOW (ref >=60)

## 2022-01-26 LAB — CBC WITH DIFFERENTIAL/PLATELET
Absolute Monocytes: 525 cells/uL (ref 200–950)
Basophils Absolute: 30 cells/uL (ref 0–200)
Basophils Relative: 0.6 %
Eosinophils Absolute: 30 cells/uL (ref 15–500)
Eosinophils Relative: 0.6 %
HCT: 51 % — ABNORMAL HIGH (ref 38.5–50.0)
Hemoglobin: 18.4 g/dL — ABNORMAL HIGH (ref 13.2–17.1)
Lymphs Abs: 995 cells/uL (ref 850–3900)
MCH: 37.5 pg — ABNORMAL HIGH (ref 27.0–33.0)
MCHC: 36.1 g/dL — ABNORMAL HIGH (ref 32.0–36.0)
MCV: 103.9 fL — ABNORMAL HIGH (ref 80.0–100.0)
MPV: 10.3 fL (ref 7.5–12.5)
Monocytes Relative: 10.5 %
Neutro Abs: 3420 cells/uL (ref 1500–7800)
Neutrophils Relative %: 68.4 %
Platelets: 128 10*3/uL — ABNORMAL LOW (ref 140–400)
RBC: 4.91 10*6/uL (ref 4.20–5.80)
RDW: 12.2 % (ref 11.0–15.0)
Total Lymphocyte: 19.9 %
WBC: 5 10*3/uL (ref 3.8–10.8)

## 2022-01-26 LAB — LIPID PANEL
Cholesterol: 132 mg/dL (ref <200)
HDL: 67 mg/dL (ref >=40)
LDL Cholesterol (Calc): 46 mg/dL (calc)
Non-HDL Cholesterol (Calc): 65 mg/dL (calc) (ref <130)
Total CHOL/HDL Ratio: 2 (calc) (ref <5.0)
Triglycerides: 105 mg/dL (ref <150)

## 2022-01-26 LAB — VITAMIN D 25 HYDROXY (VIT D DEFICIENCY, FRACTURES): Vit D, 25-Hydroxy: 117 ng/mL — ABNORMAL HIGH (ref 30–100)

## 2022-01-27 ENCOUNTER — Other Ambulatory Visit (HOSPITAL_COMMUNITY): Payer: Self-pay

## 2022-02-09 ENCOUNTER — Other Ambulatory Visit (HOSPITAL_COMMUNITY): Payer: Self-pay

## 2022-02-14 ENCOUNTER — Encounter: Payer: Self-pay | Admitting: Adult Health

## 2022-02-14 ENCOUNTER — Other Ambulatory Visit (HOSPITAL_COMMUNITY): Payer: Self-pay

## 2022-02-14 ENCOUNTER — Ambulatory Visit: Payer: Medicare HMO | Admitting: Adult Health

## 2022-02-17 ENCOUNTER — Other Ambulatory Visit (HOSPITAL_COMMUNITY): Payer: Self-pay

## 2022-02-17 ENCOUNTER — Other Ambulatory Visit: Payer: Self-pay | Admitting: Adult Health Nurse Practitioner

## 2022-02-17 DIAGNOSIS — E782 Mixed hyperlipidemia: Secondary | ICD-10-CM

## 2022-02-17 MED ORDER — ROSUVASTATIN CALCIUM 40 MG PO TABS
40.0000 mg | ORAL_TABLET | Freq: Every day | ORAL | 3 refills | Status: DC
  Filled 2022-02-17: qty 90, 90d supply, fill #0
  Filled 2022-05-20: qty 90, 90d supply, fill #1
  Filled 2022-08-29: qty 90, 90d supply, fill #2
  Filled 2022-11-28: qty 90, 90d supply, fill #3

## 2022-03-08 ENCOUNTER — Other Ambulatory Visit: Payer: Self-pay | Admitting: Internal Medicine

## 2022-03-08 DIAGNOSIS — K219 Gastro-esophageal reflux disease without esophagitis: Secondary | ICD-10-CM

## 2022-03-08 MED ORDER — OMEPRAZOLE 40 MG PO CPDR
40.0000 mg | DELAYED_RELEASE_CAPSULE | Freq: Every day | ORAL | 3 refills | Status: DC
  Filled 2022-03-08: qty 90, 90d supply, fill #0
  Filled 2022-06-10: qty 90, 90d supply, fill #1
  Filled 2022-09-13: qty 90, 90d supply, fill #2
  Filled 2022-12-26: qty 90, 90d supply, fill #3

## 2022-03-09 ENCOUNTER — Other Ambulatory Visit (HOSPITAL_COMMUNITY): Payer: Self-pay

## 2022-03-28 ENCOUNTER — Other Ambulatory Visit (HOSPITAL_COMMUNITY): Payer: Self-pay

## 2022-04-11 ENCOUNTER — Encounter: Payer: Self-pay | Admitting: Internal Medicine

## 2022-04-27 ENCOUNTER — Other Ambulatory Visit: Payer: Self-pay

## 2022-04-29 ENCOUNTER — Other Ambulatory Visit (HOSPITAL_COMMUNITY): Payer: Self-pay

## 2022-04-29 ENCOUNTER — Other Ambulatory Visit: Payer: Self-pay | Admitting: Internal Medicine

## 2022-04-29 DIAGNOSIS — G250 Essential tremor: Secondary | ICD-10-CM

## 2022-04-29 DIAGNOSIS — I10 Essential (primary) hypertension: Secondary | ICD-10-CM

## 2022-04-29 MED ORDER — PROPRANOLOL HCL ER 120 MG PO CP24
120.0000 mg | ORAL_CAPSULE | Freq: Every day | ORAL | 3 refills | Status: DC
  Filled 2022-04-29: qty 90, 90d supply, fill #0
  Filled 2022-08-01: qty 90, 90d supply, fill #1
  Filled 2022-11-01: qty 90, 90d supply, fill #2
  Filled 2023-02-01 – 2023-02-02 (×2): qty 90, 90d supply, fill #3

## 2022-05-01 ENCOUNTER — Other Ambulatory Visit: Payer: Self-pay

## 2022-05-02 ENCOUNTER — Other Ambulatory Visit (HOSPITAL_COMMUNITY): Payer: Self-pay

## 2022-05-02 ENCOUNTER — Other Ambulatory Visit: Payer: Self-pay

## 2022-05-30 ENCOUNTER — Encounter: Payer: Self-pay | Admitting: Internal Medicine

## 2022-05-30 NOTE — Patient Instructions (Signed)
Due to recent changes in healthcare laws, you may see the results of your imaging and laboratory studies on MyChart before your provider has had a chance to review them.  We understand that in some cases there may be results that are confusing or concerning to you. Not all laboratory results come back in the same time frame and the provider may be waiting for multiple results in order to interpret others.  Please give us 48 hours in order for your provider to thoroughly review all the results before contacting the office for clarification of your results.   +++++++++++++++++++++++++++++++  Vit D  & Vit C 1,000 mg   are recommended to help protect  against the Covid-19 and other Corona viruses.    Also it's recommended  to take  Zinc 50 mg  to help  protect against the Covid-19   and best place to get  is also on [employer]  and don't pay more than 6-8 cents /pill !  ================================ Coronavirus (COVID-19) Are you at risk?  Are you at risk for the Coronavirus (COVID-19)?  To be considered HIGH RISK for Coronavirus (COVID-19), you have to meet the following criteria:  Traveled to [place], [place], [place], [place] or [place]; or in the [place] to [place], [place], [place]  or [place]; and have fever, cough, and shortness of breath within the last 2 weeks of travel OR Been in close contact with a person diagnosed with COVID-19 within the last 2 weeks and have  fever, cough,and shortness of breath  IF YOU DO NOT MEET THESE CRITERIA, YOU ARE CONSIDERED LOW RISK FOR COVID-19.  What to do if you are HIGH RISK for COVID-19?  If you are having a medical emergency, call 911. Seek medical care right away. Before you go to a doctor's office, urgent care or emergency department,  call ahead and tell them about your recent travel, contact with someone diagnosed with COVID-19   and your symptoms.  You should receive instructions from your physician's office regarding  next steps of care.  When you arrive at healthcare provider, tell the healthcare staff immediately you have returned from  visiting [place], [place], [place], [place] or [place]; or traveled in the [place] to [place], [place],  [place] or [place] in the last two weeks or you have been in close contact with a person diagnosed with  COVID-19 in the last 2 weeks.   Tell the health care staff about your symptoms: fever, cough and shortness of breath. After you have been seen by a medical provider, you will be either: Tested for (COVID-19) and discharged home on quarantine except to seek medical care if  symptoms worsen, and asked to  Stay home and avoid contact with others until you get your results (4-5 days)  Avoid travel on public transportation if possible (such as bus, train, or airplane) or Sent to the Emergency Department by EMS for evaluation, COVID-19 testing  and  possible admission depending on your condition and test results.  What to do if you are LOW RISK for COVID-19?  Reduce your risk of any infection by using the same precautions used for avoiding the common cold or flu:  Wash your hands often with soap and warm water for at least 20 seconds.  If soap and water are not readily available,  use an alcohol-based hand sanitizer with at least 60% alcohol.  If coughing or sneezing, cover your mouth and nose by coughing   or sneezing into the elbow areas of your shirt or coat,  into a tissue or into your sleeve (not your hands). Avoid shaking hands with others and consider head nods or verbal greetings only. Avoid touching your eyes, nose, or mouth with unwashed hands.  Avoid close contact with people who are sick. Avoid places or events with large numbers of people in one location, like concerts or sporting events. Carefully consider travel plans you have or are making. If you are planning any travel outside or inside the [place], visit the CDC's Travelers' Health webpage for  the latest health notices. If you have some symptoms but not all symptoms, continue to monitor at home and seek medical attention  if your symptoms worsen. If you are having a medical emergency, call 911. >>>>>>>>>>>>>>>>>>>>>>>>>>>>>>>>>>>>>>>>>>>>>>>>>>> We Do NOT Approve of [employer] > > > > > > > > > > > > > > > > > > > > > > > > > > > > > > > > > > >  > >    Preventive Care for Adults  A healthy lifestyle and preventive care can promote health and wellness. Preventive health guidelines for men include the following key practices: A routine yearly physical is a good way to check with your health care provider about your health and preventative screening. It is a chance to share any concerns and updates on your health and to receive a thorough exam. Visit your dentist for a routine exam and preventative care every 6 months. Brush your teeth twice a day and floss once a day. Good oral hygiene prevents tooth decay and gum disease. The frequency of eye exams is based on your age, health, family medical history, use of contact lenses, and other factors. Follow your health care provider's recommendations for frequency of eye exams. Eat a healthy diet. Foods such as vegetables, fruits, whole grains, low-fat dairy products, and lean protein foods contain the nutrients you need without too many calories. Decrease your intake of foods high in solid fats, added sugars, and salt. Eat the right amount of calories for you. Get information about a proper diet from your health care provider, if necessary. Regular physical exercise is one of the most important things you can do for your health. Most adults should get at least 150 minutes of moderate-intensity exercise (any activity that increases your heart rate and causes you to sweat) each week. In addition, most adults need muscle-strengthening exercises on 2 or more days a week. Maintain a healthy weight. The body mass index (BMI) is a screening  tool to identify possible weight problems. It provides an estimate of body fat based on height and weight. Your health care provider can find your BMI and can help you achieve or maintain a healthy weight. For adults 20 years and older: A BMI below 18.5 is considered underweight. A BMI of 18.5 to 24.9 is normal. A BMI of 25 to 29.9 is considered overweight. A BMI of 30 and above is considered obese. Maintain normal blood lipids and cholesterol levels by exercising and minimizing your intake of saturated fat. Eat a balanced diet with plenty of fruit and vegetables. Blood tests for lipids and cholesterol should begin at age 20 and be repeated every 5 years. If your lipid or cholesterol levels are high, you are over 50, or you are at high risk for heart disease, you may need your cholesterol levels checked more frequently. Ongoing high lipid and cholesterol levels should be   treated with medicines if diet and exercise are not working. If you smoke, find out from your health care provider how to quit. If you do not use tobacco, do not start. Lung cancer screening is recommended for adults aged 55-80 years who are at high risk for developing lung cancer because of a history of smoking. A yearly low-dose CT scan of the lungs is recommended for people who have at least a 30-pack-year history of smoking and are a current smoker or have quit within the past 15 years. A pack year of smoking is smoking an average of 1 pack of cigarettes a day for 1 year (for example: 1 pack a day for 30 years or 2 packs a day for 15 years). Yearly screening should continue until the smoker has stopped smoking for at least 15 years. Yearly screening should be stopped for people who develop a health problem that would prevent them from having lung cancer treatment. If you choose to drink alcohol, do not have more than 2 drinks per day. One drink is considered to be 12 ounces (355 mL) of beer, 5 ounces (148 mL) of wine, or 1.5 ounces (44  mL) of liquor. Avoid use of street drugs. Do not share needles with anyone. Ask for help if you need support or instructions about stopping the use of drugs. High blood pressure causes heart disease and increases the risk of stroke. Your blood pressure should be checked at least every 1-2 years. Ongoing high blood pressure should be treated with medicines, if weight loss and exercise are not effective. If you are 45-79 years old, ask your health care provider if you should take aspirin to prevent heart disease. Diabetes screening involves taking a blood sample to check your fasting blood sugar level. Testing should be considered at a younger age or be carried out more frequently if you are overweight and have at least 1 risk factor for diabetes. Colorectal cancer can be detected and often prevented. Most routine colorectal cancer screening begins at the age of 50 and continues through age 75. However, your health care provider may recommend screening at an earlier age if you have risk factors for colon cancer. On a yearly basis, your health care provider may provide home test kits to check for hidden blood in the stool. Use of a small camera at the end of a tube to directly examine the colon (sigmoidoscopy or colonoscopy) can detect the earliest forms of colorectal cancer. Talk to your health care provider about this at age 50, when routine screening begins. Direct exam of the colon should be repeated every 5-10 years through age 75, unless early forms of precancerous polyps or small growths are found. Hepatitis C blood testing is recommended for all people born from 1945 through 1965 and any individual with known risks for hepatitis C. Screening for abdominal aortic aneurysm (AAA)  by ultrasound is recommended for people who have history of high blood pressure or who are current or former smokers. Healthy men should  receive prostate-specific antigen (PSA) blood tests as part of routine cancer screening.  Talk with your health care provider about prostate cancer screening. Testicular cancer screening is  recommended for adult males. Screening includes self-exam, a health care provider exam, and other screening tests. Consult with your health care provider about any symptoms you have or any concerns you have about testicular cancer. Use sunscreen. Apply sunscreen liberally and repeatedly throughout the day. You should seek shade when your shadow is shorter than   you. Protect yourself by wearing long sleeves, pants, a wide-brimmed hat, and sunglasses year round, whenever you are outdoors. Once a month, do a whole-body skin exam, using a mirror to look at the skin on your back. Tell your health care provider about new moles, moles that have irregular borders, moles that are larger than a pencil eraser, or moles that have changed in shape or color. Stay current with required vaccines (immunizations). Influenza vaccine. All adults should be immunized every year. Tetanus, diphtheria, and acellular pertussis (Td, Tdap) vaccine. An adult who has not previously received Tdap or who does not know his vaccine status should receive 1 dose of Tdap. This initial dose should be followed by tetanus and diphtheria toxoids (Td) booster doses every 10 years. Adults with an unknown or incomplete history of completing a 3-dose immunization series with Td-containing vaccines should begin or complete a primary immunization series including a Tdap dose. Adults should receive a Td booster every 10 years. Zoster vaccine. One dose is recommended for adults aged 60 years or older unless certain conditions are present.  PREVNAR - Pneumococcal 13-valent conjugate (PCV13) vaccine. When indicated, a person who is uncertain of his immunization history and has no record of immunization should receive the PCV13 vaccine. An adult aged 19 years or older who has certain medical conditions and has not been previously immunized should receive 1  dose of PCV13 vaccine. This PCV13 should be followed with a dose of pneumococcal polysaccharide (PPSV23) vaccine. The PPSV23 vaccine dose should be obtained 1 or more year(s)after the dose of PCV13 vaccine. An adult aged 19 years or older who has certain medical conditions and previously received 1 or more doses of PPSV23 vaccine should receive 1 dose of PCV13. The PCV13 vaccine dose should be obtained 1 or more years after the last PPSV23 vaccine dose.  PNEUMOVAX - Pneumococcal polysaccharide (PPSV23) vaccine. When PCV13 is also indicated, PCV13 should be obtained first. All adults aged 65 years and older should be immunized. An adult younger than age 65 years who has certain medical conditions should be immunized. Any person who resides in a nursing home or long-term care facility should be immunized. An adult smoker should be immunized. People with an immunocompromised condition and certain other conditions should receive both PCV13 and PPSV23 vaccines. People with human immunodeficiency virus (HIV) infection should be immunized as soon as possible after diagnosis. Immunization during chemotherapy or radiation therapy should be avoided. Routine use of PPSV23 vaccine is not recommended for American Indians, [place] Natives, or people younger than 65 years unless there are medical conditions that require PPSV23 vaccine. When indicated, people who have unknown immunization and have no record of immunization should receive PPSV23 vaccine. One-time revaccination 5 years after the first dose of PPSV23 is recommended for people aged 19-64 years who have chronic kidney failure, nephrotic syndrome, asplenia, or immunocompromised conditions. People who received 1-2 doses of PPSV23 before age 65 years should receive another dose of PPSV23 vaccine at age 65 years or later if at least 5 years have passed since the previous dose. Doses of PPSV23 are not needed for people immunized with PPSV23 at or after age 65  years.  Hepatitis A vaccine. Adults who wish to be protected from this disease, have certain high-risk conditions, work with hepatitis A-infected animals, work in hepatitis A research labs, or travel to or work in countries with a high rate of hepatitis A should be immunized. Adults who were previously unvaccinated and who anticipate close contact   with an international adoptee during the first 60 days after arrival in the [place] from a country with a high rate of hepatitis A should be immunized.  Hepatitis B vaccine. Adults should be immunized if they wish to be protected from this disease, have certain high-risk conditions, may be exposed to blood or other infectious body fluids, are household contacts or sex partners of hepatitis B positive people, are clients or workers in certain care facilities, or travel to or work in countries with a high rate of hepatitis B.  Preventive Service / Frequency  Ages 65 and over Blood pressure check. Lipid and cholesterol check. Lung cancer screening. / Every year if you are aged 55-80 years and have a 30-pack-year history of smoking and currently smoke or have quit within the past 15 years. Yearly screening is stopped once you have quit smoking for at least 15 years or develop a health problem that would prevent you from having lung cancer treatment. Fecal occult blood test (FOBT) of stool. You may not have to do this test if you get a colonoscopy every 10 years. Flexible sigmoidoscopy** or colonoscopy.** / Every 5 years for a flexible sigmoidoscopy or every 10 years for a colonoscopy beginning at age 50 and continuing until age 75. Hepatitis C blood test.** / For all people born from 1945 through 1965 and any individual with known risks for hepatitis C. Abdominal aortic aneurysm (AAA) screening./ Screening current or former smokers or have Hypertension. Skin self-exam. / Monthly. Influenza vaccine. / Every year. Tetanus, diphtheria, and acellular  pertussis (Tdap/Td) vaccine.** / 1 dose of Td every 10 years.  Zoster vaccine.** / 1 dose for adults aged 60 years or older.         Pneumococcal 13-valent conjugate (PCV13) vaccine.   Pneumococcal polysaccharide (PPSV23) vaccine.   Hepatitis A vaccine.** / Consult your health care provider. Hepatitis B vaccine.** / Consult your health care provider. Screening for abdominal aortic aneurysm (AAA)  by ultrasound is recommended for people who have history of high blood pressure or who are current or former smokers. ++++++++++ Recommend Adult Low Dose Aspirin or  coated  Aspirin 81 mg daily  To reduce risk of Colon Cancer 40 %,  Skin Cancer 26 % ,  Malignant Melanoma 46%  and  Pancreatic cancer 60% ++++++++++++++++++++++ Vitamin D goal  is between 70-100.  Please make sure that you are taking your Vitamin D as directed.  It is very important as a natural anti-inflammatory  helping hair, skin, and nails, as well as reducing stroke and heart attack risk.  It helps your bones and helps with mood. It also decreases numerous cancer risks so please take it as directed.  Low Vit D is associated with a 200-300% higher risk for CANCER  and 200-300% higher risk for HEART   ATTACK  &  STROKE.   ...................................... It is also associated with higher death rate at younger ages,  autoimmune diseases like Rheumatoid arthritis, Lupus, Multiple Sclerosis.    Also many other serious conditions, like depression, Alzheimer's Dementia, infertility, muscle aches, fatigue, fibromyalgia - just to name a few. ++++++++++++++++++++++ Recommend the book "The END of DIETING" by Dr Joel Fuhrman  & the book "The END of DIABETES " by Dr Joel Fuhrman At [employer].com - get book & Audio CD's    Being diabetic has a  300% increased risk for heart attack, stroke, cancer, and alzheimer- type vascular dementia. It is very important that you work harder with diet by   avoiding all foods that are white. Avoid  white rice (brown & wild rice is OK), white potatoes (sweetpotatoes in moderation is OK), White bread or wheat bread or anything made out of white flour like bagels, donuts, rolls, buns, biscuits, cakes, pastries, cookies, pizza crust, and pasta (made from white flour & egg whites) - vegetarian pasta or spinach or wheat pasta is OK. Multigrain breads like Arnold's or Pepper[employer], or multigrain sandwich thins or flatbreads.  Diet, exercise and weight loss can reverse and cure diabetes in the early stages.  Diet, exercise and weight loss is very important in the control and prevention of complications of diabetes which affects every system in your body, ie. Brain - dementia/stroke, eyes - glaucoma/blindness, heart - heart attack/heart failure, kidneys - dialysis, stomach - gastric paralysis, intestines - malabsorption, nerves - severe painful neuritis, circulation - gangrene & loss of a leg(s), and finally cancer and Alzheimers.    I recommend avoid fried & greasy foods,  sweets/candy, white rice (brown or wild rice or Quinoa is OK), white potatoes (sweet potatoes are OK) - anything made from white flour - bagels, doughnuts, rolls, buns, biscuits,white and wheat breads, pizza crust and traditional pasta made of white flour & egg white(vegetarian pasta or spinach or wheat pasta is OK).  Multi-grain bread is OK - like multi-grain flat bread or sandwich thins. Avoid alcohol in excess. Exercise is also important.    Eat all the vegetables you want - avoid meat, especially red meat and dairy - especially cheese.  Cheese is the most concentrated form of trans-fats which is the worst thing to clog up our arteries. Veggie cheese is OK which can be found in the fresh produce section at [employer] or [employer] or [employer]  ++++++++++++++++++++++ DASH Eating Plan  DASH stands for "Dietary Approaches to Stop Hypertension."   The DASH eating plan is a healthy eating plan that has been shown to reduce high  blood pressure (hypertension). Additional health benefits may include reducing the risk of type 2 diabetes mellitus, heart disease, and stroke. The DASH eating plan may also help with weight loss. WHAT DO I NEED TO KNOW ABOUT THE DASH EATING PLAN? For the DASH eating plan, you will follow these general guidelines: Choose foods with a percent daily value for sodium of less than 5% (as listed on the food label). Use salt-free seasonings or herbs instead of table salt or sea salt. Check with your health care provider or pharmacist before using salt substitutes. Eat lower-sodium products, often labeled as "lower sodium" or "no salt added." Eat fresh foods. Eat more vegetables, fruits, and low-fat dairy products. Choose whole grains. Look for the word "whole" as the first word in the ingredient list. Choose fish  Limit sweets, desserts, sugars, and sugary drinks. Choose heart-healthy fats. Eat veggie cheese  Eat more home-cooked food and less restaurant, buffet, and fast food. Limit fried foods. Cook foods using methods other than frying. Limit canned vegetables. If you do use them, rinse them well to decrease the sodium. When eating at a restaurant, ask that your food be prepared with less salt, or no salt if possible.                      WHAT FOODS CAN I EAT? Read Dr Joel Fuhrman's books on The End of Dieting & The End of Diabetes  Grains Whole grain or whole wheat bread. Brown rice. Whole grain or whole wheat pasta. Quinoa, bulgur, and   whole grain cereals. Low-sodium cereals. Corn or whole wheat flour tortillas. Whole grain cornbread. Whole grain crackers. Low-sodium crackers.  Vegetables Fresh or frozen vegetables (raw, steamed, roasted, or grilled). Low-sodium or reduced-sodium tomato and vegetable juices. Low-sodium or reduced-sodium tomato sauce and paste. Low-sodium or reduced-sodium canned vegetables.   Fruits All fresh, canned (in natural juice), or frozen fruits.  Protein  Products  All fish and seafood.  Dried beans, peas, or lentils. Unsalted nuts and seeds. Unsalted canned beans.  Dairy Low-fat dairy products, such as skim or 1% milk, 2% or reduced-fat cheeses, low-fat ricotta or cottage cheese, or plain low-fat yogurt. Low-sodium or reduced-sodium cheeses.  Fats and Oils Tub margarines without trans fats. Light or reduced-fat mayonnaise and salad dressings (reduced sodium). Avocado. Safflower, olive, or canola oils. Natural peanut or almond butter.  Other Unsalted popcorn and pretzels. The items listed above may not be a complete list of recommended foods or beverages. Contact your dietitian for more options.  ++++++++++++++++++++  WHAT FOODS ARE NOT RECOMMENDED? Grains/ White flour or wheat flour White bread. White pasta. White rice. Refined cornbread. Bagels and croissants. Crackers that contain trans fat.  Vegetables  Creamed or fried vegetables. Vegetables in a . Regular canned vegetables. Regular canned tomato sauce and paste. Regular tomato and vegetable juices.  Fruits Dried fruits. Canned fruit in light or heavy syrup. Fruit juice.  Meat and Other Protein Products Meat in general - RED meat & White meat.  Fatty cuts of meat. Ribs, chicken wings, all processed meats as bacon, sausage, bologna, salami, fatback, hot dogs, bratwurst and packaged luncheon meats.  Dairy Whole or 2% milk, cream, half-and-half, and cream cheese. Whole-fat or sweetened yogurt. Full-fat cheeses or blue cheese. Non-dairy creamers and whipped toppings. Processed cheese, cheese spreads, or cheese curds.  Condiments Onion and garlic salt, seasoned salt, table salt, and sea salt. Canned and packaged gravies. Worcestershire sauce. Tartar sauce. Barbecue sauce. Teriyaki sauce. Soy sauce, including reduced sodium. Steak sauce. Fish sauce. Oyster sauce. Cocktail sauce. Horseradish. Ketchup and mustard. Meat flavorings and tenderizers. Bouillon cubes. Hot sauce. Tabasco sauce.  Marinades. Taco seasonings. Relishes.  Fats and Oils Butter, stick margarine, lard, shortening and bacon fat. Coconut, palm kernel, or palm oils. Regular salad dressings.  Pickles and olives. Salted popcorn and pretzels.  The items listed above may not be a complete list of foods and beverages to avoid.   

## 2022-05-30 NOTE — Progress Notes (Unsigned)
Annual  Screening/Preventative Visit  & Comprehensive Evaluation & Examination   Future Appointments  Date Time Provider Department  05/31/2022                               cpe 10:00 AM Lucky Cowboy, MD [facility]  08/31/2022                              wellness 11:00 AM Adela Glimpse, NP [facility]  06/09/2023                                  cpe 10:00 AM Lucky Cowboy, MD [facility]            This very nice 77 y.o. MWM presents for a Screening /Preventative Visit & comprehensive evaluation and management of multiple medical co-morbidities.  Patient has been followed for HTN, HLD, Prediabetes and Vitamin D Deficiency.  Patient also has GERD controlled with his Omeprazole, hx/o  Prostate  Cancer treated in 2014 /Brachytherapy and   familial Tremor treated with Propranolol.  Chest CT scan in 2020 showed thoracic  Aortic Atherosclerosis.  Patient also has hx/o Alcoholism .                                                    Today's BP is at goal -                  .  HTN predates since  1998.   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   Patient's BP has been controlled at home.  Today's BP is at goal -  120/80. Patient denies any cardiac symptoms as chest pain, palpitations, shortness of breath, dizziness or ankle swelling.       Patient's hyperlipidemia is controlled with diet and Rosuvastatin, fenofibrate & ezetimibe . Patient denies myalgias or other medication SE's. Last lipids were at goal :  Lab Results  Component Value Date   CHOL 134 12/11/2020   HDL 39 (L) 12/11/2020   LDLCALC 74 12/11/2020   TRIG 120 12/11/2020   CHOLHDL 3.4 12/11/2020         Patient has hx/o prediabetes  (A1c 5.7% /2014)   and patient denies reactive hypoglycemic symptoms, visual blurring, diabetic polys or paresthesias. Last A1c was normal & at goal :   Lab Results  Component Value Date   HGBA1C 5.5 01/25/2022          Finally, patient has  history of Vitamin D Deficiency of    and last vitamin D was still slightly elevated & dose was tapered :    Lab Results  Component Value Date   VD25OH 117 (H) 01/25/2022       Current Outpatient Medications on File Prior to Visit  Medication Sig   B Complex Vitamins  Take 1 tablet by mouth daily.   VITAMIN D  5,000 u Take 15,000 Units by mouth every morning.   ezetimibe 10 MG tablet Take 1 tabletdaily for cholesterol.   famotidine  20 MG tablet Take 1 tablet 2 times daily  fenofibrate 134 MG capsule Take 1 capsule Daily    Magnesium 400 MG CAPS Take every evening   Omega-3 FISH OIL 1000 MG  Take every morning   omeprazole 40 MG capsule Take 1 capsule  daily    propranolol ER 120 MG  Take 1 capsule daily    rosuvastatin  40 MG tablet Take 1 tablet daily       Allergies  Allergen Reactions   Lipitor [Atorvastatin] Other (See Comments)    fatigued   Niacin And Related Other (See Comments)    Flushing   Penicillins Rash     Past Medical History:  Diagnosis Date   Adenomatous colon polyp    GERD (gastroesophageal reflux disease)    History of basal cell cancer 2012   Hyperlipidemia    Hypertension    Prediabetes    Prostate cancer (HCC) DX 12/19/11   bx=Adenocarcinoma,gleason=3+3=6,volume=42cc,PSA=4.63   Pulmonary nodules 12/10/2018   Numerous unchanged/benign appearing per CT lung 12/2018   Vitamin D deficiency      Health Maintenance  Topic Date Due   Zoster Vaccines- Shingrix (1 of 2) Never done   COVID-19 Vaccine (3 - Pfizer risk series) 05/28/2019   INFLUENZA VACCINE  09/07/2021   TETANUS/TDAP  10/19/2021   Pneumonia Vaccine 103+ Years old  Completed   Hepatitis C Screening  Completed   HPV VACCINES  Aged Out     Immunization History  Administered Date(s) Administered    DT                               10/20/2011   Pfizer SARS-COV-2 Vacc  04/04/2019, 04/30/2019   Pneumococcal -13 11/14/2017   Pneumococcal -23 04/28/2016   Zoster, Live 10/20/2011     Last Colon -  10.25.2013 - Dr Arlyce Dice - recc 10 year f/u due Nov 2023.     Past Surgical History:  Procedure Laterality Date   COLONOSCOPY W/ POLYPECTOMY     Adenomatous   CYSTOSCOPY N/A 04/04/2012   Procedure: CYSTOSCOPY;  Surgeon: Milford Cage, MD;  Location: [facility];  Service: Urology;  Laterality: N/A;   EYE SURGERY     bilateral cataracts    INGUINAL HERNIA REPAIR Bilateral 10/18/2018   Procedure: LAPAROSCOPIC BILATERAL INGUINAL HERNIA REPAIR;  Surgeon: Luretha Murphy, MD;  Location: [facility];  Service: General;  Laterality: Bilateral;   PROSTATE BIOPSY  12/19/2011   Procedure: BIOPSY TRANSRECTAL ULTRASONIC PROSTATE (TUBP);  Surgeon: Milford Cage, MD;  Location: [facility];  Service: Urology;  Laterality: N/A;     RADIOACTIVE SEED IMPLANT N/A 04/04/2012   Procedure: RADIOACTIVE SEED IMPLANT;  Surgeon: Milford Cage, MD;  Location: [facility];  Service: Urology;  Laterality: N/A;  Seeds Implanted     72  Seeds Found in Bladder     None      Family History  Problem Relation Age of Onset   Breast cancer Mother    Heart attack Father 66   Dementia Father    Prostate cancer Maternal Uncle        prostate   Ulcers Maternal Grandmother    Cancer Maternal Grandfather        colon   Prostate cancer Cousin        prostate cancer 1st maternal    Sleep apnea Neg Hx      Social History   Tobacco Use   Smoking status: Former  Packs/day: 1.00    Years: 30.00    Pack years: 30.00    Types: Cigarettes    Quit date: 12/11/2018    Years since quitting: 2.4   Smokeless tobacco: Never  Vaping Use   Vaping Use: Never used  Substance Use Topics   Alcohol use: Not Currently    Alcohol/week: 8.0 standard drinks    Types: 8 Glasses of wine per week   Drug use: Yes    Frequency: 7.0 times per week    Types: Marijuana    Comment: / marijuana-last smoked 10/15/2018      ROS Constitutional: Denies  fever, chills, weight loss/gain, headaches, insomnia,  night sweats or change in appetite. Does c/o fatigue. Eyes: Denies redness, blurred vision, diplopia, discharge, itchy or watery eyes.  ENT: Denies discharge, congestion, post nasal drip, epistaxis, sore throat, earache, hearing loss, dental pain, Tinnitus, Vertigo, Sinus pain or snoring.  Cardio: Denies chest pain, palpitations, irregular heartbeat, syncope, dyspnea, diaphoresis, orthopnea, PND, claudication or edema Respiratory: denies cough, dyspnea, DOE, pleurisy, hoarseness, laryngitis or wheezing.  Gastrointestinal: Denies dysphagia, heartburn, reflux, water brash, pain, cramps, nausea, vomiting, bloating, diarrhea, constipation, hematemesis, melena, hematochezia, jaundice or hemorrhoids Genitourinary: Denies dysuria, frequency, urgency, nocturia, hesitancy, discharge, hematuria or flank pain Musculoskeletal: Denies arthralgia, myalgia, stiffness, Jt. Swelling, pain, limp or strain/sprain. Denies Falls. Skin: Denies puritis, rash, hives, warts, acne, eczema or change in skin lesion Neuro: No weakness, tremor, incoordination, spasms, paresthesia or pain Psychiatric: Denies confusion, memory loss or sensory loss. Denies Depression. Endocrine: Denies change in weight, skin, hair change, nocturia, and paresthesia, diabetic polys, visual blurring or hyper / hypo glycemic episodes.  Heme/Lymph: No excessive bleeding, bruising or enlarged lymph nodes.   Physical Exam  There were no vitals taken for this visit.  General Appearance: Well nourished and well groomed and in no apparent distress.  Eyes: PERRLA, EOMs, conjunctiva no swelling or erythema, normal fundi and vessels. Sinuses: No frontal/maxillary tenderness ENT/Mouth: EACs patent / TMs  nl. Nares clear without erythema, swelling, mucoid exudates. Oral hygiene is good. No erythema, swelling, or exudate. Tongue normal, non-obstructing. Tonsils not swollen or erythematous. Hearing  normal.  Neck: Supple, thyroid not palpable. No bruits, nodes or JVD. Respiratory: Respiratory effort normal.  BS equal and clear bilateral without rales, rhonci, wheezing or stridor. Cardio: Heart sounds are normal with regular rate and rhythm and no murmurs, rubs or gallops. Peripheral pulses are normal and equal bilaterally without edema. No aortic or femoral bruits. Chest: symmetric with normal excursions and percussion.  Abdomen: Soft, with Nl bowel sounds. Nontender, no guarding, rebound, hernias, masses, or organomegaly.  Lymphatics: Non tender without lymphadenopathy.  Musculoskeletal: Full ROM all peripheral extremities, joint stability, 5/5 strength, and normal gait. Skin: Warm and dry without rashes, lesions, cyanosis, clubbing or  ecchymosis.  Neuro: Cranial nerves intact, reflexes equal bilaterally. Normal muscle tone, no cerebellar symptoms. Sensation intact.  Pysch: Alert and oriented X 3 with normal affect, insight and judgment appropriate.   Assessment and Plan  1. Annual Preventative/Screening Exam   1. Encounter for general adult medical examination with abnormal findings   2. Essential hypertension  - EKG 12-Lead - [place], RETROPERITNL ABD,  LTD - Urinalysis, Routine w reflex microscopic - Microalbumin / creatinine urine ratio - CBC with Differential/Platelet - COMPLETE METABOLIC PANEL WITH GFR - Magnesium - TSH  3. Hyperlipidemia, mixed  - EKG 12-Lead - [place], RETROPERITNL ABD,  LTD - Lipid panel - TSH  4. Abnormal glucose  - EKG 12-Lead -  [place], RETROPERITNL ABD,  LTD - Hemoglobin A1c - Insulin, random  5. Vitamin D deficiency  - VITAMIN D 25 Hydroxy   6. Thoracic aorta atherosclerosis (HCC) by CT scan  09/12/2018  - EKG 12-Lead - [place], RETROPERITNL ABD,  LTD - Lipid panel  7. Stage 3 chronic kidney disease, 3b CKD (HCC)  - PTH, intact and calcium - COMPLETE METABOLIC PANEL WITH GFR  8. BPH with obstruction/lower urinary tract symptoms  -  PSA  9. Screening for colorectal cancer  - POC Hemoccult Bld/Stl   10. History of prostate cancer  - PSA  11. Prostate cancer screening   12. Screening for heart disease  - EKG 12-Lead  13. FHx: heart disease  - EKG 12-Lead - [place], RETROPERITNL ABD,  LTD  14. Screening for AAA (aortic abdominal aneurysm)  - [place], RETROPERITNL ABD,  LTD  15. OSA on CPAP   16. Former smoker (quit 12/11/2018, 30+ pack year history)  - EKG 12-Lead - [place], RETROPERITNL ABD,  LTD  17. Medication management  - Urinalysis, Routine w reflex microscopic - Microalbumin / creatinine urine ratio - CBC with Differential/Platelet - COMPLETE METABOLIC PANEL WITH GFR - Magnesium - Lipid panel - TSH - Hemoglobin A1c - Insulin, random - VITAMIN D 25 Hydroxy           Patient was counseled in prudent diet, weight control to achieve/maintain BMI less than 25, BP monitoring, regular exercise and medications as discussed.  Discussed med effects and SE's. Routine screening labs and tests as requested with regular follow-up as recommended. Over 40 minutes of exam, counseling, chart review and high complex critical decision making was performed   Marinus Maw, MD

## 2022-05-31 ENCOUNTER — Encounter: Payer: Self-pay | Admitting: Internal Medicine

## 2022-05-31 ENCOUNTER — Ambulatory Visit (INDEPENDENT_AMBULATORY_CARE_PROVIDER_SITE_OTHER): Payer: Medicare HMO | Admitting: Internal Medicine

## 2022-05-31 VITALS — BP 120/70 | HR 57 | Temp 97.4°F | Resp 17 | Ht 72.0 in | Wt 174.6 lb

## 2022-05-31 DIAGNOSIS — I700 Atherosclerosis of aorta: Secondary | ICD-10-CM | POA: Diagnosis not present

## 2022-05-31 DIAGNOSIS — I10 Essential (primary) hypertension: Secondary | ICD-10-CM

## 2022-05-31 DIAGNOSIS — N138 Other obstructive and reflux uropathy: Secondary | ICD-10-CM

## 2022-05-31 DIAGNOSIS — N401 Enlarged prostate with lower urinary tract symptoms: Secondary | ICD-10-CM

## 2022-05-31 DIAGNOSIS — G4733 Obstructive sleep apnea (adult) (pediatric): Secondary | ICD-10-CM

## 2022-05-31 DIAGNOSIS — E559 Vitamin D deficiency, unspecified: Secondary | ICD-10-CM

## 2022-05-31 DIAGNOSIS — Z8249 Family history of ischemic heart disease and other diseases of the circulatory system: Secondary | ICD-10-CM

## 2022-05-31 DIAGNOSIS — Z1211 Encounter for screening for malignant neoplasm of colon: Secondary | ICD-10-CM

## 2022-05-31 DIAGNOSIS — Z136 Encounter for screening for cardiovascular disorders: Secondary | ICD-10-CM | POA: Diagnosis not present

## 2022-05-31 DIAGNOSIS — N1831 Chronic kidney disease, stage 3a: Secondary | ICD-10-CM

## 2022-05-31 DIAGNOSIS — Z87891 Personal history of nicotine dependence: Secondary | ICD-10-CM

## 2022-05-31 DIAGNOSIS — Z8546 Personal history of malignant neoplasm of prostate: Secondary | ICD-10-CM

## 2022-05-31 DIAGNOSIS — K219 Gastro-esophageal reflux disease without esophagitis: Secondary | ICD-10-CM

## 2022-05-31 DIAGNOSIS — Z0001 Encounter for general adult medical examination with abnormal findings: Secondary | ICD-10-CM

## 2022-05-31 DIAGNOSIS — Z79899 Other long term (current) drug therapy: Secondary | ICD-10-CM

## 2022-05-31 DIAGNOSIS — G250 Essential tremor: Secondary | ICD-10-CM

## 2022-05-31 DIAGNOSIS — R7309 Other abnormal glucose: Secondary | ICD-10-CM

## 2022-05-31 DIAGNOSIS — E782 Mixed hyperlipidemia: Secondary | ICD-10-CM

## 2022-06-01 LAB — CBC WITH DIFFERENTIAL/PLATELET
Absolute Monocytes: 472 cells/uL (ref 200–950)
Basophils Absolute: 29 cells/uL (ref 0–200)
Basophils Relative: 0.7 %
Eosinophils Absolute: 41 cells/uL (ref 15–500)
Eosinophils Relative: 1 %
HCT: 44.6 % (ref 38.5–50.0)
Hemoglobin: 15.5 g/dL (ref 13.2–17.1)
Lymphs Abs: 1148 cells/uL (ref 850–3900)
MCH: 36.6 pg — ABNORMAL HIGH (ref 27.0–33.0)
MCHC: 34.8 g/dL (ref 32.0–36.0)
MCV: 105.4 fL — ABNORMAL HIGH (ref 80.0–100.0)
MPV: 10.1 fL (ref 7.5–12.5)
Monocytes Relative: 11.5 %
Neutro Abs: 2411 cells/uL (ref 1500–7800)
Neutrophils Relative %: 58.8 %
Platelets: 162 10*3/uL (ref 140–400)
RBC: 4.23 10*6/uL (ref 4.20–5.80)
RDW: 11.7 % (ref 11.0–15.0)
Total Lymphocyte: 28 %
WBC: 4.1 10*3/uL (ref 3.8–10.8)

## 2022-06-01 LAB — HEMOGLOBIN A1C
Hgb A1c MFr Bld: 5.3 % of total Hgb (ref <5.7)
Mean Plasma Glucose: 105 mg/dL
eAG (mmol/L): 5.8 mmol/L

## 2022-06-01 LAB — PSA: PSA: <0.04 ng/mL (ref <=4.00)

## 2022-06-01 LAB — COMPLETE METABOLIC PANEL WITH GFR
AG Ratio: 2.3 (calc) (ref 1.0–2.5)
ALT: 11 U/L (ref 9–46)
AST: 27 U/L (ref 10–35)
Albumin: 3.9 g/dL (ref 3.6–5.1)
Alkaline phosphatase (APISO): 35 U/L (ref 35–144)
BUN: 7 mg/dL (ref 7–25)
CO2: 29 mmol/L (ref 20–32)
Calcium: 9.2 mg/dL (ref 8.6–10.3)
Chloride: 106 mmol/L (ref 98–110)
Creat: 1.25 mg/dL (ref 0.70–1.28)
Globulin: 1.7 g/dL (calc) — ABNORMAL LOW (ref 1.9–3.7)
Glucose, Bld: 115 mg/dL — ABNORMAL HIGH (ref 65–99)
Potassium: 3.9 mmol/L (ref 3.5–5.3)
Sodium: 143 mmol/L (ref 135–146)
Total Bilirubin: 0.8 mg/dL (ref 0.2–1.2)
Total Protein: 5.6 g/dL — ABNORMAL LOW (ref 6.1–8.1)
eGFR: 60 mL/min/{1.73_m2} (ref >=60)

## 2022-06-01 LAB — URINALYSIS, ROUTINE W REFLEX MICROSCOPIC
Bilirubin Urine: NEGATIVE
Glucose, UA: NEGATIVE
Hgb urine dipstick: NEGATIVE
Ketones, ur: NEGATIVE
Leukocytes,Ua: NEGATIVE
Nitrite: NEGATIVE
Protein, ur: NEGATIVE
Specific Gravity, Urine: 1.004 (ref 1.001–1.035)
pH: 7 (ref 5.0–8.0)

## 2022-06-01 LAB — LIPID PANEL
Cholesterol: 137 mg/dL (ref <200)
HDL: 56 mg/dL (ref >=40)
LDL Cholesterol (Calc): 58 mg/dL (calc)
Non-HDL Cholesterol (Calc): 81 mg/dL (calc) (ref <130)
Total CHOL/HDL Ratio: 2.4 (calc) (ref <5.0)
Triglycerides: 150 mg/dL — ABNORMAL HIGH (ref <150)

## 2022-06-01 LAB — MAGNESIUM: Magnesium: 1.7 mg/dL (ref 1.5–2.5)

## 2022-06-01 LAB — MICROALBUMIN / CREATININE URINE RATIO
Creatinine, Urine: 27 mg/dL (ref 20–320)
Microalb, Ur: <0.2 mg/dL

## 2022-06-01 LAB — PTH, INTACT AND CALCIUM
Calcium: 9.2 mg/dL (ref 8.6–10.3)
PTH: 32 pg/mL (ref 16–77)

## 2022-06-01 LAB — VITAMIN D 25 HYDROXY (VIT D DEFICIENCY, FRACTURES): Vit D, 25-Hydroxy: 132 ng/mL — ABNORMAL HIGH (ref 30–100)

## 2022-06-01 LAB — INSULIN, RANDOM: Insulin: 14.9 u[IU]/mL

## 2022-06-01 LAB — TSH: TSH: 3.29 mIU/L (ref 0.40–4.50)

## 2022-06-01 NOTE — Progress Notes (Signed)
^<^<^<^<^<^<^<^<^<^<^<^<^<^<^<^<^<^<^<^<^<^<^<^<^<^<^<^<^<^<^<^<^<^<^<^<^ ^>^>^>^>^>^>^>^>^>^>^>>^>^>^>^>^>^>^>^>^>^>^>^>^>^>^>^>^>^>^>^>^>^>^>^>^ -Test results slightly outside the reference range are not unusual. If there is anything important, I will review this with you,  otherwise it is considered normal test values.  If you have further questions,  please do not hesitate to contact me at the office or via My Chart.  ^<^<^<^<^<^<^<^<^<^<^<^<^<^<^<^<^<^<^<^<^<^<^<^<^<^<^<^<^<^<^<^<^<^<^<^<^ ^>^>^>^>^>^>^>^>^>^>^>^>^>^>^>^>^>^>^>^>^>^>^>^>^>^>^>^>^>^>^>^>^>^>^>^>^  -  CBC shows Red cells  ( MCV) are Very Large &                                     this is a sign of Alcoholic Liver Diease beginning                                     to Progress into Alcoholic Cirrhosis Liver Disease,                                      So it's very important to STOP DRINKING ALL ALCOHOL  !  - That's ALL types of Alcohol - Whiskey ,  Beer  & Wine                                                                                          - You can't Fool your Liver !  ^>^>^>^>^>^>^>^>^>^>^>^>^>^>^>^>^>^>^>^>^>^>^>^>^>^>^>^>^>^>^>^>^>^>^>^>^ ^>^>^>^>^>^>^>^>^>^>^>^>^>^>^>^>^>^>^>^>^>^>^>^>^>^>^>^>^>^>^>^>^>^>^>^>^  -   Chol = 137   & LDL Chol = 58  - Both   are  Excellent   - Very low risk for Heart Attack  / Stroke ^>^>^>^>^>^>^>^>^>^>^>^>^>^>^>^>^>^>^>^>^>^>^>^>^>^>^>^>^>^>^>^>^>^>^>^>^  -   Vitamin D = 132 is too high  ( Max level is 115  ) - Vitamin D goal is between 70-100.   - Please DECREASE your Vitamin D from                                                                     15.000 units down to only 10,000 units /day !   - It is very important as a natural anti-inflammatory and helping                               the immune system protect against viral infections, like the Covid-19    helping hair, skin, and nails, as well as reducing stroke and heart attack risk.   - It helps your  bones and helps with mood.  - It also decreases numerous cancer risks so please  take it as directed.   - Low Vit D is associated with a 200-300% higher risk for  CANCER   and 200-300% higher risk for HEART   ATTACK  &  STROKE.    - It is  also associated with higher death rate at younger ages,   autoimmune diseases like Rheumatoid arthritis, Lupus, Multiple Sclerosis.     - Also many other serious conditions, like depression,                                         Alzheimer's  Dementia, , muscle aches, fatigue, fibromyalgia   ^>^>^>^>^>^>^>^>^>^>^>^>^>^>^>^>^>^>^>^>^>^>^>^>^>^>^>^>^>^>^>^>^>^>^>^>^ ^>^>^>^>^>^>^>^>^>^>^>^>^>^>^>^>^>^>^>^>^>^>^>^>^>^>^>^>^>^>^>^>^>^>^>^>^  -    PSA - Undetectable  - Great  !   No sign of Prostate Cancer   !  ^>^>^>^>^>^>^>^>^>^>^>^>^>^>^>^>^>^>^>^>^>^>^>^>^>^>^>^>^>^>^>^>^>^>^>^>^  -    Magnesium  = 1.7  is  very  low    - goal is betw 2.0 - 2.5,   - So.......Marland Kitchen  Recommend that you take Magnesium 500 mg tablet 2 x /day with Meals   - also important to eat lots of  leafy green vegetables   - spinach - Kale - collards - greens - okra - asparagus - broccoli - quinoa - squash - almonds   - black, red, white beans -  peas - green beans  ^>^>^>^>^>^>^>^>^>^>^>^>^>^>^>^>^>^>^>^>^>^>^>^>^>^>^>^>^>^>^>^>^>^>^>^>^ ^>^>^>^>^>^>^>^>^>^>^>^>^>^>^>^>^>^>^>^>^>^>^>^>^>^>^>^>^>^>^>^>^>^>^>^>^  - A1c - Normal  - No Diabetes  -   Great      ^>^>^>^>^>^>^>^>^>^>^>^>^>^>^>^>^>^>^>^>^>^>^>^>^>^>^>^>^>^>^>^>^>^>^>^>^ ^>^>^>^>^>^>^>^>^>^>^>^>^>^>^>^>^>^>^>^>^>^>^>^>^>^>^>^>^>^>^>^>^>^>^>^>^  -   All Else - CBC - Kidneys - Electrolytes  & Thyroid    - all  Normal / OK  ^>^>^>^>^>^>^>^>^>^>^>^>^>^>^>^>^>^>^>^>^>^>^>^>^>^>^>^>^>^>^>^>^>^>^>^>^ ^>^>^>^>^>^>^>^>^>^>^>^>^>^>^>^>^>^>^>^>^>^>^>^>^>^>^>^>^>^>^>^>^>^>^>^>^

## 2022-06-10 ENCOUNTER — Other Ambulatory Visit (HOSPITAL_COMMUNITY): Payer: Self-pay

## 2022-06-24 ENCOUNTER — Other Ambulatory Visit: Payer: Self-pay | Admitting: Nurse Practitioner

## 2022-06-24 ENCOUNTER — Other Ambulatory Visit (HOSPITAL_COMMUNITY): Payer: Self-pay

## 2022-06-24 DIAGNOSIS — E782 Mixed hyperlipidemia: Secondary | ICD-10-CM

## 2022-06-24 MED ORDER — FENOFIBRATE MICRONIZED 134 MG PO CAPS
134.0000 mg | ORAL_CAPSULE | Freq: Every day | ORAL | 3 refills | Status: DC
  Filled 2022-06-24: qty 90, 90d supply, fill #0
  Filled 2022-06-24: qty 60, 60d supply, fill #0
  Filled 2022-09-01: qty 60, 60d supply, fill #1
  Filled 2022-11-01: qty 60, 60d supply, fill #2
  Filled 2023-01-06: qty 60, 60d supply, fill #3
  Filled 2023-04-03: qty 60, 60d supply, fill #4
  Filled 2023-06-06: qty 60, 60d supply, fill #5

## 2022-06-27 ENCOUNTER — Other Ambulatory Visit (HOSPITAL_COMMUNITY): Payer: Self-pay

## 2022-08-08 ENCOUNTER — Other Ambulatory Visit: Payer: Self-pay | Admitting: Nurse Practitioner

## 2022-08-08 ENCOUNTER — Other Ambulatory Visit (HOSPITAL_COMMUNITY): Payer: Self-pay

## 2022-08-08 MED ORDER — EZETIMIBE 10 MG PO TABS
10.0000 mg | ORAL_TABLET | Freq: Every day | ORAL | 3 refills | Status: DC
  Filled 2022-08-08: qty 90, 90d supply, fill #0
  Filled 2022-11-16: qty 90, 90d supply, fill #1
  Filled 2023-03-20: qty 90, 90d supply, fill #2
  Filled 2023-07-13: qty 90, 90d supply, fill #3

## 2022-08-29 ENCOUNTER — Other Ambulatory Visit (HOSPITAL_COMMUNITY): Payer: Self-pay

## 2022-08-30 NOTE — Progress Notes (Unsigned)
MEDICARE ANNUAL WELLNESS VISIT AND FOLLOW UP Assessment:   Juan Torres was seen today for medicare wellness and follow-up.  Diagnoses and all orders for this visit:  Annual Medicare Wellness Visit Due annually  Health maintenance reviewed Healthily lifestyle goals set  Atherosclerosis of aorta (HCC) Per CT 2019 Control blood pressure, cholesterol, glucose, increase exercise.   Centrilobular emphysema (HCC) Mild by imaging; patient denies sx;  Smoking cessation completed  Essential hypertension Elevated in clinic - asymptomatic - mostly likely secondary to EtOH intake. Start Clonidine as directed. Discussed DASH (Dietary Approaches to Stop Hypertension) DASH diet is lower in sodium than a typical American diet. Cut back on foods that are high in saturated fat, cholesterol, and trans fats. Eat more whole-grain foods, fish, poultry, and nuts Remain active and exercise as tolerated daily.  Monitor BP at home-Call if greater than 130/80.  Check CMP/CBC  Mixed hyperlipidemia Discussed lifestyle modifications. Recommended diet heavy in fruits and veggies, omega 3's. Decrease consumption of animal meats, cheeses, and dairy products. Remain active and exercise as tolerated. Continue to monitor. Check lipids/TSH  Other abnormal glucose Recent A1c at goal. Education: Reviewed 'ABCs' of diabetes management  Discussed goals to be met and/or maintained include A1C (<7) Blood pressure (<130/80) Cholesterol (LDL <70) Continue Eye Exam yearly  Continue Dental Exam Q6 mo Discussed dietary recommendations Discussed Physical Activity recommendations Foot exam UTD Check A1C  Vitamin D deficiency Elevated last OV Discussed best supplement dosage for goal of 60-100 Monitor Vitamin D levels  Gastroesophageal reflux disease, esophagitis presence not specified No suspected reflux complications (Barret/stricture). Lifestyle modification:  wt loss, avoid meals 2-3h before bedtime. Consider  eliminating food triggers:  chocolate, caffeine, EtOH, acid/spicy food.  Hx of prostate cancer PSA review stable, last undetectable Was released by urology  No urinary symptoms, continue monitoring  CKD III (HCC) Discussed how what you eat and drink can aide in kidney protection. Stay well hydrated. Avoid high salt foods. Avoid NSAIDS. Keep BP and BG well controlled.   Take medications as prescribed. Remain active and exercise as tolerated daily. Maintain weight.  Continue to monitor. Check CMP/GFR/Microablumin  Former smoker - 30 pack year, quit 2020 Low-dose screening CT annually - ordered to schedule  History of skin cancer Continue close follow up by dermatology  Familial tremor Continue propranolol, avoid excess alcohol, monitor   Excess alcohol intake Limit to no more than 1-2 drinks/day patient has done well reducing intake  OSA on CPAP Mild per sleep study 2020 by Dr. Vickey Huger On CPAP, reduce alcohol  Medication management All medications discussed and reviewed in full. All questions and concerns regarding medications addressed.    Orders Placed This Encounter  Procedures   CT CHEST LUNG CA SCREEN LOW DOSE W/O CM    Standing Status:   Future    Standing Expiration Date:   08/31/2023    Order Specific Question:   Preferred Imaging Location?    Answer:   [id number]   CBC with Differential/Platelet   COMPLETE METABOLIC PANEL WITH GFR   Lipid panel   VITAMIN D 25 Hydroxy (Vit-D Deficiency, Fractures)   Meds ordered this encounter  Medications   cloNIDine (CATAPRES) 0.2 MG tablet    Sig: Take 1 tablet (0.2 mg total) by mouth daily as needed. For BP >160/90    Dispense:  60 tablet    Refill:  2    Order Specific Question:   Supervising Provider    Answer:   Lucky Cowboy [phone]  Notify office for further evaluation and treatment, questions or concerns if any reported s/s fail to improve.   The patient was advised to call back or seek an  in-person evaluation if any symptoms worsen or if the condition fails to improve as anticipated.   Further disposition pending results of labs. Discussed med's effects and SE's.    I discussed the assessment and treatment plan with the patient. The patient was provided an opportunity to ask questions and all were answered. The patient agreed with the plan and demonstrated an understanding of the instructions.  Discussed med's effects and SE's. Screening labs and tests as requested with regular follow-up as recommended.  I provided 35 minutes of face-to-face time during this encounter including counseling, chart review, and critical decision making was preformed.    Future Appointments  Date Time Provider Department Center  12/01/2022 10:30 AM Lucky Cowboy, MD [facility] None  06/09/2023 10:00 AM Lucky Cowboy, MD [facility] None  09/01/2023 11:00 AM Adela Glimpse, NP [facility] None     Plan:   During the course of the visit the patient was educated and counseled about appropriate screening and preventive services including:   Pneumococcal vaccine  Influenza vaccine Prevnar 13 Td vaccine Screening electrocardiogram Colorectal cancer screening Diabetes screening Glaucoma screening Nutrition counseling    Subjective:  Juan Torres is a 77 y.o. male who presents for Medicare Annual Wellness Visit and 3 month follow up. He has History of prostate cancer; GERD (gastroesophageal reflux disease); Hyperlipidemia, mixed; Essential hypertension; Vitamin D deficiency; Medication management; BMI 24.0-24.9, adult; Former smoker (quit 12/11/2018, 30+ pack year history); CKD (chronic kidney disease) stage 3, GFR 30-59 ml/min (HCC); Abnormal glucose; Thoracic aorta atherosclerosis (HCC) by CT scan  09/12/2018; Emphysema lung (HCC); OSA on CPAP; Excessive drinking of alcohol; Other secondary hypertension; History of skin cancer; Familial tremor; and Hepatic steatosis on their problem  list.  Overall he has no new concerns in clinic today.  He is very tearful.  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    Continues to enjoy riding his harley motorcycle.  He was diagnosed with mild OSA in 2020, reports wearing CPAP nightly, endorses 100% compliance with restorative sleep.   He had tremor, was taking valium but stopped after he quit excess alcohol (was drinking a fifth daily, now down to occasional glass of red wine). Doing well with propanolol.    he quit smoking Nov 2020; COPD changes per imaging (CT 01/09/2020). Denies sx. Has 30+ pack/year smoking hx, is undergoing annual low dose lung CT screenings. Last in 01/09/2020 with stable/unchanged findings.   he has a diagnosis of GERD which is currently managed by OTC omeprazole daily. Denies breakthrough sx.   BMI is Body mass index is 22.08 kg/m., he has been working on diet and exercise.  Wt Readings from Last 3 Encounters:  08/31/22 162 lb 12.8 oz (73.8 kg)  05/31/22 174 lb 9.6 oz (79.2 kg)  01/25/22 171 lb (77.6 kg)   His blood pressure has been controlled at home, today their BP is BP: (!) 172/96  Feels this is due to crying and emotional as well as EtOH intake.  He reports that that he checks his BP in the home and is usually 150's.  He does workout. He denies chest pain, shortness of breath, dizziness.  He has aortic atherosclerosis per CT 12/2018   He is on cholesterol medication (rosuvastatin 40 mg daily, fenofibrate 134 mg daily, fish oil) and denies myalgias.  His LDL cholesterol is at goal, trigs remain elevated. The cholesterol last visit was:   Lab Results  Component Value Date   CHOL 137 05/31/2022   HDL 56 05/31/2022   LDLCALC 58 05/31/2022   TRIG 150 (H) 05/31/2022   CHOLHDL 2.4 05/31/2022    He has been working on diet and exercise for glucose management, and denies hyperglycemia, hypoglycemia , nausea, paresthesia of the feet, polydipsia and polyuria. Last A1C in the office was:   Lab Results  Component Value Date   HGBA1C 5.3 05/31/2022   He has CKD III followed our office, has seen Dr. Hyman Hopes in the past. Last GFR was:  Lab Results  Component Value Date   GFRNONAA 45 (L) 05/20/2020   GFRNONAA 36 (L) 01/30/2020   GFRNONAA 43 (L) 10/30/2019   Patient is on Vitamin D supplement (taking 27253 IU daily):  Lab Results  Component Value Date   VD25OH 132 (H) 05/31/2022     Patient was treated in 2014 for Prostate Ca with Brachytherapy, was released by urology.  Lab Results  Component Value Date   PSA <0.04 05/31/2022   PSA <0.04 05/25/2021   PSA <0.04 05/20/2020    Medication Review: Current Outpatient Medications on File Prior to Visit  Medication Sig Dispense Refill   Cholecalciferol (VITAMIN D3) 125 MCG (5000 UT) TABS Take 15,000 Units by mouth every morning.     ezetimibe (ZETIA) 10 MG tablet Take 1 tablet (10 mg total) by mouth daily for cholesterol. 90 tablet 3   famotidine (PEPCID) 20 MG tablet Take 1 tablet (20 mg total) by mouth 2 (two) times daily; before breakfast and before dinner 180 tablet 0   fenofibrate micronized (LOFIBRA) 134 MG capsule Take 1 capsule (134 mg total) by mouth daily (blood fats). 90 capsule 3   Magnesium 400 MG CAPS Take 400 mg by mouth every evening.      Omega-3 Fatty Acids (FISH OIL) 1000 MG CAPS Take 1,000 mg by mouth every morning.     omeprazole (PRILOSEC) 40 MG capsule Take 1 capsule (40 mg total) by mouth daily to prevent indigestion & heartburn 90 capsule 3   propranolol ER (INDERAL LA) 120 MG 24 hr capsule Take 1 capsule (120 mg total) by mouth daily. For blood pressure and tremor 90 capsule 3   rosuvastatin (CRESTOR) 40 MG tablet Take 1 tablet (40 mg total) by mouth daily for cholesterol 90 tablet 3   B Complex Vitamins (VITAMIN B-COMPLEX PO) Take 1 tablet by mouth daily. (Patient not taking: Reported on 08/31/2022)     No current facility-administered medications on file prior to visit.    Allergies: Allergies   Allergen Reactions   Lipitor [Atorvastatin] Other (See Comments)    fatigued   Niacin And Related Other (See Comments)    Flushing   Penicillins Rash    Current Problems (verified) has History of prostate cancer; GERD (gastroesophageal reflux disease); Hyperlipidemia, mixed; Essential hypertension; Vitamin D deficiency; Medication management; BMI 24.0-24.9, adult; Former smoker (quit 12/11/2018, 30+ pack year history); CKD (chronic kidney disease) stage 3, GFR 30-59 ml/min (HCC); Abnormal glucose; Thoracic aorta atherosclerosis (HCC) by CT scan  09/12/2018; Emphysema lung (HCC); OSA on CPAP; Excessive drinking of alcohol; Other secondary hypertension; History of skin cancer; Familial tremor; and Hepatic steatosis on their problem list.  Screening Tests Immunization History  Administered Date(s) Administered   DT (Pediatric) 10/20/2011   PFIZER(Purple Top)SARS-COV-2 Vaccination 04/04/2019, 04/30/2019   Pneumococcal Conjugate-13 11/14/2017   Pneumococcal Polysaccharide-23 04/28/2016  Zoster, Live 10/20/2011   Preventative care: Last colonoscopy: 12/02/2011 due - patient plans to schedule - order placed Chest CT screen: 01/09/2020 - benign nodules, aortic atherosclerosis, emphysema , due  Prior vaccinations: TD or Tdap: 2013  Influenza: DUE declines  Pneumococcal 23: 04/28/2016 Prevnar13: 11/2017 Shingles/Zostavax: 10/20/2011 Covid 19: 2/2, 2021, pfizer  Names of Other Physician/Practitioners you currently use: 1. [facility] here for primary care 2. Dr. Dione Booze, eye doctor, last visit  2023,  3. Dr. Sharma Covert, dentist, last visit 2023, goes q39m 4. [facility], last visit 2023, goes annually, has upcoming appointment   Patient Care Team: Lucky Cowboy, MD as PCP - General (Internal Medicine) Chipper Herb, MD (Inactive) as Consulting Physician (Radiation Oncology) Ernesto Rutherford, MD as Consulting Physician (Ophthalmology) Elvis Coil, MD as Consulting Physician (Nephrology)  Surgical: He  has a past surgical history that includes Prostate biopsy (12/19/2011); Colonoscopy w/ polypectomy; Radioactive seed implant (N/A, 04/04/2012); Cystoscopy (N/A, 04/04/2012); Eye surgery; and Inguinal hernia repair (Bilateral, 10/18/2018). Family His family history includes Breast cancer in his mother; Cancer in his maternal grandfather; Dementia in his father; Heart attack (age of onset: 28) in his father; Prostate cancer in his cousin and maternal uncle; Ulcers in his maternal grandmother. Social history  He reports that he quit smoking about 3 years ago. His smoking use included cigarettes. He started smoking about 33 years ago. He has a 30 pack-year smoking history. He has never used smokeless tobacco. He reports that he does not currently use alcohol after a past usage of about 8.0 standard drinks of alcohol per week. He reports current drug use. Frequency: 7.00 times per week. Drug: Marijuana.  MEDICARE WELLNESS OBJECTIVES: Physical activity: Current Exercise Habits: The patient does not participate in regular exercise at present Cardiac risk factors:   Depression/mood screen:      08/31/2022    1:16 PM  Depression screen PHQ 2/9  Decreased Interest 0  Down, Depressed, Hopeless 1  PHQ - 2 Score 1    ADLs:     08/31/2022    1:16 PM 05/31/2022    8:54 PM  In your present state of health, do you have any difficulty performing the following activities:  Hearing? 0 0  Vision? 0 0  Difficulty concentrating or making decisions? 0 0  Walking or climbing stairs? 0 0  Dressing or bathing? 0 0  Doing errands, shopping? 0 0     Cognitive Testing  Alert? Yes  Normal Appearance?Yes  Oriented to person? Yes  Place? Yes   Time? Yes  Recall of three objects?  Yes  Can perform simple calculations? Yes  Displays appropriate judgment?Yes  Can read the correct time from a watch face?Yes  EOL planning:     Review of Systems   Constitutional:  Negative for malaise/fatigue and weight loss.  HENT:  Negative for hearing loss and tinnitus.   Eyes:  Negative for blurred vision and double vision.  Respiratory:  Negative for cough, sputum production, shortness of breath and wheezing.   Cardiovascular:  Negative for chest pain, palpitations, orthopnea, claudication, leg swelling and PND.  Gastrointestinal:  Negative for abdominal pain, blood in stool, constipation, diarrhea, heartburn, melena, nausea and vomiting.  Genitourinary: Negative.   Musculoskeletal:  Negative for falls, joint pain and myalgias.  Skin:  Negative for rash.  Neurological:  Negative for dizziness, tingling, tremors (improved), sensory change, weakness and headaches.  Endo/Heme/Allergies:  Negative for polydipsia.  Psychiatric/Behavioral: Negative.  Negative for depression, memory  loss, substance abuse and suicidal ideas. The patient is not nervous/anxious and does not have insomnia.   All other systems reviewed and are negative.    Objective:   Today's Vitals   08/31/22 1049  BP: (!) 172/96  Pulse: 60  Temp: (!) 97.4 F (36.3 C)  SpO2: 99%  Weight: 162 lb 12.8 oz (73.8 kg)  Height: 6' (1.829 m)    Body mass index is 22.08 kg/m.  General appearance: alert, red through face/neck , no distress, WD/WN, male HEENT: normocephalic, sclerae anicteric, TMs pearly, nares patent, no discharge or erythema, pharynx normal, normal hearing with bilateral hearing aids Oral cavity: MMM, no lesions Neck: supple, no lymphadenopathy, no thyromegaly, no masses Heart: RRR, normal S1, S2, no murmurs Lungs: Symmetrical, clear, not diminished, no wheezes, rhonchi, or rales Abdomen: +bs, soft,  non distended,  No palpable organomegaly or hernias Musculoskeletal: No deformity, no swelling Extremities: no edema, no cyanosis, no clubbing Pulses: 2+ symmetric, upper and lower extremities, normal cap refill Neurological: alert, oriented x 3, CN2-12 intact,  strength normal upper extremities and lower extremities, sensation normal throughout, DTRs 2+ throughout, no cerebellar signs, gait normal. No tremor.  Psychiatric: normal affect, behavior normal, pleasant   Medicare Attestation I have personally reviewed: The patient's medical and social history Their use of alcohol, tobacco or illicit drugs Their current medications and supplements The patient's functional ability including ADLs,fall risks, home safety risks, cognitive, and hearing and visual impairment Diet and physical activities Evidence for depression or mood disorders  The patient's weight, height, BMI, and visual acuity have been recorded in the chart.  I have made referrals, counseling, and provided education to the patient based on review of the above and I have provided the patient with a written personalized care plan for preventive services.     Adela Glimpse, NP   08/31/2022

## 2022-08-31 ENCOUNTER — Other Ambulatory Visit (HOSPITAL_COMMUNITY): Payer: Self-pay

## 2022-08-31 ENCOUNTER — Encounter: Payer: Self-pay | Admitting: Nurse Practitioner

## 2022-08-31 ENCOUNTER — Ambulatory Visit (INDEPENDENT_AMBULATORY_CARE_PROVIDER_SITE_OTHER): Payer: Medicare HMO | Admitting: Nurse Practitioner

## 2022-08-31 VITALS — BP 172/96 | HR 60 | Temp 97.4°F | Ht 72.0 in | Wt 162.8 lb

## 2022-08-31 DIAGNOSIS — Z0001 Encounter for general adult medical examination with abnormal findings: Secondary | ICD-10-CM

## 2022-08-31 DIAGNOSIS — Z79899 Other long term (current) drug therapy: Secondary | ICD-10-CM

## 2022-08-31 DIAGNOSIS — J432 Centrilobular emphysema: Secondary | ICD-10-CM

## 2022-08-31 DIAGNOSIS — Z0000 Encounter for general adult medical examination without abnormal findings: Secondary | ICD-10-CM

## 2022-08-31 DIAGNOSIS — K219 Gastro-esophageal reflux disease without esophagitis: Secondary | ICD-10-CM

## 2022-08-31 DIAGNOSIS — F1010 Alcohol abuse, uncomplicated: Secondary | ICD-10-CM | POA: Diagnosis not present

## 2022-08-31 DIAGNOSIS — E782 Mixed hyperlipidemia: Secondary | ICD-10-CM | POA: Diagnosis not present

## 2022-08-31 DIAGNOSIS — Z8546 Personal history of malignant neoplasm of prostate: Secondary | ICD-10-CM

## 2022-08-31 DIAGNOSIS — R6889 Other general symptoms and signs: Secondary | ICD-10-CM | POA: Diagnosis not present

## 2022-08-31 DIAGNOSIS — I10 Essential (primary) hypertension: Secondary | ICD-10-CM

## 2022-08-31 DIAGNOSIS — G250 Essential tremor: Secondary | ICD-10-CM

## 2022-08-31 DIAGNOSIS — Z87891 Personal history of nicotine dependence: Secondary | ICD-10-CM

## 2022-08-31 DIAGNOSIS — N1832 Chronic kidney disease, stage 3b: Secondary | ICD-10-CM

## 2022-08-31 DIAGNOSIS — R918 Other nonspecific abnormal finding of lung field: Secondary | ICD-10-CM

## 2022-08-31 DIAGNOSIS — Z85828 Personal history of other malignant neoplasm of skin: Secondary | ICD-10-CM

## 2022-08-31 DIAGNOSIS — G4733 Obstructive sleep apnea (adult) (pediatric): Secondary | ICD-10-CM

## 2022-08-31 DIAGNOSIS — I700 Atherosclerosis of aorta: Secondary | ICD-10-CM

## 2022-08-31 DIAGNOSIS — E559 Vitamin D deficiency, unspecified: Secondary | ICD-10-CM

## 2022-08-31 DIAGNOSIS — R7309 Other abnormal glucose: Secondary | ICD-10-CM | POA: Diagnosis not present

## 2022-08-31 LAB — CBC WITH DIFFERENTIAL/PLATELET
Absolute Monocytes: 602 cells/uL (ref 200–950)
Eosinophils Relative: 0.5 %
HCT: 49.8 % (ref 38.5–50.0)
Hemoglobin: 17.3 g/dL — ABNORMAL HIGH (ref 13.2–17.1)
Lymphs Abs: 1376 cells/uL (ref 850–3900)
MCHC: 34.7 g/dL (ref 32.0–36.0)
MCV: 104.4 fL — ABNORMAL HIGH (ref 80.0–100.0)
MPV: 10.2 fL (ref 7.5–12.5)
Monocytes Relative: 9.4 %
Neutro Abs: 4358 cells/uL (ref 1500–7800)
Platelets: 176 10*3/uL (ref 140–400)
RDW: 12 % (ref 11.0–15.0)
Total Lymphocyte: 21.5 %
WBC: 6.4 10*3/uL (ref 3.8–10.8)

## 2022-08-31 MED ORDER — CLONIDINE HCL 0.2 MG PO TABS
0.2000 mg | ORAL_TABLET | Freq: Every day | ORAL | 2 refills | Status: DC | PRN
Start: 2022-08-31 — End: 2023-05-04
  Filled 2022-08-31: qty 60, 60d supply, fill #0
  Filled 2022-11-01: qty 60, 60d supply, fill #1
  Filled 2023-01-06: qty 60, 60d supply, fill #2

## 2022-08-31 NOTE — Patient Instructions (Signed)
Clonidine Tablets What is this medication? CLONIDINE (KLOE ni deen) treats high blood pressure. It works by relaxing blood vessels, which decreases blood pressure and the amount of work the heart has to do. This medicine may be used for other purposes; ask your health care provider or pharmacist if you have questions. COMMON BRAND NAME(S): Catapres What should I tell my care team before I take this medication? They need to know if you have any of these conditions: Kidney disease An unusual or allergic reaction to clonidine, other medications, foods, dyes, or preservatives Pregnant or trying to get pregnant Breast-feeding How should I use this medication? Take this medication by mouth. Take it as directed on the prescription label at the same time every day. You can take it with or without food. If it upsets your stomach, take it with food. Keep taking it unless your care team tells you to stop. Talk to your care team about the use of this medication in children. Special care may be needed. Overdosage: If you think you have taken too much of this medicine contact a poison control center or emergency room at once. NOTE: This medicine is only for you. Do not share this medicine with others. What if I miss a dose? If you miss a dose, take it as soon as you can. If it is almost time for your next dose, take only that dose. Do not take double or extra doses. What may interact with this medication? Do not take this medication with any of the following: MAOIs, such as Carbex, Eldepryl, Marplan, Nardil, and Parnate This medication may also interact with the following: Barbiturate medications for inducing sleep or treating seizures, such as phenobarbital Certain medications for blood pressure, heart disease, or irregular heart beat Certain medications for mental health conditions Prescription pain medications This list may not describe all possible interactions. Give your health care provider a list  of all the medicines, herbs, non-prescription drugs, or dietary supplements you use. Also tell them if you smoke, drink alcohol, or use illegal drugs. Some items may interact with your medicine. What should I watch for while using this medication? Visit your care team for regular checks on your progress. Check your heart rate and blood pressure as directed. Know what your heart rate and blood pressure should be, and when to contact your care team. This medication may affect your coordination, reaction time, or judgment. Do not drive or operate machinery until you know how this medication affects you. Sit up or stand slowly to reduce the risk of dizzy or fainting spells. Drinking alcohol with this medication can increase the risk of these side effects. Your mouth may get dry. Chewing sugarless gum or sucking hard candy and drinking plenty of water may help. Contact your care team if the problem doesn't go away or is severe. Do not treat yourself for coughs, colds, or pain while you are taking this medication without asking your care team for advice. Some medications may increase your blood pressure. If you are going to have surgery, tell your care team that you are taking this medication. What side effects may I notice from receiving this medication? Side effects that you should report to your care team as soon as possible: Allergic reactions--skin rash, itching, hives, swelling of the face, lips, tongue, or throat Low blood pressure--dizziness, feeling faint or lightheaded, blurry vision Slow heartbeat--dizziness, feeling faint or lightheaded, confusion, trouble breathing, unusual weakness or fatigue Side effects that usually do not require medical attention (  report to your care team if they continue or are bothersome): Constipation Dizziness Drowsiness Dry eyes Dry mouth Fatigue This list may not describe all possible side effects. Call your doctor for medical advice about side effects. You may  report side effects to FDA at 1-800-FDA-1088. Where should I keep my medication? Keep out of the reach of children and pets. Store at room temperature between 15 and 30 degrees C (59 and 86 degrees F). Throw away any unused medication after the expiration date. NOTE: This sheet is a summary. It may not cover all possible information. If you have questions about this medicine, talk to your doctor, pharmacist, or health care provider.  2024 Elsevier/Gold Standard (2021-08-25 00:00:00)

## 2022-09-01 LAB — COMPLETE METABOLIC PANEL WITH GFR
AG Ratio: 2.2 (calc) (ref 1.0–2.5)
ALT: 14 U/L (ref 9–46)
AST: 24 U/L (ref 10–35)
Albumin: 4.4 g/dL (ref 3.6–5.1)
Alkaline phosphatase (APISO): 33 U/L — ABNORMAL LOW (ref 35–144)
BUN/Creatinine Ratio: 7 (calc) (ref 6–22)
BUN: 11 mg/dL (ref 7–25)
CO2: 29 mmol/L (ref 20–32)
Calcium: 10.2 mg/dL (ref 8.6–10.3)
Chloride: 108 mmol/L (ref 98–110)
Globulin: 2 g/dL (calc) (ref 1.9–3.7)
Glucose, Bld: 102 mg/dL — ABNORMAL HIGH (ref 65–99)
Potassium: 5 mmol/L (ref 3.5–5.3)
Total Bilirubin: 0.8 mg/dL (ref 0.2–1.2)
Total Protein: 6.4 g/dL (ref 6.1–8.1)
eGFR: 48 mL/min/{1.73_m2} — ABNORMAL LOW (ref >=60)

## 2022-09-01 LAB — CBC WITH DIFFERENTIAL/PLATELET
Basophils Absolute: 32 cells/uL (ref 0–200)
Basophils Relative: 0.5 %
Eosinophils Absolute: 32 cells/uL (ref 15–500)
MCH: 36.3 pg — ABNORMAL HIGH (ref 27.0–33.0)
Neutrophils Relative %: 68.1 %
RBC: 4.77 10*6/uL (ref 4.20–5.80)

## 2022-09-01 LAB — LIPID PANEL
Cholesterol: 161 mg/dL (ref <200)
HDL: 69 mg/dL (ref >=40)
LDL Cholesterol (Calc): 76 mg/dL (calc)
Non-HDL Cholesterol (Calc): 92 mg/dL (calc) (ref <130)
Total CHOL/HDL Ratio: 2.3 (calc) (ref <5.0)
Triglycerides: 77 mg/dL (ref <150)

## 2022-09-01 LAB — VITAMIN D 25 HYDROXY (VIT D DEFICIENCY, FRACTURES): Vit D, 25-Hydroxy: 126 ng/mL — ABNORMAL HIGH (ref 30–100)

## 2022-12-01 ENCOUNTER — Encounter: Payer: Self-pay | Admitting: Internal Medicine

## 2022-12-01 ENCOUNTER — Ambulatory Visit: Payer: Medicare HMO | Admitting: Internal Medicine

## 2022-12-01 VITALS — BP 130/80 | HR 46 | Temp 97.9°F | Resp 16 | Ht 72.0 in | Wt 172.0 lb

## 2022-12-01 DIAGNOSIS — Z79899 Other long term (current) drug therapy: Secondary | ICD-10-CM

## 2022-12-01 DIAGNOSIS — E782 Mixed hyperlipidemia: Secondary | ICD-10-CM

## 2022-12-01 DIAGNOSIS — I10 Essential (primary) hypertension: Secondary | ICD-10-CM | POA: Diagnosis not present

## 2022-12-01 DIAGNOSIS — I700 Atherosclerosis of aorta: Secondary | ICD-10-CM

## 2022-12-01 DIAGNOSIS — R7309 Other abnormal glucose: Secondary | ICD-10-CM

## 2022-12-01 DIAGNOSIS — E559 Vitamin D deficiency, unspecified: Secondary | ICD-10-CM

## 2022-12-01 NOTE — Progress Notes (Signed)
Future Appointments  Date Time Provider Department  12/01/2022                     6 mo  10:30 AM Lucky Cowboy, MD [facility]  06/09/2023                     cpe 10:00 AM Lucky Cowboy, MD [facility]  09/01/2023 11:00 AM Adela Glimpse, NP [facility]    History of Present Illness:      This very nice 77 y.o. MWM   presents for 6 month follow up with HTN, HLD, Pre-Diabetes and Vitamin D Deficiency. Patient also has GERD controlled with his Omeprazole, hx/o  Prostate  Cancer treated in 2014 /Brachytherapy and   familial Tremor treated with Propranolol.  Chest CT scan in 2020 showed thoracic  Aortic Atherosclerosis .  Patient also has hx/o Alcoholism.       Patient is treated for HTN  (1998)   & BP has been controlled at home. Today's BP is at goal -  130/80.  Patient has CKD 3a (GFR 48). Patient has had no complaints of any cardiac type chest pain, palpitations, dyspnea Pollyann Kennedy /PND, dizziness, claudication or dependent edema.        Hyperlipidemia is controlled with diet &  Rosuvastatin, fenofibrate & ezetimibe. Patient denies myalgias or other med SE's. Last Lipids were at goal :  Lab Results  Component Value Date   CHOL 161 08/31/2022   HDL 69 08/31/2022   LDLCALC 76 08/31/2022   TRIG 77 08/31/2022   CHOLHDL 2.3 08/31/2022     Also, the patient has history of PreDiabetes  (A1c 5.7% /2014)  and has had no symptoms of reactive hypoglycemia, diabetic polys, paresthesias or visual blurring.  Last A1c was Normal & at goal :  Lab Results  Component Value Date   HGBA1C 5.3 05/31/2022                                                        Further, the patient also has history of Vitamin D Deficiency  ("33" /2016) and supplements vitamin D . Last vitamin D was elevated & dose was tapered :  Lab Results  Component Value Date   VD25OH 126 (H) 08/31/2022     Current Outpatient Medications on File Prior to Visit  Medication Sig   VITAMIN D 5000 u Take 10,000 Units   every morning.   cloNIDine (CATAPRES) 0.2 MG tablet Take 1 tablet  daily as needed   ezetimibe 10 MG tablet Take 1 tablet  daily    famotidine 20 MG tablet Take 1 tablet 2  times daily   fenofibrate 134 MG capsule Take 1 capsule  daily    Magnesium 400 MG CAPS Take  every evening.    Omega-3 FISH OIL  1000 MG CAPS Take  every morning.   omeprazole 40 MG capsule Take 1 capsule  daily    propranolol ER 120 MG 2 Take 1 capsule daily   rosuvastatin 40 MG tablet Take 1 tablet  daily f     Allergies  Allergen Reactions   Lipitor [Atorvastatin] Other (See Comments)    fatigued   Niacin And Related Other (See Comments)    Flushing   Penicillins Rash  PMHx:   Past Medical History:  Diagnosis Date   Adenomatous colon polyp    GERD (gastroesophageal reflux disease)    History of basal cell cancer 2012   Hyperlipidemia    Hypertension    Prediabetes    Prostate cancer (HCC) DX 12/19/11   bx=Adenocarcinoma,gleason=3+3=6,volume=42cc,PSA=4.63   Pulmonary nodules 12/10/2018   Numerous unchanged/benign appearing per CT lung 12/2018   Vitamin D deficiency      Immunization History  Administered Date(s) Administered   DT (Pediatric) 10/20/2011   PFIZER SARS-COV-2 Vacc 04/04/2019, 04/30/2019   Pneumococcal - 13 11/14/2017   Pneumococcal - 23 04/28/2016   Zoster, Live 10/20/2011     Past Surgical History:  Procedure Laterality Date   COLONOSCOPY W/ POLYPECTOMY     Adenomatous   CYSTOSCOPY N/A 04/04/2012   Procedure: CYSTOSCOPY;  Surgeon: Milford Cage, MD;  Location: [facility];  Service: Urology;  Laterality: N/A;   EYE SURGERY     bilateral cataracts    INGUINAL HERNIA REPAIR Bilateral 10/18/2018   Procedure: LAPAROSCOPIC BILATERAL INGUINAL HERNIA REPAIR;  Surgeon: Luretha Murphy, MD;  Location: [facility];  Service: General;  Laterality: Bilateral;   PROSTATE BIOPSY  12/19/2011   Procedure: BIOPSY TRANSRECTAL ULTRASONIC PROSTATE (TUBP);  Surgeon:  Milford Cage, MD;  Location: [facility];  Service: Urology;  Laterality: N/A;     RADIOACTIVE SEED IMPLANT N/A 04/04/2012   Procedure: RADIOACTIVE SEED IMPLANT;  Surgeon: Milford Cage, MD;  Location[facility];  Service: Urology;  Laterality: N/A;  Seeds Implanted     72  Seeds Found in Bladder     None      FHx:    Reviewed / unchanged   SHx:    Reviewed / unchanged    Systems Review:  Constitutional: Denies fever, chills, wt changes, headaches, insomnia, fatigue, night sweats, change in appetite. Eyes: Denies redness, blurred vision, diplopia, discharge, itchy, watery eyes.  ENT: Denies discharge, congestion, post nasal drip, epistaxis, sore throat, earache, hearing loss, dental pain, tinnitus, vertigo, sinus pain, snoring.  CV: Denies chest pain, palpitations, irregular heartbeat, syncope, dyspnea, diaphoresis, orthopnea, PND, claudication or edema. Respiratory: denies cough, dyspnea, DOE, pleurisy, hoarseness, laryngitis, wheezing.  Gastrointestinal: Denies dysphagia, odynophagia, heartburn, reflux, water brash, abdominal pain or cramps, nausea, vomiting, bloating, diarrhea, constipation, hematemesis, melena, hematochezia  or hemorrhoids. Genitourinary: Denies dysuria, frequency, urgency, nocturia, hesitancy, discharge, hematuria or flank pain. Musculoskeletal: Denies arthralgias, myalgias, stiffness, jt. swelling, pain, limping or strain/sprain.  Skin: Denies pruritus, rash, hives, warts, acne, eczema or change in skin lesion(s). Neuro: No weakness, tremor, incoordination, spasms, paresthesia or pain. Psychiatric: Denies confusion, memory loss or sensory loss. Endo: Denies change in weight, skin or hair change.  Heme/Lymph: No excessive bleeding, bruising or enlarged lymph nodes.   Physical Exam  BP 130/80   Pulse (!) 46   Temp 97.9 F (36.6 C)   Resp 16   Ht 6' (1.829 m)   Wt 172 lb (78 kg)   SpO2 96%   BMI 23.33 kg/m    Appears  well nourished, well groomed  and in no distress.  Eyes: PERRLA, EOMs, conjunctiva no swelling or erythema. Sinuses: No frontal/maxillary tenderness ENT/Mouth: EAC's clear, TM's nl w/o erythema, bulging. Nares clear w/o erythema, swelling, exudates. Oropharynx clear without erythema or exudates. Oral hygiene is good. Tongue normal, non obstructing. Hearing intact.  Neck: Supple. Thyroid not palpable. Car 2+/2+ without bruits, nodes or JVD. Chest: Respirations nl with BS clear &  equal w/o rales, rhonchi, wheezing or stridor.  Cor: Heart sounds normal w/ regular rate and rhythm without sig. murmurs, gallops, clicks or rubs. Peripheral pulses normal and equal  without edema.  Abdomen: Soft & bowel sounds normal. Non-tender w/o guarding, rebound, hernias, masses or organomegaly.  Lymphatics: Unremarkable.  Musculoskeletal: Full ROM all peripheral extremities, joint stability, 5/5 strength and normal gait.  Skin: Warm, dry without exposed rashes, lesions or ecchymosis apparent.  Neuro: Cranial nerves intact, reflexes equal bilaterally. Sensory-motor testing grossly intact. Tendon reflexes grossly intact.  Pysch: Alert & oriented x 3.  Insight and judgement nl & appropriate. No ideations.   Assessment and Plan:   1. Essential hypertension  - Continue medication, monitor blood pressure at home.  - Continue DASH diet.  Reminder to go to the ER if any CP,  SOB, nausea, dizziness, severe HA, changes vision/speech.   - CBC with Differential/Platelet - COMPLETE METABOLIC PANEL WITH GFR - Magnesium - TSH  2. Hyperlipidemia, mixed  - Continue diet/meds, exercise,& lifestyle modifications.  - Continue monitor periodic cholesterol/liver & renal functions     - Lipid panel - TSH  3. Abnormal glucose  - Continue diet, exercise  - Lifestyle modifications.  - Monitor appropriate labs   - Hemoglobin A1c - Insulin, random   4. Vitamin D deficiency  - Continue  supplementation   - VITAMIN D 25 Hydroxy   5. Thoracic aorta atherosclerosis (HCC) by CT scan  09/12/2018  - Lipi d panel  6. Medication management  - CBC with Differential/Platelet - COMPLETE METABOLIC PANEL WITH GFR - Magnesium - Lipid panel - TSH - Hemoglobin A1c - Insulin, random - VITAMIN D 25 Hydroxy           Discussed  regular exercise, BP monitoring, weight control to achieve/maintain BMI less than 25 and discussed med and SE's. Recommended labs to assess /monitor clinical status .  I discussed the assessment and treatment plan with the patient. The patient was provided an opportunity to ask questions and all were answered. The patient agreed with the plan and demonstrated an understanding of the instructions.  I provided over 30 minutes of exam, counseling, chart review and  complex critical decision making.        The patient was advised to call back or seek an in-person evaluation if the symptoms worsen or if the condition fails to improve as anticipated.   Marinus Maw, MD

## 2022-12-01 NOTE — Patient Instructions (Signed)

## 2022-12-02 ENCOUNTER — Other Ambulatory Visit: Payer: Self-pay | Admitting: Internal Medicine

## 2022-12-02 LAB — CBC WITH DIFFERENTIAL/PLATELET
Absolute Lymphocytes: 1554 {cells}/uL (ref 850–3900)
Absolute Monocytes: 630 {cells}/uL (ref 200–950)
Basophils Absolute: 28 {cells}/uL (ref 0–200)
Basophils Relative: 0.4 %
Eosinophils Absolute: 63 {cells}/uL (ref 15–500)
Eosinophils Relative: 0.9 %
HCT: 46.6 % (ref 38.5–50.0)
Hemoglobin: 16 g/dL (ref 13.2–17.1)
MCH: 36 pg — ABNORMAL HIGH (ref 27.0–33.0)
MCHC: 34.3 g/dL (ref 32.0–36.0)
MCV: 105 fL — ABNORMAL HIGH (ref 80.0–100.0)
MPV: 10.4 fL (ref 7.5–12.5)
Monocytes Relative: 9 %
Neutro Abs: 4725 {cells}/uL (ref 1500–7800)
Neutrophils Relative %: 67.5 %
Platelets: 161 10*3/uL (ref 140–400)
RBC: 4.44 10*6/uL (ref 4.20–5.80)
RDW: 12.1 % (ref 11.0–15.0)
Total Lymphocyte: 22.2 %
WBC: 7 10*3/uL (ref 3.8–10.8)

## 2022-12-02 LAB — COMPLETE METABOLIC PANEL WITH GFR
AG Ratio: 1.8 (calc) (ref 1.0–2.5)
ALT: 14 U/L (ref 9–46)
AST: 25 U/L (ref 10–35)
Albumin: 4 g/dL (ref 3.6–5.1)
Alkaline phosphatase (APISO): 34 U/L — ABNORMAL LOW (ref 35–144)
BUN/Creatinine Ratio: 11 (calc) (ref 6–22)
BUN: 16 mg/dL (ref 7–25)
CO2: 30 mmol/L (ref 20–32)
Calcium: 9.7 mg/dL (ref 8.6–10.3)
Chloride: 105 mmol/L (ref 98–110)
Creat: 1.5 mg/dL — ABNORMAL HIGH (ref 0.70–1.28)
Globulin: 2.2 g/dL (ref 1.9–3.7)
Glucose, Bld: 109 mg/dL — ABNORMAL HIGH (ref 65–99)
Potassium: 4.6 mmol/L (ref 3.5–5.3)
Sodium: 141 mmol/L (ref 135–146)
Total Bilirubin: 1 mg/dL (ref 0.2–1.2)
Total Protein: 6.2 g/dL (ref 6.1–8.1)
eGFR: 48 mL/min/{1.73_m2} — ABNORMAL LOW (ref >=60)

## 2022-12-02 LAB — LIPID PANEL
Cholesterol: 138 mg/dL (ref <200)
HDL: 56 mg/dL (ref >=40)
LDL Cholesterol (Calc): 62 mg/dL
Non-HDL Cholesterol (Calc): 82 mg/dL (ref <130)
Total CHOL/HDL Ratio: 2.5 (calc) (ref <5.0)
Triglycerides: 113 mg/dL (ref <150)

## 2022-12-02 LAB — HEMOGLOBIN A1C
Hgb A1c MFr Bld: 5.6 %{Hb} (ref <5.7)
Mean Plasma Glucose: 114 mg/dL
eAG (mmol/L): 6.3 mmol/L

## 2022-12-02 LAB — MAGNESIUM: Magnesium: 1.8 mg/dL (ref 1.5–2.5)

## 2022-12-02 LAB — TSH: TSH: 3.26 m[IU]/L (ref 0.40–4.50)

## 2022-12-02 LAB — VITAMIN D 25 HYDROXY (VIT D DEFICIENCY, FRACTURES): Vit D, 25-Hydroxy: 100 ng/mL (ref 30–100)

## 2022-12-02 LAB — INSULIN, RANDOM: Insulin: 13.4 u[IU]/mL

## 2022-12-02 NOTE — Progress Notes (Signed)
<>*<>*<>*<>*<>*<>*<>*<>*<>*<>*<>*<>*<>*<>*<>*<>*<>*<>*<>*<>*<>*<>*<>*<>*<> <>*<>*<>*<>*<>*<>*<>*<>*<>*<>*<>*<>*<>*<>*<>*<>*<>*<>*<>*<>*<>*<>*<>*<>*<>  -Test results slightly outside the reference range are not unusual. If there is anything important, I will review this with you,  otherwise it is considered normal test values.  If you have further questions,  please do not hesitate to contact me at the office or via My Chart.   <>*<>*<>*<>*<>*<>*<>*<>*<>*<>*<>*<>*<>*<>*<>*<>*<>*<>*<>*<>*<>*<>*<>*<>*<> <>*<>*<>*<>*<>*<>*<>*<>*<>*<>*<>*<>*<>*<>*<>*<>*<>*<>*<>*<>*<>*<>*<>*<>*<>  -  [facility] suggests EDicient in Vitamin B12, So . . .   Suggest  you  take a Vitamin B12 supplement that you dissolve in your mouth   <>*<>*<>*<>*<>*<>*<>*<>*<>*<>*<>*<>*<>*<>*<>*<>*<>*<>*<>*<>*<>*<>*<>*<>*<> <>*<>*<>*<>*<>*<>*<>*<>*<>*<>*<>*<>*<>*<>*<>*<>*<>*<>*<>*<>*<>*<>*<>*<>*<>  - Kidney functions still look a little dehydrated   - Very important to drink adequate amounts of fluids to                                                                                       prevent permanent damage    - Recommend drink at least 6 bottles (16 ounces) of fluids /water /day =                                                                                                                                   96 Oz ~100 oz  - 100 oz = 3,000 cc or 3 liters / day  - >>                                                      That's 1 &1/2 bottles of a 2 liter soda bottle /day !   <>*<>*<>*<>*<>*<>*<>*<>*<>*<>*<>*<>*<>*<>*<>*<>*<>*<>*<>*<>*<>*<>*<>*<>*<> <>*<>*<>*<>*<>*<>*<>*<>*<>*<>*<>*<>*<>*<>*<>*<>*<>*<>*<>*<>*<>*<>*<>*<>*<>  -  Magnesium = 1.8 is Very   Very  low- goal is betw 2.0 - 2.5,   - So..............Marland Kitchen  Recommend that you take                                                             Magnesium 500 mg tablet  3 x / day with Meals   - also important to eat lots of  leafy green vegetables   - spinach - Kale -  collards - greens - okra - asparagus  - broccoli - quinoa - squash - almonds   - black, red, white beans  -  peas - green beans  <>*<>*<>*<>*<>*<>*<>*<>*<>*<>*<>*<>*<>*<>*<>*<>*<>*<>*<>*<>*<>*<>*<>*<>*<> <>*<>*<>*<>*<>*<>*<>*<>*<>*<>*<>*<>*<>*<>*<>*<>*<>*<>*<>*<>*<>*<>*<>*<>*<>  -  Chol = 138 is  Excellent   -  Very low risk for Heart Attack  / Stroke  <>*<>*<>*<>*<>*<>*<>*<>*<>*<>*<>*<>*<>*<>*<>*<>*<>*<>*<>*<>*<>*<>*<>*<>*<> <>*<>*<>*<>*<>*<>*<>*<>*<>*<>*<>*<>*<>*<>*<>*<>*<>*<>*<>*<>*<>*<>*<>*<>*<>  -  A1c = Normal - No Diabetes  - Great   !   <>*<>*<>*<>*<>*<>*<>*<>*<>*<>*<>*<>*<>*<>*<>*<>*<>*<>*<>*<>*<>*<>*<>*<>*<> <>*<>*<>*<>*<>*<>*<>*<>*<>*<>*<>*<>*<>*<>*<>*<>*<>*<>*<>*<>*<>*<>*<>*<>*<>  - Vitamin D = 100   - Excellent   !        Please keep dose same   <>*<>*<>*<>*<>*<>*<>*<>*<>*<>*<>*<>*<>*<>*<>*<>*<>*<>*<>*<>*<>*<>*<>*<>*<> <>*<>*<>*<>*<>*<>*<>*<>*<>*<>*<>*<>*<>*<>*<>*<>*<>*<>*<>*<>*<>*<>*<>*<>*<>  - All Else - CBC - Kidneys - Electrolytes - Liver - Magnesium & Thyroid    - all  Normal / OK  <>*<>*<>*<>*<>*<>*<>*<>*<>*<>*<>*<>*<>*<>*<>*<>*<>*<>*<>*<>*<>*<>*<>*<>*<> <>*<>*<>*<>*<>*<>*<>*<>*<>*<>*<>*<>*<>*<>*<>*<>*<>*<>*<>*<>*<>*<>*<>*<>*<>

## 2022-12-04 ENCOUNTER — Encounter: Payer: Self-pay | Admitting: Internal Medicine

## 2022-12-13 ENCOUNTER — Other Ambulatory Visit: Payer: Self-pay | Admitting: Nurse Practitioner

## 2022-12-13 DIAGNOSIS — R918 Other nonspecific abnormal finding of lung field: Secondary | ICD-10-CM

## 2022-12-13 DIAGNOSIS — Z87891 Personal history of nicotine dependence: Secondary | ICD-10-CM

## 2022-12-20 ENCOUNTER — Encounter: Payer: Self-pay | Admitting: Nurse Practitioner

## 2022-12-21 ENCOUNTER — Encounter: Payer: Self-pay | Admitting: Internal Medicine

## 2022-12-30 ENCOUNTER — Ambulatory Visit
Admission: RE | Admit: 2022-12-30 | Discharge: 2022-12-30 | Disposition: A | Discharge status: Home / Self Care | Payer: Medicare HMO | Source: Ambulatory Visit | Attending: Nurse Practitioner | Admitting: Nurse Practitioner

## 2022-12-30 DIAGNOSIS — Z87891 Personal history of nicotine dependence: Secondary | ICD-10-CM

## 2022-12-30 DIAGNOSIS — R918 Other nonspecific abnormal finding of lung field: Secondary | ICD-10-CM

## 2023-01-06 ENCOUNTER — Other Ambulatory Visit (HOSPITAL_COMMUNITY): Payer: Self-pay

## 2023-02-02 ENCOUNTER — Other Ambulatory Visit (HOSPITAL_COMMUNITY): Payer: Self-pay

## 2023-03-15 ENCOUNTER — Ambulatory Visit: Payer: Medicare HMO | Admitting: Nurse Practitioner

## 2023-03-20 ENCOUNTER — Other Ambulatory Visit: Payer: Self-pay

## 2023-03-20 ENCOUNTER — Other Ambulatory Visit (HOSPITAL_COMMUNITY): Payer: Self-pay

## 2023-03-20 ENCOUNTER — Other Ambulatory Visit: Payer: Self-pay | Admitting: Internal Medicine

## 2023-03-20 DIAGNOSIS — E782 Mixed hyperlipidemia: Secondary | ICD-10-CM

## 2023-03-20 MED ORDER — ROSUVASTATIN CALCIUM 40 MG PO TABS
40.0000 mg | ORAL_TABLET | Freq: Every day | ORAL | 0 refills | Status: DC
  Filled 2023-03-20: qty 90, 90d supply, fill #0

## 2023-03-23 ENCOUNTER — Ambulatory Visit (HOSPITAL_BASED_OUTPATIENT_CLINIC_OR_DEPARTMENT_OTHER): Payer: Medicare HMO | Admitting: Student

## 2023-03-23 ENCOUNTER — Ambulatory Visit (HOSPITAL_BASED_OUTPATIENT_CLINIC_OR_DEPARTMENT_OTHER): Payer: Medicare HMO

## 2023-03-23 DIAGNOSIS — M25551 Pain in right hip: Secondary | ICD-10-CM | POA: Diagnosis not present

## 2023-03-23 NOTE — Progress Notes (Signed)
Chief Complaint: Right groin pain     History of Present Illness:   Discussed the use of AI scribe software for clinical note transcription with the patient, who gave verbal consent to proceed.  Juan Torres is a pleasant 78 y.o. male with a history of bilateral hernia repair, presents with localized groin pain that started a couple of months ago. The onset of the pain coincided with the patient starting to heat with wood during the winter, which involved carrying heavy loads of wood. The pain does not radiate down the leg and is exacerbated by coughing, sneezing, and lifting heavy objects. The patient has been managing the pain with ice packs and rest, which seem to provide some relief. The patient's spouse also notes that the patient has to get down on his hands and knees to load the wood stove, which may contribute to the pain. He has not taken any medications for the pain. He is a retired Radiation protection practitioner.  Surgical History:   Bilateral inguinal hernia repair 2020  PMH/PSH/Family History/Social History/Meds/Allergies:    Past Medical History:  Diagnosis Date   Adenomatous colon polyp    GERD (gastroesophageal reflux disease)    History of basal cell cancer 2012   Hyperlipidemia    Hypertension    Prediabetes    Prostate cancer (HCC) DX 12/19/11   bx=Adenocarcinoma,gleason=3+3=6,volume=42cc,PSA=4.63   Pulmonary nodules 12/10/2018   Numerous unchanged/benign appearing per CT lung 12/2018   Vitamin D deficiency    Past Surgical History:  Procedure Laterality Date   COLONOSCOPY W/ POLYPECTOMY     Adenomatous   CYSTOSCOPY N/A 04/04/2012   Procedure: CYSTOSCOPY;  Surgeon: Milford Cage, MD;  Location: [facility];  Service: Urology;  Laterality: N/A;   EYE SURGERY     bilateral cataracts    INGUINAL HERNIA REPAIR Bilateral 10/18/2018   Procedure: LAPAROSCOPIC BILATERAL INGUINAL HERNIA REPAIR;  Surgeon: Luretha Murphy, MD;   Location: [facility];  Service: General;  Laterality: Bilateral;   PROSTATE BIOPSY  12/19/2011   Procedure: BIOPSY TRANSRECTAL ULTRASONIC PROSTATE (TUBP);  Surgeon: Milford Cage, MD;  Location: [facility];  Service: Urology;  Laterality: N/A;     RADIOACTIVE SEED IMPLANT N/A 04/04/2012   Procedure: RADIOACTIVE SEED IMPLANT;  Surgeon: Milford Cage, MD;  Location: [facility];  Service: Urology;  Laterality: N/A;  Seeds Implanted     72  Seeds Found in Bladder     None    Social History   Socioeconomic History   Marital status: Married    Spouse name: Not on file   Number of children: 2   Years of education: Not on file   Highest education level: Not on file  Occupational History   Not on file  Tobacco Use   Smoking status: Former    Current packs/day: 0.00    Average packs/day: 1 pack/day for 30.0 years (30.0 ttl pk-yrs)    Types: Cigarettes    Start date: 12/10/1988    Quit date: 12/11/2018    Years since quitting: 4.2   Smokeless tobacco: Never  Vaping Use   Vaping status: Never Used  Substance and Sexual Activity   Alcohol use: Not Currently    Alcohol/week: 8.0 standard drinks of alcohol    Types: 8 Glasses of wine per  week   Drug use: Yes    Frequency: 7.0 times per week    Types: Marijuana    Comment: / marijuana-last smoked 10/15/2018   Sexual activity: Not on file  Other Topics Concern   Not on file  Social History Narrative   Lives at home with spouse   Right handed   Caffeine: none   Social Drivers of Corporate investment banker Strain: Not on file  Food Insecurity: Not on file  Transportation Needs: Not on file  Physical Activity: Not on file  Stress: Not on file  Social Connections: Not on file   Family History  Problem Relation Age of Onset   Breast cancer Mother    Heart attack Father 78   Dementia Father    Prostate cancer Maternal Uncle        prostate   Ulcers Maternal Grandmother    Cancer Maternal  Grandfather        colon   Prostate cancer Cousin        prostate cancer 1st maternal    Sleep apnea Neg Hx    Allergies  Allergen Reactions   Lipitor [Atorvastatin] Other (See Comments)    fatigued   Niacin And Related Other (See Comments)    Flushing   Penicillins Rash   Current Outpatient Medications  Medication Sig Dispense Refill   Cholecalciferol (VITAMIN D3) 125 MCG (5000 UT) TABS Take 15,000 Units by mouth every morning.     cloNIDine (CATAPRES) 0.2 MG tablet Take 1 tablet (0.2 mg total) by mouth daily as needed. For BP >160/90 60 tablet 2   ezetimibe (ZETIA) 10 MG tablet Take 1 tablet (10 mg total) by mouth daily for cholesterol. 90 tablet 3   famotidine (PEPCID) 20 MG tablet Take 1 tablet (20 mg total) by mouth 2 (two) times daily; before breakfast and before dinner 180 tablet 0   fenofibrate micronized (LOFIBRA) 134 MG capsule Take 1 capsule (134 mg total) by mouth daily (blood fats). 90 capsule 3   Magnesium 400 MG CAPS Take 400 mg by mouth every evening.      Omega-3 Fatty Acids (FISH OIL) 1000 MG CAPS Take 1,000 mg by mouth every morning.     omeprazole (PRILOSEC) 40 MG capsule Take 1 capsule (40 mg total) by mouth daily to prevent indigestion & heartburn 90 capsule 3   propranolol ER (INDERAL LA) 120 MG 24 hr capsule Take 1 capsule (120 mg total) by mouth daily. For blood pressure and tremor 90 capsule 3   rosuvastatin (CRESTOR) 40 MG tablet Take 1 tablet (40 mg total) by mouth daily for cholesterol 90 tablet 0   No current facility-administered medications for this visit.   No results found.  Review of Systems:   A ROS was performed including pertinent positives and negatives as documented in the HPI.  Physical Exam :   Constitutional: NAD and appears stated age Neurological: Alert and oriented Psych: Appropriate affect and cooperative There were no vitals taken for this visit.   Comprehensive Musculoskeletal Exam:    Passive range of motion of the right hip  to 120 degrees flexion, 30 degrees external rotation, and 20 degrees internal rotation.  No tenderness to palpation over the greater trochanter or hip flexor musculature.  Mild tenderness over the right inguinal canal without any palpable bulge in the supine or seated position.  Imaging:   Xray (AP pelvis, right hip 2 views): No fracture, dislocation, or notable osteoarthritis.  Prior hernia repair mesh  and prostate seeding is seen.   I personally reviewed and interpreted the radiographs.   Assessment:     Pain is localized to the groin area and worsens with heavy lifting, with no radiation. There is a history of bilateral hernia repair with mesh placement in 2020. Imaging shows no significant hip arthritis and low suspicion for muscular strain given location and pain characteristics. Recommend following up with General Surgery for evaluation and hernia recheck. Advise avoiding heavy lifting and applying ice for relief.  Can plan to follow up as needed.  Plan :    - Follow up with general surgery for further evaluation     I personally saw and evaluated the patient, and participated in the management and treatment plan.  Hazle Nordmann, PA-C Orthopedics

## 2023-03-27 ENCOUNTER — Telehealth: Payer: Self-pay | Admitting: Student

## 2023-03-27 NOTE — Telephone Encounter (Signed)
Patient's wife Grant Fontana called asked if the referral can be made to the practice because Dr. Daphine Deutscher has retired.  Grant Fontana said the referral need to sent to [facility].   Suzane asked to be called if there are other questions. The number to contact Grant Fontana is [phone]

## 2023-03-28 ENCOUNTER — Other Ambulatory Visit (HOSPITAL_BASED_OUTPATIENT_CLINIC_OR_DEPARTMENT_OTHER): Payer: Self-pay

## 2023-03-28 DIAGNOSIS — M25551 Pain in right hip: Secondary | ICD-10-CM

## 2023-03-30 ENCOUNTER — Ambulatory Visit: Payer: Self-pay | Admitting: Internal Medicine

## 2023-03-30 NOTE — Telephone Encounter (Signed)
Pt on warm transfer with another triage RN.   Copied from CRM [id number]. Topic: Clinical - Medical Advice >> Mar 30, 2023 11:12 AM Alcus Dad wrote: Reason for CRM: Wife of Patient stated that patient has a mesh from previous hernia. Patient is swollen and in severe pain off and on

## 2023-03-30 NOTE — Telephone Encounter (Signed)
Chief Complaint: Inguinal hernia Symptoms: Pain, bulging Frequency: A week Pertinent Negatives: Patient denies fever Disposition: [] ED /[] Urgent Care (no appt availability in office) / [x] Appointment(In office/virtual)/ []  [facility]/ [] Home Care/ [x] Refused Recommended Disposition /[] [facility]/ []  Follow-up with PCP Additional Notes: Spoke to patient's wife on behalf of her husband who is experiencing pain in his right groin. Patient's wife is a retired Charity fundraiser. Wife stated patient has had hernias before. Wife stated that the right inguinal hernia is the size of a golf ball. Wife stated that her husband's pain can get up to an 8 out of 10, but is not constant. Ice and ibuprofen help to relieve the pain. Patient was a previous patient of Dr. Lucky Cowboy, who recently passed away. Wife stated that she has arranged to establish care with Dr. Yetta Barre and [facility]. Wife stated that appointment with Dr. Yetta Barre is not until end of March. Patient was able to receive a referral for surgery from the office who performed his last surgery, but the appointment is not until next week. Wife does not want to wait until next week for her husband to be seen and is seeking another referral. This RN explained that the office would not be able to provide a referral until the patient is seen by his PCP. This RN advised that there were no new patient appointments available until March. This RN advised wife/patient that I would be happy to search for sooner appointments if patient was interested in establishing care at another office. Patient/wife declined. This RN advised patient/wife to seek care at [facility] or ED if pain becomes severe. Patient/wife complied.   Reason for Disposition  [1] New-onset hernia suspected (reducible bulge in groin or abdomen; non-tender) AND [2] NO pain or vomiting  Answer Assessment - Initial Assessment Questions 1. MECHANISM: "How did the injury happen?" (e.g., twisting  injury, direct blow)      Denies  2. ONSET: "When did the injury happen?" (Minutes or hours ago)      A week ago 3. LOCATION: "Where is the injury located?"      Right groin 4. APPEARANCE of INJURY: "What does the injury look like?"  (e.g., looks normal; bruise, swelling)     Bulging the size of a golf ball  5. PAIN: "Is there pain?" If Yes, ask: "How bad is the pain?"   "What does it keep you from doing?" (e.g., Scale 1-10; or mild, moderate, severe)   -  NONE: (0): No pain.   -  MILD (1-3): Doesn't interfere with normal activities.    -  MODERATE (4-7): Interferes with normal activities (e.g., work or school) or awakens from sleep, limping.    -  SEVERE (8-10): Excruciating pain, unable to do any normal activities, unable to walk.     8 out of 10 when pain is present, but pain goes away with ice and ibuprofen 6. SIZE: For cuts, bruises, or swelling, ask: "How large is it?" (e.g., inches or centimeters;  entire joint)      Size of a golf ball 8. OTHER SYMPTOMS: "Do you have any other symptoms?"      Loss of appetite and "not feeling well"  Protocols used: Groin Injury and Strain-A-AH, Hernia-A-AH

## 2023-04-05 ENCOUNTER — Ambulatory Visit: Payer: Self-pay | Admitting: Surgery

## 2023-04-05 NOTE — H&P (Signed)
 Juan Torres [id number]   Referring Provider:  Room, Emergency   Subjective   Chief Complaint: New Consultation     History of Present Illness:    78 year old man with history of GERD, hyperlipidemia, hypertension, prostate cancer and vitamin D deficiency who presents for evaluation of a possible recurrent right inguinal hernia.  He had a bilateral laparoscopic TEP repair by Dr. Daphine Deutscher in 2020.  Operative report describes indirect hernias, right greater than left.  No other abdominal surgery.  He has been experiencing right groin pain worse with heavy lifting and reports that the size of a golf ball in the right groin.  He was actually evaluated by orthopedic surgery about 2 weeks ago for this who did not think this was related to muscular strain or arthritis.  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  Review of Systems: A complete review of systems was obtained from the patient.  I have reviewed this information and discussed as appropriate with the patient.  See HPI as well for other ROS.   Medical History: Past Medical History:  Diagnosis Date   GERD (gastroesophageal reflux disease)    History of cancer    Hyperlipidemia    Hypertension     There is no problem list on file for this patient.   Past Surgical History:  Procedure Laterality Date   CATARACT EXTRACTION     HERNIA REPAIR     PROSTATE SURGERY       Allergies  Allergen Reactions   Penicillins Rash    Current Outpatient Medications on File Prior to Visit  Medication Sig Dispense Refill   ezetimibe (ZETIA) 10 mg tablet Take 10 mg by mouth     fenofibrate micronized (LOFIBRA) 134 MG capsule Take 134 mg by mouth     omega-3 fatty acids/fish oil (FISH OIL) 340-1,000 mg capsule Take 1,000 mg by mouth every morning     omeprazole (PRILOSEC) 40 MG DR capsule Take 40 mg by mouth     rosuvastatin (CRESTOR) 40 MG tablet Take 40 mg  by mouth     No current facility-administered medications on file prior to visit.    Family History  Problem Relation Age of Onset   Breast cancer Mother    Obesity Father    Breast cancer Sister      Social History   Tobacco Use  Smoking Status Every Day   Types: Cigars  Smokeless Tobacco Never     Social History   Socioeconomic History   Marital status: Married  Tobacco Use   Smoking status: Every Day    Types: Cigars   Smokeless tobacco: Never  Substance and Sexual Activity   Alcohol use: Yes   Drug use: Never   Social Drivers of Health   Housing Stability: Unknown (04/05/2023)   Housing Stability Vital Sign    Homeless in the Last Year: No    Objective:    Vitals:   04/05/23 1503 04/05/23 1504  BP: 124/74   Pulse: 81   Temp: 36.6 C (97.8 F)   SpO2: 97%   Weight: 70.4 kg (155 lb 3.2 oz)   Height: 182.9 cm (6')   PainSc:  0-No pain  PainLoc:  Abdomen    Body mass index is 21.05 kg/m.  Gen: A&Ox3, no distress  Unlabored respirations Abdomen soft, nontender, nondistended.  He has a reducible right inguinal  hernia which is tender, with no overlying skin changes.  Assessment and Plan:  Diagnoses and all orders for this visit:  Unilateral recurrent inguinal hernia without obstruction or gangrene    We discussed the relevant anatomy and we discussed options for repair.  I recommend an open approach as he has previously had a laparoscopic/preperitoneal repair and went over the technique of the procedure.  Discussed risks of bleeding, infection, pain, scarring, injury to structures in the area including nerves, blood vessels, bowel, bladder, risk of chronic pain, hernia re-recurrence, risk of seroma or hematoma, urinary retention, and risks of general anesthesia including cardiovascular, pulmonary, and thromboembolic complications.  We discussed possibility that his groin pain may not completely resolve with repair.  We discussed typical postop recovery,  timeline, and activity limitations.  We also discussed the option of ongoing observation, with high rate of ultimately returning for surgery and risk of increasing size/symptoms from the hernia as well as incarceration/strangulation and went over symptoms that should prompt him to seek emergency treatment.  I do recommend proceeding with surgery at this point given degree of pain and episodes of incarceration that his wife describes.  Questions were welcomed and answered to the patient's satisfaction. Patient wishes to proceed with scheduling.   Yehonatan Grandison Carlye Grippe, MD

## 2023-04-05 NOTE — H&P (View-Only) (Signed)
 Juan Torres [id number]   Referring Provider:  Room, Emergency   Subjective   Chief Complaint: New Consultation     History of Present Illness:    78 year old man with history of GERD, hyperlipidemia, hypertension, prostate cancer and vitamin D deficiency who presents for evaluation of a possible recurrent right inguinal hernia.  He had a bilateral laparoscopic TEP repair by Dr. Daphine Deutscher in 2020.  Operative report describes indirect hernias, right greater than left.  No other abdominal surgery.  He has been experiencing right groin pain worse with heavy lifting and reports that the size of a golf ball in the right groin.  He was actually evaluated by orthopedic surgery about 2 weeks ago for this who did not think this was related to muscular strain or arthritis.  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  Review of Systems: A complete review of systems was obtained from the patient.  I have reviewed this information and discussed as appropriate with the patient.  See HPI as well for other ROS.   Medical History: Past Medical History:  Diagnosis Date   GERD (gastroesophageal reflux disease)    History of cancer    Hyperlipidemia    Hypertension     There is no problem list on file for this patient.   Past Surgical History:  Procedure Laterality Date   CATARACT EXTRACTION     HERNIA REPAIR     PROSTATE SURGERY       Allergies  Allergen Reactions   Penicillins Rash    Current Outpatient Medications on File Prior to Visit  Medication Sig Dispense Refill   ezetimibe (ZETIA) 10 mg tablet Take 10 mg by mouth     fenofibrate micronized (LOFIBRA) 134 MG capsule Take 134 mg by mouth     omega-3 fatty acids/fish oil (FISH OIL) 340-1,000 mg capsule Take 1,000 mg by mouth every morning     omeprazole (PRILOSEC) 40 MG DR capsule Take 40 mg by mouth     rosuvastatin (CRESTOR) 40 MG tablet Take 40 mg  by mouth     No current facility-administered medications on file prior to visit.    Family History  Problem Relation Age of Onset   Breast cancer Mother    Obesity Father    Breast cancer Sister      Social History   Tobacco Use  Smoking Status Every Day   Types: Cigars  Smokeless Tobacco Never     Social History   Socioeconomic History   Marital status: Married  Tobacco Use   Smoking status: Every Day    Types: Cigars   Smokeless tobacco: Never  Substance and Sexual Activity   Alcohol use: Yes   Drug use: Never   Social Drivers of Health   Housing Stability: Unknown (04/05/2023)   Housing Stability Vital Sign    Homeless in the Last Year: No    Objective:    Vitals:   04/05/23 1503 04/05/23 1504  BP: 124/74   Pulse: 81   Temp: 36.6 C (97.8 F)   SpO2: 97%   Weight: 70.4 kg (155 lb 3.2 oz)   Height: 182.9 cm (6')   PainSc:  0-No pain  PainLoc:  Abdomen    Body mass index is 21.05 kg/m.  Gen: A&Ox3, no distress  Unlabored respirations Abdomen soft, nontender, nondistended.  He has a reducible right inguinal  hernia which is tender, with no overlying skin changes.  Assessment and Plan:  Diagnoses and all orders for this visit:  Unilateral recurrent inguinal hernia without obstruction or gangrene    We discussed the relevant anatomy and we discussed options for repair.  I recommend an open approach as he has previously had a laparoscopic/preperitoneal repair and went over the technique of the procedure.  Discussed risks of bleeding, infection, pain, scarring, injury to structures in the area including nerves, blood vessels, bowel, bladder, risk of chronic pain, hernia re-recurrence, risk of seroma or hematoma, urinary retention, and risks of general anesthesia including cardiovascular, pulmonary, and thromboembolic complications.  We discussed possibility that his groin pain may not completely resolve with repair.  We discussed typical postop recovery,  timeline, and activity limitations.  We also discussed the option of ongoing observation, with high rate of ultimately returning for surgery and risk of increasing size/symptoms from the hernia as well as incarceration/strangulation and went over symptoms that should prompt him to seek emergency treatment.  I do recommend proceeding with surgery at this point given degree of pain and episodes of incarceration that his wife describes.  Questions were welcomed and answered to the patient's satisfaction. Patient wishes to proceed with scheduling.   Yehonatan Grandison Carlye Grippe, MD

## 2023-04-14 ENCOUNTER — Encounter (HOSPITAL_COMMUNITY): Payer: Self-pay

## 2023-04-14 NOTE — Patient Instructions (Addendum)
 SURGICAL WAITING ROOM VISITATION  Patients having surgery or a procedure may have no more than 2 support people in the waiting area - these visitors may rotate.    Children under the age of 7 must have an adult with them who is not the patient.  Due to an increase in RSV and influenza rates and associated hospitalizations, children ages 40 and under may not visit patients in [facility] hospitals.  Visitors with respiratory illnesses are discouraged from visiting and should remain at home.  If the patient needs to stay at the hospital during part of their recovery, the visitor guidelines for inpatient rooms apply. Pre-op nurse will coordinate an appropriate time for 1 support person to accompany patient in pre-op.  This support person may not rotate.    Please refer to the [facility] website for the visitor guidelines for Inpatients (after your surgery is over and you are in a regular room).       Your procedure is scheduled on: 04-25-23   Report to [facility] Main Entrance    Report to admitting at     11:15  AM   Call this number if you have problems the morning of surgery [phone]   Do not eat food :After Midnight.   After Midnight you may have the following liquids until __10:30 ____ AM/  DAY OF SURGERY  then nothing by mouth  Water Non-Citrus Juices (without pulp, NO RED-Apple, White grape, White cranberry) Black Coffee (NO MILK/CREAM OR CREAMERS, sugar ok)  Clear Tea (NO MILK/CREAM OR CREAMERS, sugar ok) regular and decaf                             Plain Jell-O (NO RED)                                           Fruit ices (not with fruit pulp, NO RED)                                     Popsicles (NO RED)                                                               Sports drinks like Gatorade (NO RED)                         If you have questions, please contact your surgeon's office.   FOLLOW ANY ADDITIONAL PRE OP INSTRUCTIONS YOU RECEIVED FROM YOUR  SURGEON'S OFFICE!!!     Oral Hygiene is also important to reduce your risk of infection.                                    Remember - BRUSH YOUR TEETH THE MORNING OF SURGERY WITH YOUR REGULAR TOOTHPASTE  DENTURES WILL BE REMOVED PRIOR TO SURGERY PLEASE DO NOT APPLY "Poly grip" OR ADHESIVES!!!   Do NOT smoke after Midnight   Stop all vitamins  and herbal supplements 7 days before surgery.   Take these medicines the morning of surgery with A SIP OF WATER: rosuvastatin, propranolol, omeprazole, fenofibrate, ezetimibe, clonidine if needed   .                              You may not have any metal on your body including hair pins, jewelry, and body piercing             Do not wear  lotions, powders, perfumes/cologne, or deodorant               Men may shave face and neck.   Do not bring valuables to the hospital. [facility] IS NOT             RESPONSIBLE   FOR VALUABLES.   Contacts, glasses, dentures or bridgework may not be worn into surgery.   Bring small overnight bag day of surgery.   DO NOT BRING YOUR HOME MEDICATIONS TO THE HOSPITAL. PHARMACY WILL DISPENSE MEDICATIONS LISTED ON YOUR MEDICATION LIST TO YOU DURING YOUR ADMISSION IN THE HOSPITAL!    Patients discharged on the day of surgery will not be allowed to drive home.  Someone NEEDS to stay with you for the first 24 hours after anesthesia.   Special Instructions: Bring a copy of your healthcare power of attorney and living will documents the day of surgery if you haven't scanned them before.              Please read over the following fact sheets you were given: IF YOU HAVE QUESTIONS ABOUT YOUR PRE-OP INSTRUCTIONS PLEASE CALL [phone]   If you test positive for Covid or have been in contact with anyone that has tested positive in the last 10 days please notify you surgeon.     - Preparing for Surgery Before surgery, you can play an important role.  Because skin is not sterile, your skin needs to be as  free of germs as possible.  You can reduce the number of germs on your skin by washing with CHG (chlorahexidine gluconate) soap before surgery.  CHG is an antiseptic cleaner which kills germs and bonds with the skin to continue killing germs even after washing. Please DO NOT use if you have an allergy to CHG or antibacterial soaps.  If your skin becomes reddened/irritated stop using the CHG and inform your nurse when you arrive at Short Stay. Do not shave (including legs and underarms) for at least 48 hours prior to the first CHG shower.  You may shave your face/neck. Please follow these instructions carefully:  1.  Shower with CHG Soap the night before surgery and the  morning of Surgery.  2.  If you choose to wash your hair, wash your hair first as usual with your  normal  shampoo.  3.  After you shampoo, rinse your hair and body thoroughly to remove the  shampoo.                           4.  Use CHG as you would any other liquid soap.  You can apply chg directly  to the skin and wash                       Gently with a scrungie or clean washcloth.  5.  Apply the CHG Soap to your  body ONLY FROM THE NECK DOWN.   Do not use on face/ open                           Wound or open sores. Avoid contact with eyes, ears mouth and genitals (private parts).                       Wash face,  Genitals (private parts) with your normal soap.             6.  Wash thoroughly, paying special attention to the area where your surgery  will be performed.  7.  Thoroughly rinse your body with warm water from the neck down.  8.  DO NOT shower/wash with your normal soap after using and rinsing off  the CHG Soap.                9.  Pat yourself dry with a clean towel.            10.  Wear clean pajamas.            11.  Place clean sheets on your bed the night of your first shower and do not  sleep with pets. Day of Surgery : Do not apply any lotions/deodorants the morning of surgery.  Please wear clean clothes to the  hospital/surgery center.  FAILURE TO FOLLOW THESE INSTRUCTIONS MAY RESULT IN THE CANCELLATION OF YOUR SURGERY PATIENT SIGNATURE_________________________________  NURSE SIGNATURE__________________________________  ________________________________________________________________________

## 2023-04-14 NOTE — Progress Notes (Addendum)
 PCP - [facility] Dr. Oneta Rack   12-01-22 epic   Appt. End of March to establish care with Dr. Yetta Barre [facility] .  Cardiologist - no  PPM/ICD -  Device Orders -  Rep Notified -   Chest x-ray -  EKG - 05-11-22 epic Stress Test -  ECHO -  Cardiac Cath -   Sleep Study -  CPAP -   Fasting Blood Sugar -  Checks Blood Sugar _____ times a day  Blood Thinner Instructions: Aspirin Instructions:  ERAS Protcol - PRE-SURGERY N/A   COVID vaccine -yes  Activity--Able to complete ADL's without CP or SOB does all yard work Anesthesia review: HTN, Pre-DM, PLT 101, PTSD  Patient denies shortness of breath, fever, cough and chest pain at PAT appointment   All instructions explained to the patient, with a verbal understanding of the material. Patient agrees to go over the instructions while at home for a better understanding. Patient also instructed to self quarantine after being tested for COVID-19. The opportunity to ask questions was provided.

## 2023-04-15 ENCOUNTER — Other Ambulatory Visit: Payer: Self-pay

## 2023-04-17 ENCOUNTER — Other Ambulatory Visit (HOSPITAL_COMMUNITY): Payer: Self-pay

## 2023-04-17 ENCOUNTER — Other Ambulatory Visit: Payer: Self-pay

## 2023-04-17 DIAGNOSIS — K219 Gastro-esophageal reflux disease without esophagitis: Secondary | ICD-10-CM

## 2023-04-17 MED ORDER — OMEPRAZOLE 40 MG PO CPDR
40.0000 mg | DELAYED_RELEASE_CAPSULE | Freq: Every day | ORAL | 0 refills | Status: DC
  Filled 2023-04-17: qty 90, 90d supply, fill #0

## 2023-04-18 ENCOUNTER — Other Ambulatory Visit: Payer: Self-pay

## 2023-04-18 ENCOUNTER — Encounter (HOSPITAL_COMMUNITY)
Admission: RE | Admit: 2023-04-18 | Discharge: 2023-04-18 | Disposition: A | Discharge status: Home / Self Care | Source: Ambulatory Visit | Attending: Surgery | Admitting: Surgery

## 2023-04-18 ENCOUNTER — Encounter (HOSPITAL_COMMUNITY): Payer: Self-pay

## 2023-04-18 VITALS — BP 174/98 | HR 87 | Temp 98.0°F | Resp 16 | Ht 72.0 in | Wt 152.0 lb

## 2023-04-18 DIAGNOSIS — Z8546 Personal history of malignant neoplasm of prostate: Secondary | ICD-10-CM | POA: Diagnosis not present

## 2023-04-18 DIAGNOSIS — I700 Atherosclerosis of aorta: Secondary | ICD-10-CM | POA: Insufficient documentation

## 2023-04-18 DIAGNOSIS — K760 Fatty (change of) liver, not elsewhere classified: Secondary | ICD-10-CM | POA: Insufficient documentation

## 2023-04-18 DIAGNOSIS — N189 Chronic kidney disease, unspecified: Secondary | ICD-10-CM | POA: Insufficient documentation

## 2023-04-18 DIAGNOSIS — I129 Hypertensive chronic kidney disease with stage 1 through stage 4 chronic kidney disease, or unspecified chronic kidney disease: Secondary | ICD-10-CM | POA: Insufficient documentation

## 2023-04-18 DIAGNOSIS — K4091 Unilateral inguinal hernia, without obstruction or gangrene, recurrent: Secondary | ICD-10-CM | POA: Diagnosis not present

## 2023-04-18 DIAGNOSIS — Z01812 Encounter for preprocedural laboratory examination: Secondary | ICD-10-CM | POA: Diagnosis present

## 2023-04-18 DIAGNOSIS — F17200 Nicotine dependence, unspecified, uncomplicated: Secondary | ICD-10-CM | POA: Insufficient documentation

## 2023-04-18 DIAGNOSIS — R7303 Prediabetes: Secondary | ICD-10-CM | POA: Diagnosis not present

## 2023-04-18 DIAGNOSIS — I10 Essential (primary) hypertension: Secondary | ICD-10-CM

## 2023-04-18 DIAGNOSIS — G4733 Obstructive sleep apnea (adult) (pediatric): Secondary | ICD-10-CM | POA: Insufficient documentation

## 2023-04-18 DIAGNOSIS — D696 Thrombocytopenia, unspecified: Secondary | ICD-10-CM | POA: Insufficient documentation

## 2023-04-18 DIAGNOSIS — I2510 Atherosclerotic heart disease of native coronary artery without angina pectoris: Secondary | ICD-10-CM | POA: Insufficient documentation

## 2023-04-18 DIAGNOSIS — J439 Emphysema, unspecified: Secondary | ICD-10-CM | POA: Diagnosis not present

## 2023-04-18 HISTORY — DX: Unspecified osteoarthritis, unspecified site: M19.90

## 2023-04-18 HISTORY — DX: Post-traumatic stress disorder, unspecified: F43.10

## 2023-04-18 HISTORY — DX: Sleep apnea, unspecified: G47.30

## 2023-04-18 HISTORY — DX: Headache, unspecified: R51.9

## 2023-04-18 HISTORY — DX: Depression, unspecified: F32.A

## 2023-04-18 HISTORY — DX: Prediabetes: R73.03

## 2023-04-18 HISTORY — DX: Chronic obstructive pulmonary disease, unspecified: J44.9

## 2023-04-18 HISTORY — DX: Anxiety disorder, unspecified: F41.9

## 2023-04-18 HISTORY — DX: Essential tremor: G25.0

## 2023-04-18 LAB — CBC
HCT: 42.9 % (ref 39.0–52.0)
Hemoglobin: 15.5 g/dL (ref 13.0–17.0)
MCH: 37.7 pg — ABNORMAL HIGH (ref 26.0–34.0)
MCHC: 36.1 g/dL — ABNORMAL HIGH (ref 30.0–36.0)
MCV: 104.4 fL — ABNORMAL HIGH (ref 80.0–100.0)
Platelets: 101 10*3/uL — ABNORMAL LOW (ref 150–400)
RBC: 4.11 MIL/uL — ABNORMAL LOW (ref 4.22–5.81)
RDW: 12 % (ref 11.5–15.5)
WBC: 5.4 10*3/uL (ref 4.0–10.5)
nRBC: 0 % (ref 0.0–0.2)

## 2023-04-18 LAB — BASIC METABOLIC PANEL
Anion gap: 10 (ref 5–15)
BUN: 9 mg/dL (ref 8–23)
CO2: 23 mmol/L (ref 22–32)
Calcium: 9.3 mg/dL (ref 8.9–10.3)
Chloride: 106 mmol/L (ref 98–111)
Creatinine, Ser: 1.24 mg/dL (ref 0.61–1.24)
GFR, Estimated: 60 mL/min — ABNORMAL LOW (ref >60)
Glucose, Bld: 112 mg/dL — ABNORMAL HIGH (ref 70–99)
Potassium: 3.7 mmol/L (ref 3.5–5.1)
Sodium: 139 mmol/L (ref 135–145)

## 2023-04-19 ENCOUNTER — Encounter (HOSPITAL_COMMUNITY): Payer: Self-pay

## 2023-04-19 NOTE — Anesthesia Preprocedure Evaluation (Addendum)
 Anesthesia Evaluation  Patient identified by MRN, date of birth, ID band Patient awake    Reviewed: Allergy & Precautions, NPO status , Patient's Chart, lab work & pertinent test results  Airway Mallampati: II  TM Distance: >3 FB     Dental no notable dental hx.    Pulmonary sleep apnea , COPD, Current Smoker   Pulmonary exam normal        Cardiovascular hypertension, Pt. on medications  Rhythm:Regular Rate:Normal     Neuro/Psych  Headaches  Anxiety Depression       GI/Hepatic Neg liver ROS,GERD  Medicated,,  Endo/Other  negative endocrine ROS    Renal/GU CRFRenal disease  negative genitourinary   Musculoskeletal  (+) Arthritis ,    Abdominal Normal abdominal exam  (+)   Peds  Hematology Lab Results      Component                Value               Date                      WBC                      5.4                 04/18/2023                HGB                      15.5                04/18/2023                HCT                      42.9                04/18/2023                MCV                      104.4 (H)           04/18/2023                PLT                      101 (L)             04/18/2023              Anesthesia Other Findings   Reproductive/Obstetrics                             Anesthesia Physical Anesthesia Plan  ASA: 2  Anesthesia Plan: General   Post-op Pain Management: Celebrex PO (pre-op)* and Tylenol PO (pre-op)*   Induction: Intravenous  PONV Risk Score and Plan: 1 and Ondansetron, Dexamethasone and Treatment may vary due to age or medical condition  Airway Management Planned: Mask and Oral ETT  Additional Equipment: None  Intra-op Plan:   Post-operative Plan: Extubation in OR  Informed Consent: I have reviewed the patients History and Physical, chart, labs and discussed the procedure including the risks, benefits and alternatives for the proposed  anesthesia with the patient or authorized representative who has  indicated his/her understanding and acceptance.     Dental advisory given  Plan Discussed with: CRNA  Anesthesia Plan Comments: (See PAT note from 3/12 by Sherlie Ban PA-C )        Anesthesia Quick Evaluation

## 2023-04-19 NOTE — Progress Notes (Signed)
 Case: [id number] Date/Time: 04/25/23 1315   Procedure: OPEN RIGHT INGUINAL HERNIA REPAIR WITH MESH (Right)   Anesthesia type: General   Pre-op diagnosis: RECURRENT RIGHT INGUINAL HERNIA   Location: [facility] ROOM 03 / [facility]   Surgeons: Berna Bue, MD       DISCUSSION: Juan Torres is a 78 yo male who presents to PAT prior to surgery above. PMH of current smoking, aortic atherosclerosis, HTN, OSA (CPAP use), COPD, GERD, hepatic steatosis, CKD, prediabetes, tremor, hx of prostate cancer, anxiety, depression, hx of ETOH abuse.  Last seen by PCP on 12/01/22. BP noted to be controlled. A1c 5.6. Establishing care with new PCP on 3/27  VS: BP (!) 174/98   Pulse 87   Temp 36.7 C (Oral)   Resp 16   Ht 6' (1.829 m)   Wt 68.9 kg   SpO2 97%   BMI 20.61 kg/m   PROVIDERS: Lucky Cowboy, MD   LABS: Labs reviewed: Acceptable for surgery. High MCV with low platelets. Pt has hx of heavy ETOH use. Consider LFTs/INR DOS.  (all labs ordered are listed, but only abnormal results are displayed)  Labs Reviewed  BASIC METABOLIC PANEL - Abnormal; Notable for the following components:      Result Value   Glucose, Bld 112 (*)    GFR, Estimated 60 (*)    All other components within normal limits  CBC - Abnormal; Notable for the following components:   RBC 4.11 (*)    MCV 104.4 (*)    MCH 37.7 (*)    MCHC 36.1 (*)    Platelets 101 (*)    All other components within normal limits     IMAGES: CT chest 12/30/22:  IMPRESSION: 1. Lung-RADS 2, benign appearance or behavior. Continue annual screening with low-dose chest CT without contrast in 12 months. 2. Hepatic steatosis. 3. Aortic Atherosclerosis (ICD10-I70.0) and Emphysema (ICD10-J43.9). Coronary artery atherosclerosis.    EKG:   CV:  Past Medical History:  Diagnosis Date   Adenomatous colon polyp    Anxiety    Arthritis    FEET   COPD (chronic obstructive pulmonary disease) (HCC)    Depression    Familial tremor    GERD  (gastroesophageal reflux disease)    Headache    When younger   History of basal cell cancer 2012   Hyperlipidemia    Hypertension    Prediabetes    Prostate cancer (HCC) DX 12/19/11   bx=Adenocarcinoma,gleason=3+3=6,volume=42cc,PSA=4.63   PTSD (post-traumatic stress disorder)    Pulmonary nodules 12/10/2018   Numerous unchanged/benign appearing per CT lung 12/2018   Sleep apnea    no cpap   Vitamin D deficiency     Past Surgical History:  Procedure Laterality Date   COLONOSCOPY W/ POLYPECTOMY     Adenomatous   CYSTOSCOPY N/A 04/04/2012   Procedure: CYSTOSCOPY;  Surgeon: Milford Cage, MD;  Location: [facility];  Service: Urology;  Laterality: N/A;   EYE SURGERY     bilateral cataracts    INGUINAL HERNIA REPAIR Bilateral 10/18/2018   Procedure: LAPAROSCOPIC BILATERAL INGUINAL HERNIA REPAIR;  Surgeon: Luretha Murphy, MD;  Location: [facility];  Service: General;  Laterality: Bilateral;   PROSTATE BIOPSY  12/19/2011   Procedure: BIOPSY TRANSRECTAL ULTRASONIC PROSTATE (TUBP);  Surgeon: Milford Cage, MD;  Location: [facility];  Service: Urology;  Laterality: N/A;     RADIOACTIVE SEED IMPLANT N/A 04/04/2012   Procedure: RADIOACTIVE SEED IMPLANT;  Surgeon: Milford Cage, MD;  Location:  SURGERY CENTER;  Service: Urology;  Laterality: N/A;  Seeds Implanted     72  Seeds Found in Bladder     None     MEDICATIONS:  Cholecalciferol (VITAMIN D3) 125 MCG (5000 UT) TABS   cloNIDine (CATAPRES) 0.2 MG tablet   ezetimibe (ZETIA) 10 MG tablet   famotidine (PEPCID) 20 MG tablet   fenofibrate micronized (LOFIBRA) 134 MG capsule   Magnesium 400 MG CAPS   Omega-3 Fatty Acids (FISH OIL) 1000 MG CAPS   omeprazole (PRILOSEC) 40 MG capsule   propranolol ER (INDERAL LA) 120 MG 24 hr capsule   rosuvastatin (CRESTOR) 40 MG tablet   No current facility-administered medications for this encounter.    Marcille Blanco [facility]  Short Stay/Anesthesiology [facility] Phone [phone] 04/19/2023 11:07 AM

## 2023-04-20 ENCOUNTER — Other Ambulatory Visit (HOSPITAL_COMMUNITY): Payer: Self-pay

## 2023-04-25 ENCOUNTER — Ambulatory Visit (HOSPITAL_COMMUNITY): Payer: Self-pay | Admitting: Medical

## 2023-04-25 ENCOUNTER — Ambulatory Visit (HOSPITAL_COMMUNITY)
Admission: RE | Admit: 2023-04-25 | Discharge: 2023-04-25 | Disposition: A | Discharge status: Home / Self Care | Payer: Medicare HMO | Source: Ambulatory Visit | Attending: Surgery | Admitting: Surgery

## 2023-04-25 ENCOUNTER — Encounter (HOSPITAL_COMMUNITY): Payer: Self-pay | Admitting: Surgery

## 2023-04-25 ENCOUNTER — Other Ambulatory Visit (HOSPITAL_COMMUNITY): Payer: Self-pay

## 2023-04-25 ENCOUNTER — Other Ambulatory Visit: Payer: Self-pay

## 2023-04-25 ENCOUNTER — Encounter (HOSPITAL_COMMUNITY)
Admission: RE | Disposition: A | Discharge status: Home / Self Care | Payer: Self-pay | Source: Ambulatory Visit | Attending: Surgery

## 2023-04-25 ENCOUNTER — Ambulatory Visit (HOSPITAL_BASED_OUTPATIENT_CLINIC_OR_DEPARTMENT_OTHER): Admitting: Certified Registered Nurse Anesthetist

## 2023-04-25 DIAGNOSIS — E785 Hyperlipidemia, unspecified: Secondary | ICD-10-CM | POA: Diagnosis not present

## 2023-04-25 DIAGNOSIS — K4091 Unilateral inguinal hernia, without obstruction or gangrene, recurrent: Secondary | ICD-10-CM | POA: Diagnosis present

## 2023-04-25 DIAGNOSIS — K219 Gastro-esophageal reflux disease without esophagitis: Secondary | ICD-10-CM | POA: Diagnosis not present

## 2023-04-25 DIAGNOSIS — F17290 Nicotine dependence, other tobacco product, uncomplicated: Secondary | ICD-10-CM | POA: Diagnosis not present

## 2023-04-25 DIAGNOSIS — I129 Hypertensive chronic kidney disease with stage 1 through stage 4 chronic kidney disease, or unspecified chronic kidney disease: Secondary | ICD-10-CM | POA: Insufficient documentation

## 2023-04-25 DIAGNOSIS — G4730 Sleep apnea, unspecified: Secondary | ICD-10-CM | POA: Diagnosis not present

## 2023-04-25 DIAGNOSIS — N189 Chronic kidney disease, unspecified: Secondary | ICD-10-CM | POA: Insufficient documentation

## 2023-04-25 DIAGNOSIS — J449 Chronic obstructive pulmonary disease, unspecified: Secondary | ICD-10-CM | POA: Diagnosis not present

## 2023-04-25 DIAGNOSIS — Z8546 Personal history of malignant neoplasm of prostate: Secondary | ICD-10-CM | POA: Insufficient documentation

## 2023-04-25 DIAGNOSIS — K4090 Unilateral inguinal hernia, without obstruction or gangrene, not specified as recurrent: Secondary | ICD-10-CM

## 2023-04-25 HISTORY — PX: INGUINAL HERNIA REPAIR: SHX194

## 2023-04-25 SURGERY — REPAIR, HERNIA, INGUINAL, ADULT
Anesthesia: General | Laterality: Right

## 2023-04-25 MED ORDER — SUGAMMADEX SODIUM 200 MG/2ML IV SOLN
INTRAVENOUS | Status: AC
  Filled 2023-04-25: qty 2

## 2023-04-25 MED ORDER — PROPOFOL 10 MG/ML IV BOLUS
INTRAVENOUS | Status: DC | PRN
  Administered 2023-04-25: 150 mg via INTRAVENOUS

## 2023-04-25 MED ORDER — PHENYLEPHRINE 80 MCG/ML (10ML) SYRINGE FOR IV PUSH (FOR BLOOD PRESSURE SUPPORT)
PREFILLED_SYRINGE | INTRAVENOUS | Status: AC
  Filled 2023-04-25: qty 10

## 2023-04-25 MED ORDER — ONDANSETRON HCL 4 MG/2ML IJ SOLN
INTRAMUSCULAR | Status: AC
Start: 2023-04-25 — End: ?
  Filled 2023-04-25: qty 2

## 2023-04-25 MED ORDER — OXYCODONE HCL 5 MG PO TABS
5.0000 mg | ORAL_TABLET | Freq: Three times a day (TID) | ORAL | 0 refills | Status: DC | PRN
  Filled 2023-04-25: qty 15, 5d supply, fill #0

## 2023-04-25 MED ORDER — CEFAZOLIN SODIUM-DEXTROSE 2-4 GM/100ML-% IV SOLN
2.0000 g | INTRAVENOUS | Status: AC
  Administered 2023-04-25: 2 g via INTRAVENOUS
  Filled 2023-04-25: qty 100

## 2023-04-25 MED ORDER — EPHEDRINE SULFATE-NACL 50-0.9 MG/10ML-% IV SOSY
PREFILLED_SYRINGE | INTRAVENOUS | Status: DC | PRN
Start: 2023-04-25 — End: 2023-04-25
  Administered 2023-04-25 (×2): 5 mg via INTRAVENOUS
  Administered 2023-04-25: 10 mg via INTRAVENOUS

## 2023-04-25 MED ORDER — ROCURONIUM BROMIDE 10 MG/ML (PF) SYRINGE
PREFILLED_SYRINGE | INTRAVENOUS | Status: DC | PRN
  Administered 2023-04-25: 50 mg via INTRAVENOUS
  Administered 2023-04-25: 10 mg via INTRAVENOUS
  Administered 2023-04-25 (×2): 20 mg via INTRAVENOUS

## 2023-04-25 MED ORDER — BUPIVACAINE LIPOSOME 1.3 % IJ SUSP: 20.0000 mL | Freq: Once | INTRAMUSCULAR | Status: DC

## 2023-04-25 MED ORDER — CHLORHEXIDINE GLUCONATE 4 % EX SOLN: 60.0000 mL | Freq: Once | CUTANEOUS | Status: DC

## 2023-04-25 MED ORDER — DOCUSATE SODIUM 100 MG PO CAPS
100.0000 mg | ORAL_CAPSULE | Freq: Two times a day (BID) | ORAL | 0 refills | Status: DC
  Filled 2023-04-25: qty 30, 15d supply, fill #0

## 2023-04-25 MED ORDER — PHENYLEPHRINE 80 MCG/ML (10ML) SYRINGE FOR IV PUSH (FOR BLOOD PRESSURE SUPPORT)
PREFILLED_SYRINGE | INTRAVENOUS | Status: DC | PRN
  Administered 2023-04-25: 160 ug via INTRAVENOUS

## 2023-04-25 MED ORDER — OXYCODONE HCL 5 MG PO TABS
5.0000 mg | ORAL_TABLET | Freq: Three times a day (TID) | ORAL | 0 refills | Status: AC | PRN
  Filled 2023-04-25: qty 15, 5d supply, fill #0

## 2023-04-25 MED ORDER — CELECOXIB 200 MG PO CAPS
200.0000 mg | ORAL_CAPSULE | Freq: Once | ORAL | Status: AC
  Administered 2023-04-25: 200 mg via ORAL
  Filled 2023-04-25: qty 1

## 2023-04-25 MED ORDER — ONDANSETRON HCL 4 MG/2ML IJ SOLN
INTRAMUSCULAR | Status: DC | PRN
  Administered 2023-04-25: 4 mg via INTRAVENOUS

## 2023-04-25 MED ORDER — FENTANYL CITRATE (PF) 250 MCG/5ML IJ SOLN
INTRAMUSCULAR | Status: AC
  Filled 2023-04-25: qty 5

## 2023-04-25 MED ORDER — DOCUSATE SODIUM 100 MG PO CAPS
100.0000 mg | ORAL_CAPSULE | Freq: Two times a day (BID) | ORAL | 0 refills | Status: AC
  Filled 2023-04-25: qty 30, 15d supply, fill #0

## 2023-04-25 MED ORDER — PROPOFOL 10 MG/ML IV BOLUS
INTRAVENOUS | Status: AC
  Filled 2023-04-25: qty 20

## 2023-04-25 MED ORDER — AMISULPRIDE (ANTIEMETIC) 5 MG/2ML IV SOLN: 10.0000 mg | Freq: Once | INTRAVENOUS | Status: DC | PRN

## 2023-04-25 MED ORDER — ACETAMINOPHEN 500 MG PO TABS: 1000.0000 mg | ORAL_TABLET | ORAL | Status: DC

## 2023-04-25 MED ORDER — LIDOCAINE HCL (PF) 2 % IJ SOLN
INTRAMUSCULAR | Status: DC | PRN
  Administered 2023-04-25: 60 mg via INTRADERMAL

## 2023-04-25 MED ORDER — HYDROMORPHONE HCL 1 MG/ML IJ SOLN: 0.2500 mg | INTRAMUSCULAR | Status: DC | PRN

## 2023-04-25 MED ORDER — ACETAMINOPHEN 500 MG PO TABS
1000.0000 mg | ORAL_TABLET | Freq: Once | ORAL | Status: AC
  Administered 2023-04-25: 1000 mg via ORAL
  Filled 2023-04-25: qty 2

## 2023-04-25 MED ORDER — BUPIVACAINE LIPOSOME 1.3 % IJ SUSP
INTRAMUSCULAR | Status: AC
  Filled 2023-04-25: qty 20

## 2023-04-25 MED ORDER — DEXAMETHASONE SODIUM PHOSPHATE 10 MG/ML IJ SOLN
INTRAMUSCULAR | Status: DC | PRN
  Administered 2023-04-25: 10 mg via INTRAVENOUS

## 2023-04-25 MED ORDER — BUPIVACAINE-EPINEPHRINE (PF) 0.25% -1:200000 IJ SOLN
INTRAMUSCULAR | Status: AC
  Filled 2023-04-25: qty 30

## 2023-04-25 MED ORDER — 0.9 % SODIUM CHLORIDE (POUR BTL) OPTIME
TOPICAL | Status: DC | PRN
Start: 2023-04-25 — End: 2023-04-25
  Administered 2023-04-25: 1000 mL

## 2023-04-25 MED ORDER — LACTATED RINGERS IV SOLN: INTRAVENOUS | Status: DC

## 2023-04-25 MED ORDER — LIDOCAINE HCL (PF) 2 % IJ SOLN
INTRAMUSCULAR | Status: AC
  Filled 2023-04-25: qty 5

## 2023-04-25 MED ORDER — EPHEDRINE 5 MG/ML INJ
INTRAVENOUS | Status: AC
  Filled 2023-04-25: qty 5

## 2023-04-25 MED ORDER — FENTANYL CITRATE (PF) 250 MCG/5ML IJ SOLN
INTRAMUSCULAR | Status: DC | PRN
  Administered 2023-04-25 (×2): 50 ug via INTRAVENOUS
  Administered 2023-04-25: 100 ug via INTRAVENOUS
  Administered 2023-04-25: 50 ug via INTRAVENOUS

## 2023-04-25 MED ORDER — ORAL CARE MOUTH RINSE: 15.0000 mL | Freq: Once | OROMUCOSAL | Status: AC

## 2023-04-25 MED ORDER — BUPIVACAINE LIPOSOME 1.3 % IJ SUSP
INTRAMUSCULAR | Status: DC | PRN
  Administered 2023-04-25: 20 mL

## 2023-04-25 MED ORDER — BUPIVACAINE-EPINEPHRINE 0.25% -1:200000 IJ SOLN
INTRAMUSCULAR | Status: DC | PRN
  Administered 2023-04-25: 30 mL

## 2023-04-25 MED ORDER — SUGAMMADEX SODIUM 200 MG/2ML IV SOLN
INTRAVENOUS | Status: DC | PRN
  Administered 2023-04-25: 300 mg via INTRAVENOUS

## 2023-04-25 MED ORDER — ROCURONIUM BROMIDE 10 MG/ML (PF) SYRINGE
PREFILLED_SYRINGE | INTRAVENOUS | Status: AC
  Filled 2023-04-25: qty 10

## 2023-04-25 MED ORDER — CHLORHEXIDINE GLUCONATE 0.12 % MT SOLN
15.0000 mL | Freq: Once | OROMUCOSAL | Status: AC
  Administered 2023-04-25: 15 mL via OROMUCOSAL

## 2023-04-25 MED ORDER — DEXAMETHASONE SODIUM PHOSPHATE 10 MG/ML IJ SOLN
INTRAMUSCULAR | Status: AC
  Filled 2023-04-25: qty 1

## 2023-04-25 MED ORDER — GABAPENTIN 300 MG PO CAPS
300.0000 mg | ORAL_CAPSULE | ORAL | Status: AC
  Administered 2023-04-25: 300 mg via ORAL
  Filled 2023-04-25: qty 1

## 2023-04-25 SURGICAL SUPPLY — 30 items
BAG COUNTER SPONGE SURGICOUNT (BAG) IMPLANT
BENZOIN TINCTURE PRP APPL 2/3 (GAUZE/BANDAGES/DRESSINGS) ×1 IMPLANT
BLADE SURG 15 STRL LF DISP TIS (BLADE) ×1 IMPLANT
CHLORAPREP W/TINT 26 (MISCELLANEOUS) ×1 IMPLANT
COVER SURGICAL LIGHT HANDLE (MISCELLANEOUS) ×1 IMPLANT
DRAIN PENROSE 0.5X18 (DRAIN) ×1 IMPLANT
DRAPE LAPAROSCOPIC ABDOMINAL (DRAPES) ×1 IMPLANT
ELECT REM PT RETURN 15FT ADLT (MISCELLANEOUS) ×1 IMPLANT
GAUZE SPONGE 4X4 12PLY STRL (GAUZE/BANDAGES/DRESSINGS) IMPLANT
GLOVE BIO SURGEON STRL SZ 6 (GLOVE) ×1 IMPLANT
GLOVE INDICATOR 6.5 STRL GRN (GLOVE) ×1 IMPLANT
GOWN STRL REUS W/ TWL LRG LVL3 (GOWN DISPOSABLE) ×1 IMPLANT
KIT BASIN OR (CUSTOM PROCEDURE TRAY) ×1 IMPLANT
KIT TURNOVER KIT A (KITS) IMPLANT
MARKER SKIN DUAL TIP RULER LAB (MISCELLANEOUS) ×1 IMPLANT
MESH ULTRAPRO 3X6 7.6X15CM (Mesh General) IMPLANT
NDL HYPO 22X1.5 SAFETY MO (MISCELLANEOUS) ×1 IMPLANT
NEEDLE HYPO 22X1.5 SAFETY MO (MISCELLANEOUS) ×1 IMPLANT
PACK GENERAL/GYN (CUSTOM PROCEDURE TRAY) ×1 IMPLANT
SPIKE FLUID TRANSFER (MISCELLANEOUS) ×1 IMPLANT
STRIP CLOSURE SKIN 1/2X4 (GAUZE/BANDAGES/DRESSINGS) ×1 IMPLANT
SUT ETHIBOND 0 MO6 C/R (SUTURE) ×1 IMPLANT
SUT MNCRL AB 4-0 PS2 18 (SUTURE) ×1 IMPLANT
SUT PDS AB 0 CT1 36 (SUTURE) ×2 IMPLANT
SUT VIC AB 0 CT2 27 (SUTURE) IMPLANT
SUT VIC AB 3-0 SH 27XBRD (SUTURE) ×2 IMPLANT
SUT VICRYL 3 0 BR 18 UND (SUTURE) ×1 IMPLANT
SYR CONTROL 10ML LL (SYRINGE) ×1 IMPLANT
TAPE STRIPS DRAPE STRL (GAUZE/BANDAGES/DRESSINGS) IMPLANT
TOWEL OR 17X26 10 PK STRL BLUE (TOWEL DISPOSABLE) ×1 IMPLANT

## 2023-04-25 NOTE — Op Note (Signed)
 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 The groin was clipped, prepped and draped in the usual sterile fashion. An oblique incision was made the just above the inguinal ligament after infiltrating the tissues with local anesthetic (exparel mixed with 0.25% marcaine with epinephrine). Soft tissues were dissected using electrocautery until the external oblique aponeurosis was encountered. This was divided sharply to expand the external ring. A plane was bluntly developed between the spermatic cord and the external oblique. The ilioinguinal nerve was divided between hemostats and ligated with 3-0 vicryl ties. The spermatic cord was then bluntly dissected away from the pubic tubercle and encircled with a Penrose. Inspection of the inguinal anatomy revealed a recurrent indirect hernia sac with expected surrounding thickened cremasteric/scar tissue, which was densely adherent to the cord structures.  The overlying scar and cremaster tissues were carefully dissected away exposing the indirect hernia sac and cord structures.  The indirect hernia sac was carefully dissected away from the cord structures and skeletonized to the level of the internal ring. There was a tear in the hernia sac which was closed with a 0 vicryl running suture. The indirect hernia sac was then reduced into  the abdomen.  There was firmness consistent with prior mesh superiorly along the abdominal wall. The inguinal floor was reconstructed suturing the conjoint tendon to the inguinal ligament with interrupted 0 PDS, leaving an internal ring just sufficient for the cord structures which were confirmed to be intact. A 3 x 6 piece of ultra Pro mesh was brought onto the field and trimmed slightly. This was sutured to the pubic tubercle fascia, inferior shelving edge and to the internal oblique superiorly with interrupted 0 ethibonds. The tails of the mesh were wrapped around the spermatic cord, ensuring adequate room for the cord, and sutured to each other with 0 ethibond, and then directed laterally to lie flat beneath the external oblique aponeurosis.  An additional suture was placed medially to reinforce the slit in the mesh. Hemostasis was ensured within the wound. The Penrose was removed. The external oblique aponeurosis was reapproximated with a running 3-0 Vicryl to re-create a narrowed external ring. More local was infiltrated around the pubic tubercle and in the plane just below the external oblique. The Scarpa's was reapproximated with interrupted 3-0 Vicryls. The skin was closed with a running subcuticular 4-0 Monocryl. The remainder of the local was injected in the subcutaneous and subcuticular space. The field was then cleaned, benzoin and Steri-Strips and sterile bandage were applied. The patient was then awakened extubated and taken to PACU in stable condition.    All counts were correct at the completion of the case

## 2023-04-25 NOTE — Interval H&P Note (Signed)
 History and Physical Interval Note:  04/25/2023 12:14 PM  Juan Torres  has presented today for surgery, with the diagnosis of RECURRENT RIGHT INGUINAL HERNIA.  The various methods of treatment have been discussed with the patient and family. After consideration of risks, benefits and other options for treatment, the patient has consented to  Procedure(s): OPEN RIGHT INGUINAL HERNIA REPAIR WITH MESH (Right) as a surgical intervention.  The patient's history has been reviewed, patient examined, no change in status, stable for surgery.  I have reviewed the patient's chart and labs.  Questions were answered to the patient's satisfaction.     Timoty Bourke Lollie Sails

## 2023-04-25 NOTE — Anesthesia Procedure Notes (Signed)
 Procedure Name: Intubation Date/Time: 04/25/2023 12:55 PM  Performed by: Orest Dikes, CRNAPre-anesthesia Checklist: Patient identified, Emergency Drugs available, Suction available and Patient being monitored Patient Re-evaluated:Patient Re-evaluated prior to induction Oxygen Delivery Method: Circle system utilized Preoxygenation: Pre-oxygenation with 100% oxygen Induction Type: IV induction Ventilation: Mask ventilation without difficulty Laryngoscope Size: Mac and 4 Grade View: Grade I Tube type: Oral Tube size: 7.5 mm Number of attempts: 1 Airway Equipment and Method: Stylet Placement Confirmation: ETT inserted through vocal cords under direct vision, positive ETCO2 and breath sounds checked- equal and bilateral Secured at: 22 cm Tube secured with: Tape Dental Injury: Teeth and Oropharynx as per pre-operative assessment

## 2023-04-25 NOTE — Discharge Instructions (Signed)
 HERNIA REPAIR: POST OP INSTRUCTIONS   EAT Gradually transition to a high fiber diet with a fiber supplement over the next few weeks after discharge.  Start with a pureed / full liquid diet (see below)  WALK Walk an hour a day (cumulative- not all at once).  Control your pain to do that.    CONTROL PAIN Control pain so that you can walk, sleep, tolerate sneezing/coughing, and go up/down stairs.  HAVE A BOWEL MOVEMENT DAILY Keep your bowels regular to avoid problems.  OK to try a laxative to override constipation.  OK to use an antidiarrheal to slow down diarrhea.  Call if not better after 2 tries  CALL IF YOU HAVE PROBLEMS/CONCERNS Call if you are still struggling despite following these instructions. Call if you have concerns not answered by these instructions  ######################################################################    DIET: Follow a light bland diet & liquids the first 24 hours after arrival home, such as soup, liquids, starches, etc.  Be sure to drink plenty of fluids.  Quickly advance to a usual solid diet within a few days.  Avoid fast food or heavy meals initially as you are more likely to get nauseated or have irregular bowels.  Take your usually prescribed home medications unless otherwise directed.  PAIN CONTROL: Pain is best controlled by a usual combination of three different methods TOGETHER: Ice/Heat Over the counter pain medication Prescription pain medication Most patients will experience some swelling and bruising around the hernia(s) such as the bellybutton, groins, or old incisions.  Ice packs or heating pads (30-60 minutes up to 6 times a day) will help. Use ice for the first few days to help decrease swelling and bruising, then switch to heat to help relax tight/sore spots and speed recovery.  Some people prefer to use ice alone, heat alone, alternating between ice & heat.  Experiment to what works for you.  Swelling and bruising can take several  weeks to resolve.   It is helpful to take an over-the-counter pain medication regularly for the first days: Naproxen (Aleve, etc)  Two 220mg  tabs twice a day OR Ibuprofen (Advil, etc) Three 200mg  tabs four times a day (every meal & bedtime) AND Acetaminophen (Tylenol, etc) 325-650mg  four times a day (every meal & bedtime) A  prescription for pain medication should be given to you upon discharge.  Take your pain medication as prescribed, IF NEEDED.  If you are having problems/concerns with the prescription medicine (does not control pain, nausea, vomiting, rash, itching, etc), please call us [phone] to see if we need to switch you to a different pain medicine that will work better for you and/or control your side effect better. If you need a refill on your pain medication, please contact your pharmacy.  They will contact our office to request authorization. Prescriptions will not be filled after 5 pm or on week-ends.  Avoid getting constipated.  Between the surgery and the pain medications, it is common to experience some constipation.  Increasing fluid intake and taking a fiber supplement (such as Metamucil, Citrucel, FiberCon, MiraLax, etc) 1-2 times a day regularly will usually help prevent this problem from occurring.  A mild laxative (prune juice, Milk of Magnesia, MiraLax, etc) should be taken according to package directions if there are no bowel movements after 48 hours.    Wash / shower every day, starting 2 days after surgery.  You may shower over the steri strips which are waterproof.  No rubbing, scrubbing, lotions or ointments to  incision(s). Do not soak or submerge.   Remove your outer bandage 2 days after surgery. Steri strips (small white tapes) will peel off after 1-2 weeks. You may leave the incision open to air.  You may replace a dressing/Band-Aid to cover an incision for comfort if you wish.  Continue to shower over incision(s) after the dressing is off.  ACTIVITIES as  tolerated:   You may resume regular (light) daily activities beginning the next day--such as daily self-care, walking, climbing stairs--gradually increasing activities as tolerated.  Control your pain so that you can walk an hour a day.  If you can walk 30 minutes without difficulty, it is safe to try more intense activity such as jogging, treadmill, bicycling, low-impact aerobics, swimming, etc. Refrain from the most intensive and strenuous activity such as sit-ups, heavy lifting, contact sports, etc  Refrain from any heavy lifting or straining until 6 weeks after surgery.   DO NOT PUSH THROUGH PAIN.  Let pain be your guide: If it hurts to do something, don't do it.  Pain is your body warning you to avoid that activity for another week until the pain goes down. You may drive when you are no longer taking prescription pain medication, you can comfortably wear a seatbelt, and you can safely maneuver your car and apply brakes. You may have sexual intercourse when it is comfortable.   FOLLOW UP in our office Please call [facility] at [phone] to set up an appointment to see your surgeon in the office for a follow-up appointment approximately 2-3 weeks after your surgery. Make sure that you call for this appointment the day you arrive home to insure a convenient appointment time.  9.  If you have disability of FMLA / Family leave forms, please bring the forms to the office for processing.  (do not give to your surgeon).  WHEN TO CALL us [phone]: Poor pain control Reactions / problems with new medications (rash/itching, nausea, etc)  Fever over 101.5 F (38.5 C) Inability to urinate Nausea and/or vomiting Worsening swelling or bruising Continued bleeding from incision. Increased pain, redness, or drainage from the incision   The clinic staff is available to answer your questions during regular business hours (8:30am-5pm).  Please don't hesitate to call and ask to speak to one of our nurses for  clinical concerns.   If you have a medical emergency, go to the nearest emergency room or call 911.  A surgeon from [facility] is always on call at the hospitals in [facility], [place] [address], [address], [place], [place]  [address] ?  [address] [address], [place], [place]   [address] MAIN: [phone] ? TOLL FREE: [phone] ? FAX: [phone] [email or web address]

## 2023-04-25 NOTE — Transfer of Care (Signed)
 Immediate Anesthesia Transfer of Care Note  Patient: Juan Torres  Procedure(s) Performed: OPEN RIGHT INGUINAL HERNIA REPAIR WITH MESH (Right)  Patient Location: PACU  Anesthesia Type:General  Level of Consciousness: awake, alert , and oriented  Airway & Oxygen Therapy: Patient Spontanous Breathing and Patient connected to face mask oxygen  Post-op Assessment: Report given to RN and Post -op Vital signs reviewed and stable  Post vital signs: Reviewed and stable  Last Vitals:  Vitals Value Taken Time  BP    Temp    Pulse    Resp    SpO2      Last Pain:  Vitals:   04/25/23 1204  TempSrc: Oral  PainSc:          Complications: No notable events documented.

## 2023-04-26 ENCOUNTER — Encounter (HOSPITAL_COMMUNITY): Payer: Self-pay | Admitting: Surgery

## 2023-04-26 NOTE — Anesthesia Postprocedure Evaluation (Signed)
 Anesthesia Post Note  Patient: Juan Torres  Procedure(s) Performed: OPEN RIGHT INGUINAL HERNIA REPAIR WITH MESH (Right)     Patient location during evaluation: PACU Anesthesia Type: General Level of consciousness: awake and alert Pain management: pain level controlled Vital Signs Assessment: post-procedure vital signs reviewed and stable Respiratory status: spontaneous breathing, nonlabored ventilation, respiratory function stable and patient connected to nasal cannula oxygen Cardiovascular status: blood pressure returned to baseline and stable Postop Assessment: no apparent nausea or vomiting Anesthetic complications: no   No notable events documented.  Last Vitals:  Vitals:   04/25/23 1545 04/25/23 1600  BP: 115/68 133/69  Pulse: (!) 46 (!) 50  Resp:    Temp:    SpO2: 100% 100%    Last Pain:  Vitals:   04/25/23 1528  TempSrc:   PainSc: 0-No pain                 Earl Lites P Nissi Doffing

## 2023-05-04 ENCOUNTER — Encounter: Payer: Self-pay | Admitting: Internal Medicine

## 2023-05-04 ENCOUNTER — Ambulatory Visit: Payer: Medicare HMO | Admitting: Internal Medicine

## 2023-05-04 VITALS — BP 124/76 | HR 83 | Temp 98.0°F | Resp 16 | Ht 72.0 in | Wt 157.8 lb

## 2023-05-04 DIAGNOSIS — K760 Fatty (change of) liver, not elsewhere classified: Secondary | ICD-10-CM | POA: Diagnosis not present

## 2023-05-04 DIAGNOSIS — F1010 Alcohol abuse, uncomplicated: Secondary | ICD-10-CM

## 2023-05-04 DIAGNOSIS — D696 Thrombocytopenia, unspecified: Secondary | ICD-10-CM | POA: Diagnosis not present

## 2023-05-04 DIAGNOSIS — F1027 Alcohol dependence with alcohol-induced persisting dementia: Secondary | ICD-10-CM

## 2023-05-04 DIAGNOSIS — I10 Essential (primary) hypertension: Secondary | ICD-10-CM | POA: Diagnosis not present

## 2023-05-04 LAB — CBC WITH DIFFERENTIAL/PLATELET
Basophils Absolute: 0.1 10*3/uL (ref 0.0–0.1)
Basophils Relative: 1.5 % (ref 0.0–3.0)
Eosinophils Absolute: 0.2 10*3/uL (ref 0.0–0.7)
Eosinophils Relative: 3.9 % (ref 0.0–5.0)
HCT: 42.2 % (ref 39.0–52.0)
Hemoglobin: 14.7 g/dL (ref 13.0–17.0)
Lymphocytes Relative: 26.5 % (ref 12.0–46.0)
Lymphs Abs: 1.4 10*3/uL (ref 0.7–4.0)
MCHC: 34.8 g/dL (ref 30.0–36.0)
MCV: 107.4 fl — ABNORMAL HIGH (ref 78.0–100.0)
Monocytes Absolute: 0.5 10*3/uL (ref 0.1–1.0)
Monocytes Relative: 9.1 % (ref 3.0–12.0)
Neutro Abs: 3.2 10*3/uL (ref 1.4–7.7)
Neutrophils Relative %: 59 % (ref 43.0–77.0)
Platelets: 271 10*3/uL (ref 150.0–400.0)
RBC: 3.93 Mil/uL — ABNORMAL LOW (ref 4.22–5.81)
RDW: 12.9 % (ref 11.5–15.5)
WBC: 5.4 10*3/uL (ref 4.0–10.5)

## 2023-05-04 LAB — HEPATIC FUNCTION PANEL
ALT: 11 U/L (ref 0–53)
AST: 21 U/L (ref 0–37)
Albumin: 3.7 g/dL (ref 3.5–5.2)
Alkaline Phosphatase: 28 U/L — ABNORMAL LOW (ref 39–117)
Bilirubin, Direct: 0.1 mg/dL (ref 0.0–0.3)
Total Bilirubin: 0.4 mg/dL (ref 0.2–1.2)
Total Protein: 6 g/dL (ref 6.0–8.3)

## 2023-05-04 LAB — PROTIME-INR
INR: 1 ratio (ref 0.8–1.0)
Prothrombin Time: 10.4 s (ref 9.6–13.1)

## 2023-05-04 LAB — MAGNESIUM: Magnesium: 1.7 mg/dL (ref 1.5–2.5)

## 2023-05-04 NOTE — Progress Notes (Unsigned)
 Subjective:  Patient ID: Juan Torres, male    DOB: [birth year]  Age: 78 y.o. MRN: [id number]  CC: Hypertension   HPI Juan Torres presents for establishing...  Discussed the use of AI scribe software for clinical note transcription with the patient, who gave verbal consent to proceed.  History of Present Illness   Juan Torres is a 78 year old male who presents for medication management and follow-up after hernia surgery.  He is managing hypertension with regular medication and uses clonidine as a rescue medication when blood pressure exceeds 160 mmHg. An accidental intake of clonidine resulted in hypotension with a blood pressure of 75/52, causing weakness. No symptoms occur when blood pressure is high. 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  He recently underwent surgery for a recurrent right inguinal hernia, repaired with stitches to avoid previous mesh complications. He experienced no postoperative complications and was able to walk up stairs shortly after the procedure. 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 He has reduced his alcohol intake, now consuming about three beers and one or two glasses of wine per day, and has stopped drinking vodka. He is hesitant to seek help due to fear of losing gun rights and does not wish to quit beer.  He smokes four to five cigars a day and occasionally smokes cigarettes. No chest pain, shortness of breath, dizziness, or lightheadedness. He reports easy bruising, which he attributes to carrying wood, but denies any bleeding.  His magnesium levels were low prior to surgery, prompting increased intake. Platelets were low at 101,000/L before surgery, but he reports no bleeding issues. His last EKG was a year ago, and he has a dermatology appointment  scheduled for April 1st.       History Juan Torres has a past medical history of Adenomatous colon polyp, Anxiety, Arthritis, COPD (chronic obstructive pulmonary disease) (HCC), Depression, Familial tremor, GERD (gastroesophageal reflux disease), Headache, History of basal cell cancer (2012), Hyperlipidemia, Hypertension, Prediabetes, Prostate cancer (HCC) (DX 12/19/11), PTSD (post-traumatic stress disorder), Pulmonary nodules (12/10/2018), Sleep apnea, and Vitamin D deficiency.   He has a past surgical history that includes Prostate biopsy (12/19/2011); Colonoscopy w/ polypectomy; Radioactive seed implant (N/A, 04/04/2012); Cystoscopy (N/A, 04/04/2012); Eye surgery; Inguinal hernia repair (Bilateral, 10/18/2018); and Inguinal hernia repair (Right, 04/25/2023).   His family history includes Breast cancer in his mother; Cancer in his maternal grandfather; Dementia in his father; Heart attack (age of onset: 26) in his father; Prostate cancer in his cousin and maternal uncle; Ulcers in his maternal grandmother.He reports that he has been smoking cigarettes and cigars. He started smoking about 34 years ago. He has a 30 pack-year smoking history. He has never used smokeless tobacco. He reports current alcohol use of about 8.0 standard drinks of alcohol per week. He reports that he does not currently use drugs after having used the following drugs: Marijuana. Frequency: 7.00 times per week.  Outpatient Medications Prior to Visit  Medication Sig Dispense Refill   Cholecalciferol (VITAMIN D3) 125 MCG (5000 UT) TABS Take 10,000 Units by mouth every morning.     docusate sodium (COLACE) 100 MG capsule Take 1 capsule (100 mg total) by mouth 2 (two) times daily. Okay to decrease to once daily or stop taking if  having loose bowel movements. 30 capsule 0   ezetimibe (ZETIA) 10 MG tablet Take 1 tablet (10 mg total) by mouth daily for cholesterol. 90 tablet 3   famotidine (PEPCID) 20 MG tablet Take 1 tablet (20 mg total) by  mouth 2 (two) times daily; before breakfast and before dinner 180 tablet 0   fenofibrate micronized (LOFIBRA) 134 MG capsule Take 1 capsule (134 mg total) by mouth daily (blood fats). 90 capsule 3   Magnesium 400 MG CAPS Take 400 mg by mouth every evening.      Omega-3 Fatty Acids (FISH OIL) 1000 MG CAPS Take 1,000 mg by mouth every morning.     omeprazole (PRILOSEC) 40 MG capsule Take 1 capsule (40 mg total) by mouth daily to prevent indigestion & heartburn 90 capsule 0   propranolol ER (INDERAL LA) 120 MG 24 hr capsule Take 1 capsule (120 mg total) by mouth daily. For blood pressure and tremor 90 capsule 3   rosuvastatin (CRESTOR) 40 MG tablet Take 1 tablet (40 mg total) by mouth daily for cholesterol 90 tablet 0   cloNIDine (CATAPRES) 0.2 MG tablet Take 1 tablet (0.2 mg total) by mouth daily as needed. For BP >160/90 60 tablet 2   No facility-administered medications prior to visit.    ROS Review of Systems  Objective:  BP 124/76 (BP Location: Left Arm, Patient Position: Sitting, Cuff Size: Small)   Pulse 83   Temp 98 F (36.7 C) (Oral)   Resp 16   Ht 6' (1.829 m)   Wt 157 lb 12.8 oz (71.6 kg)   SpO2 97%   BMI 21.40 kg/m   Physical Exam Cardiovascular:     Rate and Rhythm: Normal rate and regular rhythm.     Heart sounds: Normal heart sounds, S1 normal and S2 normal. No murmur heard.    Comments: EKG- SR with SA, 71 bpm No LVH, Q waves, or ST/T wave changes  Musculoskeletal:     Right lower leg: No edema.     Left lower leg: No edema.     Lab Results  Component Value Date   WBC 5.4 04/18/2023   HGB 15.5 04/18/2023   HCT 42.9 04/18/2023   PLT 101 (L) 04/18/2023   GLUCOSE 112 (H) 04/18/2023   CHOL 138 12/01/2022   TRIG 113 12/01/2022   HDL 56 12/01/2022   LDLCALC 62 12/01/2022   ALT 14 12/01/2022   AST 25 12/01/2022   NA 139 04/18/2023   K 3.7 04/18/2023   CL 106 04/18/2023   CREATININE 1.24 04/18/2023   BUN 9 04/18/2023   CO2 23 04/18/2023   TSH 3.26  12/01/2022   PSA <0.04 05/31/2022   INR 0.97 03/28/2012   HGBA1C 5.6 12/01/2022   MICROALBUR <0.2 05/31/2022     Assessment & Plan:  Essential hypertension -     EKG 12-Lead  Excessive drinking of alcohol  Hepatic steatosis -     Hepatic function panel; Future -     Protime-INR; Future  Thrombocytopenia (HCC) -     CBC with Differential/Platelet; Future -     Folate; Future -     Zinc; Future -     Vitamin B1; Future -     Vitamin B12; Future  Hypomagnesemia -     Magnesium; Future      Follow-up: Return in about 3 months (around 08/04/2023).  Sanda Linger, MD

## 2023-05-04 NOTE — Patient Instructions (Signed)
 Thrombocytopenia Thrombocytopenia is a condition in which there are a low number of platelets in the blood. Platelets are also called thrombocytes. Platelets are parts of blood that stick together and form a clot to help the body stop bleeding after an injury. If you have too few platelets, your blood may have trouble clotting. This may cause you to bleed and bruise very easily. Some cases of thrombocytopenia are mild while others are more severe. What are the causes? This condition is caused by a low number of platelets in your blood. There are three main reasons for this: Your body not making enough platelets. This may be caused by: Bone marrow diseases. This include aplastic anemia, leukemia, and myelodysplastic anemia. Congenital thrombocytopenia. This is a condition that is passed from parent to child (inherited). Certain cancer treatments, including chemotherapy and radiation therapy. Infections from bacteria or viruses. Alcohol use disorder and alcoholism. Platelets not being released in the blood. This is called platelet sequestration and it can happen due to: An overactive spleen (hypersplenism). The spleen gathers up platelets from circulation, meaning that the platelets are not available to help with clotting your blood. The spleen can be enlarged because of scarring or other conditions. Gaucher disease. Your body destroying platelets too quickly. This may be caused by: An autoimmune disease that causes immune thrombocytopenia (ITP). ITP is sometimes associated with other autoimmune conditions such as lupus. Certain medicines, such as blood thinners. Certain blood clotting or bleeding disorders. Exposure to toxic chemicals, such as pesticides, lead, benzene, and arsenic. Pregnancy. What are the signs or symptoms? Symptoms of this condition are the result of poor blood clotting. They will vary depending on how low the platelet counts are. Symptoms may include: Bruising  easily. Bleeding from the mouth or nose. Heavy menstrual periods. Blood in the urine, stool (feces), or vomit. Purplish-red discolorations on the skin (purpura). A rash that looks like pinpoint, purplish-red spots (petechiae) on the lower legs. How is this diagnosed?  This condition may be diagnosed with blood tests and a physical exam. You may also have other tests, including: A sample of bone marrow (biopsy) may be removed to look for the original cells that make platelets. An ultrasound or CT scan of the abdomen to check for an enlarged spleen, enlarged lymph nodes, or liver problems. How is this treated? Treatment for this condition depends on the cause. Treatment may include: Treatment of another condition that is causing the low platelet count. Medicines to help protect your platelets from being destroyed. A replacement (transfusion) of platelets to stop or prevent bleeding. Surgery to remove the spleen. Follow these instructions at home: Medicines Take over-the-counter and prescription medicines only as told by your health care provider. Do not take any medicines that contain aspirin or NSAIDs, such as ibuprofen. These medicines increase your risk for dangerous bleeding. Activity Avoid activities that could cause injury or bruising, and follow instructions about how to prevent falls. Do not play contact sports. Ask your health care provider what activities are safe for you. Take extra care to protect yourself from burns when ironing or cooking. Take extra care not to cut yourself when you shave or when you use scissors, needles, knives, and other tools. General instructions  Check your skin and the inside of your mouth for bruising or bleeding as told by your health care provider. Wear a medical alert bracelet that says that you have a bleeding disorder. This can help you get the treatment you need in case of emergency. Check  your urine and stool for blood as told by your health  care provider. Do not drink alcohol. If you do drink alcohol, limit the amount that you drink. Minimize contact with toxic chemicals. Tell all your health care providers, including dental care providers and eye doctors, about your condition. Make sure to tell dental care providers before you have any procedure done, including dental cleanings. Keep all follow-up visits. This is important. Contact a health care provider if: You have unexplained bruising. You have new symptoms. You have symptoms that get worse. You have a fever. Get help right away if: You have severe bleeding from anywhere on your body. You have blood in your vomit, urine, or stool. You have an injury to your head. You have a sudden, severe headache. Summary Thrombocytopenia is a condition in which you have a low number of platelets in the blood. Platelets are parts of blood that stick together to form a clot. Symptoms of this condition are the result of poor blood clotting and may include bruising easily, bleeding from the nose or mouth, petechiae, and purpura. This condition may be diagnosed with blood tests and a physical exam. Treatment for this condition depends on the cause. This information is not intended to replace advice given to you by your health care provider. Make sure you discuss any questions you have with your health care provider. Document Revised: 07/09/2020 Document Reviewed: 07/09/2020 Elsevier Patient Education  2024 [employer].

## 2023-05-05 ENCOUNTER — Encounter: Payer: Self-pay | Admitting: Internal Medicine

## 2023-05-05 DIAGNOSIS — F1027 Alcohol dependence with alcohol-induced persisting dementia: Secondary | ICD-10-CM | POA: Insufficient documentation

## 2023-05-05 LAB — VITAMIN B12: Vitamin B-12: 242 pg/mL (ref 211–911)

## 2023-05-05 LAB — FOLATE: Folate: >25.2 ng/mL (ref >5.9)

## 2023-05-09 ENCOUNTER — Encounter: Payer: Self-pay | Admitting: Internal Medicine

## 2023-05-09 LAB — VITAMIN B1: Vitamin B1 (Thiamine): 33 nmol/L — ABNORMAL HIGH (ref 8–30)

## 2023-05-09 LAB — ZINC: Zinc: 90 ug/dL (ref 60–130)

## 2023-05-22 ENCOUNTER — Other Ambulatory Visit (HOSPITAL_COMMUNITY): Payer: Self-pay

## 2023-05-22 MED ORDER — DOXYCYCLINE HYCLATE 100 MG PO CAPS
100.0000 mg | ORAL_CAPSULE | Freq: Two times a day (BID) | ORAL | 0 refills | Status: DC
Start: 2023-05-22 — End: 2023-06-22
  Filled 2023-05-22: qty 10, 5d supply, fill #0

## 2023-06-06 ENCOUNTER — Other Ambulatory Visit: Payer: Self-pay | Admitting: Internal Medicine

## 2023-06-06 ENCOUNTER — Other Ambulatory Visit (HOSPITAL_COMMUNITY): Payer: Self-pay

## 2023-06-06 DIAGNOSIS — I10 Essential (primary) hypertension: Secondary | ICD-10-CM

## 2023-06-06 DIAGNOSIS — G250 Essential tremor: Secondary | ICD-10-CM

## 2023-06-06 MED ORDER — MUPIROCIN 2 % EX OINT
1.0000 | TOPICAL_OINTMENT | Freq: Every day | CUTANEOUS | 0 refills | Status: DC
  Filled 2023-06-06: qty 22, 30d supply, fill #0

## 2023-06-07 ENCOUNTER — Other Ambulatory Visit (HOSPITAL_COMMUNITY): Payer: Self-pay

## 2023-06-07 MED ORDER — LEVOFLOXACIN 500 MG PO TABS
500.0000 mg | ORAL_TABLET | Freq: Every day | ORAL | 0 refills | Status: DC
  Filled 2023-06-07: qty 10, 10d supply, fill #0

## 2023-06-08 ENCOUNTER — Other Ambulatory Visit (HOSPITAL_COMMUNITY): Payer: Self-pay

## 2023-06-09 ENCOUNTER — Encounter: Payer: Medicare HMO | Admitting: Internal Medicine

## 2023-06-13 ENCOUNTER — Ambulatory Visit: Payer: Self-pay

## 2023-06-13 ENCOUNTER — Other Ambulatory Visit: Payer: Self-pay

## 2023-06-13 ENCOUNTER — Emergency Department (HOSPITAL_COMMUNITY)
Admission: EM | Admit: 2023-06-13 | Discharge: 2023-06-14 | Discharge status: Against Medical Advice | Attending: Emergency Medicine | Admitting: Emergency Medicine

## 2023-06-13 DIAGNOSIS — Z5321 Procedure and treatment not carried out due to patient leaving prior to being seen by health care provider: Secondary | ICD-10-CM | POA: Insufficient documentation

## 2023-06-13 DIAGNOSIS — R110 Nausea: Secondary | ICD-10-CM | POA: Insufficient documentation

## 2023-06-13 DIAGNOSIS — R197 Diarrhea, unspecified: Secondary | ICD-10-CM | POA: Insufficient documentation

## 2023-06-13 DIAGNOSIS — E860 Dehydration: Secondary | ICD-10-CM | POA: Insufficient documentation

## 2023-06-13 LAB — CBC
HCT: 47 % (ref 39.0–52.0)
Hemoglobin: 17 g/dL (ref 13.0–17.0)
MCH: 36.2 pg — ABNORMAL HIGH (ref 26.0–34.0)
MCHC: 36.2 g/dL — ABNORMAL HIGH (ref 30.0–36.0)
MCV: 100 fL (ref 80.0–100.0)
Platelets: 162 10*3/uL (ref 150–400)
RBC: 4.7 MIL/uL (ref 4.22–5.81)
RDW: 12.1 % (ref 11.5–15.5)
WBC: 11.5 10*3/uL — ABNORMAL HIGH (ref 4.0–10.5)
nRBC: 0 % (ref 0.0–0.2)

## 2023-06-13 LAB — URINALYSIS, MICROSCOPIC (REFLEX)

## 2023-06-13 LAB — COMPREHENSIVE METABOLIC PANEL WITH GFR
ALT: 27 U/L (ref 0–44)
AST: 55 U/L — ABNORMAL HIGH (ref 15–41)
Albumin: 4.3 g/dL (ref 3.5–5.0)
Alkaline Phosphatase: 45 U/L (ref 38–126)
Anion gap: 11 (ref 5–15)
BUN: 13 mg/dL (ref 8–23)
CO2: 23 mmol/L (ref 22–32)
Calcium: 10.8 mg/dL — ABNORMAL HIGH (ref 8.9–10.3)
Chloride: 103 mmol/L (ref 98–111)
Creatinine, Ser: 1.77 mg/dL — ABNORMAL HIGH (ref 0.61–1.24)
GFR, Estimated: 39 mL/min — ABNORMAL LOW (ref >60)
Glucose, Bld: 131 mg/dL — ABNORMAL HIGH (ref 70–99)
Potassium: 4 mmol/L (ref 3.5–5.1)
Sodium: 137 mmol/L (ref 135–145)
Total Bilirubin: 2 mg/dL — ABNORMAL HIGH (ref 0.0–1.2)
Total Protein: 7.2 g/dL (ref 6.5–8.1)

## 2023-06-13 LAB — URINALYSIS, ROUTINE W REFLEX MICROSCOPIC
Glucose, UA: NEGATIVE mg/dL
Hgb urine dipstick: NEGATIVE
Ketones, ur: NEGATIVE mg/dL
Leukocytes,Ua: NEGATIVE
Nitrite: POSITIVE — AB
Protein, ur: 100 mg/dL — AB
Specific Gravity, Urine: >1.03 — ABNORMAL HIGH (ref 1.005–1.030)
pH: 5.5 (ref 5.0–8.0)

## 2023-06-13 LAB — C DIFFICILE QUICK SCREEN W PCR REFLEX
C Diff antigen: NEGATIVE
C Diff interpretation: NOT DETECTED
C Diff toxin: NEGATIVE

## 2023-06-13 LAB — LIPASE, BLOOD: Lipase: 65 U/L — ABNORMAL HIGH (ref 11–51)

## 2023-06-13 NOTE — ED Provider Triage Note (Signed)
 Emergency Medicine Provider Triage Evaluation Note  Juan Torres , a 78 y.o. male  was evaluated in triage.  Pt complains of diarrhea. States he started Levaquin  for a wound infection on his hand on May 1. Since then he has had watery stools every day. States today he had 30 episodes of watery diarrhea. Denies abdominal pain. Endorses nausea without vomiting. No fevers or chills but has felt clammy  Review of Systems  Positive:  Negative:   Physical Exam  BP 121/89   Pulse 94   Temp 97.6 F (36.4 C)   Resp 18   Ht 6' (1.829 m)   Wt 71.2 kg   SpO2 99%   BMI 21.29 kg/m  Gen:   Awake, no distress  Resp:  Normal effort  MSK:   Moves extremities without difficulty  Other:    Medical Decision Making  Medically screening exam initiated at 5:56 PM.  Appropriate orders placed.  Roee Manly Fennel was informed that the remainder of the evaluation will be completed by another provider, this initial triage assessment does not replace that evaluation, and the importance of remaining in the ED until their evaluation is complete.     Sherra Dk, PA-C 06/13/23 1759

## 2023-06-13 NOTE — Telephone Encounter (Signed)
 Chief Complaint: diarrhea on antibiotics Symptoms: nausea, severe diarrhea Frequency: x 5 days Pertinent Negatives: Patient denies vomiting, blood in stool, fever Disposition: [x] ED /[x] Urgent Care (no appt availability in office) / [] Appointment(In office/virtual)/ []  [facility]/ [] Home Care/ [] Refused Recommended Disposition /[] [facility]/ []  Follow-up with PCP Additional Notes: Wife, Suzane, on the phone for triage. 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 They started him on antibiotics and started having green loose stools and diarrhea with nausea. She states they called the dermatologist office and their nurse advised he should continue to take the medication. Patient stopped the medication after 2 doses due to diarrhea. Patient has been able to drink plenty of fluids including 6 x 20oz Gatorade in the last 24 hours. Advised wife due to severe diarrhea and risk for dehydration he needs to be evaluated today. Wife states they are agreeable to go to ED or urgent care today. Seh states she is concerned he may have Cdiff and states she will also call the dermatologist to inform them he stopped his antibiotic.  Copied from CRM [id number]. Topic: Clinical - Red Word Triage >> Jun 13, 2023 11:07 AM Caliyah H wrote: [facility] that prompted transfer to Nurse Triage: Patient's wife called this morning stating the patient recently underwent MOHS surgery and was prescribed two antibiotics. She reported the patient began taking Doxycycline  on 05/22/23, after which he developed diarrhea and discontinued the medication. The patient then started Levaquin  on 06/07/23, and subsequently developed loose green stools, continuous diarrhea, and nausea, though no vomiting has occurred. She expressed concern that the patient may be developing C. difficile infection. She mentioned speaking with someone from the  office previously and was advised for the patient to continue the antibiotic over the weekend just nausea but not vomiting. Reason for Disposition  SEVERE diarrhea (e.g., 7 or more times / day more than normal)  Answer Assessment - Initial Assessment Questions 1. ANTIBIOTIC: "What antibiotic are you taking?" "How many times per day?"     Levaquin  once daily, stopped taking the medication after 06/09/23.  2. ANTIBIOTIC ONSET: "When was the antibiotic started?"     06/08/23  3. DIARRHEA SEVERITY: "How bad is the diarrhea?" "How many more stools have you had in the past 24 hours than normal?"    - NO DIARRHEA (SCALE 0)   - MILD (SCALE 1-3): Few loose or mushy BMs; increase of 1-3 stools over normal daily number of stools; mild increase in ostomy output.   -  MODERATE (SCALE 4-7): Increase of 4-6 stools daily over normal; moderate increase in ostomy output. * SEVERE (SCALE 8-10; OR 'WORST POSSIBLE'): Increase of 7 or more stools daily over normal; moderate increase in ostomy output; incontinence.     15 watery stool this morning.  4. ONSET: "When did the diarrhea begin?"      Friday 06/09/23.  5. BM CONSISTENCY: "How loose or watery is the diarrhea?"      Watery.  6. VOMITING: "Are you also vomiting?" If Yes, ask: "How many times in the past 24 hours?"      Denies.  7. ABDOMEN PAIN: "Are you having any abdomen pain?" If Yes, ask: "What does it feel like?" (e.g., crampy, dull, intermittent, constant)      Denies. 8. ABDOMEN PAIN SEVERITY: If present, ask: "How bad is the pain?"  (e.g., Scale 1-10; mild, moderate, or severe)   -  MILD (1-3): doesn't interfere with normal activities, abdomen soft and not tender to touch    - MODERATE (4-7): interferes with normal activities or awakens from sleep, abdomen tender to touch    - SEVERE (8-10): excruciating pain, doubled over, unable to do any normal activities       0/10. 9. ORAL INTAKE: If vomiting, "Have you been able to drink liquids?" "How much  liquids have you had in the past 24 hours?"     Denies vomiting. Yes he is drinking liquids, he reports drinking Gatorade (6 bottles of 20 oz in the last 24 hours).  10. HYDRATION: "Any signs of dehydration?" (e.g., dry mouth [not just dry lips], too weak to stand, dizziness, new weight loss) "When did you last urinate?"       He reports dizziness sometimes but he is able to walk around the house and urinated today (several times).  11. EXPOSURE: "Have you traveled to a foreign country recently?" "Have you been exposed to anyone with diarrhea?" "Could you have eaten any food that was spoiled?"       Denies. 12. OTHER SYMPTOMS: "Do you have any other symptoms?" (e.g., fever, blood in stool)       Cramps in bilateral legs 13. PREGNANCY: "Is there any chance you are pregnant?" "When was your last menstrual period?"       N/A.  Protocols used: Diarrhea on Antibiotics-A-AH

## 2023-06-13 NOTE — ED Triage Notes (Signed)
 Pt arrives ambulatory by POV - pt believes he is dehydrated. Pt has been having diarrhea. Pt has mohs surgery, it got infected, and has been taking antibiotics. Diarrhea started after. Pt has body aches, chills, and feels flushed. Pt feels weak.

## 2023-06-14 ENCOUNTER — Other Ambulatory Visit (HOSPITAL_COMMUNITY): Payer: Self-pay

## 2023-06-14 ENCOUNTER — Telehealth: Payer: Self-pay | Admitting: Internal Medicine

## 2023-06-14 LAB — GASTROINTESTINAL PANEL BY PCR, STOOL (REPLACES STOOL CULTURE)

## 2023-06-14 NOTE — Telephone Encounter (Signed)
 Copied from CRM [id number]. Topic: Clinical - Lab/Test Results >> Jun 14, 2023  9:19 AM Juan Torres F wrote: Reason for CRM: Patient seen at [facility] Emergency room last night and left after 5 hours - his spouse Ottie Blonder requested a call back at [phone] to go over lab results that were done and to get some advice on what he should eat since he is still having nausea and diarrhea.

## 2023-06-15 NOTE — Telephone Encounter (Signed)
 Can you review the labs? Or would you like me to get this patient in tomorrow to see somebody ?

## 2023-06-15 NOTE — Telephone Encounter (Signed)
 He needs to be seen

## 2023-06-16 ENCOUNTER — Other Ambulatory Visit: Payer: Self-pay | Admitting: Internal Medicine

## 2023-06-16 ENCOUNTER — Other Ambulatory Visit (HOSPITAL_COMMUNITY): Payer: Self-pay

## 2023-06-16 DIAGNOSIS — G250 Essential tremor: Secondary | ICD-10-CM

## 2023-06-16 DIAGNOSIS — I10 Essential (primary) hypertension: Secondary | ICD-10-CM

## 2023-06-16 MED ORDER — PROPRANOLOL HCL ER 120 MG PO CP24
120.0000 mg | ORAL_CAPSULE | Freq: Every day | ORAL | 1 refills | Status: DC
Start: 2023-06-16 — End: 2023-11-13
  Filled 2023-06-16: qty 90, 90d supply, fill #0
  Filled 2023-10-03: qty 90, 90d supply, fill #1

## 2023-06-16 NOTE — Telephone Encounter (Signed)
 Patient has been scheduled

## 2023-06-16 NOTE — Telephone Encounter (Signed)
 Copied from CRM [id number]. Topic: Clinical - Medication Refill >> Jun 16, 2023  1:00 PM Akiba B wrote: Medication: propranolol  ER (INDERAL  LA) 120 MG 24 hr capsule   Has the patient contacted their pharmacy? Yes (Agent: If no, request that the patient contact the pharmacy for the refill. If patient does not wish to contact the pharmacy document the reason why and proceed with request.) (Agent: If yes, when and what did the pharmacy advise?)  This is the patient's preferred pharmacy:  [place] [facility] [facility] [address] [place] [address] Phone: [phone] Fax: [phone]   Is this the correct pharmacy for this prescription? Yes If no, delete pharmacy and type the correct one.   Has the prescription been filled recently? Yes  Is the patient out of the medication? Yes  Has the patient been seen for an appointment in the last year OR does the patient have an upcoming appointment? Yes  Can we respond through MyChart? No  Agent: Please be advised that Rx refills may take up to 3 business days. We ask that you follow-up with your pharmacy.

## 2023-06-19 ENCOUNTER — Other Ambulatory Visit (HOSPITAL_COMMUNITY): Payer: Self-pay

## 2023-06-22 ENCOUNTER — Ambulatory Visit: Admitting: Internal Medicine

## 2023-06-22 ENCOUNTER — Ambulatory Visit: Payer: Self-pay | Admitting: Internal Medicine

## 2023-06-22 ENCOUNTER — Encounter: Payer: Self-pay | Admitting: Internal Medicine

## 2023-06-22 ENCOUNTER — Other Ambulatory Visit (HOSPITAL_COMMUNITY): Payer: Self-pay

## 2023-06-22 VITALS — BP 124/74 | HR 90 | Temp 98.1°F | Resp 16 | Ht 72.0 in | Wt 150.0 lb

## 2023-06-22 DIAGNOSIS — N1832 Chronic kidney disease, stage 3b: Secondary | ICD-10-CM

## 2023-06-22 DIAGNOSIS — I10 Essential (primary) hypertension: Secondary | ICD-10-CM

## 2023-06-22 DIAGNOSIS — K760 Fatty (change of) liver, not elsewhere classified: Secondary | ICD-10-CM

## 2023-06-22 DIAGNOSIS — R748 Abnormal levels of other serum enzymes: Secondary | ICD-10-CM

## 2023-06-22 DIAGNOSIS — Z0000 Encounter for general adult medical examination without abnormal findings: Secondary | ICD-10-CM | POA: Diagnosis not present

## 2023-06-22 DIAGNOSIS — Z8546 Personal history of malignant neoplasm of prostate: Secondary | ICD-10-CM | POA: Diagnosis not present

## 2023-06-22 DIAGNOSIS — R634 Abnormal weight loss: Secondary | ICD-10-CM | POA: Diagnosis not present

## 2023-06-22 DIAGNOSIS — Z0001 Encounter for general adult medical examination with abnormal findings: Secondary | ICD-10-CM

## 2023-06-22 DIAGNOSIS — R739 Hyperglycemia, unspecified: Secondary | ICD-10-CM

## 2023-06-22 LAB — AMYLASE: Amylase: 48 U/L (ref 27–131)

## 2023-06-22 LAB — PSA: PSA: 0 ng/mL — ABNORMAL LOW (ref 0.10–4.00)

## 2023-06-22 LAB — LIPASE: Lipase: 80 U/L — ABNORMAL HIGH (ref 11.0–59.0)

## 2023-06-22 LAB — MAGNESIUM: Magnesium: 1.2 mg/dL — ABNORMAL LOW (ref 1.5–2.5)

## 2023-06-22 LAB — TSH: TSH: 1.55 u[IU]/mL (ref 0.35–5.50)

## 2023-06-22 MED ORDER — MAGNESIUM OXIDE -MG SUPPLEMENT 400 (240 MG) MG PO TABS
400.0000 mg | ORAL_TABLET | Freq: Every evening | ORAL | 0 refills | Status: AC
  Filled 2023-06-22 – 2023-07-29 (×2): qty 120, 120d supply, fill #0

## 2023-06-22 NOTE — Patient Instructions (Signed)
 Health Maintenance, Male  Adopting a healthy lifestyle and getting preventive care are important in promoting health and wellness. Ask your health care provider about:  The right schedule for you to have regular tests and exams.  Things you can do on your own to prevent diseases and keep yourself healthy.  What should I know about diet, weight, and exercise?  Eat a healthy diet    Eat a diet that includes plenty of vegetables, fruits, low-fat dairy products, and lean protein.  Do not eat a lot of foods that are high in solid fats, added sugars, or sodium.  Maintain a healthy weight  Body mass index (BMI) is a measurement that can be used to identify possible weight problems. It estimates body fat based on height and weight. Your health care provider can help determine your BMI and help you achieve or maintain a healthy weight.  Get regular exercise  Get regular exercise. This is one of the most important things you can do for your health. Most adults should:  Exercise for at least 150 minutes each week. The exercise should increase your heart rate and make you sweat (moderate-intensity exercise).  Do strengthening exercises at least twice a week. This is in addition to the moderate-intensity exercise.  Spend less time sitting. Even light physical activity can be beneficial.  Watch cholesterol and blood lipids  Have your blood tested for lipids and cholesterol at 78 years of age, then have this test every 5 years.  You may need to have your cholesterol levels checked more often if:  Your lipid or cholesterol levels are high.  You are older than 78 years of age.  You are at high risk for heart disease.  What should I know about cancer screening?  Many types of cancers can be detected early and may often be prevented. Depending on your health history and family history, you may need to have cancer screening at various ages. This may include screening for:  Colorectal cancer.  Prostate cancer.  Skin cancer.  Lung  cancer.  What should I know about heart disease, diabetes, and high blood pressure?  Blood pressure and heart disease  High blood pressure causes heart disease and increases the risk of stroke. This is more likely to develop in people who have high blood pressure readings or are overweight.  Talk with your health care provider about your target blood pressure readings.  Have your blood pressure checked:  Every 3-5 years if you are 9-95 years of age.  Every year if you are 85 years old or older.  If you are between the ages of 29 and 29 and are a current or former smoker, ask your health care provider if you should have a one-time screening for abdominal aortic aneurysm (AAA).  Diabetes  Have regular diabetes screenings. This checks your fasting blood sugar level. Have the screening done:  Once every three years after age 23 if you are at a normal weight and have a low risk for diabetes.  More often and at a younger age if you are overweight or have a high risk for diabetes.  What should I know about preventing infection?  Hepatitis B  If you have a higher risk for hepatitis B, you should be screened for this virus. Talk with your health care provider to find out if you are at risk for hepatitis B infection.  Hepatitis C  Blood testing is recommended for:  Everyone born from [birth year] through 1965.  Anyone  with known risk factors for hepatitis C.  Sexually transmitted infections (STIs)  You should be screened each year for STIs, including gonorrhea and chlamydia, if:  You are sexually active and are younger than 78 years of age.  You are older than 78 years of age and your health care provider tells you that you are at risk for this type of infection.  Your sexual activity has changed since you were last screened, and you are at increased risk for chlamydia or gonorrhea. Ask your health care provider if you are at risk.  Ask your health care provider about whether you are at high risk for HIV. Your health care provider  may recommend a prescription medicine to help prevent HIV infection. If you choose to take medicine to prevent HIV, you should first get tested for HIV. You should then be tested every 3 months for as long as you are taking the medicine.  Follow these instructions at home:  Alcohol use  Do not drink alcohol if your health care provider tells you not to drink.  If you drink alcohol:  Limit how much you have to 0-2 drinks a day.  Know how much alcohol is in your drink. In the [place] one drink equals one 12 oz bottle of beer (355 mL), one 5 oz glass of wine (148 mL), or one 1 oz glass of hard liquor (44 mL).  Lifestyle  Do not use any products that contain nicotine or tobacco. These products include cigarettes, chewing tobacco, and vaping devices, such as e-cigarettes. If you need help quitting, ask your health care provider.  Do not use street drugs.  Do not share needles.  Ask your health care provider for help if you need support or information about quitting drugs.  General instructions  Schedule regular health, dental, and eye exams.  Stay current with your vaccines.  Tell your health care provider if:  You often feel depressed.  You have ever been abused or do not feel safe at home.  Summary  Adopting a healthy lifestyle and getting preventive care are important in promoting health and wellness.  Follow your health care provider's instructions about healthy diet, exercising, and getting tested or screened for diseases.  Follow your health care provider's instructions on monitoring your cholesterol and blood pressure.  This information is not intended to replace advice given to you by your health care provider. Make sure you discuss any questions you have with your health care provider.  Document Revised: 06/15/2020 Document Reviewed: 06/15/2020  Elsevier Patient Education  2024 [employer].

## 2023-06-22 NOTE — Progress Notes (Signed)
 Subjective:  Patient ID: Juan Torres, male    DOB: 1945-11-23  Age: 77 y.o. MRN: [id number]  CC: Annual Exam   HPI Juan Torres presents for a CPX and f/up ----  Discussed the use of AI scribe software for clinical note transcription with the patient, who gave verbal consent to proceed.  History of Present Illness   Juan Torres is a 78 year old male who presents with diarrhea and weight loss following antibiotic treatment.  He experienced severe diarrhea after starting Levaquin  for an infected Mohs surgery site. The antibiotic was taken for two days before the onset of diarrhea, which worsened over the weekend. By Monday, he had approximately twenty watery stools per day, leading to dehydration. He visited the emergency room where lab work, urine culture, and stool culture were performed. Despite the symptoms, the stool culture was negative for C. diff and norovirus. After resting at home, his diarrhea resolved, but he continued to feel weak and had low energy.  He reported a significant weight loss of thirteen pounds over the past month, noting that he was only eating one meal a day during this period. He also experienced nausea and vomited once, but did not have abdominal pain. During the episode, he felt clammy and had chills, which prevented him from attending a family funeral.  No current abdominal pain, nausea, vomiting, or diarrhea. No heart racing until arriving at the clinic. He acknowledges weight loss and weakness.       Outpatient Medications Prior to Visit  Medication Sig Dispense Refill   Cholecalciferol (VITAMIN D3) 125 MCG (5000 UT) TABS Take 10,000 Units by mouth every morning.     ezetimibe  (ZETIA ) 10 MG tablet Take 1 tablet (10 mg total) by mouth daily for cholesterol. 90 tablet 3   famotidine  (PEPCID ) 20 MG tablet Take 1 tablet (20 mg total) by mouth 2 (two) times daily; before breakfast and before dinner 180 tablet 0   fenofibrate  micronized (LOFIBRA) 134 MG capsule  Take 1 capsule (134 mg total) by mouth daily (blood fats). 90 capsule 3   mupirocin  ointment (BACTROBAN ) 2 % Apply a small amount to affected area once a day with bandage changes. 22 g 0   Omega-3 Fatty Acids (FISH OIL) 1000 MG CAPS Take 1,000 mg by mouth every morning.     omeprazole  (PRILOSEC) 40 MG capsule Take 1 capsule (40 mg total) by mouth daily to prevent indigestion & heartburn 90 capsule 0   propranolol  ER (INDERAL  LA) 120 MG 24 hr capsule Take 1 capsule (120 mg total) by mouth daily. For blood pressure and tremor 90 capsule 1   rosuvastatin  (CRESTOR ) 40 MG tablet Take 1 tablet (40 mg total) by mouth daily for cholesterol 90 tablet 0   doxycycline  (VIBRAMYCIN ) 100 MG capsule Take 1 capsule (100 mg total) by mouth 2 (two) times daily with meals 10 capsule 0   levofloxacin  (LEVAQUIN ) 500 MG tablet Take 1 tablet by mouth once a day 10 tablet 0   Magnesium  400 MG CAPS Take 400 mg by mouth every evening.      No facility-administered medications prior to visit.    ROS Review of Systems  Constitutional:  Positive for unexpected weight change (wt loss). Negative for appetite change, chills, diaphoresis and fatigue.  Eyes: Negative.   Respiratory:  Negative for cough, chest tightness, shortness of breath and wheezing.   Cardiovascular:  Negative for chest pain, palpitations and leg swelling.  Gastrointestinal: Negative.  Negative for  abdominal pain, constipation, diarrhea, nausea and vomiting.  Endocrine: Negative.   Genitourinary: Negative.  Negative for difficulty urinating, dysuria, flank pain and hematuria.  Musculoskeletal: Negative.  Negative for arthralgias and myalgias.  Skin: Negative.   Neurological: Negative.  Negative for dizziness and weakness.  Hematological:  Negative for adenopathy. Does not bruise/bleed easily.  Psychiatric/Behavioral:  Positive for decreased concentration and dysphoric mood.     Objective:  BP 124/74 (BP Location: Left Arm, Patient Position: Sitting,  Cuff Size: Normal)   Pulse 90   Temp 98.1 F (36.7 C) (Oral)   Resp 16   Ht 6' (1.829 m)   Wt 150 lb (68 kg)   SpO2 98%   BMI 20.34 kg/m   BP Readings from Last 3 Encounters:  06/22/23 124/74  06/13/23 (!) 150/85  05/04/23 124/76    Wt Readings from Last 3 Encounters:  06/22/23 150 lb (68 kg)  06/13/23 157 lb (71.2 kg)  05/04/23 157 lb 12.8 oz (71.6 kg)    Physical Exam Vitals reviewed.  Constitutional:      General: He is not in acute distress.    Appearance: He is underweight. He is ill-appearing. He is not toxic-appearing or diaphoretic.  HENT:     Mouth/Throat:     Mouth: Mucous membranes are moist.  Eyes:     General: No scleral icterus.    Pupils: Pupils are equal, round, and reactive to light.  Cardiovascular:     Rate and Rhythm: Normal rate and regular rhythm.     Heart sounds: No murmur heard.    No friction rub. No gallop.  Pulmonary:     Effort: Pulmonary effort is normal.     Breath sounds: No stridor. No wheezing, rhonchi or rales.  Abdominal:     General: Abdomen is flat.     Palpations: There is no mass.     Tenderness: There is no abdominal tenderness. There is no guarding.     Hernia: No hernia is present.  Musculoskeletal:        General: Normal range of motion.     Cervical back: Neck supple.     Right lower leg: No edema.     Left lower leg: No edema.  Lymphadenopathy:     Cervical: No cervical adenopathy.  Skin:    General: Skin is warm and dry.  Neurological:     General: No focal deficit present.     Mental Status: He is oriented to person, place, and time.  Psychiatric:        Mood and Affect: Mood normal.        Behavior: Behavior normal.     Lab Results  Component Value Date   WBC 11.5 (H) 06/13/2023   HGB 17.0 06/13/2023   HCT 47.0 06/13/2023   PLT 162 06/13/2023   GLUCOSE 131 (H) 06/13/2023   CHOL 138 12/01/2022   TRIG 113 12/01/2022   HDL 56 12/01/2022   LDLCALC 62 12/01/2022   ALT 27 06/13/2023   AST 55 (H)  06/13/2023   NA 137 06/13/2023   K 4.0 06/13/2023   CL 103 06/13/2023   CREATININE 1.77 (H) 06/13/2023   BUN 13 06/13/2023   CO2 23 06/13/2023   TSH 1.55 06/22/2023   PSA 0.00 (L) 06/22/2023   INR 1.0 05/04/2023   HGBA1C 5.6 12/01/2022   MICROALBUR <0.2 05/31/2022    No results found.  Assessment & Plan:   Essential hypertension- BP is well controlled. -  TSH; Future  Unexplained weight loss- WBC and lipase are elevated. Will evaluate for pancreatitis, malignancy, occult abscess. -     Amylase; Future -     Lipase; Future -     TSH; Future -     PSA; Future -     MR ABDOMEN W WO CONTRAST; Future  Elevated lipase -     Amylase; Future -     Lipase; Future -     MR ABDOMEN W WO CONTRAST; Future  Stage 3b chronic kidney disease (HCC)- Will avoid nephrotoxic agents   Hypomagnesemia -     Magnesium ; Future -     Magnesium  Oxide -Mg Supplement; Take 1 tablet (400 mg total) by mouth every evening.  Dispense: 120 tablet; Refill: 0  Hepatic steatosis -     MR ABDOMEN W WO CONTRAST; Future  Encounter for general adult medical examination with abnormal findings- Exam completed, labs reviewed, vaccines reviewed, no cancer screenings indicated, pt ed material was given.   History of prostate cancer -     PSA; Future     Follow-up: Return in about 3 months (around 09/22/2023).  Sandra Crouch, MD

## 2023-06-30 ENCOUNTER — Encounter: Payer: Self-pay | Admitting: Internal Medicine

## 2023-07-04 ENCOUNTER — Other Ambulatory Visit (HOSPITAL_COMMUNITY): Payer: Self-pay

## 2023-07-10 ENCOUNTER — Encounter: Payer: Medicare HMO | Admitting: Internal Medicine

## 2023-07-12 ENCOUNTER — Ambulatory Visit
Admission: RE | Admit: 2023-07-12 | Discharge: 2023-07-12 | Disposition: A | Discharge status: Home / Self Care | Source: Ambulatory Visit | Attending: Internal Medicine | Admitting: Internal Medicine

## 2023-07-12 DIAGNOSIS — R748 Abnormal levels of other serum enzymes: Secondary | ICD-10-CM

## 2023-07-12 DIAGNOSIS — K760 Fatty (change of) liver, not elsewhere classified: Secondary | ICD-10-CM

## 2023-07-12 DIAGNOSIS — R634 Abnormal weight loss: Secondary | ICD-10-CM

## 2023-07-12 MED ORDER — GADOPICLENOL 0.5 MMOL/ML IV SOLN
7.5000 mL | Freq: Once | INTRAVENOUS | Status: AC | PRN
  Administered 2023-07-12: 7 mL via INTRAVENOUS

## 2023-07-13 ENCOUNTER — Other Ambulatory Visit (HOSPITAL_COMMUNITY): Payer: Self-pay

## 2023-07-13 ENCOUNTER — Other Ambulatory Visit: Payer: Self-pay | Admitting: Internal Medicine

## 2023-07-13 ENCOUNTER — Other Ambulatory Visit: Payer: Self-pay | Admitting: Family

## 2023-07-13 DIAGNOSIS — E782 Mixed hyperlipidemia: Secondary | ICD-10-CM

## 2023-07-13 DIAGNOSIS — K8689 Other specified diseases of pancreas: Secondary | ICD-10-CM | POA: Insufficient documentation

## 2023-07-14 ENCOUNTER — Other Ambulatory Visit (HOSPITAL_COMMUNITY): Payer: Self-pay

## 2023-07-14 MED ORDER — ROSUVASTATIN CALCIUM 40 MG PO TABS
40.0000 mg | ORAL_TABLET | Freq: Every day | ORAL | 0 refills | Status: DC
  Filled 2023-07-14: qty 90, 90d supply, fill #0

## 2023-07-18 ENCOUNTER — Other Ambulatory Visit (HOSPITAL_COMMUNITY): Payer: Self-pay

## 2023-07-19 ENCOUNTER — Observation Stay (HOSPITAL_COMMUNITY)

## 2023-07-19 ENCOUNTER — Emergency Department (HOSPITAL_COMMUNITY)

## 2023-07-19 ENCOUNTER — Encounter (HOSPITAL_COMMUNITY): Payer: Self-pay | Admitting: Emergency Medicine

## 2023-07-19 ENCOUNTER — Inpatient Hospital Stay (HOSPITAL_COMMUNITY)
Admission: EM | Admit: 2023-07-19 | Discharge: 2023-07-23 | DRG: 682 | Disposition: A | Discharge status: Home / Self Care | Attending: Internal Medicine | Admitting: Internal Medicine

## 2023-07-19 ENCOUNTER — Other Ambulatory Visit: Payer: Self-pay

## 2023-07-19 DIAGNOSIS — R9431 Abnormal electrocardiogram [ECG] [EKG]: Secondary | ICD-10-CM | POA: Diagnosis present

## 2023-07-19 DIAGNOSIS — E860 Dehydration: Secondary | ICD-10-CM | POA: Diagnosis present

## 2023-07-19 DIAGNOSIS — G9341 Metabolic encephalopathy: Secondary | ICD-10-CM | POA: Diagnosis not present

## 2023-07-19 DIAGNOSIS — R7303 Prediabetes: Secondary | ICD-10-CM | POA: Diagnosis present

## 2023-07-19 DIAGNOSIS — Z8546 Personal history of malignant neoplasm of prostate: Secondary | ICD-10-CM | POA: Diagnosis not present

## 2023-07-19 DIAGNOSIS — R0609 Other forms of dyspnea: Secondary | ICD-10-CM

## 2023-07-19 DIAGNOSIS — F17210 Nicotine dependence, cigarettes, uncomplicated: Secondary | ICD-10-CM | POA: Diagnosis present

## 2023-07-19 DIAGNOSIS — R739 Hyperglycemia, unspecified: Secondary | ICD-10-CM | POA: Diagnosis not present

## 2023-07-19 DIAGNOSIS — R569 Unspecified convulsions: Secondary | ICD-10-CM | POA: Diagnosis not present

## 2023-07-19 DIAGNOSIS — I129 Hypertensive chronic kidney disease with stage 1 through stage 4 chronic kidney disease, or unspecified chronic kidney disease: Secondary | ICD-10-CM | POA: Diagnosis present

## 2023-07-19 DIAGNOSIS — D3A8 Other benign neuroendocrine tumors: Secondary | ICD-10-CM | POA: Diagnosis present

## 2023-07-19 DIAGNOSIS — N179 Acute kidney failure, unspecified: Principal | ICD-10-CM

## 2023-07-19 DIAGNOSIS — E785 Hyperlipidemia, unspecified: Secondary | ICD-10-CM | POA: Diagnosis present

## 2023-07-19 DIAGNOSIS — N1831 Chronic kidney disease, stage 3a: Secondary | ICD-10-CM | POA: Diagnosis present

## 2023-07-19 DIAGNOSIS — R001 Bradycardia, unspecified: Secondary | ICD-10-CM | POA: Diagnosis present

## 2023-07-19 DIAGNOSIS — K2100 Gastro-esophageal reflux disease with esophagitis, without bleeding: Secondary | ICD-10-CM | POA: Diagnosis present

## 2023-07-19 DIAGNOSIS — F0390 Unspecified dementia without behavioral disturbance: Secondary | ICD-10-CM | POA: Diagnosis present

## 2023-07-19 DIAGNOSIS — R079 Chest pain, unspecified: Secondary | ICD-10-CM | POA: Diagnosis present

## 2023-07-19 DIAGNOSIS — R1013 Epigastric pain: Secondary | ICD-10-CM

## 2023-07-19 DIAGNOSIS — F10239 Alcohol dependence with withdrawal, unspecified: Secondary | ICD-10-CM | POA: Diagnosis not present

## 2023-07-19 DIAGNOSIS — R072 Precordial pain: Secondary | ICD-10-CM

## 2023-07-19 DIAGNOSIS — J449 Chronic obstructive pulmonary disease, unspecified: Secondary | ICD-10-CM | POA: Diagnosis present

## 2023-07-19 DIAGNOSIS — F10931 Alcohol use, unspecified with withdrawal delirium: Secondary | ICD-10-CM | POA: Diagnosis not present

## 2023-07-19 DIAGNOSIS — E569 Vitamin deficiency, unspecified: Secondary | ICD-10-CM | POA: Diagnosis present

## 2023-07-19 LAB — CBC
HCT: 47.1 % (ref 39.0–52.0)
HCT: 40.5 % (ref 39.0–52.0)
Hemoglobin: 17.5 g/dL — ABNORMAL HIGH (ref 13.0–17.0)
Hemoglobin: 14.9 g/dL (ref 13.0–17.0)
MCH: 37.2 pg — ABNORMAL HIGH (ref 26.0–34.0)
MCH: 37.4 pg — ABNORMAL HIGH (ref 26.0–34.0)
MCHC: 37.2 g/dL — ABNORMAL HIGH (ref 30.0–36.0)
MCHC: 36.8 g/dL — ABNORMAL HIGH (ref 30.0–36.0)
MCV: 100.2 fL — ABNORMAL HIGH (ref 80.0–100.0)
MCV: 101.8 fL — ABNORMAL HIGH (ref 80.0–100.0)
Platelets: 255 10*3/uL (ref 150–400)
Platelets: 209 10*3/uL (ref 150–400)
RBC: 4.7 MIL/uL (ref 4.22–5.81)
RBC: 3.98 MIL/uL — ABNORMAL LOW (ref 4.22–5.81)
RDW: 12.5 % (ref 11.5–15.5)
RDW: 12.5 % (ref 11.5–15.5)
WBC: 12.8 10*3/uL — ABNORMAL HIGH (ref 4.0–10.5)
WBC: 14.2 10*3/uL — ABNORMAL HIGH (ref 4.0–10.5)
nRBC: 0 % (ref 0.0–0.2)
nRBC: 0 % (ref 0.0–0.2)

## 2023-07-19 LAB — HEMOGLOBIN A1C
Hgb A1c MFr Bld: 4.7 % — ABNORMAL LOW (ref 4.8–5.6)
Mean Plasma Glucose: 88.19 mg/dL

## 2023-07-19 LAB — COMPREHENSIVE METABOLIC PANEL WITH GFR
ALT: 22 U/L (ref 0–44)
AST: 31 U/L (ref 15–41)
Albumin: 4.2 g/dL (ref 3.5–5.0)
Alkaline Phosphatase: 47 U/L (ref 38–126)
Anion gap: 25 — ABNORMAL HIGH (ref 5–15)
BUN: 30 mg/dL — ABNORMAL HIGH (ref 8–23)
CO2: 30 mmol/L (ref 22–32)
Calcium: 10.9 mg/dL — ABNORMAL HIGH (ref 8.9–10.3)
Chloride: 82 mmol/L — ABNORMAL LOW (ref 98–111)
Creatinine, Ser: 3.47 mg/dL — ABNORMAL HIGH (ref 0.61–1.24)
GFR, Estimated: 17 mL/min — ABNORMAL LOW (ref >60)
Glucose, Bld: 278 mg/dL — ABNORMAL HIGH (ref 70–99)
Potassium: 4.2 mmol/L (ref 3.5–5.1)
Sodium: 137 mmol/L (ref 135–145)
Total Bilirubin: 1.9 mg/dL — ABNORMAL HIGH (ref 0.0–1.2)
Total Protein: 6.9 g/dL (ref 6.5–8.1)

## 2023-07-19 LAB — BASIC METABOLIC PANEL WITH GFR
Anion gap: 25 — ABNORMAL HIGH (ref 5–15)
BUN: 35 mg/dL — ABNORMAL HIGH (ref 8–23)
CO2: 29 mmol/L (ref 22–32)
Calcium: 9.8 mg/dL (ref 8.9–10.3)
Chloride: 82 mmol/L — ABNORMAL LOW (ref 98–111)
Creatinine, Ser: 3.89 mg/dL — ABNORMAL HIGH (ref 0.61–1.24)
GFR, Estimated: 15 mL/min — ABNORMAL LOW (ref >60)
Glucose, Bld: 206 mg/dL — ABNORMAL HIGH (ref 70–99)
Potassium: 4.2 mmol/L (ref 3.5–5.1)
Sodium: 136 mmol/L (ref 135–145)

## 2023-07-19 LAB — ECHOCARDIOGRAM COMPLETE
Area-P 1/2: 2.75 cm2
Calc EF: 64.2 %
Height: 72 in
S' Lateral: 2.5 cm
Single Plane A2C EF: 66.9 %
Single Plane A4C EF: 59.9 %
Weight: 2398.6 [oz_av]

## 2023-07-19 LAB — URINALYSIS, ROUTINE W REFLEX MICROSCOPIC
Glucose, UA: 50 mg/dL — AB
Hgb urine dipstick: NEGATIVE
Ketones, ur: NEGATIVE mg/dL
Leukocytes,Ua: NEGATIVE
Nitrite: NEGATIVE
Protein, ur: 30 mg/dL — AB
Specific Gravity, Urine: 1.019 (ref 1.005–1.030)
pH: 5 (ref 5.0–8.0)

## 2023-07-19 LAB — TROPONIN I (HIGH SENSITIVITY)
Troponin I (High Sensitivity): 16 ng/L (ref <18)
Troponin I (High Sensitivity): 20 ng/L — ABNORMAL HIGH (ref <18)
Troponin I (High Sensitivity): 21 ng/L — ABNORMAL HIGH (ref <18)

## 2023-07-19 LAB — D-DIMER, QUANTITATIVE: D-Dimer, Quant: 1.3 ug{FEU}/mL — ABNORMAL HIGH (ref 0.00–0.50)

## 2023-07-19 LAB — LIPASE, BLOOD: Lipase: 42 U/L (ref 11–51)

## 2023-07-19 LAB — GLUCOSE, CAPILLARY
Glucose-Capillary: 132 mg/dL — ABNORMAL HIGH (ref 70–99)
Glucose-Capillary: 115 mg/dL — ABNORMAL HIGH (ref 70–99)

## 2023-07-19 LAB — CBG MONITORING, ED: Glucose-Capillary: 173 mg/dL — ABNORMAL HIGH (ref 70–99)

## 2023-07-19 LAB — MAGNESIUM: Magnesium: 1.3 mg/dL — ABNORMAL LOW (ref 1.7–2.4)

## 2023-07-19 MED ORDER — HYDRALAZINE HCL 20 MG/ML IJ SOLN: 5.0000 mg | Freq: Four times a day (QID) | INTRAMUSCULAR | Status: DC | PRN

## 2023-07-19 MED ORDER — FOLIC ACID 1 MG PO TABS
1.0000 mg | ORAL_TABLET | Freq: Every day | ORAL | Status: DC
  Administered 2023-07-19 – 2023-07-23 (×4): 1 mg via ORAL
  Filled 2023-07-19 (×4): qty 1

## 2023-07-19 MED ORDER — LORAZEPAM 1 MG PO TABS: 1.0000 mg | ORAL_TABLET | ORAL | Status: AC | PRN

## 2023-07-19 MED ORDER — INSULIN ASPART 100 UNIT/ML IJ SOLN: 0.0000 [IU] | Freq: Every day | INTRAMUSCULAR | Status: DC

## 2023-07-19 MED ORDER — PANTOPRAZOLE SODIUM 40 MG IV SOLR
40.0000 mg | Freq: Two times a day (BID) | INTRAVENOUS | Status: DC
  Administered 2023-07-19 – 2023-07-23 (×9): 40 mg via INTRAVENOUS
  Filled 2023-07-19 (×9): qty 10

## 2023-07-19 MED ORDER — THIAMINE MONONITRATE 100 MG PO TABS
100.0000 mg | ORAL_TABLET | Freq: Every day | ORAL | Status: DC
  Administered 2023-07-19 – 2023-07-23 (×4): 100 mg via ORAL
  Filled 2023-07-19 (×4): qty 1

## 2023-07-19 MED ORDER — SODIUM CHLORIDE 0.9 % IV BOLUS
500.0000 mL | Freq: Once | INTRAVENOUS | Status: AC
  Administered 2023-07-19: 500 mL via INTRAVENOUS

## 2023-07-19 MED ORDER — INSULIN ASPART 100 UNIT/ML IJ SOLN: 0.0000 [IU] | Freq: Three times a day (TID) | INTRAMUSCULAR | Status: DC

## 2023-07-19 MED ORDER — INSULIN ASPART 100 UNIT/ML IJ SOLN: 0.0000 [IU] | INTRAMUSCULAR | Status: DC

## 2023-07-19 MED ORDER — THIAMINE HCL 100 MG/ML IJ SOLN
100.0000 mg | Freq: Every day | INTRAMUSCULAR | Status: DC
  Administered 2023-07-20: 100 mg via INTRAVENOUS
  Filled 2023-07-19: qty 2

## 2023-07-19 MED ORDER — SODIUM CHLORIDE 0.9 % IV SOLN: INTRAVENOUS | Status: AC

## 2023-07-19 MED ORDER — ACETAMINOPHEN 650 MG RE SUPP: 650.0000 mg | Freq: Four times a day (QID) | RECTAL | Status: DC | PRN

## 2023-07-19 MED ORDER — ACETAMINOPHEN 325 MG PO TABS: 650.0000 mg | ORAL_TABLET | Freq: Four times a day (QID) | ORAL | Status: DC | PRN

## 2023-07-19 MED ORDER — LORAZEPAM 2 MG/ML IJ SOLN
1.0000 mg | INTRAMUSCULAR | Status: DC | PRN
  Administered 2023-07-19: 2 mg via INTRAVENOUS
  Administered 2023-07-20: 3 mg via INTRAVENOUS
  Administered 2023-07-20: 2 mg via INTRAVENOUS
  Filled 2023-07-19: qty 2
  Filled 2023-07-19 (×2): qty 1

## 2023-07-19 MED ORDER — INSULIN ASPART 100 UNIT/ML IJ SOLN
0.0000 [IU] | Freq: Three times a day (TID) | INTRAMUSCULAR | Status: DC
  Administered 2023-07-19: 2 [IU] via SUBCUTANEOUS
  Administered 2023-07-19 – 2023-07-20 (×2): 1 [IU] via SUBCUTANEOUS

## 2023-07-19 MED ORDER — HEPARIN SODIUM (PORCINE) 5000 UNIT/ML IJ SOLN
5000.0000 [IU] | Freq: Three times a day (TID) | INTRAMUSCULAR | Status: DC
  Administered 2023-07-19 – 2023-07-23 (×10): 5000 [IU] via SUBCUTANEOUS
  Filled 2023-07-19 (×13): qty 1

## 2023-07-19 MED ORDER — ADULT MULTIVITAMIN W/MINERALS CH
1.0000 | ORAL_TABLET | Freq: Every day | ORAL | Status: DC
  Administered 2023-07-19 – 2023-07-23 (×4): 1 via ORAL
  Filled 2023-07-19 (×4): qty 1

## 2023-07-19 NOTE — ED Notes (Signed)
 Call with updates:  Noble Cicalese (son) [phone]

## 2023-07-19 NOTE — Discharge Instructions (Signed)

## 2023-07-19 NOTE — H&P (Signed)
 History and Physical    [name] JXB:[id number] DOB: 1945-07-06 DOA: 07/19/2023  PCP: Juan Knuckles, MD  Patient coming from: Home  Chief Complaint: Chest pain  HPI: Juan Torres is a 78 y.o. male with medical history significant of hypertension, CKD stage II-IIIa, COPD, anxiety, depression, GERD, hypertension, hyperlipidemia, prediabetes, OSA not on CPAP, tobacco abuse, prostate cancer in remission.  Patient recently evaluated by his PCP for unintentional weight loss, leukocytosis, and elevated lipase.  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  No evidence of lymphadenopathy or metastatic disease in the abdomen.  He has been referred to general surgery and has an upcoming appointment on 07/26/2023.  Patient presents to the ED via EMS tonight for evaluation of chest pain, nausea, and vomiting.  He was given aspirin and nitroglycerin by EMS.  In the ED, patient bradycardic with heart rate in the high 40s/low 50s but not hypotensive.  Afebrile.  Not tachypneic or hypoxic.  Labs notable for WBC count 12.8, hemoglobin 17.5, chloride 82, bicarb 30, glucose 278, BUN 30, creatinine 3.4 (was 1.7 on 5/6 but previously 1.2 on 04/18/2023), calcium  10.9, albumin 4.2, T. bili 1.9 (previously 2.0 on labs a month ago) transaminases and alkaline phosphatase normal, lipase normal, UA pending, troponin 20.  EKG showing sinus rhythm, baseline wander, QTc 518, and no acute ischemic changes.  Chest x-ray showing no acute cardiopulmonary disease.  Patient was given 500 mL normal saline.  TRH called to admit.  Patient is a poor historian.  He is reporting 1 week history of substernal chest pain, nausea, vomiting, and diarrhea.  He denies any abdominal pain.  Vomiting and diarrhea stopped 3 days ago but continues to have chest pain and belching every time he tries to eat or drink.  He has been able to drink fluids in the ED without vomiting.  Denies fevers or  chills.  Reports chronic dyspnea on exertion and no worsening shortness of breath.  No other complaints.  Review of Systems:  Review of Systems  All other systems reviewed and are negative.   Past Medical History:  Diagnosis Date   Adenomatous colon polyp    Anxiety    Arthritis    FEET   COPD (chronic obstructive pulmonary disease) (HCC)    Depression    Familial tremor    GERD (gastroesophageal reflux disease)    Headache    When younger   History of basal cell cancer 2012   Hyperlipidemia    Hypertension    Prediabetes    Prostate cancer (HCC) DX 12/19/11   bx=Adenocarcinoma,gleason=3+3=6,volume=42cc,PSA=4.63   PTSD (post-traumatic stress disorder)    Pulmonary nodules 12/10/2018   Numerous unchanged/benign appearing per CT lung 12/2018   Sleep apnea    no cpap   Vitamin D  deficiency     Past Surgical History:  Procedure Laterality Date   COLONOSCOPY W/ POLYPECTOMY     Adenomatous   CYSTOSCOPY N/A 04/04/2012   Procedure: CYSTOSCOPY;  Surgeon: Soledad Dupes, MD;  Location: [facility];  Service: Urology;  Laterality: N/A;   EYE SURGERY     bilateral cataracts    INGUINAL HERNIA REPAIR Bilateral 10/18/2018   Procedure: LAPAROSCOPIC BILATERAL INGUINAL HERNIA REPAIR;  Surgeon: Jacolyn Matar, MD;  Location: [facility];  Service: General;  Laterality: Bilateral;   INGUINAL HERNIA REPAIR Right 04/25/2023   Procedure: OPEN RIGHT INGUINAL HERNIA REPAIR WITH MESH;  Surgeon: Adalberto Acton, MD;  Location: [facility];  Service: General;  Laterality: Right;   PROSTATE BIOPSY  12/19/2011   Procedure: BIOPSY TRANSRECTAL ULTRASONIC PROSTATE (TUBP);  Surgeon: Soledad Dupes, MD;  Location: [facility];  Service: Urology;  Laterality: N/A;     RADIOACTIVE SEED IMPLANT N/A 04/04/2012   Procedure: RADIOACTIVE SEED IMPLANT;  Surgeon: Soledad Dupes, MD;  Location: [facility];  Service: Urology;  Laterality: N/A;  Seeds  Implanted     72  Seeds Found in Bladder     None      reports that he has been smoking cigarettes and cigars. He started smoking about 34 years ago. He has a 30 pack-year smoking history. He has never used smokeless tobacco. He reports current alcohol use of about 30.0 standard drinks of alcohol per week. He reports that he does not currently use drugs after having used the following drugs: Marijuana. Frequency: 7.00 times per week.  Allergies  Allergen Reactions   Lipitor [Atorvastatin] Other (See Comments)    fatigued   Niacin And Related Other (See Comments)    Flushing   Penicillins Rash    Family History  Problem Relation Age of Onset   Breast cancer Mother    Heart attack Father 29   Dementia Father    Prostate cancer Maternal Uncle        prostate   Ulcers Maternal Grandmother    Cancer Maternal Grandfather        colon   Prostate cancer Cousin        prostate cancer 1st maternal    Sleep apnea Neg Hx     Prior to Admission medications   Medication Sig Start Date End Date Taking? Authorizing Provider  Cholecalciferol (VITAMIN D3) 125 MCG (5000 UT) TABS Take 10,000 Units by mouth every morning.    [provider]  ezetimibe  (ZETIA ) 10 MG tablet Take 1 tablet (10 mg total) by mouth daily for cholesterol. 08/08/22   Wilkinson, Dana E, FNP  famotidine  (PEPCID ) 20 MG tablet Take 1 tablet (20 mg total) by mouth 2 (two) times daily; before breakfast and before dinner 12/11/20   Ashaway Bureau, NP  fenofibrate  micronized (LOFIBRA) 134 MG capsule Take 1 capsule (134 mg total) by mouth daily (blood fats). 06/24/22   Wilkinson, Dana E, FNP  magnesium  oxide (MAG-OX) 400 (240 Mg) MG tablet Take 1 tablet (400 mg total) by mouth every evening. 06/22/23   Juan Knuckles, MD  mupirocin  ointment (BACTROBAN ) 2 % Apply a small amount to affected area once a day with bandage changes. 06/06/23     Omega-3 Fatty Acids (FISH OIL) 1000 MG CAPS Take 1,000 mg by mouth every morning.     [provider]  omeprazole  (PRILOSEC) 40 MG capsule Take 1 capsule (40 mg total) by mouth daily to prevent indigestion & heartburn 04/17/23   Webb, Padonda B, FNP  propranolol  ER (INDERAL  LA) 120 MG 24 hr capsule Take 1 capsule (120 mg total) by mouth daily. For blood pressure and tremor 06/16/23   Juan Knuckles, MD  rosuvastatin  (CRESTOR ) 40 MG tablet Take 1 tablet (40 mg total) by mouth daily for cholesterol 07/14/23   Juan Knuckles, MD    Physical Exam: Vitals:   07/19/23 0108 07/19/23 0130 07/19/23 0302 07/19/23 0500  BP: (!) 134/91 (!) 140/91 (!) 154/90 (!) 170/92  Pulse: 62 63 (!) 48 (!) 53  Resp: 15 14 17 16   Temp: 98.7 F (37.1 C)  98.6 F (37 C)  TempSrc:   Oral   SpO2: 98% 100% 100% 98%  Weight:      Height:        Physical Exam Vitals reviewed.  Constitutional:      General: He is not in acute distress. HENT:     Head: Normocephalic and atraumatic.  Eyes:     Extraocular Movements: Extraocular movements intact.  Cardiovascular:     Rate and Rhythm: Normal rate and regular rhythm.     Pulses: Normal pulses.  Pulmonary:     Effort: Pulmonary effort is normal. No respiratory distress.     Breath sounds: Normal breath sounds. No wheezing or rales.  Abdominal:     General: Bowel sounds are normal. There is no distension.     Palpations: Abdomen is soft.     Tenderness: There is no abdominal tenderness. There is no guarding.  Musculoskeletal:     Cervical back: Normal range of motion.     Right lower leg: No edema.     Left lower leg: No edema.  Skin:    General: Skin is warm and dry.  Neurological:     General: No focal deficit present.     Mental Status: He is alert and oriented to person, place, and time.     Labs on Admission: I have personally reviewed following labs and imaging studies  CBC: Recent Labs  Lab 07/19/23 0121  WBC 12.8*  HGB 17.5*  HCT 47.1  MCV 100.2*  PLT 255   Basic Metabolic Panel: Recent Labs  Lab 07/19/23 0121   NA 137  K 4.2  CL 82*  CO2 30  GLUCOSE 278*  BUN 30*  CREATININE 3.47*  CALCIUM  10.9*   GFR: Estimated Creatinine Clearance: 17.1 mL/min (A) (by C-G formula based on SCr of 3.47 mg/dL (H)). Liver Function Tests: Recent Labs  Lab 07/19/23 0121  AST 31  ALT 22  ALKPHOS 47  BILITOT 1.9*  PROT 6.9  ALBUMIN 4.2   Recent Labs  Lab 07/19/23 0121  LIPASE 42   No results for input(s): AMMONIA in the last 168 hours. Coagulation Profile: No results for input(s): INR, PROTIME in the last 168 hours. Cardiac Enzymes: No results for input(s): CKTOTAL, CKMB, CKMBINDEX, TROPONINI in the last 168 hours. BNP (last 3 results) No results for input(s): PROBNP in the last 8760 hours. HbA1C: No results for input(s): HGBA1C in the last 72 hours. CBG: No results for input(s): GLUCAP in the last 168 hours. Lipid Profile: No results for input(s): CHOL, HDL, LDLCALC, TRIG, CHOLHDL, LDLDIRECT in the last 72 hours. Thyroid  Function Tests: No results for input(s): TSH, T4TOTAL, FREET4, T3FREE, THYROIDAB in the last 72 hours. Anemia Panel: No results for input(s): VITAMINB12, FOLATE, FERRITIN, TIBC, IRON, RETICCTPCT in the last 72 hours. Urine analysis:    Component Value Date/Time   COLORURINE AMBER (A) 06/13/2023 1825   APPEARANCEUR CLEAR 06/13/2023 1825   LABSPEC >1.030 (H) 06/13/2023 1825   PHURINE 5.5 06/13/2023 1825   GLUCOSEU NEGATIVE 06/13/2023 1825   HGBUR NEGATIVE 06/13/2023 1825   BILIRUBINUR MODERATE (A) 06/13/2023 1825   KETONESUR NEGATIVE 06/13/2023 1825   PROTEINUR 100 (A) 06/13/2023 1825   NITRITE POSITIVE (A) 06/13/2023 1825   LEUKOCYTESUR NEGATIVE 06/13/2023 1825    Radiological Exams on Admission: DG Chest Port 1 View Result Date: 07/19/2023 CLINICAL DATA:  Chest pain EXAM: PORTABLE CHEST 1 VIEW COMPARISON:  Radiograph 02/15/2022 FINDINGS: Stable cardiomediastinal silhouette. Aortic atherosclerotic calcification.  No focal consolidation, pleural effusion, or pneumothorax. No  displaced rib fractures. IMPRESSION: No acute cardiopulmonary disease. Electronically Signed   By: Rozell Cornet M.D.   On: 07/19/2023 01:23    Assessment and Plan  Chest pain Likely GI in etiology as he is reporting a lot of belching.  Recently diagnosed with pancreatic tumor.  Chest x-ray showing no acute cardiopulmonary disease.  Troponin only borderline elevated at 20 and EKG without acute ischemic changes.  Also no relief with nitroglycerin.  Trend troponin and echocardiogram ordered.  Started on IV PPI.  PE less likely given no tachycardia, hypoxia, or clinical signs of DVT on exam.  However, given recent diagnosis of pancreatic tumor, will check D-dimer level.  Nausea/vomiting, possible dyspepsia Recently diagnosed pancreatic tumor Afebrile and no significant leukocytosis.  Abdominal exam benign.  No acute elevation of lipase or LFTs.  No longer actively vomiting and has been able to drink fluids in the ED.  Start IV Protonix 40 mg every 12 hours.  Clear liquid diet for now, advance as tolerated.  Avoid QT prolonging antiemetics.  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  He was referred to general surgery and has an upcoming appointment on 07/26/2023.  AKI on CKD stage II-IIIa Likely secondary to dehydration.  BUN 30, creatinine 3.4 (was 1.7 on 5/6 but previously 1.2 on 04/18/2023).  No renal calculi or hydronephrosis seen on recent MRI.  Continue IV fluid hydration and monitor renal function.  Avoid nephrotoxic agents.  Sinus bradycardia Heart rate recorded in the high 40s/low 50s in the chart.  Not hypotensive.  Hold home beta-blocker.  Patient is currently sitting on bedside chair and no longer on cardiac monitoring.   States he wants to move around in the room.  I have encouraged him to sit back in bed so RN can place cardiac monitoring leads again and we can monitor his heart rate.  TSH checked less than a month ago was normal.  Hyperglycemia Glucose in the 270s.  Last A1c 5.6 in October 2024, repeat ordered.  Placed on sensitive sliding scale insulin .  Hypercalcemia Likely secondary to dehydration and malignancy.  Continue IV fluid hydration and monitor calcium  level.  QT prolongation Potassium is within normal range.  Check magnesium  level and replace if low.  Avoid QT prolonging drugs.  Hypertension Holding propranolol  for now due to concern for bradycardia.  IV hydralazine PRN SBP >160.  COPD Stable, no signs of acute exacerbation.  Does not use inhalers at home.  Pharmacy med rec pending.  DVT prophylaxis: SQ Heparin Code Status: Full Code (discussed with the patient) Level of care: Telemetry bed Admission status: It is my clinical opinion that referral for OBSERVATION is reasonable and necessary in this patient based on the above information provided. The aforementioned taken together are felt to place the patient at high risk for further clinical deterioration. However, it is anticipated that the patient may be medically stable for discharge from the hospital within 24 to 48 hours.  Juliette Oh MD [facility]  If 7PM-7AM, please contact night-coverage [email or web address]  07/19/2023, 6:35 AM

## 2023-07-19 NOTE — ED Notes (Signed)
 Dr Mariella Shore notified of HR 54, asymptomatic.  No new orders received at this time.

## 2023-07-19 NOTE — Progress Notes (Signed)
  Progress Note   Patient: Juan Torres DOB: 1945-11-12 DOA: 07/19/2023     0 DOS: the patient was seen and examined on 07/19/2023   Nonbillable note  Brief hospital course: [name] is a 78 y.o. male with medical history significant of hypertension, CKD stage II-IIIa, COPD, anxiety, depression, GERD, hypertension, hyperlipidemia, prediabetes, OSA not on CPAP, tobacco abuse, prostate cancer in remission.  Patient recently evaluated by his PCP for unintentional weight loss, leukocytosis, and elevated lipase.  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  No evidence of lymphadenopathy or metastatic disease in the abdomen.  He has been referred to general surgery and has an upcoming appointment on 07/26/2023.  Patient comes in this time around with chest pain and nausea vomiting with significant AKI due to dehydration.   EKG showing sinus rhythm, baseline wander, QTc 518, and no acute ischemic changes.  Troponins remain flat.  Chest pain currently resolved. D-dimer still pending Follow-up on echocardiogram We will continue management as outlined in H&P dictated this morning by Dr. Michell Ahumada.    Vitals:   07/19/23 1345 07/19/23 1400 07/19/23 1415 07/19/23 1429  BP: 120/63 (!) 126/53 (!) 119/92   Pulse: (!) 51 (!) 50 (!) 54   Resp: 14 20 14    Temp:    97.7 F (36.5 C)  TempSrc:    Oral  SpO2: 100% 100% 99%   Weight:      Height:        Data Reviewed:    Latest Ref Rng & Units 07/19/2023    1:21 AM 06/13/2023    5:32 PM 05/04/2023   10:46 AM  CBC  WBC 4.0 - 10.5 K/uL 12.8  11.5  5.4   Hemoglobin 13.0 - 17.0 g/dL 60.6  30.1  60.1   Hematocrit 39.0 - 52.0 % 47.1  47.0  42.2   Platelets 150 - 400 K/uL 255  162  271.0        Latest Ref Rng & Units 07/19/2023    8:00 AM 07/19/2023    1:21 AM 06/13/2023    5:32 PM  BMP  Glucose 70 - 99 mg/dL 093  235  573   BUN 8 - 23 mg/dL 35  30  13   Creatinine 0.61 - 1.24 mg/dL  2.20  2.54  2.70   Sodium 135 - 145 mmol/L 136  137  137   Potassium 3.5 - 5.1 mmol/L 4.2  4.2  4.0   Chloride 98 - 111 mmol/L 82  82  103   CO2 22 - 32 mmol/L 29  30  23    Calcium  8.9 - 10.3 mg/dL 9.8  62.3  76.2      Author: Ezzard Holms, MD 07/19/2023 2:58 PM  For on call review [email or web address].

## 2023-07-19 NOTE — ED Notes (Signed)
 CCMD notified pt is being moved to ED 46.

## 2023-07-19 NOTE — Plan of Care (Signed)

## 2023-07-19 NOTE — ED Triage Notes (Signed)
 Pt BIB EMS from home with c/o chest pain since yesterday with N/V an indigestion. 10/10 pain heart rate drops to the 50s. After nitroglycerin, pain decreased to 2/10, heart rate increased to the 70s.   324ASA 0.4mg  nitroglycerin  18g LAC

## 2023-07-19 NOTE — ED Provider Notes (Signed)
 [facility] AT [facility] Provider Note   CSN: [id number] Arrival date & time: 07/19/23  0103     History  Chief Complaint  Patient presents with   Chest Pain    Juan Torres is a 78 y.o. male.  The history is provided by the patient, the EMS personnel and medical records.  Chest Pain Juan Torres is a 78 y.o. male who presents to the Emergency Department complaining of chest pain.  He presents to the emergency department by EMS for evaluation of 2 days of substernal chest pain that is described as severe and constant in nature.  It is nonradiating.  He has associated nausea, vomiting with about 4-5 episodes of emesis daily as well as associated diarrhea.  He has mild shortness of breath and cough.  No fever, abdominal pain, leg swelling or pain.  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  He did have hernia surgery in March of this year.  No history of DVT/PE.  EMS provided 1 sublingual nitroglycerin with improvement of this pain from 10 out of 10 to 2 out of 10.  He states the pain has been preventing him from sleeping.  He did receive aspirin 324 prior to ED arrival.  Home Medications Prior to Admission medications   Medication Sig Start Date End Date Taking? Authorizing Provider  Cholecalciferol (VITAMIN D3) 125 MCG (5000 UT) TABS Take 10,000 Units by mouth every morning.    [provider]  ezetimibe  (ZETIA ) 10 MG tablet Take 1 tablet (10 mg total) by mouth daily for cholesterol. 08/08/22   Wilkinson, Dana E, FNP  famotidine  (PEPCID ) 20 MG tablet Take 1 tablet (20 mg total) by mouth 2 (two) times daily; before breakfast and before dinner 12/11/20   Marie Bureau, NP  fenofibrate  micronized (LOFIBRA) 134 MG capsule Take 1 capsule (134 mg total) by mouth daily (blood fats). 06/24/22   Wilkinson, Dana E, FNP  magnesium  oxide (MAG-OX) 400 (240 Mg) MG tablet  Take 1 tablet (400 mg total) by mouth every evening. 06/22/23   Arcadio Knuckles, MD  mupirocin  ointment (BACTROBAN ) 2 % Apply a small amount to affected area once a day with bandage changes. 06/06/23     Omega-3 Fatty Acids (FISH OIL) 1000 MG CAPS Take 1,000 mg by mouth every morning.    [provider]  omeprazole  (PRILOSEC) 40 MG capsule Take 1 capsule (40 mg total) by mouth daily to prevent indigestion & heartburn 04/17/23   Webb, Padonda B, FNP  propranolol  ER (INDERAL  LA) 120 MG 24 hr capsule Take 1 capsule (120 mg total) by mouth daily. For blood pressure and tremor 06/16/23   Arcadio Knuckles, MD  rosuvastatin  (CRESTOR ) 40 MG tablet Take 1 tablet (40 mg total) by mouth daily for cholesterol 07/14/23   Arcadio Knuckles, MD      Allergies    Lipitor [atorvastatin], Niacin and related, and Penicillins    Review of Systems   Review of Systems  Cardiovascular:  Positive for chest pain.  All other systems reviewed and are negative.   Physical Exam Updated Vital Signs BP (!) 154/90   Pulse (!) 48   Temp 98.6 F (37 C) (Oral)   Resp 17   Ht 6' (1.829 m)   Wt 68 kg   SpO2 100%   BMI 20.33 kg/m  Physical Exam Vitals and nursing note reviewed.  Constitutional:  Appearance: He is well-developed.  HENT:     Head: Normocephalic and atraumatic.  Cardiovascular:     Rate and Rhythm: Normal rate and regular rhythm.     Heart sounds: No murmur heard. Pulmonary:     Effort: Pulmonary effort is normal. No respiratory distress.     Breath sounds: Normal breath sounds.  Abdominal:     Palpations: Abdomen is soft.     Tenderness: There is no abdominal tenderness. There is no guarding or rebound.  Musculoskeletal:        General: No swelling or tenderness.     Comments: 2+ DP pulses bilaterally  Skin:    General: Skin is warm and dry.  Neurological:     Mental Status: He is alert and oriented to person, place, and time.  Psychiatric:        Behavior: Behavior normal.     ED  Results / Procedures / Treatments   Labs (all labs ordered are listed, but only abnormal results are displayed) Labs Reviewed  CBC - Abnormal; Notable for the following components:      Result Value   WBC 12.8 (*)    Hemoglobin 17.5 (*)    MCV 100.2 (*)    MCH 37.2 (*)    MCHC 37.2 (*)    All other components within normal limits  COMPREHENSIVE METABOLIC PANEL WITH GFR - Abnormal; Notable for the following components:   Chloride 82 (*)    Glucose, Bld 278 (*)    BUN 30 (*)    Creatinine, Ser 3.47 (*)    Calcium  10.9 (*)    Total Bilirubin 1.9 (*)    GFR, Estimated 17 (*)    Anion gap 25 (*)    All other components within normal limits  LIPASE, BLOOD  URINALYSIS, ROUTINE W REFLEX MICROSCOPIC  TROPONIN I (HIGH SENSITIVITY)  TROPONIN I (HIGH SENSITIVITY)    EKG EKG Interpretation Date/Time:  Wednesday July 19 2023 01:09:27 EDT Ventricular Rate:  65 PR Interval:  166 QRS Duration:  107 QT Interval:  498 QTC Calculation: 518 R Axis:   75  Text Interpretation: Sinus rhythm Probable left atrial enlargement Minimal ST depression, lateral leads Prolonged QT interval baseline wander Confirmed by Kelsey Patricia [phone]) on 07/19/2023 1:19:48 AM  Radiology DG Chest Port 1 View Result Date: 07/19/2023 CLINICAL DATA:  Chest pain EXAM: PORTABLE CHEST 1 VIEW COMPARISON:  Radiograph 02/15/2022 FINDINGS: Stable cardiomediastinal silhouette. Aortic atherosclerotic calcification. No focal consolidation, pleural effusion, or pneumothorax. No displaced rib fractures. IMPRESSION: No acute cardiopulmonary disease. Electronically Signed   By: Rozell Cornet M.D.   On: 07/19/2023 01:23    Procedures Procedures    Medications Ordered in ED Medications  sodium chloride  0.9 % bolus 500 mL (0 mLs Intravenous Stopped 07/19/23 0331)    ED Course/ Medical Decision Making/ A&P                                 Medical Decision Making Amount and/or Complexity of Data Reviewed Labs:  ordered. Radiology: ordered.  Risk Decision regarding hospitalization.   Patient with history of prostate cancer in remission, recent diagnosis of pancreatic mass that is still undergoing workup here for evaluation of 2 days of vomiting, diarrhea and chest pain.  Initial troponin is negative.  Labs are significant for acute kidney injury.  He was treated with IV fluids.  Pain is well-controlled in the emergency department.  Chest x-ray  is negative for acute abnormality.  Recent MRI was reviewed, no evidence of obstructing renal lesions or stones at that time.  Discussed with patient findings of studies and recommendation for admission given his acute kidney injury.  Suspect this may be secondary to dehydration.  Also recommend further evaluation for his chest pain.  Presentation is not consistent with dissection.  Patient is in agreement with admission.  Hospitalist consulted for admission for ongoing care.        Final Clinical Impression(s) / ED Diagnoses Final diagnoses:  AKI (acute kidney injury) (HCC)  Precordial pain    Rx / DC Orders ED Discharge Orders     None         Kelsey Patricia, MD 07/19/23 0404

## 2023-07-19 NOTE — Progress Notes (Signed)
 CSW added substance abuse resources to patient's AVS.  Edwin Dada, MSW, LCSW Transitions of Care  Clinical Social Worker II [phone]

## 2023-07-20 DIAGNOSIS — R569 Unspecified convulsions: Secondary | ICD-10-CM | POA: Diagnosis not present

## 2023-07-20 DIAGNOSIS — F10931 Alcohol use, unspecified with withdrawal delirium: Secondary | ICD-10-CM | POA: Diagnosis not present

## 2023-07-20 LAB — BASIC METABOLIC PANEL WITH GFR
Anion gap: 11 (ref 5–15)
BUN: 35 mg/dL — ABNORMAL HIGH (ref 8–23)
CO2: 28 mmol/L (ref 22–32)
Calcium: 9.1 mg/dL (ref 8.9–10.3)
Chloride: 96 mmol/L — ABNORMAL LOW (ref 98–111)
Creatinine, Ser: 2.43 mg/dL — ABNORMAL HIGH (ref 0.61–1.24)
GFR, Estimated: 27 mL/min — ABNORMAL LOW (ref >60)
Glucose, Bld: 103 mg/dL — ABNORMAL HIGH (ref 70–99)
Potassium: 3.3 mmol/L — ABNORMAL LOW (ref 3.5–5.1)
Sodium: 135 mmol/L (ref 135–145)

## 2023-07-20 LAB — CBC
HCT: 40.7 % (ref 39.0–52.0)
Hemoglobin: 15 g/dL (ref 13.0–17.0)
MCH: 37.1 pg — ABNORMAL HIGH (ref 26.0–34.0)
MCHC: 36.9 g/dL — ABNORMAL HIGH (ref 30.0–36.0)
MCV: 100.7 fL — ABNORMAL HIGH (ref 80.0–100.0)
Platelets: 166 10*3/uL (ref 150–400)
RBC: 4.04 MIL/uL — ABNORMAL LOW (ref 4.22–5.81)
RDW: 12.2 % (ref 11.5–15.5)
WBC: 11.2 10*3/uL — ABNORMAL HIGH (ref 4.0–10.5)
nRBC: 0 % (ref 0.0–0.2)

## 2023-07-20 LAB — GLUCOSE, CAPILLARY
Glucose-Capillary: 119 mg/dL — ABNORMAL HIGH (ref 70–99)
Glucose-Capillary: 103 mg/dL — ABNORMAL HIGH (ref 70–99)
Glucose-Capillary: 123 mg/dL — ABNORMAL HIGH (ref 70–99)
Glucose-Capillary: 170 mg/dL — ABNORMAL HIGH (ref 70–99)

## 2023-07-20 MED ORDER — SODIUM CHLORIDE 0.9 % IV SOLN: INTRAVENOUS | Status: AC

## 2023-07-20 MED ORDER — FOLIC ACID 5 MG/ML IJ SOLN
1.0000 mg | Freq: Once | INTRAMUSCULAR | Status: AC
  Administered 2023-07-20: 1 mg via INTRAVENOUS
  Filled 2023-07-20: qty 0.2

## 2023-07-20 NOTE — Progress Notes (Signed)
 Please call son Lenix Kidd with any updates [phone]

## 2023-07-20 NOTE — Progress Notes (Signed)
 MD notified due to patients increase agitation and confusion after administering 2mg  Ativan. At first patient seemed to be getting better after the 2mg  of Ativan, but then symptoms began to get worse.  Patient was noted to be actively withdrawing signs and symptoms of sweats, hallucinations and increase anxiety.   MD ordered to do another CIWA score and administer ativan per protocol .  0010 CIWA score was assessed again patient scored a 19. 3mg  of Ativan was given and mittens applied to patient.

## 2023-07-20 NOTE — Plan of Care (Signed)
  Problem: Education: Goal: Ability to describe self-care measures that may prevent or decrease complications (Diabetes Survival Skills Education) will improve Outcome: Progressing   Problem: Pain Managment: Goal: General experience of comfort will improve and/or be controlled Outcome: Progressing   Problem: Safety: Goal: Ability to remain free from injury will improve Outcome: Progressing

## 2023-07-20 NOTE — Progress Notes (Signed)
 Progress Note   Patient: Juan Torres ZOX:[id number] DOB: [birth year] DOA: 07/19/2023     1 DOS: the patient was seen and examined on 07/20/2023   Brief hospital course:  Juan Torres is a 78 y.o. male with medical history significant of hypertension, CKD stage II-IIIa, COPD, anxiety, depression, GERD, hypertension, hyperlipidemia, prediabetes, OSA not on CPAP, tobacco abuse, prostate cancer in remission.  Patient recently evaluated by his PCP for unintentional weight loss, leukocytosis, and elevated lipase.  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  No evidence of lymphadenopathy or metastatic disease in the abdomen.  He has been referred to general surgery and has an upcoming appointment on 07/26/2023.   Patient presents to the ED via EMS tonight for evaluation of chest pain, nausea, and vomiting.  He was given aspirin and nitroglycerin by EMS.  In the ED, patient bradycardic with heart rate in the high 40s/low 50s but not hypotensive.  Afebrile.  Not tachypneic or hypoxic.  Labs notable for WBC count 12.8, hemoglobin 17.5, chloride 82, bicarb 30, glucose 278, BUN 30, creatinine 3.4 (was 1.7 on 5/6 but previously 1.2 on 04/18/2023), calcium  10.9, albumin 4.2, T. bili 1.9 (previously 2.0 on labs a month ago) transaminases and alkaline phosphatase normal, lipase normal, UA pending, troponin 20.  EKG showing sinus rhythm, baseline wander, QTc 518, and no acute ischemic changes.  Chest x-ray showing no acute cardiopulmonary disease.  Patient was given 500 mL normal saline.  TRH called to admit.   Patient is a poor historian.  He is reporting 1 week history of substernal chest pain, nausea, vomiting, and diarrhea.  He denies any abdominal pain.  Vomiting and diarrhea stopped 3 days ago but continues to have chest pain and belching every time he tries to eat or drink.  He has been able to drink fluids in the ED without vomiting.  Denies fevers or chills.   Reports chronic dyspnea on exertion and no worsening shortness of breath.  No other complaints.    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  BUN 30, creatinine 3.4 (was 1.7 on 5/6 but previously 1.2 on 04/18/2023).  No renal calculi or hydronephrosis seen on recent MRI.   Continue IV fluid Monitor renal function   Nausea/vomiting, possible dyspepsia Recently diagnosed pancreatic tumor Nausea and vomiting resolved Continue IV Protonix 40 mg every 12 hours.   Clear liquid diet for now, advance as tolerated.  Avoid QT prolonging antiemetics.  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  He was referred to general surgery and has an upcoming appointment on 07/26/2023.   Chest pain-resolved Likely GI in etiology as he is reporting a lot of belching.  Recently diagnosed with pancreatic tumor.  Chest x-ray showing no acute cardiopulmonary disease.  Troponin only borderline elevated at 20 and EKG without acute ischemic changes.  Echocardiogram showing EF 60 to 65% with no diastolic dysfunction   Sinus bradycardia-improved Continue to hold home beta-blocker.   Monitor closely on telemetry TSH within normal limit   Hyperglycemia Glucose in the 270s.  Last A1c 5.6 in October 2024, repeat ordered.  Placed on sensitive sliding scale  insulin .   Hypercalcemia Likely secondary to dehydration and malignancy.  Continue IV fluid hydration and monitor calcium  level.   QT prolongation Potassium is within normal range.  Check magnesium  level and replace if low.  Avoid QT  prolonging drugs.   Hypertension Holding propranolol  for now due to concern for bradycardia.  IV hydralazine PRN SBP >160.   COPD Stable, no signs of acute exacerbation.  Does not use inhalers at home.    DVT prophylaxis: SQ Heparin Code Status: Full Code (discussed with the patient) Level of care: Telemetry bed    Subjective:  Patient drowsy overnight became agitated and aggressive exhibiting symptoms of alcohol withdrawal on top of his underlying dementia Unable to provide any subjective information this morning  Physical Exam: Constitutional:      General: He is not in acute distress. HENT:     Head: Normocephalic and atraumatic.  Eyes:     Extraocular Movements: Extraocular movements intact.  Cardiovascular:     Rate and Rhythm: Normal rate and regular rhythm.     Pulses: Normal pulses.  Pulmonary:     Effort: Pulmonary effort is normal. No respiratory distress.     Breath sounds: Normal breath sounds. No wheezing or rales.  Abdominal:     General: Bowel sounds are normal. There is no distension.     Palpations: Abdomen is soft.     Tenderness: There is no abdominal tenderness. There is no guarding.  Musculoskeletal:     Cervical back: Normal range of motion.     Right lower leg: No edema.     Left lower leg: No edema.  Skin:    General: Skin is warm and dry.  Neurological:     General: No focal deficit present.     Mental Status: He is alert and oriented to person, place, and time.      Vitals:   07/20/23 0800 07/20/23 0915 07/20/23 1201 07/20/23 1500  BP: 128/79 102/71 108/64 113/68  Pulse: (!) 57 60 (!) 54 76  Resp:  18    Temp:  97.9 F (36.6 C)    TempSrc:  Oral    SpO2: 95% 94% 97% 97%  Weight:      Height:        Data Reviewed:     Latest Ref Rng & Units 07/20/2023   10:52 AM 07/19/2023    4:06 PM 07/19/2023    1:21 AM  CBC  WBC 4.0 - 10.5 K/uL 11.2  14.2  12.8   Hemoglobin 13.0 - 17.0 g/dL 01.0  27.2  53.6   Hematocrit 39.0 - 52.0 % 40.7   40.5  47.1   Platelets 150 - 400 K/uL 166  209  255        Latest Ref Rng & Units 07/20/2023   10:53 AM 07/19/2023    8:00 AM 07/19/2023    1:21 AM  BMP  Glucose 70 - 99 mg/dL 644  034  742   BUN 8 - 23 mg/dL 35  35  30   Creatinine 0.61 - 1.24 mg/dL 5.95  6.38  7.56   Sodium 135 - 145 mmol/L 135  136  137   Potassium 3.5 - 5.1 mmol/L 3.3  4.2  4.2   Chloride 98 - 111 mmol/L 96  82  82   CO2 22 - 32 mmol/L 28  29  30    Calcium  8.9 - 10.3 mg/dL 9.1  9.8  43.3      Family Communication: Discussed  with patient's son over the phone  Disposition: Status is: Inpatient  Time spent: 55 minutes  Author: Ezzard Holms, MD 07/20/2023 4:40 PM  For on call review [email or web address].

## 2023-07-21 ENCOUNTER — Inpatient Hospital Stay (HOSPITAL_COMMUNITY)

## 2023-07-21 LAB — BLOOD GAS, ARTERIAL
Acid-Base Excess: 5.4 mmol/L — ABNORMAL HIGH (ref 0.0–2.0)
Bicarbonate: 29 mmol/L — ABNORMAL HIGH (ref 20.0–28.0)
Drawn by: 137461
O2 Saturation: 98 %
Patient temperature: 36.6
pCO2 arterial: 37 mmHg (ref 32–48)
pH, Arterial: 7.5 — ABNORMAL HIGH (ref 7.35–7.45)
pO2, Arterial: 78 mmHg — ABNORMAL LOW (ref 83–108)

## 2023-07-21 LAB — CBC
HCT: 37.9 % — ABNORMAL LOW (ref 39.0–52.0)
Hemoglobin: 13.8 g/dL (ref 13.0–17.0)
MCH: 36.8 pg — ABNORMAL HIGH (ref 26.0–34.0)
MCHC: 36.4 g/dL — ABNORMAL HIGH (ref 30.0–36.0)
MCV: 101.1 fL — ABNORMAL HIGH (ref 80.0–100.0)
Platelets: 171 10*3/uL (ref 150–400)
RBC: 3.75 MIL/uL — ABNORMAL LOW (ref 4.22–5.81)
RDW: 12.1 % (ref 11.5–15.5)
WBC: 8.7 10*3/uL (ref 4.0–10.5)
nRBC: 0 % (ref 0.0–0.2)

## 2023-07-21 LAB — MAGNESIUM: Magnesium: 1.4 mg/dL — ABNORMAL LOW (ref 1.7–2.4)

## 2023-07-21 LAB — BASIC METABOLIC PANEL WITH GFR
Anion gap: 11 (ref 5–15)
BUN: 26 mg/dL — ABNORMAL HIGH (ref 8–23)
CO2: 27 mmol/L (ref 22–32)
Calcium: 8.7 mg/dL — ABNORMAL LOW (ref 8.9–10.3)
Chloride: 100 mmol/L (ref 98–111)
Creatinine, Ser: 1.7 mg/dL — ABNORMAL HIGH (ref 0.61–1.24)
GFR, Estimated: 41 mL/min — ABNORMAL LOW (ref >60)
Glucose, Bld: 92 mg/dL (ref 70–99)
Potassium: 2.9 mmol/L — ABNORMAL LOW (ref 3.5–5.1)
Sodium: 138 mmol/L (ref 135–145)

## 2023-07-21 LAB — GLUCOSE, CAPILLARY
Glucose-Capillary: 89 mg/dL (ref 70–99)
Glucose-Capillary: 170 mg/dL — ABNORMAL HIGH (ref 70–99)
Glucose-Capillary: 131 mg/dL — ABNORMAL HIGH (ref 70–99)
Glucose-Capillary: 164 mg/dL — ABNORMAL HIGH (ref 70–99)

## 2023-07-21 LAB — POTASSIUM: Potassium: 3.5 mmol/L (ref 3.5–5.1)

## 2023-07-21 MED ORDER — SUCRALFATE 1 GM/10ML PO SUSP
1.0000 g | Freq: Three times a day (TID) | ORAL | Status: DC
  Administered 2023-07-22 – 2023-07-23 (×4): 1 g via ORAL
  Filled 2023-07-21 (×8): qty 10

## 2023-07-21 MED ORDER — POTASSIUM CHLORIDE CRYS ER 20 MEQ PO TBCR
40.0000 meq | EXTENDED_RELEASE_TABLET | ORAL | Status: AC
  Administered 2023-07-21 (×2): 40 meq via ORAL
  Filled 2023-07-21 (×2): qty 2

## 2023-07-21 MED ORDER — SODIUM CHLORIDE 0.9 % IV SOLN: INTRAVENOUS | Status: AC

## 2023-07-21 MED ORDER — MAGNESIUM SULFATE 2 GM/50ML IV SOLN
2.0000 g | Freq: Once | INTRAVENOUS | Status: AC
  Filled 2023-07-21: qty 50

## 2023-07-21 NOTE — Plan of Care (Signed)
  Problem: Education: Goal: Ability to describe self-care measures that may prevent or decrease complications (Diabetes Survival Skills Education) will improve Outcome: Progressing   Problem: Coping: Goal: Ability to adjust to condition or change in health will improve Outcome: Progressing   Problem: Skin Integrity: Goal: Risk for impaired skin integrity will decrease Outcome: Progressing   Problem: Clinical Measurements: Goal: Respiratory complications will improve Outcome: Progressing Goal: Cardiovascular complication will be avoided Outcome: Progressing   Problem: Activity: Goal: Risk for activity intolerance will decrease Outcome: Progressing

## 2023-07-21 NOTE — Progress Notes (Signed)
 Progress Note   Patient: Juan Torres UJW:[id number] DOB: 1945-11-18 DOA: 07/19/2023     2 DOS: the patient was seen and examined on 07/21/2023     Brief hospital course:  Juan Torres is a 78 y.o. male with medical history significant of hypertension, CKD stage II-IIIa, COPD, anxiety, depression, GERD, hypertension, hyperlipidemia, prediabetes, OSA not on CPAP, tobacco abuse, prostate cancer in remission.  Patient recently evaluated by his PCP for unintentional weight loss, leukocytosis, and elevated lipase.  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  No evidence of lymphadenopathy or metastatic disease in the abdomen.  He has been referred to general surgery and has an upcoming appointment on 07/26/2023.   Patient presents to the ED via EMS for evaluation of chest pain, nausea, and vomiting.  Patient was found to be in acute renal failure with creatinine 3.4 (was 1.7 on 5/6 but previously 1.2 on 04/18/2023) also with alcohol withdrawal syndrome.  TRH therefore contacted for admission.     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  BUN 30, creatinine 3.4 (was 1.7 on 5/6 but previously 1.2 on 04/18/2023).  No renal calculi or hydronephrosis seen on recent MRI.   Continue IV fluid Monitor renal function   Nausea/vomiting, possible dyspepsia-improved Recently diagnosed pancreatic tumor Nausea and vomiting resolved Continue IV Protonix  40 mg every 12 hours.   Clear liquid diet for now, advance as tolerated.  Avoid QT prolonging antiemetics.  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  He was referred to general surgery and has an upcoming appointment on 07/26/2023.     Chest pain-resolved Likely GI in etiology as he is reporting a lot of belching.  Recently diagnosed with pancreatic tumor.  Chest x-ray showing no acute cardiopulmonary disease.  Troponin only borderline elevated at 20 and EKG without acute ischemic changes.  Echocardiogram showing EF 60 to 65% with no diastolic dysfunction     Sinus bradycardia-improved Continue to hold home beta-blocker.   Monitor closely on telemetry TSH within normal limit   Hyperglycemia Glucose in the 270s.  Last A1c 5.6 in October 2024, repeat ordered.  Placed on sensitive sliding scale insulin .   Hypercalcemia-improved Likely secondary to dehydration and malignancy.  Continue IV fluid hydration and monitor calcium  level.   QT prolongation Potassium is within normal range.  Check magnesium  level and replace if low.  Avoid QT prolonging drugs.   Hypertension Holding propranolol  for now due to concern for bradycardia.  IV hydralazine  PRN SBP >160.   COPD Stable, no signs of acute exacerbation.  Does not use inhalers at home.     DVT prophylaxis: SQ Heparin  Code Status: Full Code (discussed with the patient) Level of care: Telemetry bed       Subjective:  Patient's mental status slightly better today compared to yesterday Denies nausea vomiting abdominal pain   Physical Exam: Constitutional:      General: He is not in acute distress. HENT:     Head: Normocephalic and atraumatic.  Eyes:     Extraocular Movements: Extraocular movements intact.  Cardiovascular:     Rate and Rhythm: Normal rate and regular rhythm.     Pulses: Normal pulses.  Pulmonary:     Effort: Pulmonary effort is normal. No respiratory distress.     Breath sounds: Normal breath sounds. No  wheezing or rales.  Abdominal:     General: Bowel sounds are normal. There is no distension.     Palpations: Abdomen is soft.     Tenderness: There is no abdominal tenderness. There is no guarding.  Musculoskeletal:     Cervical back: Normal range of motion.     Right lower leg: No edema.     Left lower leg: No edema.  Skin:    General: Skin is warm and dry.  Neurological:     General: No focal deficit present.     Mental Status: He is alert and oriented to person, place, and time.       Family Communication: Discussed with patient's son over the phone   Disposition: Pending clinical course Status is: Inpatient   Time spent: 45 minutes  Data Reviewed:      Latest Ref Rng & Units 07/21/2023    6:33 AM 07/20/2023   10:52 AM 07/19/2023    4:06 PM  CBC  WBC 4.0 - 10.5 K/uL 8.7  11.2  14.2   Hemoglobin 13.0 - 17.0 g/dL 95.2  84.1  32.4   Hematocrit 39.0 - 52.0 % 37.9  40.7  40.5   Platelets 150 - 400 K/uL 171  166  209        Latest Ref Rng & Units 07/21/2023    6:33 AM 07/20/2023   10:53 AM 07/19/2023    8:00 AM  BMP  Glucose 70 - 99 mg/dL 92  401  027   BUN 8 - 23 mg/dL 26  35  35   Creatinine 0.61 - 1.24 mg/dL 2.53  6.64  4.03   Sodium 135 - 145 mmol/L 138  135  136   Potassium 3.5 - 5.1 mmol/L 2.9  3.3  4.2   Chloride 98 - 111 mmol/L 100  96  82   CO2 22 - 32 mmol/L 27  28  29    Calcium  8.9 - 10.3 mg/dL 8.7  9.1  9.8     Vitals:   07/21/23 0507 07/21/23 0700 07/21/23 0806 07/21/23 1421  BP: 119/71  (!) 164/71 (!) 180/73  Pulse: (!) 58  (!) 56 (!) 51  Resp: 18  17 15   Temp: 97.9 F (36.6 C)  97.8 F (36.6 C) 97.7 F (36.5 C)  TempSrc: Oral   Oral  SpO2: 95% 99%    Weight:      Height:         Author: Ezzard Holms, MD 07/21/2023 4:46 PM  For on call review [email or web address].

## 2023-07-22 LAB — GLUCOSE, CAPILLARY
Glucose-Capillary: 111 mg/dL — ABNORMAL HIGH (ref 70–99)
Glucose-Capillary: 112 mg/dL — ABNORMAL HIGH (ref 70–99)
Glucose-Capillary: 113 mg/dL — ABNORMAL HIGH (ref 70–99)
Glucose-Capillary: 120 mg/dL — ABNORMAL HIGH (ref 70–99)

## 2023-07-22 LAB — BASIC METABOLIC PANEL WITH GFR
Anion gap: 11 (ref 5–15)
BUN: 12 mg/dL (ref 8–23)
CO2: 26 mmol/L (ref 22–32)
Calcium: 8.9 mg/dL (ref 8.9–10.3)
Chloride: 103 mmol/L (ref 98–111)
Creatinine, Ser: 1.29 mg/dL — ABNORMAL HIGH (ref 0.61–1.24)
GFR, Estimated: 57 mL/min — ABNORMAL LOW (ref >60)
Glucose, Bld: 113 mg/dL — ABNORMAL HIGH (ref 70–99)
Potassium: 3.8 mmol/L (ref 3.5–5.1)
Sodium: 140 mmol/L (ref 135–145)

## 2023-07-22 LAB — CBC
HCT: 38.9 % — ABNORMAL LOW (ref 39.0–52.0)
Hemoglobin: 14.1 g/dL (ref 13.0–17.0)
MCH: 37.1 pg — ABNORMAL HIGH (ref 26.0–34.0)
MCHC: 36.2 g/dL — ABNORMAL HIGH (ref 30.0–36.0)
MCV: 102.4 fL — ABNORMAL HIGH (ref 80.0–100.0)
Platelets: 178 10*3/uL (ref 150–400)
RBC: 3.8 MIL/uL — ABNORMAL LOW (ref 4.22–5.81)
RDW: 11.9 % (ref 11.5–15.5)
WBC: 7.3 10*3/uL (ref 4.0–10.5)
nRBC: 0 % (ref 0.0–0.2)

## 2023-07-22 MED ORDER — MENTHOL 3 MG MT LOZG
1.0000 | LOZENGE | OROMUCOSAL | Status: DC | PRN
  Administered 2023-07-22: 3 mg via ORAL
  Filled 2023-07-22: qty 9

## 2023-07-22 MED ORDER — PHENOL 1.4 % MT LIQD
1.0000 | OROMUCOSAL | Status: DC | PRN
  Administered 2023-07-22: 1 via OROMUCOSAL
  Filled 2023-07-22: qty 177

## 2023-07-22 MED ORDER — VITAMIN B-12 1000 MCG PO TABS
500.0000 ug | ORAL_TABLET | Freq: Every day | ORAL | Status: DC
  Administered 2023-07-23: 500 ug via ORAL
  Filled 2023-07-22: qty 1

## 2023-07-22 MED ORDER — NICOTINE 14 MG/24HR TD PT24
14.0000 mg | MEDICATED_PATCH | Freq: Every day | TRANSDERMAL | Status: DC
  Administered 2023-07-22 – 2023-07-23 (×2): 14 mg via TRANSDERMAL
  Filled 2023-07-22 (×2): qty 1

## 2023-07-22 MED ORDER — ALUM & MAG HYDROXIDE-SIMETH 200-200-20 MG/5ML PO SUSP
30.0000 mL | Freq: Once | ORAL | Status: AC
  Administered 2023-07-22: 30 mL via ORAL
  Filled 2023-07-22: qty 30

## 2023-07-22 NOTE — Plan of Care (Signed)

## 2023-07-22 NOTE — Progress Notes (Signed)
 Progress Note   Patient: Juan Torres VHQ:[id number] DOB: [birth year] DOA: 07/19/2023     3 DOS: the patient was seen and examined on 07/22/2023     Brief hospital course:  Juan Torres is a 78 y.o. male with medical history significant of hypertension, CKD stage II-IIIa, COPD, anxiety, depression, GERD, hypertension, hyperlipidemia, prediabetes, OSA not on CPAP, tobacco abuse, prostate cancer in remission.  Patient recently evaluated by his PCP for unintentional weight loss, leukocytosis, and elevated lipase.  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  No evidence of lymphadenopathy or metastatic disease in the abdomen.  He has been referred to general surgery and has an upcoming appointment on 07/26/2023.   Patient presents to the ED via EMS for evaluation of chest pain, nausea, and vomiting.  Patient was found to be in acute renal failure with creatinine 3.4 (was 1.7 on 5/6 but previously 1.2 on 04/18/2023) also with alcohol withdrawal syndrome.  TRH therefore contacted for admission.     Assessment and Plan:   Acute metabolic encephalopathy Resolved  Alcohol withdrawal, alcohol use disorder  Patient uses alcohol heavily as an outpatient. Unclear exactly how much he drinks. His spouse thought that he had quit over the last several months. Patient is not forthcoming with this information.  Supplemental information obtained from patient's son CT head w/o evidence of acute intracranial abnormality  Continue CIWA protocol TOC consult Thiamine  folate   Esophagitis in the setting of emesis and chronic alcohol use  Patient reports burning with drinking liquids. This is new this hospitalization.  Continue IV PPI, carafate Likely defer GI evaluation this hospitalization given this is acute in the setting of recent frequent emesis and alcohol use.    AKI on CKD stage II-IIIa-improving Likely secondary to dehydration.  BUN 30, creatinine 3.4  (was 1.7 on 5/6 but previously 1.2 on 04/18/2023).  No renal calculi or hydronephrosis seen on recent MRI.   Continue IV fluid Monitor renal function   Nausea/vomiting-improved Recently diagnosed pancreatic tumor Nausea and vomiting resolved Continue IV Protonix  40 mg every 12 hours.   Clear liquid diet for now, advance as tolerated.  Avoid QT prolonging antiemetics.  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  He was referred to general surgery and has an upcoming appointment on 07/26/2023.    Macrocytosis  Likely due to alcohol use.  Folate >25 2 months ago B12 borderline at that time 242 Supplement b12    Chest pain-resolved Likely GI in etiology as he is reporting a lot of belching.  Recently diagnosed with pancreatic tumor.  Chest x-ray showing no acute cardiopulmonary disease.  Troponin only borderline elevated at 20 and EKG without acute ischemic changes.  Echocardiogram showing EF 60 to 65% with no diastolic dysfunction     Sinus bradycardia-Resolved  Continue to hold home beta-blocker.   Monitor closely on telemetry TSH within normal limit   Hyperglycemia  Last A1c 4.7 this admission.    Hypercalcemia-Resolved.  Likely secondary to dehydration and malignancy.  Continue IV fluid hydration and monitor calcium  level.   QT prolongation Potassium is within normal range.  Check magnesium  level in the AM.  Avoid QT prolonging drugs. Recheck EKG in the AM for QTC   Hypertension Holding propranolol  for now due to concern for bradycardia.  IV  hydralazine  PRN SBP >160.   COPD not in acute exacerbaton Stable, no signs of acute exacerbation.  Does not use inhalers at home.   Tobacco use disoder Smokes 5 black and milds a day.  Nicotine patch    DVT prophylaxis: SQ  Heparin  Code Status: Full Code (discussed with the patient) Level of care: Telemetry bed       Subjective:  Drinking fluids. Has esophagitis. Coffee burns going down. Tuesday night last episode of emesis. No bowel movements since Tuesday.    Physical Exam: Physical Exam  Constitutional: In no distress.  Cardiovascular: Normal rate, regular rhythm. No lower extremity edema  Pulmonary: Non labored breathing on room air, no wheezing or rales.  No lower extremity edema Abdominal: Soft. Normal bowel sounds. Non distended and non tender Musculoskeletal: Normal range of motion.     Neurological: Alert and oriented to person, place, and time. Non focal  Skin: Skin is warm and dry.      Family Communication: Discussed with patient's son over the phone   Disposition: Pending clinical course Status is: Inpatient   Time spent: 45 minutes  Data Reviewed:      Latest Ref Rng & Units 07/22/2023    7:09 AM 07/21/2023    6:33 AM 07/20/2023   10:52 AM  CBC  WBC 4.0 - 10.5 K/uL 7.3  8.7  11.2   Hemoglobin 13.0 - 17.0 g/dL 28.4  13.2  44.0   Hematocrit 39.0 - 52.0 % 38.9  37.9  40.7   Platelets 150 - 400 K/uL 178  171  166        Latest Ref Rng & Units 07/22/2023    7:09 AM 07/21/2023    6:05 PM 07/21/2023    6:33 AM  BMP  Glucose 70 - 99 mg/dL 102   92   BUN 8 - 23 mg/dL 12   26   Creatinine 7.25 - 1.24 mg/dL 3.66   4.40   Sodium 347 - 145 mmol/L 140   138   Potassium 3.5 - 5.1 mmol/L 3.8  3.5  2.9   Chloride 98 - 111 mmol/L 103   100   CO2 22 - 32 mmol/L 26   27   Calcium  8.9 - 10.3 mg/dL 8.9   8.7     Vitals:   07/21/23 1755 07/21/23 1937 07/22/23 0505 07/22/23 0734  BP: 132/72 123/73 117/65 (!) 153/78  Pulse: 60 62 (!) 54 (!) 54  Resp:  18 18 18   Temp:  97.9 F (36.6 C) 97.9 F (36.6 C) 99 F (37.2 C)  TempSrc:      SpO2: 99% 99% 99% 100%  Weight:      Height:         Author: Joette Mustard, MD 07/22/2023 9:23 AM  For on call review [email or web address].

## 2023-07-22 NOTE — Evaluation (Signed)
 Physical Therapy Evaluation Patient Details Name: Juan Torres MRN: [id number] DOB: 1945-10-08 Today's Date: 07/22/2023  History of Present Illness  Pt is a 78 y.o. male admitted 6/11 with c/o of chest pain, nausea, & vomiting. Labs showed AKI.PMH: HTN, prostate cancer, possible neuroendocrine tumor, COPD, tobacco abuse, CKD, OSA  Clinical Impression  Pt admitted with above diagnosis. Lives at home with wife, in a 2-level home with a few steps to enter; Prior to admission, pt was able to manage ambulation modified independently, using an assistive device prn; Presents to PT with generalized weakness and incr fall risk; Tends to depend on UE support for balance with upright activity;  Anticipate good progress as he improves medically; Pt currently with functional limitations due to the deficits listed below (see PT Problem List). Pt will benefit from skilled PT to increase their independence and safety with mobility to allow discharge to the venue listed below.           If plan is discharge home, recommend the following: A little help with walking and/or transfers;Assistance with cooking/housework;Help with stairs or ramp for entrance   Can travel by private vehicle        Equipment Recommendations Rolling walker (2 wheels);BSC/3in1  Recommendations for Other Services       Functional Status Assessment Patient has had a recent decline in their functional status and demonstrates the ability to make significant improvements in function in a reasonable and predictable amount of time.     Precautions / Restrictions Precautions Precautions: Fall Recall of Precautions/Restrictions: Impaired Restrictions Weight Bearing Restrictions Per Provider Order: No      Mobility  Bed Mobility Overal bed mobility: Modified Independent             General bed mobility comments: Increased time, little use of bed features    Transfers Overall transfer level: Needs assistance Equipment used:  Rolling walker (2 wheels), None Transfers: Sit to/from Stand, Bed to chair/wheelchair/BSC Sit to Stand: Supervision   Step pivot transfers: Supervision       General transfer comment: with RW pt supervision level, with no AD pt unsteady reaching for UE support    Ambulation/Gait Ambulation/Gait assistance: Contact guard assist Gait Distance (Feet): 5 Feet Assistive device: None Gait Pattern/deviations: Decreased stride length       General Gait Details: Tending to reach out for UE support  Stairs            Wheelchair Mobility     Tilt Bed    Modified Rankin (Stroke Patients Only)       Balance Overall balance assessment: Needs assistance Sitting-balance support: No upper extremity supported, Feet supported Sitting balance-Leahy Scale: Fair     Standing balance support: Bilateral upper extremity supported, During functional activity, Reliant on assistive device for balance Standing balance-Leahy Scale: Poor Standing balance comment: UE s for support                             Pertinent Vitals/Pain Pain Assessment Pain Assessment: No/denies pain    Home Living Family/patient expects to be discharged to:: Private residence Living Arrangements: Spouse/significant other Available Help at Discharge: Family;Available 24 hours/day Type of Home: House Home Access: Stairs to enter   [employer] of Steps: 2 Alternate Level Stairs-Number of Steps: 16 Home Layout: Two level;Bed/bath upstairs Home Equipment: Shower seat;Cane - single point;Grab bars - tub/shower      Prior Function Prior Level of Function :  Independent/Modified Independent;Driving             Mobility Comments: No AD       Extremity/Trunk Assessment   Upper Extremity Assessment Upper Extremity Assessment: Defer to OT evaluation    Lower Extremity Assessment Lower Extremity Assessment: Generalized weakness    Cervical / Trunk Assessment Cervical / Trunk  Assessment: Normal  Communication   Communication Communication: Impaired Factors Affecting Communication: Hearing impaired    Cognition Arousal: Alert Behavior During Therapy: WFL for tasks assessed/performed                             Following commands: Intact       Cueing Cueing Techniques: Verbal cues     General Comments General comments (skin integrity, edema, etc.): O2 sats 100% and HR 64; on room air    Exercises     Assessment/Plan    PT Assessment Patient needs continued PT services  PT Problem List Decreased strength;Decreased activity tolerance;Decreased balance;Decreased coordination;Decreased mobility;Decreased knowledge of use of DME;Decreased safety awareness;Decreased knowledge of precautions       PT Treatment Interventions DME instruction;Gait training;Stair training;Functional mobility training;Therapeutic activities;Therapeutic exercise;Balance training;Neuromuscular re-education;Cognitive remediation;Patient/family education    PT Goals (Current goals can be found in the Care Plan section)  Acute Rehab PT Goals Patient Stated Goal: to eat dinner PT Goal Formulation: With patient Time For Goal Achievement: 08/05/23 Potential to Achieve Goals: Good    Frequency Min 2X/week     Co-evaluation               AM-PAC PT 6 Clicks Mobility  Outcome Measure Help needed turning from your back to your side while in a flat bed without using bedrails?: None Help needed moving from lying on your back to sitting on the side of a flat bed without using bedrails?: None Help needed moving to and from a bed to a chair (including a wheelchair)?: A Little Help needed standing up from a chair using your arms (e.g., wheelchair or bedside chair)?: A Little Help needed to walk in hospital room?: A Little Help needed climbing 3-5 steps with a railing? : A Lot 6 Click Score: 19    End of Session Equipment Utilized During Treatment: Gait  belt Activity Tolerance: Patient tolerated treatment well Patient left: in chair;with call bell/phone within reach;with chair alarm set Nurse Communication: Mobility status PT Visit Diagnosis: Unsteadiness on feet (R26.81)    Time: 2952-8413 PT Time Calculation (min) (ACUTE ONLY): 20 min   Charges:   PT Evaluation $PT Eval Low Complexity: 1 Low   PT General Charges $$ ACUTE PT VISIT: 1 Visit         Darcus Eastern, PT  Acute Rehabilitation Services Office [phone] Secure Chat welcomed   Marcial Setting 07/22/2023, 7:17 PM

## 2023-07-22 NOTE — Evaluation (Signed)
 Occupational Therapy Evaluation Patient Details Name: ITAY MELLA MRN: [id number] DOB: 14-Nov-1945 Today's Date: 07/22/2023   History of Present Illness   Pt is a 78 y.o. male admitted 6/11 with c/o of chest pain, nausea, & vomiting. Labs showed AKI.PMH: HTN, prostate cancer, possible neuroendocrine tumor, COPD, tobacco abuse, CKD, OSA     Clinical Impressions Pt admitted based on above, and was seen based on problem list below. PTA pt was living with his wife andindependent with ADLs and IADLs. Today pt is requiring s for safety for ADLs. Bed mobility was mod I and functional transfers are  s for safety with use of RW.  Noted decreased STM and problem solving skills. Pt with adequate family support available at d/c, no follow up OT or DME needs. OT will continue to follow acutely to maximize functional independence.        If plan is discharge home, recommend the following:   A little help with walking and/or transfers;A little help with bathing/dressing/bathroom;Supervision due to cognitive status     Functional Status Assessment   Patient has had a recent decline in their functional status and demonstrates the ability to make significant improvements in function in a reasonable and predictable amount of time.     Equipment Recommendations   None recommended by OT      Precautions/Restrictions   Precautions Precautions: Fall Recall of Precautions/Restrictions: Impaired Restrictions Weight Bearing Restrictions Per Provider Order: No     Mobility Bed Mobility Overal bed mobility: Modified Independent       General bed mobility comments: Increased time, little use of bed features    Transfers Overall transfer level: Needs assistance Equipment used: Rolling walker (2 wheels), None Transfers: Sit to/from Stand, Bed to chair/wheelchair/BSC Sit to Stand: Supervision     Step pivot transfers: Supervision     General transfer comment: with RW pt supervision  level, with no AD pt unsteady reaching for UE support      Balance Overall balance assessment: Needs assistance Sitting-balance support: No upper extremity supported, Feet supported Sitting balance-Leahy Scale: Fair     Standing balance support: Bilateral upper extremity supported, During functional activity, Reliant on assistive device for balance Standing balance-Leahy Scale: Poor Standing balance comment: Reliant on RW     ADL either performed or assessed with clinical judgement   ADL Overall ADL's : Needs assistance/impaired Eating/Feeding: Set up;Sitting   Grooming: Wash/dry hands;Supervision/safety;Standing Grooming Details (indicate cue type and reason): Standing at sink, supervision for safety     Upper Body Dressing : Set up;Sitting   Lower Body Dressing: Supervision/safety;Sit to/from stand Lower Body Dressing Details (indicate cue type and reason): Able to firgure 4 legs, no LOB in standing Toilet Transfer: Supervision/safety;Rolling walker (2 wheels);Ambulation;Regular Teacher, adult education Details (indicate cue type and reason): S for safety with RW Toileting- Clothing Manipulation and Hygiene: Supervision/safety;Sitting/lateral lean Toileting - Clothing Manipulation Details (indicate cue type and reason): Pt able to wipe while seated     Functional mobility during ADLs: Supervision/safety;Rolling walker (2 wheels) General ADL Comments: S for safety with use of RW     Vision Baseline Vision/History: 0 No visual deficits Vision Assessment?: No apparent visual deficits            Pertinent Vitals/Pain Pain Assessment Pain Assessment: No/denies pain     Extremity/Trunk Assessment Upper Extremity Assessment Upper Extremity Assessment: Generalized weakness   Lower Extremity Assessment Lower Extremity Assessment: Defer to PT evaluation   Cervical / Trunk Assessment Cervical / Trunk  Assessment: Normal   Communication Communication Communication:  Impaired Factors Affecting Communication: Hearing impaired   Cognition Arousal: Alert Behavior During Therapy: WFL for tasks assessed/performed Cognition: Cognition impaired   Orientation impairments: Situation Awareness: Intellectual awareness intact, Online awareness impaired Memory impairment (select all impairments): Short-term memory   Executive functioning impairment (select all impairments): Problem solving OT - Cognition Comments: pt not oriented to situation, aware deficits exist, when asked why he is in the hospital unable to verbalize why         Following commands: Intact       Cueing  General Comments   Cueing Techniques: Verbal cues  Wife present for session and supportive           Home Living Family/patient expects to be discharged to:: Private residence Living Arrangements: Spouse/significant other Available Help at Discharge: Family;Available 24 hours/day Type of Home: House Home Access: Stairs to enter [employer] of Steps: 2   Home Layout: Two level;Bed/bath upstairs Alternate Level Stairs-Number of Steps: 16 Alternate Level Stairs-Rails: None Bathroom Shower/Tub: Chief Strategy Officer: Standard Bathroom Accessibility: Yes How Accessible: Accessible via walker Home Equipment: Shower seat;Cane - single point;Grab bars - tub/shower          Prior Functioning/Environment Prior Level of Function : Independent/Modified Independent;Driving             Mobility Comments: No AD      OT Problem List: Decreased strength;Decreased range of motion;Decreased activity tolerance;Impaired balance (sitting and/or standing);Decreased safety awareness   OT Treatment/Interventions: Self-care/ADL training;Therapeutic exercise;Energy conservation;DME and/or AE instruction;Therapeutic activities;Patient/family education;Balance training      OT Goals(Current goals can be found in the care plan section)   Acute Rehab OT  Goals Patient Stated Goal: To get stronger OT Goal Formulation: With patient Time For Goal Achievement: 08/05/23 Potential to Achieve Goals: Good   OT Frequency:  Min 2X/week       AM-PAC OT 6 Clicks Daily Activity     Outcome Measure Help from another person eating meals?: None Help from another person taking care of personal grooming?: A Little Help from another person toileting, which includes using toliet, bedpan, or urinal?: A Little Help from another person bathing (including washing, rinsing, drying)?: A Little Help from another person to put on and taking off regular upper body clothing?: A Little Help from another person to put on and taking off regular lower body clothing?: A Little 6 Click Score: 19   End of Session Equipment Utilized During Treatment: Gait belt;Rolling walker (2 wheels) Nurse Communication: Mobility status  Activity Tolerance: Patient tolerated treatment well Patient left: in chair;with call bell/phone within reach;with chair alarm set;with family/visitor present  OT Visit Diagnosis: Unsteadiness on feet (R26.81);Other abnormalities of gait and mobility (R26.89);Muscle weakness (generalized) (M62.81)                Time: 1132-1202 OT Time Calculation (min): 30 min Charges:  OT General Charges $OT Visit: 1 Visit OT Evaluation $OT Eval Moderate Complexity: 1 Mod OT Treatments $Self Care/Home Management : 8-22 mins  Delmer Ferraris, OT  Acute Rehabilitation Services Office [phone] Secure chat preferred   Mickael Alamo 07/22/2023, 1:27 PM

## 2023-07-23 DIAGNOSIS — R739 Hyperglycemia, unspecified: Secondary | ICD-10-CM | POA: Diagnosis not present

## 2023-07-23 DIAGNOSIS — N179 Acute kidney failure, unspecified: Principal | ICD-10-CM

## 2023-07-23 DIAGNOSIS — R1013 Epigastric pain: Secondary | ICD-10-CM

## 2023-07-23 LAB — BASIC METABOLIC PANEL WITH GFR
Anion gap: 8 (ref 5–15)
BUN: 10 mg/dL (ref 8–23)
CO2: 26 mmol/L (ref 22–32)
Calcium: 8.9 mg/dL (ref 8.9–10.3)
Chloride: 103 mmol/L (ref 98–111)
Creatinine, Ser: 1.45 mg/dL — ABNORMAL HIGH (ref 0.61–1.24)
GFR, Estimated: 50 mL/min — ABNORMAL LOW (ref >60)
Glucose, Bld: 129 mg/dL — ABNORMAL HIGH (ref 70–99)
Potassium: 3.1 mmol/L — ABNORMAL LOW (ref 3.5–5.1)
Sodium: 137 mmol/L (ref 135–145)

## 2023-07-23 LAB — MAGNESIUM: Magnesium: 1.4 mg/dL — ABNORMAL LOW (ref 1.7–2.4)

## 2023-07-23 LAB — GLUCOSE, CAPILLARY: Glucose-Capillary: 110 mg/dL — ABNORMAL HIGH (ref 70–99)

## 2023-07-23 MED ORDER — FOLIC ACID 1 MG PO TABS: 1.0000 mg | ORAL_TABLET | Freq: Every day | ORAL | 0 refills | Status: DC

## 2023-07-23 MED ORDER — VITAMIN B-1 100 MG PO TABS: 100.0000 mg | ORAL_TABLET | Freq: Every day | ORAL | 0 refills | Status: DC

## 2023-07-23 MED ORDER — LORAZEPAM 2 MG/ML IJ SOLN
0.5000 mg | Freq: Once | INTRAMUSCULAR | Status: AC
  Administered 2023-07-23: 0.5 mg via INTRAVENOUS
  Filled 2023-07-23: qty 1

## 2023-07-23 MED ORDER — FOLIC ACID 1 MG PO TABS: 1.0000 mg | ORAL_TABLET | Freq: Every day | ORAL | 0 refills | Status: AC

## 2023-07-23 MED ORDER — MELATONIN 5 MG PO TABS: 5.0000 mg | ORAL_TABLET | Freq: Every evening | ORAL | Status: DC | PRN

## 2023-07-23 MED ORDER — SUCRALFATE 1 GM/10ML PO SUSP: 1.0000 g | Freq: Three times a day (TID) | ORAL | 0 refills | Status: DC

## 2023-07-23 MED ORDER — CYANOCOBALAMIN 500 MCG PO TABS: 500.0000 ug | ORAL_TABLET | Freq: Every day | ORAL | 0 refills | Status: AC

## 2023-07-23 MED ORDER — NICOTINE 14 MG/24HR TD PT24
14.0000 mg | MEDICATED_PATCH | Freq: Every day | TRANSDERMAL | 0 refills | Status: DC
  Filled 2023-07-24: qty 28, 28d supply, fill #0

## 2023-07-23 MED ORDER — ADULT MULTIVITAMIN W/MINERALS CH: 1.0000 | ORAL_TABLET | Freq: Every day | ORAL | 0 refills | Status: DC

## 2023-07-23 MED ORDER — CYANOCOBALAMIN 500 MCG PO TABS: 500.0000 ug | ORAL_TABLET | Freq: Every day | ORAL | 0 refills | Status: DC

## 2023-07-23 MED ORDER — ADULT MULTIVITAMIN W/MINERALS CH: 1.0000 | ORAL_TABLET | Freq: Every day | ORAL | 0 refills | Status: AC

## 2023-07-23 MED ORDER — VITAMIN B-1 100 MG PO TABS
100.0000 mg | ORAL_TABLET | Freq: Every day | ORAL | 0 refills | Status: AC
Start: 2023-07-24 — End: ?

## 2023-07-23 NOTE — TOC Transition Note (Signed)
 Transition of Care [facility]) - Discharge Note   Patient Details  Name: COURVOISIER HAMBLEN MRN: [id number] Date of Birth: 1945/02/26  [facility]) CM/SW Contact:  Jannine Meo, RN Phone Number: 07/23/2023, 10:41 AM   Clinical Narrative:   Patient is being discharged today. Recommendations for outpatient PT/OT noted. Referral # [id number], for [facility] to assist with balance issues. Contact information placed on AVS.    Final next level of care: Home/Self Care Barriers to Discharge: No Barriers Identified   Patient Goals and CMS Choice            Discharge Placement                       Discharge Plan and Services Additional resources added to the After Visit Summary for                                       Social Drivers of Health (SDOH) Interventions SDOH Screenings   Food Insecurity: No Food Insecurity (07/19/2023)  Housing: Low Risk  (07/19/2023)  Transportation Needs: No Transportation Needs (07/19/2023)  Utilities: Not At Risk (07/19/2023)  Depression (PHQ2-9): Low Risk  (05/04/2023)  Social Connections: Unknown (07/19/2023)  Tobacco Use: High Risk (07/19/2023)     Readmission Risk Interventions     No data to display

## 2023-07-23 NOTE — Progress Notes (Signed)
 Pt has been agitated during the shift. Pt took off his Tele and refuse to stay in his room walking in the hallway. Provider notified and medication offered and sitter orders initiated.

## 2023-07-23 NOTE — Discharge Summary (Addendum)
 Physician Discharge Summary   Patient: Juan Torres MRN: [id number] DOB: January 06, 1946  Admit date:     07/19/2023  Discharge date: 07/23/23  Discharge Physician: Jodeane Mulligan   PCP: Arcadio Knuckles, MD   Recommendations at discharge:    Pt to be discharged home with recommendations for outpatient PT.   If you experience worsening fever, chills, chest pain, shortness of breath, or other concerning symptoms, please call your PCP or go to the emergency department immediately.  Discharge Diagnoses: Principal Problem:   Chest pain Active Problems:   Hyperglycemia   Dyspepsia   AKI (acute kidney injury) (HCC)   QT prolongation  Resolved Problems:   * No resolved hospital problems. *   Hospital Course:   COPE MARTE is a 78 y.o. male with medical history significant of hypertension, CKD stage II-IIIa, COPD, anxiety, depression, GERD, hypertension, hyperlipidemia, prediabetes, OSA not on CPAP, tobacco abuse, prostate cancer in remission.  Patient recently evaluated by his PCP for unintentional weight loss, leukocytosis, and elevated lipase.  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  No evidence of lymphadenopathy or metastatic disease in the abdomen.  He has been referred to general surgery and has an upcoming appointment on 07/26/2023.   Patient presents to the ED via EMS for evaluation of chest pain, nausea, and vomiting.  Patient was found to be in acute renal failure with creatinine 3.4 (was 1.7 on 5/6 but previously 1.2 on 04/18/2023) also with alcohol withdrawal syndrome.  TR[facility] therefore contacted for admission.  Assessment and Plan:  Acute metabolic encephalopathy - Likely multifactorial etiology given heavy alcohol use/withdrawal, dehydration, delirium.  Appears to be resolved, patient pleasant, conversant this morning.  Home withdrawal/alcohol use disorder - Poorly had quit several months ago.  Was on CIWA.  Vitamin  deficiency noted, on thiamine  supplementation.  Esophagitis in the setting of emesis/chronic alcohol use - Improved with PPI/sucralfate.  Will give prescription for p.o. Protonix  as well as Carafate to use as directed.  Acute kidney injury on CKD 3a - Creatinine markedly elevated on presentation.  Throughout the course hospital stay showed improvement back down to baseline.  Appears resolved at this time.  Recent diagnosis of pancreatic neuroendocrine tumor - Patient has outpatient general surgery appointment 07/26/2023  Sinus bradycardia - Home propranolol  on hold.  Appears resolved at this time.  Okay to resume atenolol upon discharge.  Physical debilitation muscle weakness - Evaluated by PT.  Recommendations for outpatient PT   Consultants: None Procedures performed: None Disposition: Home Diet recommendation:  Discharge Diet Orders (From admission, onward)     Start     Ordered   07/23/23 0000  Diet - low sodium heart healthy        07/23/23 1028           Cardiac and Carb modified diet  DISCHARGE MEDICATION: Allergies as of 07/23/2023       Reactions   Lipitor [atorvastatin] Other (See Comments)   fatigued   Niacin And Related Other (See Comments)   Flushing   Penicillins Rash        Medication List     STOP taking these medications    mupirocin  ointment 2 % Commonly known as: BACTROBAN        TAKE these medications    cyanocobalamin  500 MCG tablet Commonly known as: VITAMIN B12 Take 1 tablet (500 mcg total) by mouth daily. Start taking on: July 24, 2023   ezetimibe  10 MG  tablet Commonly known as: ZETIA  Take 1 tablet (10 mg total) by mouth daily for cholesterol.   fenofibrate  micronized 134 MG capsule Commonly known as: LOFIBRA Take 1 capsule (134 mg total) by mouth daily (blood fats).   Fish Oil 1000 MG Caps Take 1,000 mg by mouth every morning.   folic acid  1 MG tablet Commonly known as: FOLVITE  Take 1 tablet (1 mg total) by mouth  daily. Start taking on: July 24, 2023   magnesium  oxide 400 (240 Mg) MG tablet Commonly known as: MAG-OX Take 1 tablet (400 mg total) by mouth every evening.   multivitamin with minerals Tabs tablet Take 1 tablet by mouth daily. Start taking on: July 24, 2023   nicotine 14 mg/24hr patch Commonly known as: NICODERM CQ - dosed in mg/24 hours Place 1 patch (14 mg total) onto the skin daily. Start taking on: July 24, 2023   omeprazole  40 MG capsule Commonly known as: PRILOSEC Take 1 capsule (40 mg total) by mouth daily to prevent indigestion & heartburn   propranolol  ER 120 MG 24 hr capsule Commonly known as: INDERAL  LA Take 1 capsule (120 mg total) by mouth daily. For blood pressure and tremor   rosuvastatin  40 MG tablet Commonly known as: CRESTOR  Take 1 tablet (40 mg total) by mouth daily for cholesterol   sucralfate 1 GM/10ML suspension Commonly known as: CARAFATE Take 10 mLs (1 g total) by mouth 3 (three) times daily with meals.   thiamine  100 MG tablet Commonly known as: Vitamin B-1 Take 1 tablet (100 mg total) by mouth daily. Start taking on: July 24, 2023   Vitamin D3 125 MCG (5000 UT) Tabs Take 10,000 Units by mouth every morning.         Discharge Exam: Filed Weights   07/19/23 0107  Weight: 68 kg    GENERAL:  Alert, pleasant, no acute distress  HEENT:  EOMI CARDIOVASCULAR:  RRR, no murmurs appreciated RESPIRATORY:  Clear to auscultation, no wheezing, rales, or rhonchi GASTROINTESTINAL:  Soft, nontender, nondistended EXTREMITIES:  No LE edema bilaterally NEURO:  No new focal deficits appreciated SKIN:  No rashes noted PSYCH:  Appropriate mood and affect     Condition at discharge: improving  The results of significant diagnostics from this hospitalization (including imaging, microbiology, ancillary and laboratory) are listed below for reference.   Imaging Studies: CT HEAD WO CONTRAST ( ) Result Date: 07/21/2023 CLINICAL DATA:  Initial  evaluation for acute mental status change. EXAM: CT HEAD WITHOUT CONTRAST TECHNIQUE: Contiguous axial images were obtained from the base of the skull through the vertex without intravenous contrast. RADIATION DOSE REDUCTION: This exam was performed according to the departmental dose-optimization program which includes automated exposure control, adjustment of the mA and/or kV according to patient size and/or use of iterative reconstruction technique. COMPARISON:  None Available. FINDINGS: Brain: Cerebral volume within normal limits. No acute intracranial hemorrhage. No acute large vessel territory infarct. No mass lesion or midline shift. No hydrocephalus or extra-axial fluid collection. Vascular: No abnormal hyperdense vessel. Scattered vascular calcifications noted within the carotid siphons. Skull: Scalp soft tissues within normal limits. Calvarium intact. 1.5 cm sclerotic lesion noted at the right frontal calvarium, nonspecific, but likely benign (series 3, image 31). Sinuses/Orbits: Globes and orbital soft tissues demonstrate no acute finding. Probable remote posttraumatic defect at the left lamina papyracea. Paranasal sinuses are largely clear. No mastoid effusion. Other: None. IMPRESSION: Normal head CT.  No acute intracranial abnormality. Electronically Signed   By: Elenor Griffith.D.  On: 07/21/2023 18:41   ECHOCARDIOGRAM COMPLETE Result Date: 07/19/2023    ECHOCARDIOGRAM REPORT   Patient Name:   JAYDIS DUCHENE Date of Exam: 07/19/2023 Medical Rec #:  [id number]    Height:       72.0 in Accession #:    [id number]   Weight:       149.9 lb Date of Birth:  Sep 17, 1945    BSA:          1.885 m Patient Age:    77 years     BP:           145/71 mmHg Patient Gender: M            HR:           49 bpm. Exam Location:  Inpatient Procedure: 2D Echo, Cardiac Doppler and Color Doppler (Both Spectral and Color            Flow Doppler were utilized during procedure). Indications:    Dyspnea  History:        Patient  has no prior history of Echocardiogram examinations.                 Signs/Symptoms:Dyspnea; Risk Factors:Hypertension.  Sonographer:    Farrell Honey Key Referring Phys: [id number] VASUNDHRA RATHORE IMPRESSIONS  1. Left ventricular ejection fraction, by estimation, is 60 to 65%. The left ventricle has normal function. The left ventricle has no regional wall motion abnormalities. There is mild left ventricular hypertrophy. Left ventricular diastolic parameters were normal.  2. Right ventricular systolic function is normal. The right ventricular size is normal.  3. The mitral valve is grossly normal. Trivial mitral valve regurgitation. No evidence of mitral stenosis.  4. The aortic valve is tricuspid. There is mild calcification of the aortic valve. Aortic valve regurgitation is not visualized. Aortic valve sclerosis is present, with no evidence of aortic valve stenosis.  5. The inferior vena cava is normal in size with greater than 50% respiratory variability, suggesting right atrial pressure of 3 mmHg. Comparison(s): No prior Echocardiogram. Conclusion(s)/Recommendation(s): Normal biventricular function without evidence of hemodynamically significant valvular heart disease. FINDINGS  Left Ventricle: Left ventricular ejection fraction, by estimation, is 60 to 65%. The left ventricle has normal function. The left ventricle has no regional wall motion abnormalities. The left ventricular internal cavity size was normal in size. There is  mild left ventricular hypertrophy. Left ventricular diastolic parameters were normal. Right Ventricle: The right ventricular size is normal. No increase in right ventricular wall thickness. Right ventricular systolic function is normal. Left Atrium: Left atrial size was normal in size. Right Atrium: Right atrial size was normal in size. Pericardium: There is no evidence of pericardial effusion. Mitral Valve: The mitral valve is grossly normal. Trivial mitral valve regurgitation. No evidence of  mitral valve stenosis. Tricuspid Valve: The tricuspid valve is grossly normal. Tricuspid valve regurgitation is trivial. No evidence of tricuspid stenosis. Aortic Valve: The aortic valve is tricuspid. There is mild calcification of the aortic valve. Aortic valve regurgitation is not visualized. Aortic valve sclerosis is present, with no evidence of aortic valve stenosis. Pulmonic Valve: The pulmonic valve was not well visualized. Pulmonic valve regurgitation is not visualized. No evidence of pulmonic stenosis. Aorta: The aortic root and ascending aorta are structurally normal, with no evidence of dilitation. Venous: The inferior vena cava is normal in size with greater than 50% respiratory variability, suggesting right atrial pressure of 3 mmHg. IAS/Shunts: The atrial septum is grossly normal.  LEFT VENTRICLE PLAX  2D LVIDd:         3.70 cm     Diastology LVIDs:         2.50 cm     LV e' medial:    6.53 cm/s LV PW:         1.20 cm     LV E/e' medial:  4.4 LV IVS:        1.20 cm     LV e' lateral:   7.62 cm/s LVOT diam:     1.60 cm     LV E/e' lateral: 3.7 LV SV:         49 LV SV Index:   26 LVOT Area:     2.01 cm  LV Volumes (MOD) LV vol d, MOD A2C: 53.4 ml LV vol d, MOD A4C: 85.0 ml LV vol s, MOD A2C: 17.7 ml LV vol s, MOD A4C: 34.1 ml LV SV MOD A2C:     35.7 ml LV SV MOD A4C:     85.0 ml LV SV MOD BP:      44.7 ml RIGHT VENTRICLE RV Basal diam:  2.80 cm RV S prime:     12.40 cm/s TAPSE (M-mode): 2.2 cm LEFT ATRIUM             Index        RIGHT ATRIUM           Index LA diam:        2.90 cm 1.54 cm/m   RA Area:     12.10 cm LA Vol (A2C):   30.2 ml 16.02 ml/m  RA Volume:   23.80 ml  12.63 ml/m LA Vol (A4C):   31.1 ml 16.50 ml/m LA Biplane Vol: 32.2 ml 17.08 ml/m  AORTIC VALVE LVOT Vmax:   117.00 cm/s LVOT Vmean:  69.700 cm/s LVOT VTI:    0.245 m  AORTA Ao Root diam: 3.30 cm Ao Asc diam:  2.60 cm MITRAL VALVE MV Area (PHT): 2.75 cm    SHUNTS MV Decel Time: 276 msec    Systemic VTI:  0.24 m MV E velocity:  28.48 cm/s  Systemic Diam: 1.60 cm MV A velocity: 62.80 cm/s MV E/A ratio:  0.45 Sheryle Donning MD Electronically signed by Sheryle Donning MD Signature Date/Time: 07/19/2023/6:38:29 PM    Final    DG Chest Port 1 View Result Date: 07/19/2023 CLINICAL DATA:  Chest pain EXAM: PORTABLE CHEST 1 VIEW COMPARISON:  Radiograph 02/15/2022 FINDINGS: Stable cardiomediastinal silhouette. Aortic atherosclerotic calcification. No focal consolidation, pleural effusion, or pneumothorax. No displaced rib fractures. IMPRESSION: No acute cardiopulmonary disease. Electronically Signed   By: Rozell Cornet M.D.   On: 07/19/2023 01:23   MR Abdomen W Wo Contrast Result Date: 07/12/2023 CLINICAL DATA:  Abdominal pain, unexplained weight loss EXAM: MRI ABDOMEN WITHOUT AND WITH CONTRAST TECHNIQUE: Multiplanar multisequence MR imaging of the abdomen was performed both before and after the administration of intravenous contrast. CONTRAST:  Contrast information not available at the time of interpretation. Please see technologist documentation for complete details of contrast agent and intravenous administration. COMPARISON:  None Available. FINDINGS: Lower chest: No acute abnormality. Hepatobiliary: No solid liver abnormality is seen. Multiple fluid signal liver cysts and hemangiomata, benign, requiring no further follow-up or characterization. Mild hepatic steatosis. No gallstones, gallbladder wall thickening, or biliary dilatation. Pancreas: Rounded, homogeneous contrast enhancing mass arising from the dorsal pancreatic uncinate measuring 3.2 x 2.5 cm (series 15, image 46). No pancreatic ductal dilatation or surrounding inflammatory changes. Spleen: Normal  in size without significant abnormality. Adrenals/Urinary Tract: Adrenal glands are unremarkable. Kidneys are normal, without renal calculi, solid lesion, or hydronephrosis. Stomach/Bowel: Stomach is within normal limits. No evidence of bowel wall thickening, distention,  or inflammatory changes. Vascular/Lymphatic: Aortic atherosclerosis. No enlarged abdominal lymph nodes. Other: No abdominal wall hernia or abnormality. No ascites. Musculoskeletal: No acute or significant osseous findings. IMPRESSION: 1. Rounded, homogeneous contrast enhancing mass arising from the dorsal pancreatic uncinate measuring 3.2 x 2.5 cm. 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 2. No evidence of lymphadenopathy or metastatic disease in the abdomen. 3. Mild hepatic steatosis. Aortic Atherosclerosis (ICD10-I70.0). Electronically Signed   By: Fredricka Jenny M.D.   On: 07/12/2023 15:05    Microbiology: Results for orders placed or performed during the hospital encounter of 06/13/23  Gastrointestinal Panel by PCR , Stool     Status: None   Collection Time: 06/13/23  5:56 PM   Specimen: Stool  Result Value Ref Range Status   Campylobacter species NOT DETECTED NOT DETECTED Final   Plesimonas shigelloides NOT DETECTED NOT DETECTED Final   Salmonella species NOT DETECTED NOT DETECTED Final   Yersinia enterocolitica NOT DETECTED NOT DETECTED Final   Vibrio species NOT DETECTED NOT DETECTED Final   Vibrio cholerae NOT DETECTED NOT DETECTED Final   Enteroaggregative E coli (EAEC) NOT DETECTED NOT DETECTED Final   Enteropathogenic E coli (EPEC) NOT DETECTED NOT DETECTED Final   Enterotoxigenic E coli (ETEC) NOT DETECTED NOT DETECTED Final   Shiga like toxin producing E coli (STEC) NOT DETECTED NOT DETECTED Final   Shigella/Enteroinvasive E coli (EIEC) NOT DETECTED NOT DETECTED Final   Cryptosporidium NOT DETECTED NOT DETECTED Final   Cyclospora cayetanensis NOT DETECTED NOT DETECTED Final   Entamoeba histolytica NOT DETECTED NOT DETECTED Final   Giardia lamblia NOT DETECTED NOT DETECTED Final   Adenovirus F40/41 NOT DETECTED NOT DETECTED Final   Astrovirus NOT DETECTED NOT DETECTED Final   Norovirus GI/GII NOT DETECTED NOT DETECTED Final   Rotavirus A NOT DETECTED  NOT DETECTED Final   Sapovirus (I, II, IV, and V) NOT DETECTED NOT DETECTED Final    Comment: Performed at [facility], [address] [place], [place] [address]  C Difficile Quick Screen w PCR reflex     Status: None   Collection Time: 06/13/23  5:57 PM   Specimen: Stool  Result Value Ref Range Status   C Diff antigen NEGATIVE NEGATIVE Final   C Diff toxin NEGATIVE NEGATIVE Final   C Diff interpretation No C. difficile detected.  Final    Comment: Performed at [facility], [address] [place], [place] [address]    Labs: CBC: Recent Labs  Lab 07/19/23 0121 07/19/23 1606 07/20/23 1052 07/21/23 0633 07/22/23 0709  WBC 12.8* 14.2* 11.2* 8.7 7.3  HGB 17.5* 14.9 15.0 13.8 14.1  HCT 47.1 40.5 40.7 37.9* 38.9*  MCV 100.2* 101.8* 100.7* 101.1* 102.4*  PLT 255 209 166 171 178   Basic Metabolic Panel: Recent Labs  Lab 07/19/23 0121 07/19/23 0800 07/20/23 1053 07/21/23 0633 07/21/23 1014 07/21/23 1805 07/22/23 0709  NA 137 136 135 138  --   --  140  K 4.2 4.2 3.3* 2.9*  --  3.5 3.8  CL 82* 82* 96* 100  --   --  103  CO2 30 29 28 27   --   --  26  GLUCOSE 278* 206* 103* 92  --   --  113*  BUN 30* 35* 35*  26*  --   --  12  CREATININE 3.47* 3.89* 2.43* 1.70*  --   --  1.29*  CALCIUM  10.9* 9.8 9.1 8.7*  --   --  8.9  MG  --  1.3*  --   --  1.4*  --   --    Liver Function Tests: Recent Labs  Lab 07/19/23 0121  AST 31  ALT 22  ALKPHOS 47  BILITOT 1.9*  PROT 6.9  ALBUMIN 4.2   CBG: Recent Labs  Lab 07/22/23 0603 07/22/23 1144 07/22/23 1623 07/22/23 2121 07/23/23 0557  GLUCAP 111* 112* 113* 120* 110*    Discharge time spent: 26 minutes.  Length of inpatient stay: 4 days  Signed: Jodeane Mulligan, DO [facility] 07/23/2023

## 2023-07-24 ENCOUNTER — Other Ambulatory Visit (HOSPITAL_COMMUNITY): Payer: Self-pay

## 2023-07-24 ENCOUNTER — Telehealth: Payer: Self-pay

## 2023-07-24 NOTE — Transitions of Care (Post Inpatient/ED Visit) (Signed)
   07/24/2023  Name: Juan Torres MRN: [id number] DOB: 07/20/1945  Today's TOC FU Call Status: Today's TOC FU Call Status:: Unsuccessful Call (1st Attempt) Unsuccessful Call (1st Attempt) Date: 07/24/23  Attempted to reach the patient regarding the most recent Inpatient/ED visit.  Follow Up Plan: Additional outreach attempts will be made to reach the patient to complete the Transitions of Care (Post Inpatient/ED visit) call.   Signature  Germain Kohler, CMA (AAMA)  [facility] [phone]

## 2023-07-28 ENCOUNTER — Telehealth: Payer: Self-pay | Admitting: Gastroenterology

## 2023-07-28 NOTE — Telephone Encounter (Signed)
 I will review when I return to the office. GM

## 2023-07-28 NOTE — Telephone Encounter (Signed)
 Dr. Brice Campi,  Please see referral from Dr. Karleen Overall. Patient is being referred for suspected pancreatic neuroendocrine tumor. EUS.  Referral placed on your desk for review.  Thank you, Trevor Fudge

## 2023-07-29 ENCOUNTER — Other Ambulatory Visit: Payer: Self-pay | Admitting: Family

## 2023-07-29 ENCOUNTER — Other Ambulatory Visit (HOSPITAL_BASED_OUTPATIENT_CLINIC_OR_DEPARTMENT_OTHER): Payer: Self-pay

## 2023-07-29 ENCOUNTER — Other Ambulatory Visit: Payer: Self-pay

## 2023-07-29 DIAGNOSIS — K219 Gastro-esophageal reflux disease without esophagitis: Secondary | ICD-10-CM

## 2023-07-29 NOTE — Telephone Encounter (Signed)
 Please schedule this patient for upper EUS on my hospital week of July 28 to follow my currently scheduled outpatients. Please let Dr. Dasie know when she has been scheduled. Thanks. GM

## 2023-07-31 ENCOUNTER — Other Ambulatory Visit: Payer: Self-pay

## 2023-07-31 ENCOUNTER — Other Ambulatory Visit (HOSPITAL_COMMUNITY): Payer: Self-pay

## 2023-07-31 ENCOUNTER — Other Ambulatory Visit

## 2023-07-31 DIAGNOSIS — D3A8 Other benign neuroendocrine tumors: Secondary | ICD-10-CM

## 2023-07-31 NOTE — Telephone Encounter (Signed)
 EUS has been set up for 09/04/23 at [facility] with GM at 1 pm

## 2023-07-31 NOTE — Telephone Encounter (Signed)
 Left message on machine to call back

## 2023-08-01 NOTE — Telephone Encounter (Signed)
 Sent message to the pt via My Chart- he does view messages.  Call also placed and message left

## 2023-08-02 NOTE — Telephone Encounter (Signed)
 EUS scheduled, pt instructed and medications reviewed.  Patient instructions mailed to home.  Patient to call with any questions or concerns.

## 2023-08-04 ENCOUNTER — Other Ambulatory Visit (HOSPITAL_COMMUNITY): Payer: Self-pay

## 2023-08-07 ENCOUNTER — Ambulatory Visit (INDEPENDENT_AMBULATORY_CARE_PROVIDER_SITE_OTHER)

## 2023-08-07 ENCOUNTER — Other Ambulatory Visit (HOSPITAL_COMMUNITY): Payer: Self-pay

## 2023-08-07 VITALS — Ht 72.0 in | Wt 140.0 lb

## 2023-08-07 DIAGNOSIS — F17200 Nicotine dependence, unspecified, uncomplicated: Secondary | ICD-10-CM

## 2023-08-07 DIAGNOSIS — Z0000 Encounter for general adult medical examination without abnormal findings: Secondary | ICD-10-CM

## 2023-08-07 DIAGNOSIS — Z122 Encounter for screening for malignant neoplasm of respiratory organs: Secondary | ICD-10-CM

## 2023-08-07 NOTE — Progress Notes (Signed)
 Subjective:   Juan Torres is a 78 y.o. who presents for a Medicare Wellness preventive visit.  As a reminder, Annual Wellness Visits don't include a physical exam, and some assessments may be limited, especially if this visit is performed virtually. We may recommend an in-person follow-up visit with your provider if needed.  Visit Complete: Virtual I connected with  Juan Torres on 08/07/23 by a audio enabled telemedicine application and verified that I am speaking with the correct person using two identifiers.  Patient Location: Home  Provider Location: Office/Clinic  I discussed the limitations of evaluation and management by telemedicine. The patient expressed understanding and agreed to proceed.  Vital Signs: Because this visit was a virtual/telehealth visit, some criteria may be missing or patient reported. Any vitals not documented were not able to be obtained and vitals that have been documented are patient reported.  VideoDeclined- This patient declined Librarian, academic. Therefore the visit was completed with audio only.  Persons Participating in Visit: Patient assisted by Juan Torres, Hanken.  AWV Questionnaire: No: Patient Medicare AWV questionnaire was not completed prior to this visit.  Cardiac Risk Factors include: advanced age (>5men, >54 women);dyslipidemia;hypertension;male gender     Objective:    Today's Vitals   08/07/23 1108  Weight: 140 lb (63.5 kg)  Height: 6' (1.829 m)  PainSc: 0-No pain   Body mass index is 18.99 kg/m.     08/07/2023   11:07 AM 07/19/2023    1:07 AM 06/13/2023    5:27 PM 04/18/2023   11:33 AM 01/25/2022    1:52 PM 12/11/2020   11:07 AM 07/30/2019    4:20 PM  Advanced Directives  Does Patient Have a Medical Advance Directive? No No No No No No No  Does patient want to make changes to medical advance directive?       Yes (MAU/Ambulatory/Procedural Areas - Information given)  Would patient like  information on creating a medical advance directive? Yes (MAU/Ambulatory/Procedural Areas - Information given)   No - Patient declined  Yes (MAU/Ambulatory/Procedural Areas - Information given) Yes (MAU/Ambulatory/Procedural Areas - Information given)    Current Medications (verified) Outpatient Encounter Medications as of 08/07/2023  Medication Sig   Cholecalciferol (VITAMIN D3) 125 MCG (5000 UT) TABS Take 10,000 Units by mouth every morning.   cyanocobalamin  (VITAMIN B12) 500 MCG tablet Take 1 tablet (500 mcg total) by mouth daily.   ezetimibe  (ZETIA ) 10 MG tablet Take 1 tablet (10 mg total) by mouth daily for cholesterol.   fenofibrate  micronized (LOFIBRA) 134 MG capsule Take 1 capsule (134 mg total) by mouth daily (blood fats).   folic acid  (FOLVITE ) 1 MG tablet Take 1 tablet (1 mg total) by mouth daily.   magnesium  oxide (MAG-OX) 400 (240 Mg) MG tablet Take 1 tablet (400 mg total) by mouth every evening.   Multiple Vitamin (MULTIVITAMIN WITH MINERALS) TABS tablet Take 1 tablet by mouth daily.   nicotine  (NICODERM CQ  - DOSED IN MG/24 HOURS) 14 mg/24hr patch Place 1 patch (14 mg total) onto the skin daily.   Omega-3 Fatty Acids (FISH OIL) 1000 MG CAPS Take 1,000 mg by mouth every morning.   omeprazole  (PRILOSEC) 40 MG capsule Take 1 capsule (40 mg total) by mouth daily to prevent indigestion & heartburn   propranolol  ER (INDERAL  LA) 120 MG 24 hr capsule Take 1 capsule (120 mg total) by mouth daily. For blood pressure and tremor   rosuvastatin  (CRESTOR ) 40 MG tablet Take 1 tablet (40  mg total) by mouth daily for cholesterol   sucralfate  (CARAFATE ) 1 GM/10ML suspension Take 10 mLs (1 g total) by mouth 3 (three) times daily with meals.   thiamine  (VITAMIN B-1) 100 MG tablet Take 1 tablet (100 mg total) by mouth daily.   No facility-administered encounter medications on file as of 08/07/2023.    Allergies (verified) Lipitor [atorvastatin], Niacin and related, and Penicillins   History: Past  Medical History:  Diagnosis Date   Adenomatous colon polyp    Anxiety    Arthritis    FEET   COPD (chronic obstructive pulmonary disease) (HCC)    Depression    Familial tremor    GERD (gastroesophageal reflux disease)    Headache    When younger   History of basal cell cancer 2012   Hyperlipidemia    Hypertension    Prediabetes    Prostate cancer (HCC) DX 12/19/11   bx=Adenocarcinoma,gleason=3+3=6,volume=42cc,PSA=4.63   PTSD (post-traumatic stress disorder)    Pulmonary nodules 12/10/2018   Numerous unchanged/benign appearing per CT lung 12/2018   Sleep apnea    no cpap   Vitamin D  deficiency    Past Surgical History:  Procedure Laterality Date   COLONOSCOPY W/ POLYPECTOMY     Adenomatous   CYSTOSCOPY N/A 04/04/2012   Procedure: CYSTOSCOPY;  Surgeon: Toribio Neysa Repine, MD;  Location: [facility];  Service: Urology;  Laterality: N/A;   EYE SURGERY     bilateral cataracts    INGUINAL HERNIA REPAIR Bilateral 10/18/2018   Procedure: LAPAROSCOPIC BILATERAL INGUINAL HERNIA REPAIR;  Surgeon: Gladis Cough, MD;  Location: [facility];  Service: General;  Laterality: Bilateral;   INGUINAL HERNIA REPAIR Right 04/25/2023   Procedure: OPEN RIGHT INGUINAL HERNIA REPAIR WITH MESH;  Surgeon: Signe Mitzie LABOR, MD;  Location: [facility];  Service: General;  Laterality: Right;   PROSTATE BIOPSY  12/19/2011   Procedure: BIOPSY TRANSRECTAL ULTRASONIC PROSTATE (TUBP);  Surgeon: Toribio Neysa Repine, MD;  Location: [facility];  Service: Urology;  Laterality: N/A;     RADIOACTIVE SEED IMPLANT N/A 04/04/2012   Procedure: RADIOACTIVE SEED IMPLANT;  Surgeon: Toribio Neysa Repine, MD;  Location: [facility];  Service: Urology;  Laterality: N/A;  Seeds Implanted     72  Seeds Found in Bladder     None    Family History  Problem Relation Age of Onset   Breast cancer Mother    Heart attack Father 39   Dementia Father    Prostate cancer Maternal Uncle         prostate   Ulcers Maternal Grandmother    Cancer Maternal Grandfather        colon   Prostate cancer Cousin        prostate cancer 1st maternal    Sleep apnea Neg Hx    Social History   Socioeconomic History   Marital status: Married    Spouse name: Not on file   Number of children: 2   Years of education: Not on file   Highest education level: Not on file  Occupational History   Not on file  Tobacco Use   Smoking status: Every Day    Current packs/day: 1.00    Average packs/day: 1 pack/day for 34.7 years (34.7 ttl pk-yrs)    Types: Cigarettes, Cigars    Start date: 12/10/1988   Smokeless tobacco: Never   Tobacco comments:    Smokes Black and milds and using a Nicotine   Vaping Use   Vaping  status: Never Used  Substance and Sexual Activity   Alcohol use: Not Currently    Alcohol/week: 1.0 standard drink of alcohol    Types: 1 Shots of liquor per week    Comment: occ   Drug use: Not Currently    Frequency: 7.0 times per week    Types: Marijuana    Comment: / marijuana-last smoked 10/15/2018   Sexual activity: Not Currently  Other Topics Concern   Not on file  Social History Narrative   Lives at home with spouse   Right handed   Caffeine: none   Social Drivers of Corporate investment banker Strain: Low Risk  (08/07/2023)   Overall Financial Resource Strain (CARDIA)    Difficulty of Paying Living Expenses: Not hard at all  Food Insecurity: No Food Insecurity (08/07/2023)   Hunger Vital Sign    Worried About Running Out of Food in the Last Year: Never true    Ran Out of Food in the Last Year: Never true  Transportation Needs: No Transportation Needs (08/07/2023)   PRAPARE - Administrator, Civil Service (Medical): No    Lack of Transportation (Non-Medical): No  Physical Activity: Insufficiently Active (08/07/2023)   Exercise Vital Sign    Days of Exercise per Week: 2 days    Minutes of Exercise per Session: 60 min  Stress: No Stress Concern Present  (08/07/2023)   [employer] of Occupational Health - Occupational Stress Questionnaire    Feeling of Stress: Only a little  Social Connections: Socially Integrated (08/07/2023)   Social Connection and Isolation Panel    Frequency of Communication with Friends and Family: More than three times a week    Frequency of Social Gatherings with Friends and Family: More than three times a week    Attends Religious Services: More than 4 times per year    Active Member of [employer] or Organizations: Yes    Attends Engineer, structural: More than 4 times per year    Marital Status: Married    Tobacco Counseling Ready to quit: Yes Counseling given: Yes Tobacco comments: Smokes Black and milds and using a Nicotine     Clinical Intake:  Pre-visit preparation completed: Yes  Pain : No/denies pain Pain Score: 0-No pain     BMI - recorded: 18.99 Nutritional Status: BMI <19  Underweight Nutritional Risks: None Diabetes: No  Lab Results  Component Value Date   HGBA1C 4.7 (L) 07/19/2023   HGBA1C 5.6 12/01/2022   HGBA1C 5.3 05/31/2022     How often do you need to have someone help you when you read instructions, pamphlets, or other written materials from your doctor or pharmacy?: 1 - Never  Interpreter Needed?: No  Information entered by :: Verdie Saba, CMA   Activities of Daily Living     08/07/2023   11:17 AM 07/19/2023    9:22 PM  In your present state of health, do you have any difficulty performing the following activities:  Hearing? 0 0  Comment does wears hearing aids   Vision? 0   Difficulty concentrating or making decisions? 0   Walking or climbing stairs? 0   Dressing or bathing? 0   Doing errands, shopping? 0   Preparing Food and eating ? N   Using the Toilet? N   In the past six months, have you accidently leaked urine? N   Do you have problems with loss of bowel control? N   Managing your Medications? N   Managing  your Finances? N   Housekeeping or  managing your Housekeeping? N     Patient Care Team: Joshua Debby CROME, MD as PCP - General (Internal Medicine) Jason Charleston, MD (Inactive) as Consulting Physician (Radiation Oncology) Octavia Charleston, MD as Consulting Physician (Ophthalmology) Douglass Lunger, MD as Consulting Physician (Nephrology) Mansouraty, Aloha Raddle., MD as Consulting Physician (Gastroenterology)  I have updated your Care Teams any recent Medical Services you may have received from other providers in the past year.     Assessment:   This is a routine wellness examination for Quran.  Hearing/Vision screen Hearing Screening - Comments:: Wears hearing aids Vision Screening - Comments:: Wears rx glasses - up to date with routine eye exams with Dr Octavia   Goals Addressed               This Visit's Progress     Patient Stated (pt-stated)        Patient stated he plans to continue to walk more - just got home from the hospital.  Now getting his strength back - mowed grass last week       Depression Screen     08/07/2023   11:23 AM 05/04/2023    9:44 AM 12/04/2022    1:32 AM 08/31/2022    1:16 PM 05/31/2022    8:54 PM 01/25/2022    1:53 PM 05/24/2021    9:46 PM  PHQ 2/9 Scores  PHQ - 2 Score 0 0 0 1 0 1 0  PHQ- 9 Score 0 1         Fall Risk     08/07/2023   11:20 AM 05/04/2023    9:44 AM 12/04/2022    1:32 AM 08/31/2022    1:16 PM 05/31/2022    8:54 PM  Fall Risk   Falls in the past year? 1 0 0 0 0  Number falls in past yr: 0 0     Comment 2      Injury with Fall? 0 0     Risk for fall due to : Impaired balance/gait;History of fall(s) No Fall Risks No Fall Risks  No Fall Risks  Follow up Falls evaluation completed;Falls prevention discussed Falls evaluation completed Falls evaluation completed;Education provided;Falls prevention discussed  Falls evaluation completed;Education provided;Falls prevention discussed    MEDICARE RISK AT HOME:  Medicare Risk at Home Any stairs in or around the home?:  Yes If so, are there any without handrails?: Yes Home free of loose throw rugs in walkways, pet beds, electrical cords, etc?: Yes Adequate lighting in your home to reduce risk of falls?: Yes Life alert?: No Use of a cane, walker or w/c?: No Grab bars in the bathroom?: Yes Shower chair or bench in shower?: Yes Elevated toilet seat or a handicapped toilet?: No  TIMED UP AND GO:  Was the test performed?  No  Cognitive Function: 6CIT completed        08/07/2023   11:27 AM  6CIT Screen  What Year? 0 points  What month? 0 points  What time? 0 points  Count back from 20 0 points  Months in reverse 0 points  Repeat phrase 0 points  Total Score 0 points    Immunizations Immunization History  Administered Date(s) Administered   DT (Pediatric) 10/20/2011   PFIZER(Purple Top)SARS-COV-2 Vaccination 04/04/2019, 04/30/2019   Pneumococcal Conjugate-13 11/14/2017   Pneumococcal Polysaccharide-23 04/28/2016   Zoster, Live 10/20/2011    Screening Tests Health Maintenance  Topic Date Due   Zoster Vaccines-  Shingrix (1 of 2) 10[birth year]31/1964   COVID-19 Vaccine (3 - Pfizer risk series) 05/28/2019   DTaP/Tdap/Td (2 - Tdap) 05/03/2024 (Originally 10/19/2021)   INFLUENZA VACCINE  09/08/2023   Lung Cancer Screening  12/30/2023   Medicare Annual Wellness (AWV)  08/06/2024   Pneumococcal Vaccine: 50+ Years  Completed   Hepatitis C Screening  Completed   Hepatitis B Vaccines  Aged Out   HPV VACCINES  Aged Out   Meningococcal B Vaccine  Aged Out   Colonoscopy  Discontinued    Health Maintenance  Health Maintenance Due  Topic Date Due   Zoster Vaccines- Shingrix (1 of 2) 10/31/1964   COVID-19 Vaccine (3 - Pfizer risk series) 05/28/2019   Health Maintenance Items Addressed:  Referral sent to [facility] (smoker/hx smoking)  Additional Screening:  Vision Screening: Recommended annual ophthalmology exams for early detection of glaucoma and other disorders of the eye. Would you  like a referral to an eye doctor? No    Dental Screening: Recommended annual dental exams for proper oral hygiene  Community Resource Referral / Chronic Care Management: CRR required this visit?  No   CCM required this visit?  No   Plan:    I have personally reviewed and noted the following in the patient's chart:   Medical and social history Use of alcohol, tobacco or illicit drugs  Current medications and supplements including opioid prescriptions. Patient is not currently taking opioid prescriptions. Functional ability and status Nutritional status Physical activity Advanced directives List of other physicians Hospitalizations, surgeries, and ER visits in previous 12 months Vitals Screenings to include cognitive, depression, and falls Referrals and appointments  In addition, I have reviewed and discussed with patient certain preventive protocols, quality metrics, and best practice recommendations. A written personalized care plan for preventive services as well as general preventive health recommendations were provided to patient.   Verdie CHRISTELLA Saba, CMA   08/07/2023   After Visit Summary: (MyChart) Due to this being a telephonic visit, the after visit summary with patients personalized plan was offered to patient via MyChart   Notes: Scheduled a Hospital F/U for the pt for 08/24/2023 w/PCP.

## 2023-08-07 NOTE — Patient Instructions (Addendum)
 Juan Torres , Thank you for taking time out of your busy schedule to complete your Annual Wellness Visit with me. I enjoyed our conversation and look forward to speaking with you again next year. I, as well as your care team,  appreciate your ongoing commitment to your health goals. Please review the following plan we discussed and let me know if I can assist you in the future. Your Game plan/ To Do List    Referrals: If you haven't heard from the office you've been referred to, please reach out to them at the phone provided.  Referral to [facility] for a Lung Cancer Screening Follow up Visits: Next Medicare AWV with our clinical staff: 08/08/2024   Have you seen your provider in the last 6 months (3 months if uncontrolled diabetes)? Yes Next Office Visit with your provider: 08/24/2023  Clinician Recommendations:  Aim for 30 minutes of exercise or brisk walking, 6-8 glasses of water , and 5 servings of fruits and vegetables each day. Educated and advised on getting the Shingles vaccines in 2025.      This is a list of the screening recommended for you and due dates:  Health Maintenance  Topic Date Due   Zoster (Shingles) Vaccine (1 of 2) 10/31/1964   COVID-19 Vaccine (3 - Pfizer risk series) 05/28/2019   DTaP/Tdap/Td vaccine (2 - Tdap) 05/03/2024*   Flu Shot  09/08/2023   Screening for Lung Cancer  12/30/2023   Medicare Annual Wellness Visit  08/06/2024   Pneumococcal Vaccine for age over 52  Completed   Hepatitis C Screening  Completed   Hepatitis B Vaccine  Aged Out   HPV Vaccine  Aged Out   Meningitis B Vaccine  Aged Out   Colon Cancer Screening  Discontinued  *Topic was postponed. The date shown is not the original due date.    Advanced directives: (Copy Requested) Please bring a copy of your health care power of attorney and living will to the office to be added to your chart at your convenience. You can mail to [facility] [address] [place], [place]  [address] or email to [email or web address] Advance Care Planning is important because it:  [x]  Makes sure you receive the medical care that is consistent with your values, goals, and preferences  [x]  It provides guidance to your family and loved ones and reduces their decisional burden about whether or not they are making the right decisions based on your wishes.  Follow the link provided in your after visit summary or read over the paperwork we have mailed to you to help you started getting your Advance Directives in place. If you need assistance in completing these, please reach out to us  so that we can help you!

## 2023-08-10 ENCOUNTER — Other Ambulatory Visit (HOSPITAL_COMMUNITY): Payer: Self-pay

## 2023-08-16 ENCOUNTER — Ambulatory Visit: Admitting: Physical Therapy

## 2023-08-24 ENCOUNTER — Ambulatory Visit: Admitting: Internal Medicine

## 2023-08-24 ENCOUNTER — Encounter: Payer: Self-pay | Admitting: Internal Medicine

## 2023-08-24 DIAGNOSIS — I10 Essential (primary) hypertension: Secondary | ICD-10-CM

## 2023-08-24 DIAGNOSIS — R600 Localized edema: Secondary | ICD-10-CM | POA: Diagnosis not present

## 2023-08-24 DIAGNOSIS — N1832 Chronic kidney disease, stage 3b: Secondary | ICD-10-CM

## 2023-08-24 DIAGNOSIS — R739 Hyperglycemia, unspecified: Secondary | ICD-10-CM | POA: Diagnosis not present

## 2023-08-24 DIAGNOSIS — R7989 Other specified abnormal findings of blood chemistry: Secondary | ICD-10-CM | POA: Insufficient documentation

## 2023-08-24 LAB — MAGNESIUM: Magnesium: 1.5 mg/dL (ref 1.5–2.5)

## 2023-08-24 LAB — BASIC METABOLIC PANEL WITH GFR
BUN: 9 mg/dL (ref 6–23)
CO2: 25 meq/L (ref 19–32)
Calcium: 9 mg/dL (ref 8.4–10.5)
Chloride: 110 meq/L (ref 96–112)
Creatinine, Ser: 1.27 mg/dL (ref 0.40–1.50)
GFR: 54.38 mL/min — ABNORMAL LOW (ref >60.00)
Glucose, Bld: 81 mg/dL (ref 70–99)
Potassium: 3.9 meq/L (ref 3.5–5.1)
Sodium: 144 meq/L (ref 135–145)

## 2023-08-24 LAB — D-DIMER, QUANTITATIVE: D-Dimer, Quant: 1.24 ug{FEU}/mL — ABNORMAL HIGH (ref <0.50)

## 2023-08-24 LAB — HEMOGLOBIN A1C: Hgb A1c MFr Bld: 5.4 % (ref 4.6–6.5)

## 2023-08-24 NOTE — Progress Notes (Signed)
 Subjective:  Patient ID: Juan Torres, male    DOB: 06/29/1945  Age: 78 y.o. MRN: [id number]  CC: Hospitalization Follow-up (4 day stay and patient states that he feel better since his release. )   HPI [name] presents for f/up ---  Discussed the use of AI scribe software for clinical note transcription with the patient, who gave verbal consent to proceed.  History of Present Illness   [name] is a 78 year old male who presents with recent hospitalization for dehydration and acute kidney injury following alcohol consumption.  He was hospitalized after experiencing chest pain, vomiting, and diarrhea following heavy alcohol consumption. Symptoms began on a Monday with heavy drinking, leading to vomiting and diarrhea by Tuesday. That night, he experienced chest pain and diaphoresis, prompting a 911 call and hospital admission.  During the hospital stay, he was found to be extremely dehydrated with a creatinine level of 3.89. He received IV fluids and was admitted for further management. He experienced alcohol withdrawal symptoms, requiring Ativan  administration, which led to significant sedation. His creatinine levels gradually improved, reaching 1.2 by Sunday, allowing for discharge.  Since discharge, he has abstained from alcohol and reports improved appetite, gaining six pounds, from 140 to 146 pounds. No current chest pain, nausea, vomiting, or abdominal pain.  He has a history of a neuroendocrine tumor of the pancreas, with a biopsy scheduled on July 28th. He reports new onset ankle swelling.  He is currently taking magnesium  supplements due to previously low levels.       Outpatient Medications Prior to Visit  Medication Sig Dispense Refill   Cholecalciferol (VITAMIN D3) 125 MCG (5000 UT) TABS Take 10,000 Units by mouth every morning.     cyanocobalamin  (VITAMIN B12) 500 MCG tablet Take 1 tablet (500 mcg total) by mouth daily. 30 tablet 0   ezetimibe  (ZETIA ) 10 MG tablet  Take 1 tablet (10 mg total) by mouth daily for cholesterol. 90 tablet 3   fenofibrate  micronized (LOFIBRA) 134 MG capsule Take 1 capsule (134 mg total) by mouth daily (blood fats). 90 capsule 3   folic acid  (FOLVITE ) 1 MG tablet Take 1 tablet (1 mg total) by mouth daily. 30 tablet 0   magnesium  oxide (MAG-OX) 400 (240 Mg) MG tablet Take 1 tablet (400 mg total) by mouth every evening. 120 tablet 0   Multiple Vitamin (MULTIVITAMIN WITH MINERALS) TABS tablet Take 1 tablet by mouth daily. 30 tablet 0   nicotine  (NICODERM CQ  - DOSED IN MG/24 HOURS) 14 mg/24hr patch Place 1 patch (14 mg total) onto the skin daily. 28 patch 0   Omega-3 Fatty Acids (FISH OIL) 1000 MG CAPS Take 1,000 mg by mouth every morning.     omeprazole  (PRILOSEC) 40 MG capsule Take 1 capsule (40 mg total) by mouth daily to prevent indigestion & heartburn 90 capsule 0   propranolol  ER (INDERAL  LA) 120 MG 24 hr capsule Take 1 capsule (120 mg total) by mouth daily. For blood pressure and tremor 90 capsule 1   rosuvastatin  (CRESTOR ) 40 MG tablet Take 1 tablet (40 mg total) by mouth daily for cholesterol 90 tablet 0   sucralfate  (CARAFATE ) 1 GM/10ML suspension Take 10 mLs (1 g total) by mouth 3 (three) times daily with meals. 420 mL 0   thiamine  (VITAMIN B-1) 100 MG tablet Take 1 tablet (100 mg total) by mouth daily. 30 tablet 0   No facility-administered medications prior to visit.    ROS Review of Systems  Constitutional:  Negative for appetite change, chills, diaphoresis, fatigue and fever.  HENT: Negative.  Negative for trouble swallowing.   Eyes: Negative.   Respiratory: Negative.  Negative for cough, chest tightness, shortness of breath and wheezing.   Cardiovascular:  Positive for leg swelling. Negative for chest pain and palpitations.  Gastrointestinal:  Negative for abdominal pain, diarrhea, nausea and vomiting.  Genitourinary: Negative.   Musculoskeletal: Negative.   Skin: Negative.   Neurological:  Negative for  dizziness, weakness and light-headedness.  Hematological:  Does not bruise/bleed easily.  Psychiatric/Behavioral:  Positive for confusion and decreased concentration.     Objective:  BP 136/82 (BP Location: Left Arm, Patient Position: Sitting, Cuff Size: Normal)   Pulse (!) 55   Temp 98.2 F (36.8 C) (Oral)   Ht 6' (1.829 m)   Wt 150 lb 6.4 oz (68.2 kg)   SpO2 98%   BMI 20.40 kg/m   BP Readings from Last 3 Encounters:  08/24/23 136/82  07/23/23 112/86  06/22/23 124/74    Wt Readings from Last 3 Encounters:  08/24/23 150 lb 6.4 oz (68.2 kg)  08/07/23 140 lb (63.5 kg)  07/19/23 149 lb 14.6 oz (68 kg)    Physical Exam Vitals reviewed.  Constitutional:      General: He is not in acute distress.    Appearance: He is ill-appearing. He is not toxic-appearing or diaphoretic.  HENT:     Mouth/Throat:     Mouth: Mucous membranes are moist.  Eyes:     General: No scleral icterus.    Conjunctiva/sclera: Conjunctivae normal.  Cardiovascular:     Rate and Rhythm: Regular rhythm. Bradycardia present.     Heart sounds: No murmur heard.    No friction rub. No gallop.  Pulmonary:     Effort: Pulmonary effort is normal.     Breath sounds: No stridor. No wheezing, rhonchi or rales.  Abdominal:     General: Abdomen is flat.     Palpations: There is no mass.     Tenderness: There is no abdominal tenderness. There is no guarding.     Hernia: No hernia is present.  Musculoskeletal:     Cervical back: Neck supple.     Right lower leg: 1+ Pitting Edema present.     Left lower leg: 1+ Pitting Edema present.  Lymphadenopathy:     Cervical: No cervical adenopathy.  Skin:    General: Skin is warm and dry.  Neurological:     General: No focal deficit present.     Mental Status: He is alert. Mental status is at baseline.  Psychiatric:        Mood and Affect: Mood normal.        Behavior: Behavior normal.     Lab Results  Component Value Date   WBC 7.3 07/22/2023   HGB 14.1  07/22/2023   HCT 38.9 (L) 07/22/2023   PLT 178 07/22/2023   GLUCOSE 81 08/24/2023   CHOL 138 12/01/2022   TRIG 113 12/01/2022   HDL 56 12/01/2022   LDLCALC 62 12/01/2022   ALT 22 07/19/2023   AST 31 07/19/2023   NA 144 08/24/2023   K 3.9 08/24/2023   CL 110 08/24/2023   CREATININE 1.27 08/24/2023   BUN 9 08/24/2023   CO2 25 08/24/2023   TSH 1.55 06/22/2023   PSA 0.00 (L) 06/22/2023   INR 1.0 05/04/2023   HGBA1C 5.4 08/24/2023   MICROALBUR <0.2 05/31/2022    ECHOCARDIOGRAM COMPLETE Result Date: 07/19/2023  ECHOCARDIOGRAM REPORT   Patient Name:   DVONTE GATLIFF Date of Exam: 07/19/2023 Medical Rec #:  [id number]    Height:       72.0 in Accession #:    [id number]   Weight:       149.9 lb Date of Birth:  Jan 17, 1946    BSA:          1.885 m Patient Age:    77 years     BP:           145/71 mmHg Patient Gender: M            HR:           49 bpm. Exam Location:  Inpatient Procedure: 2D Echo, Cardiac Doppler and Color Doppler (Both Spectral and Color            Flow Doppler were utilized during procedure). Indications:    Dyspnea  History:        Patient has no prior history of Echocardiogram examinations.                 Signs/Symptoms:Dyspnea; Risk Factors:Hypertension.  Sonographer:    Vella Key Referring Phys: [id number] VASUNDHRA RATHORE IMPRESSIONS  1. Left ventricular ejection fraction, by estimation, is 60 to 65%. The left ventricle has normal function. The left ventricle has no regional wall motion abnormalities. There is mild left ventricular hypertrophy. Left ventricular diastolic parameters were normal.  2. Right ventricular systolic function is normal. The right ventricular size is normal.  3. The mitral valve is grossly normal. Trivial mitral valve regurgitation. No evidence of mitral stenosis.  4. The aortic valve is tricuspid. There is mild calcification of the aortic valve. Aortic valve regurgitation is not visualized. Aortic valve sclerosis is present, with no evidence of aortic valve  stenosis.  5. The inferior vena cava is normal in size with greater than 50% respiratory variability, suggesting right atrial pressure of 3 mmHg. Comparison(s): No prior Echocardiogram. Conclusion(s)/Recommendation(s): Normal biventricular function without evidence of hemodynamically significant valvular heart disease. FINDINGS  Left Ventricle: Left ventricular ejection fraction, by estimation, is 60 to 65%. The left ventricle has normal function. The left ventricle has no regional wall motion abnormalities. The left ventricular internal cavity size was normal in size. There is  mild left ventricular hypertrophy. Left ventricular diastolic parameters were normal. Right Ventricle: The right ventricular size is normal. No increase in right ventricular wall thickness. Right ventricular systolic function is normal. Left Atrium: Left atrial size was normal in size. Right Atrium: Right atrial size was normal in size. Pericardium: There is no evidence of pericardial effusion. Mitral Valve: The mitral valve is grossly normal. Trivial mitral valve regurgitation. No evidence of mitral valve stenosis. Tricuspid Valve: The tricuspid valve is grossly normal. Tricuspid valve regurgitation is trivial. No evidence of tricuspid stenosis. Aortic Valve: The aortic valve is tricuspid. There is mild calcification of the aortic valve. Aortic valve regurgitation is not visualized. Aortic valve sclerosis is present, with no evidence of aortic valve stenosis. Pulmonic Valve: The pulmonic valve was not well visualized. Pulmonic valve regurgitation is not visualized. No evidence of pulmonic stenosis. Aorta: The aortic root and ascending aorta are structurally normal, with no evidence of dilitation. Venous: The inferior vena cava is normal in size with greater than 50% respiratory variability, suggesting right atrial pressure of 3 mmHg. IAS/Shunts: The atrial septum is grossly normal.  LEFT VENTRICLE PLAX 2D LVIDd:         3.70 cm  Diastology LVIDs:         2.50 cm     LV e' medial:    6.53 cm/s LV PW:         1.20 cm     LV E/e' medial:  4.4 LV IVS:        1.20 cm     LV e' lateral:   7.62 cm/s LVOT diam:     1.60 cm     LV E/e' lateral: 3.7 LV SV:         49 LV SV Index:   26 LVOT Area:     2.01 cm  LV Volumes (MOD) LV vol d, MOD A2C: 53.4 ml LV vol d, MOD A4C: 85.0 ml LV vol s, MOD A2C: 17.7 ml LV vol s, MOD A4C: 34.1 ml LV SV MOD A2C:     35.7 ml LV SV MOD A4C:     85.0 ml LV SV MOD BP:      44.7 ml RIGHT VENTRICLE RV Basal diam:  2.80 cm RV S prime:     12.40 cm/s TAPSE (M-mode): 2.2 cm LEFT ATRIUM             Index        RIGHT ATRIUM           Index LA diam:        2.90 cm 1.54 cm/m   RA Area:     12.10 cm LA Vol (A2C):   30.2 ml 16.02 ml/m  RA Volume:   23.80 ml  12.63 ml/m LA Vol (A4C):   31.1 ml 16.50 ml/m LA Biplane Vol: 32.2 ml 17.08 ml/m  AORTIC VALVE LVOT Vmax:   117.00 cm/s LVOT Vmean:  69.700 cm/s LVOT VTI:    0.245 m  AORTA Ao Root diam: 3.30 cm Ao Asc diam:  2.60 cm MITRAL VALVE MV Area (PHT): 2.75 cm    SHUNTS MV Decel Time: 276 msec    Systemic VTI:  0.24 m MV E velocity: 28.48 cm/s  Systemic Diam: 1.60 cm MV A velocity: 62.80 cm/s MV E/A ratio:  0.45 Shelda Bruckner MD Electronically signed by Shelda Bruckner MD Signature Date/Time: 07/19/2023/6:38:29 PM    Final    DG Chest Port 1 View Result Date: 07/19/2023 CLINICAL DATA:  Chest pain EXAM: PORTABLE CHEST 1 VIEW COMPARISON:  Radiograph 02/15/2022 FINDINGS: Stable cardiomediastinal silhouette. Aortic atherosclerotic calcification. No focal consolidation, pleural effusion, or pneumothorax. No displaced rib fractures. IMPRESSION: No acute cardiopulmonary disease. Electronically Signed   By: Norman Gatlin M.D.   On: 07/19/2023 01:23    Assessment & Plan:  Hypomagnesemia -     Basic metabolic panel with GFR; Future -     Magnesium ; Future  Essential hypertension -     Basic metabolic panel with GFR; Future -     Magnesium ; Future  Edema of  both lower legs -     D-dimer, quantitative; Future -     US  Venous Img Lower Bilateral (DVT); Future  Chronic hyperglycemia -     Hemoglobin A1c  D-dimer, elevated- Will evaluate for DVT. -     US  Venous Img Lower Bilateral (DVT); Future  Stage 3b chronic kidney disease (HCC)- Renal function has improved.     Follow-up: Return in about 6 months (around 02/24/2024).  Debby Molt, MD

## 2023-08-24 NOTE — Patient Instructions (Signed)
Edema  Edema is an abnormal buildup of fluids in the body tissues and under the skin. Swelling of the legs, feet, and ankles is a common symptom that becomes more likely as you get older. Swelling is also common in looser tissues, such as around the eyes. Pressing on the area may make a temporary dent in your skin (pitting edema). This fluid may also accumulate in your lungs (pulmonary edema). There are many possible causes of edema. Eating too much salt (sodium) and being on your feet or sitting for a long time can cause edema in your legs, feet, and ankles. Common causes of edema include: Certain medical conditions, such as heart failure, liver or kidney disease, and cancer. Weak leg blood vessels. An injury. Pregnancy. Medicines. Being obese. Low protein levels in the blood. Hot weather may make edema worse. Edema is usually painless. Your skin may look swollen or shiny. Follow these instructions at home: Medicines Take over-the-counter and prescription medicines only as told by your health care provider. Your health care provider may prescribe a medicine to help your body get rid of extra water (diuretic). Take this medicine if you are told to take it. Eating and drinking Eat a low-salt (low-sodium) diet to reduce fluid as told by your health care provider. Sometimes, eating less salt may reduce swelling. Depending on the cause of your swelling, you may need to limit how much fluid you drink (fluid restriction). General instructions Raise (elevate) the injured area above the level of your heart while you are sitting or lying down. Do not sit still or stand for long periods of time. Do not wear tight clothing. Do not wear garters on your upper legs. Exercise your legs to get your circulation going. This helps to move the fluid back into your blood vessels, and it may help the swelling go down. Wear compression stockings as told by your health care provider. These stockings help to prevent  blood clots and reduce swelling in your legs. It is important that these are the correct size. These stockings should be prescribed by your health care provider to prevent possible injuries. If elastic bandages or wraps are recommended, use them as told by your health care provider. Contact a health care provider if: Your edema does not get better with treatment. You have heart, liver, or kidney disease and have symptoms of edema. You have sudden and unexplained weight gain. Get help right away if: You develop shortness of breath or chest pain. You cannot breathe when you lie down. You develop pain, redness, or warmth in the swollen areas. You have heart, liver, or kidney disease and suddenly get edema. You have a fever and your symptoms suddenly get worse. These symptoms may be an emergency. Get help right away. Call 911. Do not wait to see if the symptoms will go away. Do not drive yourself to the hospital. Summary Edema is an abnormal buildup of fluids in the body tissues and under the skin. Eating too much salt (sodium)and being on your feet or sitting for a long time can cause edema in your legs, feet, and ankles. Raise (elevate) the injured area above the level of your heart while you are sitting or lying down. Follow your health care provider's instructions about diet and how much fluid you can drink. This information is not intended to replace advice given to you by your health care provider. Make sure you discuss any questions you have with your health care provider. Document Revised: 09/28/2020 Document  Reviewed: 09/28/2020 Elsevier Patient Education  2024 [employer].

## 2023-08-25 ENCOUNTER — Encounter: Payer: Self-pay | Admitting: Internal Medicine

## 2023-08-28 ENCOUNTER — Ambulatory Visit
Admission: RE | Admit: 2023-08-28 | Discharge: 2023-08-28 | Disposition: A | Discharge status: Home / Self Care | Source: Ambulatory Visit | Attending: Internal Medicine | Admitting: Internal Medicine

## 2023-08-28 DIAGNOSIS — R600 Localized edema: Secondary | ICD-10-CM

## 2023-08-28 DIAGNOSIS — R7989 Other specified abnormal findings of blood chemistry: Secondary | ICD-10-CM

## 2023-08-29 ENCOUNTER — Telehealth: Payer: Self-pay

## 2023-08-29 ENCOUNTER — Other Ambulatory Visit: Payer: Self-pay | Admitting: Family

## 2023-08-29 ENCOUNTER — Other Ambulatory Visit (HOSPITAL_COMMUNITY): Payer: Self-pay

## 2023-08-29 ENCOUNTER — Encounter (HOSPITAL_COMMUNITY): Payer: Self-pay | Admitting: Gastroenterology

## 2023-08-29 ENCOUNTER — Other Ambulatory Visit: Payer: Self-pay | Admitting: Nurse Practitioner

## 2023-08-29 DIAGNOSIS — E782 Mixed hyperlipidemia: Secondary | ICD-10-CM

## 2023-08-29 DIAGNOSIS — K219 Gastro-esophageal reflux disease without esophagitis: Secondary | ICD-10-CM

## 2023-08-29 NOTE — Progress Notes (Signed)
 Attempted to obtain medical history for pre op call via telephone, unable to reach at this time. HIPAA compliant voicemail message left requesting return call to pre surgical testing department.

## 2023-08-29 NOTE — Telephone Encounter (Signed)
 Procedure:Ultrasound Procedure date: 09/04/23 Procedure location: [facility] Arrival Time: 12pm Spoke with the patient Y/N: yes Any prep concerns? n  Has the patient obtained the prep from the pharmacy ? n Do you have a care partner and transportation: n Any additional concerns? n

## 2023-08-30 ENCOUNTER — Other Ambulatory Visit (HOSPITAL_COMMUNITY): Payer: Self-pay

## 2023-09-01 ENCOUNTER — Other Ambulatory Visit (HOSPITAL_COMMUNITY): Payer: Self-pay

## 2023-09-01 ENCOUNTER — Ambulatory Visit: Payer: Medicare HMO | Admitting: Nurse Practitioner

## 2023-09-03 ENCOUNTER — Other Ambulatory Visit: Payer: Self-pay

## 2023-09-03 ENCOUNTER — Inpatient Hospital Stay (HOSPITAL_COMMUNITY)
Admission: EM | Admit: 2023-09-03 | Discharge: 2023-09-07 | DRG: 682 | Disposition: A | Discharge status: Home / Self Care | Attending: Internal Medicine | Admitting: Internal Medicine

## 2023-09-03 ENCOUNTER — Encounter (HOSPITAL_COMMUNITY): Payer: Self-pay | Admitting: Internal Medicine

## 2023-09-03 DIAGNOSIS — R634 Abnormal weight loss: Secondary | ICD-10-CM

## 2023-09-03 DIAGNOSIS — I10 Essential (primary) hypertension: Secondary | ICD-10-CM | POA: Diagnosis present

## 2023-09-03 DIAGNOSIS — N179 Acute kidney failure, unspecified: Principal | ICD-10-CM | POA: Diagnosis present

## 2023-09-03 DIAGNOSIS — R1310 Dysphagia, unspecified: Secondary | ICD-10-CM

## 2023-09-03 DIAGNOSIS — N183 Chronic kidney disease, stage 3 unspecified: Secondary | ICD-10-CM | POA: Diagnosis present

## 2023-09-03 DIAGNOSIS — F1027 Alcohol dependence with alcohol-induced persisting dementia: Secondary | ICD-10-CM | POA: Diagnosis present

## 2023-09-03 DIAGNOSIS — R9431 Abnormal electrocardiogram [ECG] [EKG]: Secondary | ICD-10-CM | POA: Diagnosis present

## 2023-09-03 DIAGNOSIS — Z888 Allergy status to other drugs, medicaments and biological substances status: Secondary | ICD-10-CM

## 2023-09-03 DIAGNOSIS — D3A8 Other benign neuroendocrine tumors: Secondary | ICD-10-CM

## 2023-09-03 DIAGNOSIS — K2211 Ulcer of esophagus with bleeding: Secondary | ICD-10-CM | POA: Diagnosis present

## 2023-09-03 DIAGNOSIS — K869 Disease of pancreas, unspecified: Secondary | ICD-10-CM

## 2023-09-03 DIAGNOSIS — G4733 Obstructive sleep apnea (adult) (pediatric): Secondary | ICD-10-CM | POA: Diagnosis present

## 2023-09-03 DIAGNOSIS — E782 Mixed hyperlipidemia: Secondary | ICD-10-CM | POA: Diagnosis present

## 2023-09-03 DIAGNOSIS — J432 Centrilobular emphysema: Secondary | ICD-10-CM | POA: Diagnosis not present

## 2023-09-03 DIAGNOSIS — F32A Depression, unspecified: Secondary | ICD-10-CM | POA: Diagnosis present

## 2023-09-03 DIAGNOSIS — Z974 Presence of external hearing-aid: Secondary | ICD-10-CM

## 2023-09-03 DIAGNOSIS — F17210 Nicotine dependence, cigarettes, uncomplicated: Secondary | ICD-10-CM | POA: Diagnosis present

## 2023-09-03 DIAGNOSIS — K219 Gastro-esophageal reflux disease without esophagitis: Secondary | ICD-10-CM | POA: Diagnosis not present

## 2023-09-03 DIAGNOSIS — K2100 Gastro-esophageal reflux disease with esophagitis, without bleeding: Secondary | ICD-10-CM | POA: Diagnosis present

## 2023-09-03 DIAGNOSIS — Z880 Allergy status to penicillin: Secondary | ICD-10-CM

## 2023-09-03 DIAGNOSIS — Z8249 Family history of ischemic heart disease and other diseases of the circulatory system: Secondary | ICD-10-CM

## 2023-09-03 DIAGNOSIS — K3189 Other diseases of stomach and duodenum: Secondary | ICD-10-CM | POA: Diagnosis present

## 2023-09-03 DIAGNOSIS — D72829 Elevated white blood cell count, unspecified: Secondary | ICD-10-CM | POA: Diagnosis present

## 2023-09-03 DIAGNOSIS — K2981 Duodenitis with bleeding: Secondary | ICD-10-CM | POA: Diagnosis present

## 2023-09-03 DIAGNOSIS — F4310 Post-traumatic stress disorder, unspecified: Secondary | ICD-10-CM | POA: Diagnosis present

## 2023-09-03 DIAGNOSIS — N1832 Chronic kidney disease, stage 3b: Secondary | ICD-10-CM | POA: Diagnosis present

## 2023-09-03 DIAGNOSIS — F10282 Alcohol dependence with alcohol-induced sleep disorder: Secondary | ICD-10-CM | POA: Diagnosis present

## 2023-09-03 DIAGNOSIS — Z8042 Family history of malignant neoplasm of prostate: Secondary | ICD-10-CM

## 2023-09-03 DIAGNOSIS — K2971 Gastritis, unspecified, with bleeding: Secondary | ICD-10-CM | POA: Diagnosis present

## 2023-09-03 DIAGNOSIS — R112 Nausea with vomiting, unspecified: Secondary | ICD-10-CM

## 2023-09-03 DIAGNOSIS — R197 Diarrhea, unspecified: Secondary | ICD-10-CM

## 2023-09-03 DIAGNOSIS — E8729 Other acidosis: Secondary | ICD-10-CM

## 2023-09-03 DIAGNOSIS — K2210 Ulcer of esophagus without bleeding: Secondary | ICD-10-CM

## 2023-09-03 DIAGNOSIS — Z803 Family history of malignant neoplasm of breast: Secondary | ICD-10-CM

## 2023-09-03 DIAGNOSIS — E876 Hypokalemia: Secondary | ICD-10-CM | POA: Diagnosis present

## 2023-09-03 DIAGNOSIS — R1114 Bilious vomiting: Secondary | ICD-10-CM

## 2023-09-03 DIAGNOSIS — K209 Esophagitis, unspecified without bleeding: Secondary | ICD-10-CM

## 2023-09-03 DIAGNOSIS — R935 Abnormal findings on diagnostic imaging of other abdominal regions, including retroperitoneum: Secondary | ICD-10-CM

## 2023-09-03 DIAGNOSIS — Z800 Family history of malignant neoplasm of digestive organs: Secondary | ICD-10-CM

## 2023-09-03 DIAGNOSIS — Z8546 Personal history of malignant neoplasm of prostate: Secondary | ICD-10-CM

## 2023-09-03 DIAGNOSIS — K449 Diaphragmatic hernia without obstruction or gangrene: Secondary | ICD-10-CM | POA: Diagnosis present

## 2023-09-03 DIAGNOSIS — J439 Emphysema, unspecified: Secondary | ICD-10-CM | POA: Diagnosis present

## 2023-09-03 DIAGNOSIS — F17290 Nicotine dependence, other tobacco product, uncomplicated: Secondary | ICD-10-CM | POA: Diagnosis present

## 2023-09-03 DIAGNOSIS — K264 Chronic or unspecified duodenal ulcer with hemorrhage: Secondary | ICD-10-CM | POA: Diagnosis present

## 2023-09-03 DIAGNOSIS — Z79899 Other long term (current) drug therapy: Secondary | ICD-10-CM

## 2023-09-03 DIAGNOSIS — C7A8 Other malignant neuroendocrine tumors: Secondary | ICD-10-CM | POA: Diagnosis present

## 2023-09-03 DIAGNOSIS — E874 Mixed disorder of acid-base balance: Secondary | ICD-10-CM | POA: Diagnosis present

## 2023-09-03 DIAGNOSIS — I472 Ventricular tachycardia, unspecified: Secondary | ICD-10-CM | POA: Diagnosis not present

## 2023-09-03 DIAGNOSIS — E559 Vitamin D deficiency, unspecified: Secondary | ICD-10-CM | POA: Diagnosis present

## 2023-09-03 DIAGNOSIS — Z818 Family history of other mental and behavioral disorders: Secondary | ICD-10-CM

## 2023-09-03 DIAGNOSIS — R627 Adult failure to thrive: Secondary | ICD-10-CM | POA: Diagnosis present

## 2023-09-03 DIAGNOSIS — E669 Obesity, unspecified: Secondary | ICD-10-CM | POA: Diagnosis present

## 2023-09-03 DIAGNOSIS — J90 Pleural effusion, not elsewhere classified: Secondary | ICD-10-CM | POA: Diagnosis present

## 2023-09-03 DIAGNOSIS — I129 Hypertensive chronic kidney disease with stage 1 through stage 4 chronic kidney disease, or unspecified chronic kidney disease: Secondary | ICD-10-CM | POA: Diagnosis present

## 2023-09-03 DIAGNOSIS — N1831 Chronic kidney disease, stage 3a: Secondary | ICD-10-CM | POA: Diagnosis present

## 2023-09-03 DIAGNOSIS — H9193 Unspecified hearing loss, bilateral: Secondary | ICD-10-CM | POA: Diagnosis present

## 2023-09-03 DIAGNOSIS — Z85828 Personal history of other malignant neoplasm of skin: Secondary | ICD-10-CM

## 2023-09-03 LAB — LIPASE, BLOOD: Lipase: 37 U/L (ref 11–51)

## 2023-09-03 LAB — COMPREHENSIVE METABOLIC PANEL WITH GFR
ALT: 14 U/L (ref 0–44)
ALT: 14 U/L (ref 0–44)
AST: 22 U/L (ref 15–41)
AST: 28 U/L (ref 15–41)
Albumin: 4.7 g/dL (ref 3.5–5.0)
Albumin: 4.1 g/dL (ref 3.5–5.0)
Alkaline Phosphatase: 44 U/L (ref 38–126)
Alkaline Phosphatase: 38 U/L (ref 38–126)
Anion gap: 36 — ABNORMAL HIGH (ref 5–15)
Anion gap: 30 — ABNORMAL HIGH (ref 5–15)
BUN: 34 mg/dL — ABNORMAL HIGH (ref 8–23)
BUN: 37 mg/dL — ABNORMAL HIGH (ref 8–23)
CO2: 24 mmol/L (ref 22–32)
CO2: 31 mmol/L (ref 22–32)
Calcium: 10.7 mg/dL — ABNORMAL HIGH (ref 8.9–10.3)
Calcium: 10.1 mg/dL (ref 8.9–10.3)
Chloride: 78 mmol/L — ABNORMAL LOW (ref 98–111)
Chloride: 78 mmol/L — ABNORMAL LOW (ref 98–111)
Creatinine, Ser: 3.32 mg/dL — ABNORMAL HIGH (ref 0.61–1.24)
Creatinine, Ser: 3.43 mg/dL — ABNORMAL HIGH (ref 0.61–1.24)
GFR, Estimated: 18 mL/min — ABNORMAL LOW (ref >60)
GFR, Estimated: 18 mL/min — ABNORMAL LOW (ref >60)
Glucose, Bld: 179 mg/dL — ABNORMAL HIGH (ref 70–99)
Glucose, Bld: 125 mg/dL — ABNORMAL HIGH (ref 70–99)
Potassium: 3.4 mmol/L — ABNORMAL LOW (ref 3.5–5.1)
Potassium: 3.2 mmol/L — ABNORMAL LOW (ref 3.5–5.1)
Sodium: 138 mmol/L (ref 135–145)
Sodium: 139 mmol/L (ref 135–145)
Total Bilirubin: 2.5 mg/dL — ABNORMAL HIGH (ref 0.0–1.2)
Total Bilirubin: 2 mg/dL — ABNORMAL HIGH (ref 0.0–1.2)
Total Protein: 8.2 g/dL — ABNORMAL HIGH (ref 6.5–8.1)
Total Protein: 7.5 g/dL (ref 6.5–8.1)

## 2023-09-03 LAB — CBC WITH DIFFERENTIAL/PLATELET
Abs Immature Granulocytes: 0.06 K/uL (ref 0.00–0.07)
Basophils Absolute: 0 K/uL (ref 0.0–0.1)
Basophils Relative: 0 %
Eosinophils Absolute: 0 K/uL (ref 0.0–0.5)
Eosinophils Relative: 0 %
HCT: 48.6 % (ref 39.0–52.0)
Hemoglobin: 17.1 g/dL — ABNORMAL HIGH (ref 13.0–17.0)
Immature Granulocytes: 0 %
Lymphocytes Relative: 9 %
Lymphs Abs: 1.3 K/uL (ref 0.7–4.0)
MCH: 36.1 pg — ABNORMAL HIGH (ref 26.0–34.0)
MCHC: 35.2 g/dL (ref 30.0–36.0)
MCV: 102.5 fL — ABNORMAL HIGH (ref 80.0–100.0)
Monocytes Absolute: 1.2 K/uL — ABNORMAL HIGH (ref 0.1–1.0)
Monocytes Relative: 9 %
Neutro Abs: 11.4 K/uL — ABNORMAL HIGH (ref 1.7–7.7)
Neutrophils Relative %: 82 %
Platelets: 282 K/uL (ref 150–400)
RBC: 4.74 MIL/uL (ref 4.22–5.81)
RDW: 12.5 % (ref 11.5–15.5)
WBC: 13.9 K/uL — ABNORMAL HIGH (ref 4.0–10.5)
nRBC: 0 % (ref 0.0–0.2)

## 2023-09-03 LAB — URINALYSIS, ROUTINE W REFLEX MICROSCOPIC
Bilirubin Urine: NEGATIVE
Glucose, UA: NEGATIVE mg/dL
Hgb urine dipstick: NEGATIVE
Ketones, ur: 5 mg/dL — AB
Leukocytes,Ua: NEGATIVE
Nitrite: NEGATIVE
Protein, ur: 100 mg/dL — AB
Specific Gravity, Urine: 1.015 (ref 1.005–1.030)
pH: 5 (ref 5.0–8.0)

## 2023-09-03 LAB — MAGNESIUM: Magnesium: 1.8 mg/dL (ref 1.7–2.4)

## 2023-09-03 MED ORDER — FENOFIBRATE 160 MG PO TABS
160.0000 mg | ORAL_TABLET | Freq: Every day | ORAL | Status: DC
  Administered 2023-09-04 – 2023-09-07 (×4): 160 mg via ORAL
  Filled 2023-09-03 (×4): qty 1

## 2023-09-03 MED ORDER — THIAMINE MONONITRATE 100 MG PO TABS
100.0000 mg | ORAL_TABLET | Freq: Every day | ORAL | Status: DC
  Administered 2023-09-04 – 2023-09-07 (×4): 100 mg via ORAL
  Filled 2023-09-03 (×4): qty 1

## 2023-09-03 MED ORDER — ROSUVASTATIN CALCIUM 20 MG PO TABS
40.0000 mg | ORAL_TABLET | Freq: Every day | ORAL | Status: DC
  Administered 2023-09-04 – 2023-09-07 (×4): 40 mg via ORAL
  Filled 2023-09-03 (×4): qty 2

## 2023-09-03 MED ORDER — PANTOPRAZOLE SODIUM 40 MG PO TBEC
40.0000 mg | DELAYED_RELEASE_TABLET | Freq: Every day | ORAL | Status: DC
  Administered 2023-09-04: 40 mg via ORAL
  Filled 2023-09-03: qty 1

## 2023-09-03 MED ORDER — ACETAMINOPHEN 650 MG RE SUPP: 650.0000 mg | Freq: Four times a day (QID) | RECTAL | Status: DC | PRN

## 2023-09-03 MED ORDER — LACTATED RINGERS IV BOLUS
500.0000 mL | Freq: Once | INTRAVENOUS | Status: AC
  Administered 2023-09-03: 500 mL via INTRAVENOUS

## 2023-09-03 MED ORDER — PROPRANOLOL HCL ER 60 MG PO CP24
120.0000 mg | ORAL_CAPSULE | Freq: Every day | ORAL | Status: DC
  Administered 2023-09-04 – 2023-09-07 (×4): 120 mg via ORAL
  Filled 2023-09-03 (×4): qty 2

## 2023-09-03 MED ORDER — FOLIC ACID 1 MG PO TABS
1.0000 mg | ORAL_TABLET | Freq: Every day | ORAL | Status: DC
  Administered 2023-09-04 – 2023-09-07 (×4): 1 mg via ORAL
  Filled 2023-09-03 (×4): qty 1

## 2023-09-03 MED ORDER — SODIUM CHLORIDE 0.9% FLUSH
3.0000 mL | Freq: Two times a day (BID) | INTRAVENOUS | Status: DC
  Administered 2023-09-04 – 2023-09-06 (×5): 3 mL via INTRAVENOUS

## 2023-09-03 MED ORDER — POLYETHYLENE GLYCOL 3350 17 G PO PACK: 17.0000 g | PACK | Freq: Every day | ORAL | Status: DC | PRN

## 2023-09-03 MED ORDER — LACTATED RINGERS IV BOLUS
1000.0000 mL | Freq: Once | INTRAVENOUS | Status: AC
  Administered 2023-09-03: 1000 mL via INTRAVENOUS

## 2023-09-03 MED ORDER — LACTATED RINGERS IV SOLN: INTRAVENOUS | Status: AC

## 2023-09-03 MED ORDER — ADULT MULTIVITAMIN W/MINERALS CH
1.0000 | ORAL_TABLET | Freq: Every day | ORAL | Status: DC
  Administered 2023-09-04 – 2023-09-07 (×4): 1 via ORAL
  Filled 2023-09-03 (×4): qty 1

## 2023-09-03 MED ORDER — HEPARIN SODIUM (PORCINE) 5000 UNIT/ML IJ SOLN
5000.0000 [IU] | Freq: Three times a day (TID) | INTRAMUSCULAR | Status: DC
  Administered 2023-09-03 – 2023-09-04 (×2): 5000 [IU] via SUBCUTANEOUS
  Filled 2023-09-03 (×2): qty 1

## 2023-09-03 MED ORDER — POTASSIUM CHLORIDE 20 MEQ PO PACK
60.0000 meq | PACK | Freq: Two times a day (BID) | ORAL | Status: DC
  Administered 2023-09-03 – 2023-09-04 (×2): 60 meq via ORAL
  Filled 2023-09-03 (×2): qty 3

## 2023-09-03 MED ORDER — EZETIMIBE 10 MG PO TABS
10.0000 mg | ORAL_TABLET | Freq: Every day | ORAL | Status: DC
  Administered 2023-09-04 – 2023-09-07 (×4): 10 mg via ORAL
  Filled 2023-09-03 (×4): qty 1

## 2023-09-03 MED ORDER — ACETAMINOPHEN 325 MG PO TABS: 650.0000 mg | ORAL_TABLET | Freq: Four times a day (QID) | ORAL | Status: DC | PRN

## 2023-09-03 MED ORDER — METOCLOPRAMIDE HCL 5 MG/ML IJ SOLN
10.0000 mg | Freq: Once | INTRAMUSCULAR | Status: AC
  Administered 2023-09-03: 10 mg via INTRAVENOUS
  Filled 2023-09-03: qty 2

## 2023-09-03 NOTE — Progress Notes (Signed)
   09/03/23 2252  BiPAP/CPAP/SIPAP  BiPAP/CPAP/SIPAP Pt Type Adult  Reason BIPAP/CPAP not in use Other(comment) (patient drinking a drink for his test tomorrow so he said that he didnt want to wear it tonight)  BiPAP/CPAP /SiPAP Vitals  Resp 16  MEWS Score/Color  MEWS Score 0  MEWS Score Color Green

## 2023-09-03 NOTE — ED Triage Notes (Signed)
 Pt reports nausea and vomiting since yesterday. Pt has not been able to tolerate solid foods and has only been able to tolerate very little fluids.

## 2023-09-03 NOTE — ED Provider Notes (Signed)
 [place] EMERGENCY DEPARTMENT AT [facility] Provider Note   CSN: [id number] Arrival date & time: 09/03/23  1334     Patient presents with: Nausea and Emesis   Juan Torres is a 78 y.o. male.   Patient is a 78 year old male with a history of hypertension, hyperlipidemia, COPD, recent diagnosis of pancreatic mass about a month ago after having an episode of pancreatitis that is due for biopsy tomorrow who had a history of chronic alcohol use which is what caused his hospitalization a month ago who had been free of alcohol until 3 days ago.  Wife reports that yesterday he had multiple episodes of vomiting and diarrhea.  She gave him Zofran  and Imodium  and it seemed to help some but then he continued to have vomiting today.  He has had no further diarrhea.  She is concerned that he is dehydrated.  Patient denies any abdominal pain but has felt chilled.  Denies any urinary symptoms.  No history of diabetes.  The history is provided by the patient, the spouse and medical records.  Emesis      Prior to Admission medications   Medication Sig Start Date End Date Taking? Authorizing Provider  Cholecalciferol  (VITAMIN D3) 125 MCG (5000 UT) TABS Take 10,000 Units by mouth every morning.    [provider]  cyanocobalamin  (VITAMIN B12) 500 MCG tablet Take 1 tablet (500 mcg total) by mouth daily. 07/24/23   Arlon Carliss ORN, DO  ezetimibe  (ZETIA ) 10 MG tablet Take 1 tablet (10 mg total) by mouth daily for cholesterol. 08/08/22   Wilkinson, Dana E, NP  fenofibrate  micronized (LOFIBRA) 134 MG capsule Take 1 capsule (134 mg total) by mouth daily (blood fats). 06/24/22   Wilkinson, Dana E, NP  folic acid  (FOLVITE ) 1 MG tablet Take 1 tablet (1 mg total) by mouth daily. 07/24/23   Arlon Carliss ORN, DO  magnesium  oxide (MAG-OX) 400 (240 Mg) MG tablet Take 1 tablet (400 mg total) by mouth every evening. 06/22/23   Joshua Debby CROME, MD  Multiple Vitamin (MULTIVITAMIN WITH MINERALS) TABS tablet  Take 1 tablet by mouth daily. 07/24/23   Arlon Carliss ORN, DO  nicotine  (NICODERM CQ  - DOSED IN MG/24 HOURS) 14 mg/24hr patch Place 1 patch (14 mg total) onto the skin daily. 07/24/23   Arlon Carliss ORN, DO  Omega-3 Fatty Acids (FISH OIL) 1000 MG CAPS Take 1,000 mg by mouth every morning.    [provider]  omeprazole  (PRILOSEC) 40 MG capsule Take 1 capsule (40 mg total) by mouth daily to prevent indigestion & heartburn 04/17/23   Webb, Padonda B, FNP  propranolol  ER (INDERAL  LA) 120 MG 24 hr capsule Take 1 capsule (120 mg total) by mouth daily. For blood pressure and tremor 06/16/23   Joshua Debby CROME, MD  rosuvastatin  (CRESTOR ) 40 MG tablet Take 1 tablet (40 mg total) by mouth daily for cholesterol 07/14/23   Joshua Debby CROME, MD  sucralfate  (CARAFATE ) 1 GM/10ML suspension Take 10 mLs (1 g total) by mouth 3 (three) times daily with meals. 07/23/23   Arlon Carliss ORN, DO  thiamine  (VITAMIN B-1) 100 MG tablet Take 1 tablet (100 mg total) by mouth daily. 07/24/23   Arlon Carliss ORN, DO    Allergies: Lipitor [atorvastatin], Niacin and related, and Penicillins    Review of Systems  Gastrointestinal:  Positive for vomiting.    Updated Vital Signs BP (!) 143/72   Pulse 77   Temp 98.8 F (37.1 C) (Oral)  Resp 18   Ht 6' (1.829 m)   Wt 68 kg   SpO2 98%   BMI 20.34 kg/m   Physical Exam Vitals and nursing note reviewed.  Constitutional:      General: He is not in acute distress.    Appearance: He is well-developed.     Comments: Smells of ketones  HENT:     Head: Normocephalic and atraumatic.     Mouth/Throat:     Mouth: Mucous membranes are dry.  Eyes:     Conjunctiva/sclera: Conjunctivae normal.     Pupils: Pupils are equal, round, and reactive to light.  Cardiovascular:     Rate and Rhythm: Normal rate and regular rhythm.     Heart sounds: No murmur heard. Pulmonary:     Effort: Pulmonary effort is normal. No respiratory distress.     Breath sounds: Normal breath sounds. No  wheezing or rales.  Abdominal:     General: There is no distension.     Palpations: Abdomen is soft.     Tenderness: There is no abdominal tenderness. There is no guarding or rebound.  Musculoskeletal:        General: No tenderness. Normal range of motion.     Cervical back: Normal range of motion and neck supple.     Right lower leg: No edema.     Left lower leg: No edema.  Skin:    General: Skin is warm and dry.     Findings: No erythema or rash.  Neurological:     Mental Status: He is alert and oriented to person, place, and time. Mental status is at baseline.  Psychiatric:        Behavior: Behavior normal.     (all labs ordered are listed, but only abnormal results are displayed) Labs Reviewed  CBC WITH DIFFERENTIAL/PLATELET - Abnormal; Notable for the following components:      Result Value   WBC 13.9 (*)    Hemoglobin 17.1 (*)    MCV 102.5 (*)    MCH 36.1 (*)    Neutro Abs 11.4 (*)    Monocytes Absolute 1.2 (*)    All other components within normal limits  COMPREHENSIVE METABOLIC PANEL WITH GFR - Abnormal; Notable for the following components:   Potassium 3.4 (*)    Chloride 78 (*)    Glucose, Bld 179 (*)    BUN 34 (*)    Creatinine, Ser 3.32 (*)    Calcium  10.7 (*)    Total Protein 8.2 (*)    Total Bilirubin 2.5 (*)    GFR, Estimated 18 (*)    Anion gap 36 (*)    All other components within normal limits  LIPASE, BLOOD  URINALYSIS, ROUTINE W REFLEX MICROSCOPIC    EKG: None  Radiology: No results found.   Procedures   Medications Ordered in the ED  lactated ringers  infusion ( Intravenous New Bag/Given 09/03/23 1531)  lactated ringers  bolus 1,000 mL (0 mLs Intravenous Stopped 09/03/23 1723)  metoCLOPramide  (REGLAN ) injection 10 mg (10 mg Intravenous Given 09/03/23 1530)                                    Medical Decision Making Amount and/or Complexity of Data Reviewed External Data Reviewed: notes. Labs: ordered. Decision-making details  documented in ED Course.  Risk Prescription drug management. Decision regarding hospitalization.   Pt with multiple medical problems and comorbidities and presenting  today with a complaint that caries a high risk for morbidity and mortality.  Here today with the above symptoms.  Concern for recurrent pancreatitis, dehydration, AKI, lower suspicion for cardiac or pulmonary cause.  Patient has no abdominal pain and symptoms are unlikely for small bowel obstruction.  He denies any urinary symptoms and lower suspicion for UTI.  I independently interpreted patient's labs and CBC with a white count of 13.9 and hemoglobin of 17, CMP today with recurrent AKI with creatinine of 3.32 from 1.2, normal LFTs, hypochloremia and hypokalemia most likely from the vomiting and diarrhea and anion gap of 36.  Lipase normal.  Patient was given IV fluids and antiemetics.  On repeat of patient patient still having no abdominal pain.  Vital signs remained stable.  Will admit for the AKI and acidosis.  Hopefully patient will be able to get his biopsy tomorrow while in the hospital.  Discussed all these findings with the patient and his wife.  Will consult the hospitalist for admission.       Final diagnoses:  AKI (acute kidney injury) (HCC)  Nausea vomiting and diarrhea  High anion gap metabolic acidosis    ED Discharge Orders     None          Doretha Folks, MD 09/03/23 1725

## 2023-09-03 NOTE — H&P (Signed)
 History and Physical   Juan Torres FMW:[id number] DOB: [birth year] DOA: 09/03/2023  PCP: Joshua Debby CROME, MD   Patient coming from: Home  Chief Complaint: Nausea, vomiting, diarrhea  HPI: Juan Torres is a 78 y.o. male with medical history significant of hypertension, hyperlipidemia, GERD, CKD 3, QT prolongation, mild dementia, OSA on CPAP, COPD, prostate cancer, tremor presenting with nausea vomiting and diarrhea.  Patient was recently admitted for alcoholic pancreatitis and pancreatic mass was discovered at that time.  Has been seen by [facility] outpatient with plan for [place] [facility] provider to do pancreatic biopsy tomorrow.  No alcohol use for the past month until 3 days ago with anxiety increasing about mass.  Began to have significant nausea vomiting diarrhea yesterday and continued symptoms today.  No abdominal pain but does report some chills.  Denies fevers, chest pain, shortness of breath.  ED Course: Vital signs in the ED notable for blood pressure 147 150 systolic.  Lab workup included CMP with testing 3.4, chloride 78, BUN 34, creatinine 3.32 from baseline 1.3, normal bicarb at gap 36, glucose 179, calcium  10.7, protein 8.2, T. bili 2.5.  CBC with leukocytosis 13.9 and hemoglobin 17.1 likely hemoconcentration component.  Lipase normal.  Urinalysis pending.  No imaging.  Patient received Reglan  and 1 L IV fluids.  Started on a rate of fluids.  Review of Systems: As per HPI otherwise all other systems reviewed and are negative.  Past Medical History:  Diagnosis Date   Adenomatous colon polyp    Anxiety    Arthritis    FEET   COPD (chronic obstructive pulmonary disease) (HCC)    Depression    Familial tremor    GERD (gastroesophageal reflux disease)    Headache    When younger   History of basal cell cancer 2012   Hyperlipidemia    Hypertension    Prediabetes    Prostate cancer (HCC) DX 12/19/11   bx=Adenocarcinoma,gleason=3+3=6,volume=42cc,PSA=4.63   PTSD (post-traumatic  stress disorder)    Pulmonary nodules 12/10/2018   Numerous unchanged/benign appearing per CT lung 12/2018   Sleep apnea    no cpap   Vitamin D  deficiency     Past Surgical History:  Procedure Laterality Date   COLONOSCOPY W/ POLYPECTOMY     Adenomatous   CYSTOSCOPY N/A 04/04/2012   Procedure: CYSTOSCOPY;  Surgeon: Toribio Neysa Repine, MD;  Location: [facility];  Service: Urology;  Laterality: N/A;   EYE SURGERY     bilateral cataracts    INGUINAL HERNIA REPAIR Bilateral 10/18/2018   Procedure: LAPAROSCOPIC BILATERAL INGUINAL HERNIA REPAIR;  Surgeon: Gladis Cough, MD;  Location: [facility];  Service: General;  Laterality: Bilateral;   INGUINAL HERNIA REPAIR Right 04/25/2023   Procedure: OPEN RIGHT INGUINAL HERNIA REPAIR WITH MESH;  Surgeon: Signe Mitzie LABOR, MD;  Location: [facility];  Service: General;  Laterality: Right;   PROSTATE BIOPSY  12/19/2011   Procedure: BIOPSY TRANSRECTAL ULTRASONIC PROSTATE (TUBP);  Surgeon: Toribio Neysa Repine, MD;  Location[facility];  Service: Urology;  Laterality: N/A;     RADIOACTIVE SEED IMPLANT N/A 04/04/2012   Procedure: RADIOACTIVE SEED IMPLANT;  Surgeon: Toribio Neysa Repine, MD;  Location: [facility];  Service: Urology;  Laterality: N/A;  Seeds Implanted     72  Seeds Found in Bladder     None     Social History  reports that he has been smoking cigarettes and cigars. He started smoking about 34 years ago. He has a  34.7 pack-year smoking history. He has never used smokeless tobacco. He reports that he does not currently use alcohol after a past usage of about 1.0 standard drink of alcohol per week. He reports that he does not currently use drugs after having used the following drugs: Marijuana. Frequency: 7.00 times per week.  Allergies  Allergen Reactions   Lipitor [Atorvastatin] Other (See Comments)    fatigued   Niacin And Related Other (See Comments)    Flushing   Penicillins Rash     Family History  Problem Relation Age of Onset   Breast cancer Mother    Heart attack Father 40   Dementia Father    Prostate cancer Maternal Uncle        prostate   Ulcers Maternal Grandmother    Cancer Maternal Grandfather        colon   Prostate cancer Cousin        prostate cancer 1st maternal    Sleep apnea Neg Hx   Reviewed on admission  Prior to Admission medications   Medication Sig Start Date End Date Taking? Authorizing Provider  Cholecalciferol  (VITAMIN D3) 125 MCG (5000 UT) TABS Take 10,000 Units by mouth every morning.    [provider]  cyanocobalamin  (VITAMIN B12) 500 MCG tablet Take 1 tablet (500 mcg total) by mouth daily. 07/24/23   Arlon Carliss ORN, DO  ezetimibe  (ZETIA ) 10 MG tablet Take 1 tablet (10 mg total) by mouth daily for cholesterol. 08/08/22   Wilkinson, Dana E, NP  fenofibrate  micronized (LOFIBRA) 134 MG capsule Take 1 capsule (134 mg total) by mouth daily (blood fats). 06/24/22   Wilkinson, Dana E, NP  folic acid  (FOLVITE ) 1 MG tablet Take 1 tablet (1 mg total) by mouth daily. 07/24/23   Arlon Carliss ORN, DO  magnesium  oxide (MAG-OX) 400 (240 Mg) MG tablet Take 1 tablet (400 mg total) by mouth every evening. 06/22/23   Joshua Debby CROME, MD  Multiple Vitamin (MULTIVITAMIN WITH MINERALS) TABS tablet Take 1 tablet by mouth daily. 07/24/23   Arlon Carliss ORN, DO  nicotine  (NICODERM CQ  - DOSED IN MG/24 HOURS) 14 mg/24hr patch Place 1 patch (14 mg total) onto the skin daily. 07/24/23   Arlon Carliss ORN, DO  Omega-3 Fatty Acids (FISH OIL) 1000 MG CAPS Take 1,000 mg by mouth every morning.    [provider]  omeprazole  (PRILOSEC) 40 MG capsule Take 1 capsule (40 mg total) by mouth daily to prevent indigestion & heartburn 04/17/23   Webb, Padonda B, FNP  propranolol  ER (INDERAL  LA) 120 MG 24 hr capsule Take 1 capsule (120 mg total) by mouth daily. For blood pressure and tremor 06/16/23   Joshua Debby CROME, MD  rosuvastatin  (CRESTOR ) 40 MG tablet Take 1  tablet (40 mg total) by mouth daily for cholesterol 07/14/23   Joshua Debby CROME, MD  sucralfate  (CARAFATE ) 1 GM/10ML suspension Take 10 mLs (1 g total) by mouth 3 (three) times daily with meals. 07/23/23   Arlon Carliss ORN, DO  thiamine  (VITAMIN B-1) 100 MG tablet Take 1 tablet (100 mg total) by mouth daily. 07/24/23   Arlon Carliss ORN, DO    Physical Exam: Vitals:   09/03/23 1342 09/03/23 1645  BP: (!) 157/80 (!) 143/72  Pulse: 72 77  Resp: 18 18  Temp: 97.7 F (36.5 C) 98.8 F (37.1 C)  TempSrc: Oral Oral  SpO2: 100% 98%  Weight: 68 kg   Height: 6' (1.829 m)     Physical Exam Constitutional:  General: He is not in acute distress.    Appearance: Normal appearance.  HENT:     Head: Normocephalic and atraumatic.     Mouth/Throat:     Mouth: Mucous membranes are moist.     Pharynx: Oropharynx is clear.  Eyes:     Extraocular Movements: Extraocular movements intact.     Pupils: Pupils are equal, round, and reactive to light.  Cardiovascular:     Rate and Rhythm: Normal rate and regular rhythm.     Pulses: Normal pulses.     Heart sounds: Normal heart sounds.  Pulmonary:     Effort: Pulmonary effort is normal. No respiratory distress.     Breath sounds: Normal breath sounds.  Abdominal:     General: Bowel sounds are normal. There is no distension.     Palpations: Abdomen is soft.     Tenderness: There is no abdominal tenderness.  Musculoskeletal:        General: No swelling or deformity.  Skin:    General: Skin is warm and dry.  Neurological:     General: No focal deficit present.     Mental Status: Mental status is at baseline.    Labs on Admission: I have personally reviewed following labs and imaging studies  CBC: Recent Labs  Lab 09/03/23 1406  WBC 13.9*  NEUTROABS 11.4*  HGB 17.1*  HCT 48.6  MCV 102.5*  PLT 282    Basic Metabolic Panel: Recent Labs  Lab 09/03/23 1406  NA 138  K 3.4*  CL 78*  CO2 24  GLUCOSE 179*  BUN 34*  CREATININE 3.32*   CALCIUM  10.7*    GFR: Estimated Creatinine Clearance: 17.9 mL/min (A) (by C-G formula based on SCr of 3.32 mg/dL (H)).  Liver Function Tests: Recent Labs  Lab 09/03/23 1406  AST 22  ALT 14  ALKPHOS 44  BILITOT 2.5*  PROT 8.2*  ALBUMIN 4.7    Urine analysis:    Component Value Date/Time   COLORURINE AMBER (A) 07/19/2023 1450   APPEARANCEUR CLOUDY (A) 07/19/2023 1450   LABSPEC 1.019 07/19/2023 1450   PHURINE 5.0 07/19/2023 1450   GLUCOSEU 50 (A) 07/19/2023 1450   HGBUR NEGATIVE 07/19/2023 1450   BILIRUBINUR MODERATE (A) 07/19/2023 1450   KETONESUR NEGATIVE 07/19/2023 1450   PROTEINUR 30 (A) 07/19/2023 1450   NITRITE NEGATIVE 07/19/2023 1450   LEUKOCYTESUR NEGATIVE 07/19/2023 1450    Radiological Exams on Admission: No results found.  EKG: Independently reviewed.  Sinus rhythm at 65 bpm.  Nonspecific T wave changes.  Low voltage in aVL.  Baseline artifact in 2, 3, aVR, aVL, aVF.  Assessment/Plan Active Problems:   GERD (gastroesophageal reflux disease)   Hyperlipidemia, mixed   Essential hypertension   CKD (chronic kidney disease) stage 3, GFR 30-59 ml/min (HCC)   Emphysema lung (HCC)   OSA on CPAP   Mild dementia associated with alcoholism, with anxiety (HCC)   QT prolongation   AKI on CKD 3 Nausea, vomiting, diarrhea Elevated anion gap > Creatinine elevated to 3.32 in the setting of recent nausea vomiting diarrhea from baseline 1.3. > Anion gap elevated to 36 with normal bicarb.  Possibly compensated changes / mixed acidosis/alkalosis.  Some degree of chloride loss from GI symptoms contributing to gap anion. > Received 1 L IV fluids and started on a rate of fluids. - Monitor on telemetry overnight - Continue with IV fluids - Check lactic acid and follow-up urinalysis for ketones given elevated anion gap, repeat BMP this evening  to trend. - Supportive care  Pancreatic mass > Recent pancreatitis, believed to be alcohol induced.  Pancreatic mass noted at  that time. > Scheduled for biopsy tomorrow. - Will reach out to GI here as this is where biopsy was to be done, hopefully will be able to get this done while admitted  Hypertension - Not currently on antihypertensives been on propranolol  which she also takes for tremor  Hyperlipidemia - Continue Zetia , fenofibrate , rosuvastatin   GERD - Continue PPI  Mild dementia - Noted  OSA - Continue home CPAP  COPD - Not on any inhalers  History of prostate cancer - Noted  Tremor - Continue propranolol   Alcohol use > As above, recent pancreatitis.  No alcohol for the past month until few days ago. - Continue thiamine , folate, multivitamin   DVT prophylaxis: Heparin  Code Status:   Full Family Communication:  Updated at bedside  Disposition Plan:   Patient is from:  Home  Anticipated DC to:  Home  Anticipated DC date:  1 to 3 days  Anticipated DC barriers: None  Consults called:  Will message GI about getting procedure done while admitted Admission status:  Observation, telemetry  Severity of Illness: The appropriate patient status for this patient is OBSERVATION. Observation status is judged to be reasonable and necessary in order to provide the required intensity of service to ensure the patient's safety. The patient's presenting symptoms, physical exam findings, and initial radiographic and laboratory data in the context of their medical condition is felt to place them at decreased risk for further clinical deterioration. Furthermore, it is anticipated that the patient will be medically stable for discharge from the hospital within 2 midnights of admission.    Juan KATHEE Scurry MD [facility]  How to contact the [facility] Attending or Consulting provider 7A - 7P or covering provider during after hours 7P -7A, for this patient?   Check the care team in [facility] and look for a) attending/consulting [facility] provider listed and b) the [facility] team listed Log into [email or web address] and use [facility]'s universal password to access. If you do not have the password, please contact the hospital operator. Locate the [facility] provider you are looking for under [facility] and page to a number that you can be directly reached. If you still have difficulty reaching the provider, please page the [facility] (Director on Call) for the Hospitalists listed on amion for assistance.  09/03/2023, 6:00 PM

## 2023-09-04 ENCOUNTER — Observation Stay (HOSPITAL_COMMUNITY): Admitting: Anesthesiology

## 2023-09-04 ENCOUNTER — Encounter (HOSPITAL_COMMUNITY)
Admission: EM | Disposition: A | Discharge status: Home / Self Care | Payer: Self-pay | Source: Home / Self Care | Attending: Internal Medicine

## 2023-09-04 ENCOUNTER — Encounter (HOSPITAL_COMMUNITY): Payer: Self-pay | Admitting: Internal Medicine

## 2023-09-04 ENCOUNTER — Ambulatory Visit (HOSPITAL_COMMUNITY): Admission: RE | Admit: 2023-09-04 | Source: Home / Self Care | Admitting: Gastroenterology

## 2023-09-04 DIAGNOSIS — R627 Adult failure to thrive: Secondary | ICD-10-CM | POA: Diagnosis present

## 2023-09-04 DIAGNOSIS — E8729 Other acidosis: Secondary | ICD-10-CM | POA: Diagnosis not present

## 2023-09-04 DIAGNOSIS — I10 Essential (primary) hypertension: Secondary | ICD-10-CM | POA: Diagnosis not present

## 2023-09-04 DIAGNOSIS — R197 Diarrhea, unspecified: Secondary | ICD-10-CM

## 2023-09-04 DIAGNOSIS — C7A8 Other malignant neuroendocrine tumors: Secondary | ICD-10-CM | POA: Diagnosis present

## 2023-09-04 DIAGNOSIS — K2981 Duodenitis with bleeding: Secondary | ICD-10-CM | POA: Diagnosis present

## 2023-09-04 DIAGNOSIS — R066 Hiccough: Secondary | ICD-10-CM | POA: Diagnosis not present

## 2023-09-04 DIAGNOSIS — Z8249 Family history of ischemic heart disease and other diseases of the circulatory system: Secondary | ICD-10-CM | POA: Diagnosis not present

## 2023-09-04 DIAGNOSIS — K264 Chronic or unspecified duodenal ulcer with hemorrhage: Secondary | ICD-10-CM | POA: Diagnosis present

## 2023-09-04 DIAGNOSIS — E782 Mixed hyperlipidemia: Secondary | ICD-10-CM | POA: Diagnosis present

## 2023-09-04 DIAGNOSIS — I472 Ventricular tachycardia, unspecified: Secondary | ICD-10-CM | POA: Diagnosis not present

## 2023-09-04 DIAGNOSIS — K2081 Other esophagitis with bleeding: Secondary | ICD-10-CM | POA: Diagnosis not present

## 2023-09-04 DIAGNOSIS — E669 Obesity, unspecified: Secondary | ICD-10-CM | POA: Diagnosis present

## 2023-09-04 DIAGNOSIS — F32A Depression, unspecified: Secondary | ICD-10-CM | POA: Diagnosis present

## 2023-09-04 DIAGNOSIS — R935 Abnormal findings on diagnostic imaging of other abdominal regions, including retroperitoneum: Secondary | ICD-10-CM

## 2023-09-04 DIAGNOSIS — N1831 Chronic kidney disease, stage 3a: Secondary | ICD-10-CM

## 2023-09-04 DIAGNOSIS — R1310 Dysphagia, unspecified: Secondary | ICD-10-CM | POA: Diagnosis not present

## 2023-09-04 DIAGNOSIS — K209 Esophagitis, unspecified without bleeding: Secondary | ICD-10-CM

## 2023-09-04 DIAGNOSIS — E878 Other disorders of electrolyte and fluid balance, not elsewhere classified: Secondary | ICD-10-CM

## 2023-09-04 DIAGNOSIS — K2950 Unspecified chronic gastritis without bleeding: Secondary | ICD-10-CM

## 2023-09-04 DIAGNOSIS — E874 Mixed disorder of acid-base balance: Secondary | ICD-10-CM | POA: Diagnosis present

## 2023-09-04 DIAGNOSIS — J90 Pleural effusion, not elsewhere classified: Secondary | ICD-10-CM | POA: Diagnosis present

## 2023-09-04 DIAGNOSIS — D3A8 Other benign neuroendocrine tumors: Secondary | ICD-10-CM

## 2023-09-04 DIAGNOSIS — K269 Duodenal ulcer, unspecified as acute or chronic, without hemorrhage or perforation: Secondary | ICD-10-CM | POA: Diagnosis not present

## 2023-09-04 DIAGNOSIS — J449 Chronic obstructive pulmonary disease, unspecified: Secondary | ICD-10-CM | POA: Diagnosis not present

## 2023-09-04 DIAGNOSIS — K2211 Ulcer of esophagus with bleeding: Secondary | ICD-10-CM | POA: Diagnosis present

## 2023-09-04 DIAGNOSIS — E876 Hypokalemia: Secondary | ICD-10-CM | POA: Diagnosis present

## 2023-09-04 DIAGNOSIS — R112 Nausea with vomiting, unspecified: Secondary | ICD-10-CM | POA: Diagnosis not present

## 2023-09-04 DIAGNOSIS — I129 Hypertensive chronic kidney disease with stage 1 through stage 4 chronic kidney disease, or unspecified chronic kidney disease: Secondary | ICD-10-CM

## 2023-09-04 DIAGNOSIS — K219 Gastro-esophageal reflux disease without esophagitis: Secondary | ICD-10-CM | POA: Diagnosis not present

## 2023-09-04 DIAGNOSIS — R1114 Bilious vomiting: Secondary | ICD-10-CM

## 2023-09-04 DIAGNOSIS — G4733 Obstructive sleep apnea (adult) (pediatric): Secondary | ICD-10-CM | POA: Diagnosis present

## 2023-09-04 DIAGNOSIS — N1832 Chronic kidney disease, stage 3b: Secondary | ICD-10-CM | POA: Diagnosis present

## 2023-09-04 DIAGNOSIS — F17290 Nicotine dependence, other tobacco product, uncomplicated: Secondary | ICD-10-CM | POA: Diagnosis present

## 2023-09-04 DIAGNOSIS — K8689 Other specified diseases of pancreas: Secondary | ICD-10-CM | POA: Diagnosis not present

## 2023-09-04 DIAGNOSIS — K2971 Gastritis, unspecified, with bleeding: Secondary | ICD-10-CM | POA: Diagnosis present

## 2023-09-04 DIAGNOSIS — F17210 Nicotine dependence, cigarettes, uncomplicated: Secondary | ICD-10-CM | POA: Diagnosis present

## 2023-09-04 DIAGNOSIS — R634 Abnormal weight loss: Secondary | ICD-10-CM

## 2023-09-04 DIAGNOSIS — F10282 Alcohol dependence with alcohol-induced sleep disorder: Secondary | ICD-10-CM | POA: Diagnosis present

## 2023-09-04 DIAGNOSIS — F1027 Alcohol dependence with alcohol-induced persisting dementia: Secondary | ICD-10-CM | POA: Diagnosis present

## 2023-09-04 DIAGNOSIS — N179 Acute kidney failure, unspecified: Secondary | ICD-10-CM | POA: Diagnosis present

## 2023-09-04 DIAGNOSIS — K869 Disease of pancreas, unspecified: Secondary | ICD-10-CM

## 2023-09-04 DIAGNOSIS — J439 Emphysema, unspecified: Secondary | ICD-10-CM | POA: Diagnosis present

## 2023-09-04 HISTORY — PX: EUS: SHX5427

## 2023-09-04 HISTORY — PX: FINE NEEDLE ASPIRATION: SHX6590

## 2023-09-04 HISTORY — PX: ESOPHAGOGASTRODUODENOSCOPY: SHX5428

## 2023-09-04 LAB — COMPREHENSIVE METABOLIC PANEL WITH GFR
ALT: 13 U/L (ref 0–44)
AST: 22 U/L (ref 15–41)
Albumin: 3.5 g/dL (ref 3.5–5.0)
Alkaline Phosphatase: 32 U/L — ABNORMAL LOW (ref 38–126)
Anion gap: 20 — ABNORMAL HIGH (ref 5–15)
BUN: 39 mg/dL — ABNORMAL HIGH (ref 8–23)
CO2: 33 mmol/L — ABNORMAL HIGH (ref 22–32)
Calcium: 9.2 mg/dL (ref 8.9–10.3)
Chloride: 83 mmol/L — ABNORMAL LOW (ref 98–111)
Creatinine, Ser: 3.03 mg/dL — ABNORMAL HIGH (ref 0.61–1.24)
GFR, Estimated: 20 mL/min — ABNORMAL LOW (ref >60)
Glucose, Bld: 117 mg/dL — ABNORMAL HIGH (ref 70–99)
Potassium: 3 mmol/L — ABNORMAL LOW (ref 3.5–5.1)
Sodium: 136 mmol/L (ref 135–145)
Total Bilirubin: 1.2 mg/dL (ref 0.0–1.2)
Total Protein: 6.1 g/dL — ABNORMAL LOW (ref 6.5–8.1)

## 2023-09-04 LAB — BASIC METABOLIC PANEL WITH GFR
Anion gap: 14 (ref 5–15)
BUN: 37 mg/dL — ABNORMAL HIGH (ref 8–23)
CO2: 35 mmol/L — ABNORMAL HIGH (ref 22–32)
Calcium: 9.4 mg/dL (ref 8.9–10.3)
Chloride: 91 mmol/L — ABNORMAL LOW (ref 98–111)
Creatinine, Ser: 2.88 mg/dL — ABNORMAL HIGH (ref 0.61–1.24)
GFR, Estimated: 22 mL/min — ABNORMAL LOW (ref >60)
Glucose, Bld: 115 mg/dL — ABNORMAL HIGH (ref 70–99)
Potassium: 3.5 mmol/L (ref 3.5–5.1)
Sodium: 140 mmol/L (ref 135–145)

## 2023-09-04 LAB — CBC
HCT: 40.4 % (ref 39.0–52.0)
Hemoglobin: 13.9 g/dL (ref 13.0–17.0)
MCH: 35.8 pg — ABNORMAL HIGH (ref 26.0–34.0)
MCHC: 34.4 g/dL (ref 30.0–36.0)
MCV: 104.1 fL — ABNORMAL HIGH (ref 80.0–100.0)
Platelets: 206 K/uL (ref 150–400)
RBC: 3.88 MIL/uL — ABNORMAL LOW (ref 4.22–5.81)
RDW: 12.7 % (ref 11.5–15.5)
WBC: 12.1 K/uL — ABNORMAL HIGH (ref 4.0–10.5)
nRBC: 0 % (ref 0.0–0.2)

## 2023-09-04 SURGERY — ULTRASOUND, UPPER GI TRACT, ENDOSCOPIC
Anesthesia: Monitor Anesthesia Care

## 2023-09-04 MED ORDER — SUCRALFATE 1 GM/10ML PO SUSP
1.0000 g | Freq: Three times a day (TID) | ORAL | Status: DC
  Administered 2023-09-04 – 2023-09-07 (×11): 1 g via ORAL
  Filled 2023-09-04 (×11): qty 10

## 2023-09-04 MED ORDER — PHENYLEPHRINE 80 MCG/ML (10ML) SYRINGE FOR IV PUSH (FOR BLOOD PRESSURE SUPPORT)
PREFILLED_SYRINGE | INTRAVENOUS | Status: DC | PRN
  Administered 2023-09-04: 80 ug via INTRAVENOUS

## 2023-09-04 MED ORDER — PANTOPRAZOLE SODIUM 40 MG PO TBEC
40.0000 mg | DELAYED_RELEASE_TABLET | Freq: Two times a day (BID) | ORAL | Status: DC
  Administered 2023-09-04 – 2023-09-07 (×6): 40 mg via ORAL
  Filled 2023-09-04 (×6): qty 1

## 2023-09-04 MED ORDER — POTASSIUM CHLORIDE 10 MEQ/100ML IV SOLN
10.0000 meq | INTRAVENOUS | Status: AC
  Administered 2023-09-04 (×2): 10 meq via INTRAVENOUS
  Filled 2023-09-04 (×2): qty 100

## 2023-09-04 MED ORDER — PROPOFOL 10 MG/ML IV BOLUS
INTRAVENOUS | Status: DC | PRN
  Administered 2023-09-04: 20 mg via INTRAVENOUS
  Administered 2023-09-04: 40 mg via INTRAVENOUS

## 2023-09-04 MED ORDER — PROPOFOL 1000 MG/100ML IV EMUL
INTRAVENOUS | Status: AC
  Filled 2023-09-04: qty 100

## 2023-09-04 MED ORDER — PROPOFOL 500 MG/50ML IV EMUL
INTRAVENOUS | Status: DC | PRN
  Administered 2023-09-04: 170 ug/kg/min via INTRAVENOUS

## 2023-09-04 MED ORDER — SODIUM CHLORIDE 0.9 % IV SOLN: INTRAVENOUS | Status: AC

## 2023-09-04 NOTE — Progress Notes (Signed)
  Progress Note   Patient: Juan Torres FMW:[id number] DOB: 07/15/1945 DOA: 09/03/2023     0 DOS: the patient was seen and examined on 09/04/2023   Brief hospital course: 78 y.o. male with medical history significant of hypertension, hyperlipidemia, GERD, CKD 3, QT prolongation, mild dementia, OSA on CPAP, COPD, prostate cancer, tremor presenting with nausea vomiting and diarrhea.   Patient was recently admitted for alcoholic pancreatitis and pancreatic mass was discovered at that time.  Has been seen by [facility] outpatient with plan for [place] [facility] provider to do pancreatic biopsy tomorrow.   No alcohol use for the past month until 3 days ago with anxiety increasing about mass.  Began to have significant nausea vomiting diarrhea yesterday and continued symptoms today.  No abdominal pain but does report some chills.   Denies fevers, chest pain, shortness of breath.  Assessment and Plan: AKI on CKD 3 Nausea, vomiting, diarrhea Elevated anion gap > Creatinine elevated to 3.32 in the setting of recent nausea vomiting diarrhea from baseline 1.3. > Anion gap elevated to 36 with normal bicarb.  Possibly compensated changes / mixed acidosis/alkalosis.  Some degree of chloride loss from GI symptoms contributing to gap anion. > continued with IVF hydration with some improvement in renal function -Recheck bmet in AM   Pancreatic mass > Recent pancreatitis, believed to be alcohol induced.  Pancreatic mass noted at that time. 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 Biopsy pending   Hypertension - Not currently on antihypertensives been on propranolol  which he also takes for tremor   Hyperlipidemia - Continue Zetia , fenofibrate , rosuvastatin    GERD - Continue PPI   Mild dementia - Noted   OSA - Continue home CPAP   COPD -  Not on any inhalers   History of prostate cancer - Noted   Tremor - Continue propranolol    Alcohol use > As above, recent pancreatitis.  No alcohol for the past month until few days ago. - Continue thiamine , folate, multivitamin          Subjective: Seen prior to EUS. Without complaints and denied abd pain  Physical Exam: Vitals:   09/04/23 1440 09/04/23 1450 09/04/23 1500 09/04/23 1526  BP: (!) 170/48 (!) 148/58 (!) 164/58 (!) 152/74  Pulse: (!) 51 (!) 57 (!) 59 (!) 58  Resp: 16 15 12 17   Temp:    98.2 F (36.8 C)  TempSrc:    Oral  SpO2: 99% 96% 93% 95%  Weight:      Height:       General exam: Awake, laying in bed, in nad Respiratory system: Normal respiratory effort, no wheezing Cardiovascular system: regular rate, s1, s2 Gastrointestinal system: Soft, nondistended, positive BS Central nervous system: CN2-12 grossly intact, strength intact Extremities: Perfused, no clubbing Skin: Normal skin turgor, no notable skin lesions seen Psychiatry: Mood normal // no visual hallucinations   Data Reviewed:  Labs reviewed: Na 140, K 3.5, Cr 2.88  Family Communication: Pt in room, family at bedside  Disposition: Status is: Observation The patient will require care spanning > 2 midnights and should be moved to inpatient because: severity of illness  Planned Discharge Destination: Home     Author: Garnette Pelt, MD 09/04/2023 4:38 PM  For on call review [email or web address].

## 2023-09-04 NOTE — Transfer of Care (Signed)
 Immediate Anesthesia Transfer of Care Note  Patient: Juan Torres  Procedure(s) Performed: ULTRASOUND, UPPER GI TRACT, ENDOSCOPIC EGD (ESOPHAGOGASTRODUODENOSCOPY)  Patient Location: PACU  Anesthesia Type:MAC  Level of Consciousness: drowsy  Airway & Oxygen Therapy: Patient Spontanous Breathing and Patient connected to face mask oxygen  Post-op Assessment: Report given to RN, Post -op Vital signs reviewed and stable, and Patient moving all extremities X 4  Post vital signs: Reviewed and stable  Last Vitals:  Vitals Value Taken Time  BP 122/42 09/04/23 14:05  Temp    Pulse 51 09/04/23 14:06  Resp 14 09/04/23 14:06  SpO2 99 % 09/04/23 14:06  Vitals shown include unfiled device data.  Last Pain:  Vitals:   09/04/23 1208  TempSrc: Temporal  PainSc: 0-No pain      Patients Stated Pain Goal: 0 (09/04/23 1049)  Complications: No notable events documented.

## 2023-09-04 NOTE — H&P (Signed)
 GASTROENTEROLOGY PROCEDURE H&P NOTE   Primary Care Physician: Joshua Debby CROME, MD  HPI: Juan Torres is a 78 y.o. male who presents for EGD/EUS to evaluate for pancreatic mass and dysphagia and esophagitis.  Past Medical History:  Diagnosis Date   Adenomatous colon polyp    Anxiety    Arthritis    FEET   COPD (chronic obstructive pulmonary disease) (HCC)    Depression    Familial tremor    GERD (gastroesophageal reflux disease)    Headache    When younger   History of basal cell cancer 2012   Hyperlipidemia    Hypertension    Prediabetes    Prostate cancer (HCC) DX 12/19/11   bx=Adenocarcinoma,gleason=3+3=6,volume=42cc,PSA=4.63   PTSD (post-traumatic stress disorder)    Pulmonary nodules 12/10/2018   Numerous unchanged/benign appearing per CT lung 12/2018   Sleep apnea    no cpap   Vitamin D  deficiency    Past Surgical History:  Procedure Laterality Date   COLONOSCOPY W/ POLYPECTOMY     Adenomatous   CYSTOSCOPY N/A 04/04/2012   Procedure: CYSTOSCOPY;  Surgeon: Toribio Neysa Repine, MD;  Location: [facility];  Service: Urology;  Laterality: N/A;   EYE SURGERY     bilateral cataracts    INGUINAL HERNIA REPAIR Bilateral 10/18/2018   Procedure: LAPAROSCOPIC BILATERAL INGUINAL HERNIA REPAIR;  Surgeon: Gladis Cough, MD;  Location: [facility];  Service: General;  Laterality: Bilateral;   INGUINAL HERNIA REPAIR Right 04/25/2023   Procedure: OPEN RIGHT INGUINAL HERNIA REPAIR WITH MESH;  Surgeon: Signe Mitzie LABOR, MD;  Location: [facility];  Service: General;  Laterality: Right;   PROSTATE BIOPSY  12/19/2011   Procedure: BIOPSY TRANSRECTAL ULTRASONIC PROSTATE (TUBP);  Surgeon: Toribio Neysa Repine, MD;  Location: [facility];  Service: Urology;  Laterality: N/A;     RADIOACTIVE SEED IMPLANT N/A 04/04/2012   Procedure: RADIOACTIVE SEED IMPLANT;  Surgeon: Toribio Neysa Repine, MD;  Location: [facility];  Service: Urology;  Laterality:  N/A;  Seeds Implanted     72  Seeds Found in Bladder     None    Current Facility-Administered Medications  Medication Dose Route Frequency Provider Last Rate Last Admin   [MAR Hold] acetaminophen  (TYLENOL ) tablet 650 mg  650 mg Oral Q6H PRN Melvin, Alexander B, MD       Or   ILDA Hold] acetaminophen  (TYLENOL ) suppository 650 mg  650 mg Rectal Q6H PRN Seena Marsa NOVAK, MD       [MAR Hold] ezetimibe  (ZETIA ) tablet 10 mg  10 mg Oral Daily Melvin, Alexander B, MD   10 mg at 09/04/23 0823   [MAR Hold] fenofibrate  tablet 160 mg  160 mg Oral Daily Melvin, Alexander B, MD   160 mg at 09/04/23 9178   [facility] Hold] folic acid  (FOLVITE ) tablet 1 mg  1 mg Oral Daily Melvin, Alexander B, MD   1 mg at 09/04/23 9177   lactated ringers  infusion   Intravenous Continuous Melvin, Alexander B, MD 150 mL/hr at 09/04/23 1040 New Bag at 09/04/23 1040   [MAR Hold] multivitamin with minerals tablet 1 tablet  1 tablet Oral Daily Melvin, Alexander B, MD   1 tablet at 09/04/23 0821   [MAR Hold] pantoprazole  (PROTONIX ) EC tablet 40 mg  40 mg Oral BID Collier, Amanda R, PA-C       [MAR Hold] polyethylene glycol (MIRALAX  / GLYCOLAX ) packet 17 g  17 g Oral Daily PRN Seena Marsa NOVAK, MD       [  MAR Hold] propranolol  ER (INDERAL  LA) 24 hr capsule 120 mg  120 mg Oral Daily Melvin, Alexander B, MD   120 mg at 09/04/23 0821   [MAR Hold] rosuvastatin  (CRESTOR ) tablet 40 mg  40 mg Oral Daily Melvin, Alexander B, MD   40 mg at 09/04/23 9177   [facility] Hold] sodium chloride  flush (NS) 0.9 % injection 3 mL  3 mL Intravenous Q12H Seena Marsa NOVAK, MD   3 mL at 09/04/23 0823   [MAR Hold] thiamine  (VITAMIN B1) tablet 100 mg  100 mg Oral Daily Melvin, Alexander B, MD   100 mg at 09/04/23 9177    Current Facility-Administered Medications:    [MAR Hold] acetaminophen  (TYLENOL ) tablet 650 mg, 650 mg, Oral, Q6H PRN **OR** [MAR Hold] acetaminophen  (TYLENOL ) suppository 650 mg, 650 mg, Rectal, Q6H PRN, Melvin, Alexander B, MD   [MAR Hold]  ezetimibe  (ZETIA ) tablet 10 mg, 10 mg, Oral, Daily, Melvin, Alexander B, MD, 10 mg at 09/04/23 9176   [facility] Hold] fenofibrate  tablet 160 mg, 160 mg, Oral, Daily, Melvin, Alexander B, MD, 160 mg at 09/04/23 9178   [facility] Hold] folic acid  (FOLVITE ) tablet 1 mg, 1 mg, Oral, Daily, Melvin, Alexander B, MD, 1 mg at 09/04/23 9177   lactated ringers  infusion, , Intravenous, Continuous, Melvin, Alexander B, MD, Last Rate: 150 mL/hr at 09/04/23 1040, New Bag at 09/04/23 1040   [MAR Hold] multivitamin with minerals tablet 1 tablet, 1 tablet, Oral, Daily, Melvin, Alexander B, MD, 1 tablet at 09/04/23 9178   [facility] Hold] pantoprazole  (PROTONIX ) EC tablet 40 mg, 40 mg, Oral, BID, Collier, Amanda R, PA-C   [MAR Hold] polyethylene glycol (MIRALAX  / GLYCOLAX ) packet 17 g, 17 g, Oral, Daily PRN, Melvin, Alexander B, MD   [facility] Hold] propranolol  ER (INDERAL  LA) 24 hr capsule 120 mg, 120 mg, Oral, Daily, Melvin, Alexander B, MD, 120 mg at 09/04/23 9178   [facility] Hold] rosuvastatin  (CRESTOR ) tablet 40 mg, 40 mg, Oral, Daily, Melvin, Alexander B, MD, 40 mg at 09/04/23 9177   [facility] Hold] sodium chloride  flush (NS) 0.9 % injection 3 mL, 3 mL, Intravenous, Q12H, Melvin, Alexander B, MD, 3 mL at 09/04/23 0823   [MAR Hold] thiamine  (VITAMIN B1) tablet 100 mg, 100 mg, Oral, Daily, Melvin, Alexander B, MD, 100 mg at 09/04/23 9177 Allergies  Allergen Reactions   Lipitor [Atorvastatin] Other (See Comments)    fatigued   Niacin And Related Other (See Comments)    Flushing   Penicillins Rash   Family History  Problem Relation Age of Onset   Breast cancer Mother    Heart attack Father 58   Dementia Father    Prostate cancer Maternal Uncle        prostate   Ulcers Maternal Grandmother    Cancer Maternal Grandfather        colon   Prostate cancer Cousin        prostate cancer 1st maternal    Sleep apnea Neg Hx    Social History   Socioeconomic History   Marital status: Married    Spouse name: Not on file   Number of  children: 2   Years of education: Not on file   Highest education level: Not on file  Occupational History   Not on file  Tobacco Use   Smoking status: Every Day    Current packs/day: 1.00    Average packs/day: 1 pack/day for 34.7 years (34.7 ttl pk-yrs)    Types: Cigarettes, Cigars    Start  date: 12/10/1988   Smokeless tobacco: Never   Tobacco comments:    Smokes Black and milds and using a Nicotine   Vaping Use   Vaping status: Never Used  Substance and Sexual Activity   Alcohol use: Yes    Alcohol/week: 1.0 standard drink of alcohol    Types: 1 Shots of liquor per week    Comment: occ   Drug use: Not Currently    Frequency: 7.0 times per week    Types: Marijuana    Comment: / marijuana-last smoked 10/15/2018   Sexual activity: Not Currently  Other Topics Concern   Not on file  Social History Narrative   Lives at home with spouse   Right handed   Caffeine: none   Social Drivers of Corporate investment banker Strain: Low Risk  (08/07/2023)   Overall Financial Resource Strain (CARDIA)    Difficulty of Paying Living Expenses: Not hard at all  Food Insecurity: No Food Insecurity (09/03/2023)   Hunger Vital Sign    Worried About Running Out of Food in the Last Year: Never true    Ran Out of Food in the Last Year: Never true  Transportation Needs: No Transportation Needs (09/03/2023)   PRAPARE - Administrator, Civil Service (Medical): No    Lack of Transportation (Non-Medical): No  Physical Activity: Insufficiently Active (08/07/2023)   Exercise Vital Sign    Days of Exercise per Week: 2 days    Minutes of Exercise per Session: 60 min  Stress: No Stress Concern Present (08/07/2023)   [employer] of Occupational Health - Occupational Stress Questionnaire    Feeling of Stress: Only a little  Social Connections: Socially Integrated (09/03/2023)   Social Connection and Isolation Panel    Frequency of Communication with Friends and Family: More than three times  a week    Frequency of Social Gatherings with Friends and Family: More than three times a week    Attends Religious Services: More than 4 times per year    Active Member of Clubs or Organizations: Yes    Attends Banker Meetings: More than 4 times per year    Marital Status: Married  Catering manager Violence: Not At Risk (09/03/2023)   Humiliation, Afraid, Rape, and Kick questionnaire    Fear of Current or Ex-Partner: No    Emotionally Abused: No    Physically Abused: No    Sexually Abused: No    Physical Exam: Today's Vitals   09/04/23 0445 09/04/23 0754 09/04/23 1049 09/04/23 1208  BP: 117/64 125/67  (!) 160/79  Pulse: (!) 57 (!) 56  64  Resp: 16 16  15   Temp: 98.5 F (36.9 C) 97.8 F (36.6 C)  97.8 F (36.6 C)  TempSrc: Oral Oral  Temporal  SpO2: 96% 95%  94%  Weight:      Height:      PainSc:   0-No pain 0-No pain   Body mass index is 20.33 kg/m. GEN: NAD EYE: Sclerae anicteric ENT: MMM CV: Non-tachycardic GI: Soft, NT/ND NEURO:  Alert & Oriented x 3  Lab Results: Recent Labs    09/03/23 1406 09/04/23 0339  WBC 13.9* 12.1*  HGB 17.1* 13.9  HCT 48.6 40.4  PLT 282 206   BMET Recent Labs    09/03/23 1406 09/03/23 1949 09/04/23 0339  NA 138 139 136  K 3.4* 3.2* 3.0*  CL 78* 78* 83*  CO2 24 31 33*  GLUCOSE 179* 125* 117*  BUN 34*  37* 39*  CREATININE 3.32* 3.43* 3.03*  CALCIUM  10.7* 10.1 9.2   LFT Recent Labs    09/04/23 0339  PROT 6.1*  ALBUMIN 3.5  AST 22  ALT 13  ALKPHOS 32*  BILITOT 1.2   PT/INR No results for input(s): LABPROT, INR in the last 72 hours.   Impression / Plan: This is a 78 y.o.male who presents for EGD/EUS to evaluate for pancreatic mass and dysphagia and esophagitis.  The risks of an EUS including intestinal perforation, bleeding, infection, aspiration, and medication effects were discussed as was the possibility it may not give a definitive diagnosis if a biopsy is performed.  When a biopsy of the  pancreas is done as part of the EUS, there is an additional risk of pancreatitis at the rate of about 1-2%.  It was explained that procedure related pancreatitis is typically mild, although it can be severe and even life threatening, which is why we do not perform random pancreatic biopsies and only biopsy a lesion/area we feel is concerning enough to warrant the risk.  The risks and benefits of endoscopic evaluation/treatment were discussed with the patient and/or family; these include but are not limited to the risk of perforation, infection, bleeding, missed lesions, lack of diagnosis, severe illness requiring hospitalization, as well as anesthesia and sedation related illnesses.  The patient's history has been reviewed, patient examined, no change in status, and deemed stable for procedure.  The patient and/or family is agreeable to proceed.    Aloha Finner, MD [facility] Office # [phone]

## 2023-09-04 NOTE — Plan of Care (Incomplete)
  Problem: Education: Goal: Knowledge of General Education information will improve Description: Including pain rating scale, medication(s)/side effects and non-pharmacologic comfort measures Outcome: Progressing   Problem: Health Behavior/Discharge Planning: Goal: Ability to manage health-related needs will improve Outcome: Progressing   Problem: Clinical Measurements: Goal: Ability to maintain clinical measurements within normal limits will improve Outcome: Progressing Goal: Will remain free from infection Outcome: Progressing Goal: Diagnostic test results will improve Outcome: Progressing   Problem: Activity: Goal: Risk for activity intolerance will decrease Outcome: Progressing   Problem: Nutrition: Goal: Adequate nutrition will be maintained Outcome: Progressing   Problem: Safety: Goal: Ability to remain free from injury will improve Outcome: Progressing   Problem: Clinical Measurements: Goal: Respiratory complications will improve Outcome: Adequate for Discharge Goal: Cardiovascular complication will be avoided Outcome: Adequate for Discharge   Problem: Coping: Goal: Level of anxiety will decrease Outcome: Adequate for Discharge   Problem: Elimination: Goal: Will not experience complications related to bowel motility Outcome: Adequate for Discharge Goal: Will not experience complications related to urinary retention Outcome: Adequate for Discharge   Problem: Pain Managment: Goal: General experience of comfort will improve and/or be controlled Outcome: Adequate for Discharge   Problem: Skin Integrity: Goal: Risk for impaired skin integrity will decrease Outcome: Adequate for Discharge

## 2023-09-04 NOTE — Progress Notes (Signed)
   09/04/23 2232  BiPAP/CPAP/SIPAP  BiPAP/CPAP/SIPAP Pt Type Adult  Reason BIPAP/CPAP not in use Non-compliant (Pt refusing cpap for the night.)

## 2023-09-04 NOTE — Hospital Course (Signed)
 78 y.o. male with medical history significant of hypertension, hyperlipidemia, GERD, CKD 3, QT prolongation, mild dementia, OSA on CPAP, COPD, prostate cancer, tremor presenting with nausea vomiting and diarrhea.   Patient was recently admitted for alcoholic pancreatitis and pancreatic mass was discovered at that time.  Has been seen by [facility] outpatient with plan for [facility] provider to do pancreatic biopsy tomorrow.   No alcohol use for the past month until 3 days ago with anxiety increasing about mass.  Began to have significant nausea vomiting diarrhea yesterday and continued symptoms today.  No abdominal pain but does report some chills.   Denies fevers, chest pain, shortness of breath.

## 2023-09-04 NOTE — Consult Note (Signed)
 Consultation  Referring Provider:   [facility] Primary Care Physician:  Joshua Debby CROME, MD Primary Gastroenterologist:  Dr. 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 male with past medical history significant for hypertension, hyperlipidemia, GERD, CKD stage III, QT prolongation, dementia, OSA on CPAP, COPD, prostate cancer, history of alcoholic pancreatitis and pancreatic mass.   Presents to the ER 09/03/2023 with nausea, vomiting, diarrhea. Patient previously referred by Dr. Leonor Dawn with [facility] to Dr. 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 Work up notable in the ER for CBC with leukocytosis 13.9 hemoglobin 17.1 possible hemoconcentration component.  Normal lipase.  Potassium 3.4, BUN 34 creatinine 3.32 baseline 1.3, calcium  10.7 protein 8.2 total bilirubin 2.5, AST 22, ALT 13. Patient has been receiving LR bolus and infusions with oral potassium and 10 mEq ordered.  Brief summary of visit with Dr. Leonor Dawn 07/26/2023 for diagnosed mass head of pancreas.  Patient had weight loss diarrhea.  MRI 6/4 showed 3.2 cm mass head of pancreas with enhancement consistent with neuroendocrine tumor.  Patient is been having alcohol withdrawal.  Diagnosed with esophagitis during hospitalization.  No family or was in the room at time of evaluation. Patient is difficult historian and potentially had some confusion during conversations as his story about drinking change.  Stated that he started to redrink vodka 4-5 shots a day least starting 2 to 3 weeks ago stating last drink was possibly a week ago though I question this.  After he started drinking again he started having nausea, vomiting and diarrhea.  Diarrhea was watery denies melena or hematochezia.  Up to 3-4 times daily.  Complains of  weakness chills but denies fever.  Denies shortness of breath chest pain.  No abdominal discomfort.   States last bowel movement was prior to him coming to the ER.  Complaining of hiccups.  And when trying to drink liquid potassium had severe esophageal pain and vomiting. Otherwise denies GERD, dysphagia or odynophagia. Denies jaundice, dark urine, abdominal swelling. No family history of pancreatic cancer  Abnormal ED labs: Abnormal Labs Reviewed  CBC WITH DIFFERENTIAL/PLATELET - Abnormal; Notable for the following components:      Result Value   WBC 13.9 (*)    Hemoglobin 17.1 (*)    MCV 102.5 (*)    MCH 36.1 (*)    Neutro Abs 11.4 (*)    Monocytes Absolute 1.2 (*)    All other components within normal limits  COMPREHENSIVE METABOLIC PANEL WITH GFR - Abnormal; Notable for the following components:   Potassium 3.4 (*)    Chloride 78 (*)    Glucose, Bld 179 (*)    BUN 34 (*)    Creatinine, Ser 3.32 (*)    Calcium  10.7 (*)    Total Protein 8.2 (*)    Total Bilirubin 2.5 (*)    GFR, Estimated 18 (*)    Anion gap 36 (*)    All other components within normal limits  URINALYSIS, ROUTINE W REFLEX MICROSCOPIC - Abnormal; Notable for the following components:   Color, Urine AMBER (*)    APPearance CLOUDY (*)    Ketones, ur 5 (*)    Protein, ur 100 (*)  Bacteria, UA RARE (*)    All other components within normal limits  COMPREHENSIVE METABOLIC PANEL WITH GFR - Abnormal; Notable for the following components:   Potassium 3.2 (*)    Chloride 78 (*)    Glucose, Bld 125 (*)    BUN 37 (*)    Creatinine, Ser 3.43 (*)    Total Bilirubin 2.0 (*)    GFR, Estimated 18 (*)    Anion gap 30 (*)    All other components within normal limits  COMPREHENSIVE METABOLIC PANEL WITH GFR - Abnormal; Notable for the following components:   Potassium 3.0 (*)    Chloride 83 (*)    CO2 33 (*)    Glucose, Bld 117 (*)    BUN 39 (*)    Creatinine, Ser 3.03 (*)    Total Protein 6.1 (*)    Alkaline  Phosphatase 32 (*)    GFR, Estimated 20 (*)    Anion gap 20 (*)    All other components within normal limits  CBC - Abnormal; Notable for the following components:   WBC 12.1 (*)    RBC 3.88 (*)    MCV 104.1 (*)    MCH 35.8 (*)    All other components within normal limits    Past Medical History:  Diagnosis Date   Adenomatous colon polyp    Anxiety    Arthritis    FEET   COPD (chronic obstructive pulmonary disease) (HCC)    Depression    Familial tremor    GERD (gastroesophageal reflux disease)    Headache    When younger   History of basal cell cancer 2012   Hyperlipidemia    Hypertension    Prediabetes    Prostate cancer (HCC) DX 12/19/11   bx=Adenocarcinoma,gleason=3+3=6,volume=42cc,PSA=4.63   PTSD (post-traumatic stress disorder)    Pulmonary nodules 12/10/2018   Numerous unchanged/benign appearing per CT lung 12/2018   Sleep apnea    no cpap   Vitamin D  deficiency     Surgical History:  He  has a past surgical history that includes Prostate biopsy (12/19/2011); Colonoscopy w/ polypectomy; Radioactive seed implant (N/A, 04/04/2012); Cystoscopy (N/A, 04/04/2012); Eye surgery; Inguinal hernia repair (Bilateral, 10/18/2018); and Inguinal hernia repair (Right, 04/25/2023). Family History:  His family history includes Breast cancer in his mother; Cancer in his maternal grandfather; Dementia in his father; Heart attack (age of onset: 78) in his father; Prostate cancer in his cousin and maternal uncle; Ulcers in his maternal grandmother. Social History:   reports that he has been smoking cigarettes and cigars. He started smoking about 34 years ago. He has a 34.7 pack-year smoking history. He has never used smokeless tobacco. He reports current alcohol use of about 1.0 standard drink of alcohol per week. He reports that he does not currently use drugs after having used the following drugs: Marijuana. Frequency: 7.00 times per week.  Prior to Admission medications   Medication  Sig Start Date End Date Taking? Authorizing Provider  Cholecalciferol  (VITAMIN D3) 125 MCG (5000 UT) TABS Take 10,000 Units by mouth every morning.   Yes [provider]  cyanocobalamin  (VITAMIN B12) 500 MCG tablet Take 1 tablet (500 mcg total) by mouth daily. 07/24/23  Yes Arlon Carliss ORN, DO  ezetimibe  (ZETIA ) 10 MG tablet Take 1 tablet (10 mg total) by mouth daily for cholesterol. 08/08/22  Yes Wilkinson, Dana E, NP  fenofibrate  micronized (LOFIBRA) 134 MG capsule Take 1 capsule (134 mg total) by mouth daily (blood fats). 06/24/22  Yes Wilkinson, Dana E, NP  folic acid  (FOLVITE ) 1 MG tablet Take 1 tablet (1 mg total) by mouth daily. 07/24/23  Yes Arlon Carliss ORN, DO  magnesium  oxide (MAG-OX) 400 (240 Mg) MG tablet Take 1 tablet (400 mg total) by mouth every evening. 06/22/23  Yes Joshua Debby CROME, MD  Multiple Vitamin (MULTIVITAMIN WITH MINERALS) TABS tablet Take 1 tablet by mouth daily. 07/24/23  Yes Arlon Carliss ORN, DO  nicotine  (NICODERM CQ  - DOSED IN MG/24 HOURS) 14 mg/24hr patch Place 1 patch (14 mg total) onto the skin daily. 07/24/23  Yes Arlon Carliss ORN, DO  Omega-3 Fatty Acids (FISH OIL) 1000 MG CAPS Take 1,000 mg by mouth every morning.   Yes [provider]  omeprazole  (PRILOSEC) 40 MG capsule Take 1 capsule (40 mg total) by mouth daily to prevent indigestion & heartburn 04/17/23  Yes Douglass Caul B, FNP  propranolol  ER (INDERAL  LA) 120 MG 24 hr capsule Take 1 capsule (120 mg total) by mouth daily. For blood pressure and tremor 06/16/23  Yes Joshua Debby CROME, MD  rosuvastatin  (CRESTOR ) 40 MG tablet Take 1 tablet (40 mg total) by mouth daily for cholesterol 07/14/23  Yes Joshua Debby CROME, MD  thiamine  (VITAMIN B-1) 100 MG tablet Take 1 tablet (100 mg total) by mouth daily. 07/24/23  Yes Arlon Carliss ORN, DO  sucralfate  (CARAFATE ) 1 GM/10ML suspension Take 10 mLs (1 g total) by mouth 3 (three) times daily with meals. 07/23/23   Arlon Carliss ORN, DO    Current Facility-Administered  Medications  Medication Dose Route Frequency Provider Last Rate Last Admin   acetaminophen  (TYLENOL ) tablet 650 mg  650 mg Oral Q6H PRN Seena Marsa NOVAK, MD       Or   acetaminophen  (TYLENOL ) suppository 650 mg  650 mg Rectal Q6H PRN Seena Marsa NOVAK, MD       ezetimibe  (ZETIA ) tablet 10 mg  10 mg Oral Daily Melvin, Alexander B, MD       fenofibrate  tablet 160 mg  160 mg Oral Daily Melvin, Alexander B, MD       folic acid  (FOLVITE ) tablet 1 mg  1 mg Oral Daily Seena Marsa NOVAK, MD       lactated ringers  infusion   Intravenous Continuous Melvin, Alexander B, MD 150 mL/hr at 09/04/23 0456 New Bag at 09/04/23 0456   multivitamin with minerals tablet 1 tablet  1 tablet Oral Daily Melvin, Alexander B, MD       pantoprazole  (PROTONIX ) EC tablet 40 mg  40 mg Oral Daily Melvin, Alexander B, MD       polyethylene glycol (MIRALAX  / GLYCOLAX ) packet 17 g  17 g Oral Daily PRN Seena Marsa NOVAK, MD       potassium chloride  (KLOR-CON ) packet 60 mEq  60 mEq Oral BID Melvin, Alexander B, MD   60 mEq at 09/03/23 2223   potassium chloride  10 mEq in 100 mL IVPB  10 mEq Intravenous Q1 Hr x 2 Mansouraty, Gabriel Jr., MD 100 mL/hr at 09/04/23 0737 10 mEq at 09/04/23 0737   propranolol  ER (INDERAL  LA) 24 hr capsule 120 mg  120 mg Oral Daily Melvin, Alexander B, MD       rosuvastatin  (CRESTOR ) tablet 40 mg  40 mg Oral Daily Melvin, Alexander B, MD       sodium chloride  flush (NS) 0.9 % injection 3 mL  3 mL Intravenous Q12H Seena Marsa NOVAK, MD       thiamine  (VITAMIN B1) tablet 100  mg  100 mg Oral Daily Seena Marsa NOVAK, MD        Allergies as of 09/03/2023 - Review Complete 09/03/2023  Allergen Reaction Noted   Lipitor [atorvastatin] Other (See Comments) 01/27/2013   Niacin and related Other (See Comments) 01/27/2013   Penicillins Rash 11/18/2011    Review of Systems:    Constitutional: No weight loss, fever, chills, weakness or fatigue HEENT: Eyes: No change in vision               Ears,  Nose, Throat:  No change in hearing or congestion Skin: No rash or itching Cardiovascular: No chest pain, chest pressure or palpitations   Respiratory: No SOB or cough Gastrointestinal: See HPI and otherwise negative Genitourinary: No dysuria or change in urinary frequency Neurological: No headache, dizziness or syncope Musculoskeletal: No new muscle or joint pain Hematologic: No bleeding or bruising Psychiatric: No history of depression or anxiety     Physical Exam:  Vital signs in last 24 hours: Temp:  [97.7 F (36.5 C)-98.8 F (37.1 C)] 97.8 F (36.6 C) (07/28 0754) Pulse Rate:  [56-77] 56 (07/28 0754) Resp:  [16-18] 16 (07/28 0754) BP: (117-157)/(58-80) 125/67 (07/28 0754) SpO2:  [95 %-100 %] 95 % (07/28 0754) Weight:  [68 kg] 68 kg (07/27 1931) Last BM Date : 09/02/23 Last BM recorded by nurses in past 5 days No data recorded  General:   Chronically ill-appearing male in no acute distress Head:  Normocephalic and atraumatic. Eyes: sclerae anicteric,conjunctive pink  Heart:  regular rate and rhythm, no murmurs or gallops Pulm: Clear anteriorly; no wheezing Abdomen:  Soft, obese AB, Active bowel sounds. No tenderness . Without guarding and Without rebound, No organomegaly appreciated. Extremities:  Without edema. Msk:  Symmetrical without gross deformities. Peripheral pulses intact.  Neurologic:  Alert and  oriented x4;  No focal deficits.  Skin:   Bilateral ecchymoses on the arms Psychiatric:  Cooperative. Normal mood and affect.  Poor historian  LAB RESULTS: Recent Labs    09/03/23 1406 09/04/23 0339  WBC 13.9* 12.1*  HGB 17.1* 13.9  HCT 48.6 40.4  PLT 282 206   BMET Recent Labs    09/03/23 1406 09/03/23 1949 09/04/23 0339  NA 138 139 136  K 3.4* 3.2* 3.0*  CL 78* 78* 83*  CO2 24 31 33*  GLUCOSE 179* 125* 117*  BUN 34* 37* 39*  CREATININE 3.32* 3.43* 3.03*  CALCIUM  10.7* 10.1 9.2   LFT Recent Labs    09/04/23 0339  PROT 6.1*  ALBUMIN 3.5  AST  22  ALT 13  ALKPHOS 32*  BILITOT 1.2   PT/INR No results for input(s): LABPROT, INR in the last 72 hours.  STUDIES: No results found.  07/12/2023 MR abdomen with and without contrast IMPRESSION: 1. Rounded, homogeneous contrast enhancing mass arising from the dorsal pancreatic uncinate measuring 3.2 x 2.5 cm. 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 2. No evidence of lymphadenopathy or metastatic disease in the abdomen. 3. Mild hepatic steatosis.   Impression/Plan   3.2 cm mass head of pancreas with enhancement consistent with neuroendocrine tumor Patient was planned for this morning however received dose of VTE prophylaxis currently on schedule for 1350 -N.p.o. -Will replace potassium recheck stat prior to procedure -Avoid BC prophylaxis at least 4 hours prior -EUS today with Dr. Wilhelmenia -The risks of an EUS including intestinal perforation, bleeding, infection, aspiration, and medication effects were discussed as was the possibility it may not give a definitive diagnosis  if a biopsy is performed.  When a biopsy of the pancreas is done as part of the EUS, there is an additional risk of pancreatitis at the rate of about 1-2%.  It was explained that procedure related pancreatitis is typically mild, although it can be severe and even life threatening, which is why we do not perform random pancreatic biopsies and only biopsy a lesion/area we feel is concerning enough to warrant the risk.  AKI with electrolyte imbalance  secondary to nausea vomiting diarrhea BUN 39 Cr 3.03  GFR 20  Potassium 3.0  Magnesium  1.8  Patient unable to take oral potassium due to severe esophageal pain likely from erosive esophagitis from vomiting/alcohol - Need potassium release above 3.5 for EUS - Had received 10 mill equivalent in the evening, ordering IV 20 mill equivalents now - Will put in stat BMET prior to EUS scheduled today  Alcohol use with history of  withdrawal Uncertain last drink, history of withdrawals no evidence today -Empiric treatment with folic acid , multivitamin and thiamine .  -Alcohol Abstinence counseling discussed with patient -Monitor for withdrawal symptoms while inpatient  Diarrhea, weight loss 12/02/2011 colonoscopy Dr. Debrah 3 benign polyps measuring 2 mm, diverticulosis, recall 10 years Negative C. difficile/GI pathogen May 2025 Likely secondary to neuroendocrine tumor versus alcohol use versus other Has improved since being inpatient  GERD with esophagitis Likely secondary to vomiting/alcohol use Continue pantoprazole  40 mg will increase to twice daily Consider Carafate   Other comorbid conditions listed below Active Problems:   GERD (gastroesophageal reflux disease)   Hyperlipidemia, mixed   Essential hypertension   CKD (chronic kidney disease) stage 3, GFR 30-59 ml/min (HCC)   Emphysema lung (HCC)   OSA on CPAP   Mild dementia associated with alcoholism, with anxiety (HCC)   QT prolongation   Acute renal failure superimposed on stage 3a chronic kidney disease (HCC)    LOS: 0 days     Alan JONELLE Coombs  09/04/2023, 8:07 AM

## 2023-09-04 NOTE — Anesthesia Preprocedure Evaluation (Addendum)
 Anesthesia Evaluation  Patient identified by MRN, date of birth, ID band Patient awake    Reviewed: Allergy & Precautions, NPO status , Patient's Chart, lab work & pertinent test results, reviewed documented beta blocker date and time   Airway Mallampati: II  TM Distance: >3 FB Neck ROM: Full    Dental no notable dental hx. (+) Teeth Intact, Dental Advisory Given   Pulmonary sleep apnea , COPD, Current Smoker and Patient abstained from smoking.   Pulmonary exam normal breath sounds clear to auscultation       Cardiovascular hypertension, Pt. on home beta blockers and Pt. on medications Normal cardiovascular exam Rhythm:Regular Rate:Normal  TTE 2025 1. Left ventricular ejection fraction, by estimation, is 60 to 65%. The  left ventricle has normal function. The left ventricle has no regional  wall motion abnormalities. There is mild left ventricular hypertrophy.  Left ventricular diastolic parameters  were normal.   2. Right ventricular systolic function is normal. The right ventricular  size is normal.   3. The mitral valve is grossly normal. Trivial mitral valve  regurgitation. No evidence of mitral stenosis.   4. The aortic valve is tricuspid. There is mild calcification of the  aortic valve. Aortic valve regurgitation is not visualized. Aortic valve  sclerosis is present, with no evidence of aortic valve stenosis.   5. The inferior vena cava is normal in size with greater than 50%  respiratory variability, suggesting right atrial pressure of 3 mmHg.     Neuro/Psych  Headaches PSYCHIATRIC DISORDERS Anxiety Depression   Dementia    GI/Hepatic ,GERD  ,,(+)     substance abuse (alcoholic pancreatitis)  alcohol use  Endo/Other  negative endocrine ROS    Renal/GU Renal InsufficiencyRenal disease  negative genitourinary   Musculoskeletal  (+) Arthritis ,    Abdominal   Peds  Hematology negative hematology ROS (+)    Anesthesia Other Findings   Reproductive/Obstetrics                              Anesthesia Physical Anesthesia Plan  ASA: 3  Anesthesia Plan: MAC   Post-op Pain Management:    Induction: Intravenous  PONV Risk Score and Plan: Propofol  infusion and Treatment may vary due to age or medical condition  Airway Management Planned: Natural Airway  Additional Equipment:   Intra-op Plan:   Post-operative Plan:   Informed Consent: I have reviewed the patients History and Physical, chart, labs and discussed the procedure including the risks, benefits and alternatives for the proposed anesthesia with the patient or authorized representative who has indicated his/her understanding and acceptance.     Dental advisory given  Plan Discussed with: CRNA  Anesthesia Plan Comments:          Anesthesia Quick Evaluation

## 2023-09-04 NOTE — Op Note (Signed)
 [facility] Patient Name: Juan Torres Procedure Date: 09/04/2023 MRN: [id number] Attending MD: Aloha Finner , MD, [id number] Date of Birth: 10-Jul-1945 CSN: [id number] Age: 78 Admit Type: Outpatient Procedure:                Upper EUS Indications:              Suspected mass in pancreas on MRCP Providers:                Aloha Finner, MD, Olam Riedel, RN, Corene Southgate,                            Technician Referring MD:              Medicines:                Monitored Anesthesia Care Complications:            No immediate complications. Estimated Blood Loss:     Estimated blood loss was minimal. Procedure:                Pre-Anesthesia Assessment:                           - Prior to the procedure, a History and Physical                            was performed, and patient medications and                            allergies were reviewed. The patient's tolerance of                            previous anesthesia was also reviewed. The risks                            and benefits of the procedure and the sedation                            options and risks were discussed with the patient.                            All questions were answered, and informed consent                            was obtained. Prior Anticoagulants: The patient has                            taken heparin , last dose was day of procedure. ASA                            Grade Assessment: III - A patient with severe                            systemic disease. After reviewing the risks and  benefits, the patient was deemed in satisfactory                            condition to undergo the procedure.                           After obtaining informed consent, the endoscope was                            passed under direct vision. Throughout the                            procedure, the patient's blood pressure, pulse, and                            oxygen  saturations were monitored continuously. The                            [id number]-[id number] ([id number]) Olympus endoscope was introduced                            through the mouth, and advanced to the second part                            of duodenum. The [id number]-[id number] ([id number]) Olympus                            duodenoscope was introduced through the mouth, and                            advanced to the area of papilla. The [id number]                            ([id number]) Olympus linear ultrasound scope was                            introduced through the mouth, and advanced to the                            duodenum for ultrasound examination from the                            stomach and duodenum. The upper EUS was                            accomplished without difficulty. The patient                            tolerated the procedure. Scope In: Scope Out: Findings:      ENDOSCOPIC FINDING: :      No gross lesions were noted in the proximal esophagus.      LA Grade D (one or more mucosal breaks involving at least 75% of       esophageal circumference) esophagitis with bleeding was found in  the       middle/distal esophagus (18 cm to 40 cm). Biopsies were taken with a       cold forceps for histology.      A 3 cm hiatal hernia was present.      Patchy moderate inflammation characterized by erosions, erythema and       granularity was found in the entire examined stomach. Biopsies were       taken with a cold forceps for histology and Helicobacter pylori testing.      Segmental severe inflammation with hemorrhage characterized by       congestion (edema), erosions, erythema, friability, granularity and       shallow ulcerations was found in the duodenal bulb, in the first portion       of the duodenum, in the second portion of the duodenum and in the area       of the papilla. Biopsies were taken with a cold forceps for histology       and Helicobacter pylori testing.      Moderately congested  mucosa without active bleeding and with no stigmata       of bleeding was found at the major papilla (difficult to find and was       hidden under a hood).      ENDOSONOGRAPHIC FINDING: :      A rounded mass was identified in the pancreatic head/uncinate process.       The mass was hyperechoic and had 1 central calcification. The mass       measured 36 mm by 27 mm in maximal cross-sectional diameter. The outer       margins were smooth. The remainder of the pancreas was examined. The       endosonographic appearance of parenchyma and the upstream pancreatic       duct indicated a tortuous/ectatic duct but no significant ductal       dilation and parenchymal atrophy with hyperechoic stranding and       lobularity as well. Fine needle biopsy was performed of the mass. Color       Doppler imaging was utilized prior to needle puncture to confirm a lack       of significant vascular structures within the needle path. Six passes       were made with the 22 gauge Acquire biopsy needle using a transduodenal       approach. Visible cores of tissue were obtained. Preliminary cytologic       examination and touch preps were performed. Final cytology results are       pending.      There was no sign of significant endosonographic abnormality in the       common bile duct and in the common hepatic duct. Ducts with regular       contour were identified.      Moderate hyperechoic material consistent with sludge was visualized       endosonographically in the gallbladder.      Endosonographic imaging in the visualized portion of the liver showed no       mass.      No malignant-appearing lymph nodes were visualized in the celiac region       (level 20), peripancreatic region and porta hepatis region.      The celiac region was visualized. Impression:               EGD Impression:                           -  No gross lesions in the proximal esophagus. LA                            Grade D erosive  esophagitis with bleeding found in                            the middle and distal esophagus -biopsied.                           - 3 cm hiatal hernia.                           - Gastritis. Biopsied.                           - Duodenitis and duodenal ulcers with hemorrhage                            noted in the proximal duodenum. Biopsied.                           - Congested major papilla.                           EUS impression:                           - A mass was identified in the pancreatic head.                            Cytology results are pending. However, the                            endosonographic appearance is suspicious for a                            neuroendocrine tumor. This was staged T2 N0 Mx by                            endosonographic criteria. The staging applies if                            malignancy is confirmed. Fine needle biopsy                            performed.                           - There was no sign of significant pathology in the                            common bile duct and in the common hepatic duct.                           - Hyperechoic material consistent with sludge was  visualized endosonographically in the gallbladder.                           - No malignant-appearing lymph nodes were                            visualized in the celiac region (level 20),                            peripancreatic region and porta hepatis region. Moderate Sedation:      Not Applicable - Patient had care per Anesthesia. Recommendation:           - The patient will be observed post-procedure,                            until all discharge criteria are met.                           - Discharge patient to home.                           - Patient has a contact number available for                            emergencies. The signs and symptoms of potential                            delayed complications were discussed  with the                            patient. Return to normal activities tomorrow.                            Written discharge instructions were provided to the                            patient.                           - Advance diet as tolerated.                           - Observe patient's clinical course.                           - Continue PPI twice daily.                           - Carafate  1 g 4 times daily for 1 month then twice                            daily, pending patient not having issues from a                            kidney dysfunction standpoint longer-term.                           -  Expect patient may develop issues of dysphagia in                            future from healing of this area of the esophagus.                           - Follow-up pathology and cytology.                           - Monitor for signs or symptoms of pancreatitis,                            infection, perforation, bleeding.                           - May restart VTE prophylaxis in 24 hours, if                            DOAC/NOAC were to be required, would wait 48 hours.                           - The findings and recommendations were discussed                            with the patient.                           - The findings and recommendations were discussed                            with the patient's family. Procedure Code(s):        --- Professional ---                           [phone], Esophagogastroduodenoscopy, flexible,                            transoral; with transendoscopic ultrasound-guided                            intramural or transmural fine needle                            aspiration/biopsy(s), (includes endoscopic                            ultrasound examination limited to the esophagus,                            stomach or duodenum, and adjacent structures)                           43239, 59, Esophagogastroduodenoscopy, flexible,                             transoral; with biopsy, single or  multiple Diagnosis Code(s):        --- Professional ---                           K20.81, Other esophagitis with bleeding                           K44.9, Diaphragmatic hernia without obstruction or                            gangrene                           K29.70, Gastritis, unspecified, without bleeding                           K29.81, Duodenitis with bleeding                           K31.89, Other diseases of stomach and duodenum                           K86.89, Other specified diseases of pancreas                           I89.9, Noninfective disorder of lymphatic vessels                            and lymph nodes, unspecified                           K83.8, Other specified diseases of biliary tract                           R93.3, Abnormal findings on diagnostic imaging of                            other parts of digestive tract CPT copyright 2022 [employer]. All rights reserved. The codes documented in this report are preliminary and upon coder review may  be revised to meet current compliance requirements. Aloha Finner, MD 09/04/2023 2:49:35 PM Number of Addenda: 0

## 2023-09-04 NOTE — Anesthesia Procedure Notes (Signed)
 Procedure Name: MAC Date/Time: 09/04/2023 1:07 PM  Performed by: Brandy Almarie BROCKS, CRNAPre-anesthesia Checklist: Patient identified and Emergency Drugs available Oxygen Delivery Method: Simple face mask

## 2023-09-05 ENCOUNTER — Encounter (HOSPITAL_COMMUNITY): Payer: Self-pay | Admitting: Gastroenterology

## 2023-09-05 ENCOUNTER — Other Ambulatory Visit (HOSPITAL_COMMUNITY): Payer: Self-pay

## 2023-09-05 DIAGNOSIS — R066 Hiccough: Secondary | ICD-10-CM

## 2023-09-05 DIAGNOSIS — R1310 Dysphagia, unspecified: Secondary | ICD-10-CM

## 2023-09-05 DIAGNOSIS — R197 Diarrhea, unspecified: Secondary | ICD-10-CM | POA: Diagnosis not present

## 2023-09-05 DIAGNOSIS — K2210 Ulcer of esophagus without bleeding: Secondary | ICD-10-CM

## 2023-09-05 DIAGNOSIS — R112 Nausea with vomiting, unspecified: Secondary | ICD-10-CM | POA: Diagnosis not present

## 2023-09-05 DIAGNOSIS — R634 Abnormal weight loss: Secondary | ICD-10-CM

## 2023-09-05 DIAGNOSIS — N179 Acute kidney failure, unspecified: Secondary | ICD-10-CM | POA: Diagnosis not present

## 2023-09-05 DIAGNOSIS — K2081 Other esophagitis with bleeding: Secondary | ICD-10-CM

## 2023-09-05 DIAGNOSIS — E8729 Other acidosis: Secondary | ICD-10-CM | POA: Diagnosis not present

## 2023-09-05 LAB — BASIC METABOLIC PANEL WITH GFR
Anion gap: 13 (ref 5–15)
BUN: 27 mg/dL — ABNORMAL HIGH (ref 8–23)
CO2: 29 mmol/L (ref 22–32)
Calcium: 9.2 mg/dL (ref 8.9–10.3)
Chloride: 94 mmol/L — ABNORMAL LOW (ref 98–111)
Creatinine, Ser: 2.02 mg/dL — ABNORMAL HIGH (ref 0.61–1.24)
GFR, Estimated: 33 mL/min — ABNORMAL LOW (ref >60)
Glucose, Bld: 147 mg/dL — ABNORMAL HIGH (ref 70–99)
Potassium: 2.9 mmol/L — ABNORMAL LOW (ref 3.5–5.1)
Sodium: 136 mmol/L (ref 135–145)

## 2023-09-05 MED ORDER — MAGIC MOUTHWASH W/LIDOCAINE: 5.0000 mL | Freq: Four times a day (QID) | ORAL | Status: DC | PRN

## 2023-09-05 MED ORDER — SODIUM CHLORIDE 0.9 % IV SOLN: INTRAVENOUS | Status: AC

## 2023-09-05 MED ORDER — POTASSIUM CHLORIDE CRYS ER 20 MEQ PO TBCR
60.0000 meq | EXTENDED_RELEASE_TABLET | Freq: Two times a day (BID) | ORAL | Status: AC
  Administered 2023-09-05 – 2023-09-06 (×2): 60 meq via ORAL
  Filled 2023-09-05 (×2): qty 3

## 2023-09-05 MED ORDER — POTASSIUM CHLORIDE 20 MEQ PO PACK: 60.0000 meq | PACK | ORAL | Status: DC

## 2023-09-05 MED ORDER — NICOTINE 14 MG/24HR TD PT24
14.0000 mg | MEDICATED_PATCH | Freq: Every day | TRANSDERMAL | Status: DC
  Administered 2023-09-05 – 2023-09-07 (×3): 14 mg via TRANSDERMAL
  Filled 2023-09-05 (×3): qty 1

## 2023-09-05 NOTE — Progress Notes (Signed)
 Pt declined CPAP for the night   09/05/23 2204  BiPAP/CPAP/SIPAP  BiPAP/CPAP/SIPAP Pt Type Adult  Reason BIPAP/CPAP not in use Non-compliant (Pt declined cpap for the night)

## 2023-09-05 NOTE — Plan of Care (Incomplete)
  Problem: Education: Goal: Knowledge of General Education information will improve Description: Including pain rating scale, medication(s)/side effects and non-pharmacologic comfort measures Outcome: Progressing   Problem: Health Behavior/Discharge Planning: Goal: Ability to manage health-related needs will improve Outcome: Progressing   Problem: Clinical Measurements: Goal: Ability to maintain clinical measurements within normal limits will improve Outcome: Progressing Goal: Will remain free from infection Outcome: Progressing Goal: Diagnostic test results will improve Outcome: Progressing Goal: Respiratory complications will improve Outcome: Progressing   Problem: Activity: Goal: Risk for activity intolerance will decrease Outcome: Progressing   Problem: Elimination: Goal: Will not experience complications related to bowel motility Outcome: Progressing Goal: Will not experience complications related to urinary retention Outcome: Progressing   Problem: Safety: Goal: Ability to remain free from injury will improve Outcome: Progressing   Problem: Skin Integrity: Goal: Risk for impaired skin integrity will decrease Outcome: Progressing   Problem: Clinical Measurements: Goal: Cardiovascular complication will be avoided Outcome: Adequate for Discharge   Problem: Nutrition: Goal: Adequate nutrition will be maintained Outcome: Adequate for Discharge   Problem: Coping: Goal: Level of anxiety will decrease Outcome: Adequate for Discharge   Problem: Pain Managment: Goal: General experience of comfort will improve and/or be controlled Outcome: Adequate for Discharge

## 2023-09-05 NOTE — Anesthesia Postprocedure Evaluation (Signed)
 Anesthesia Post Note  Patient: Juan Torres  Procedure(s) Performed: ULTRASOUND, UPPER GI TRACT, ENDOSCOPIC EGD (ESOPHAGOGASTRODUODENOSCOPY) FINE NEEDLE ASPIRATION     Patient location during evaluation: Endoscopy Anesthesia Type: MAC Level of consciousness: awake and alert Pain management: pain level controlled Vital Signs Assessment: post-procedure vital signs reviewed and stable Respiratory status: spontaneous breathing, nonlabored ventilation, respiratory function stable and patient connected to nasal cannula oxygen Cardiovascular status: blood pressure returned to baseline and stable Postop Assessment: no apparent nausea or vomiting Anesthetic complications: no   No notable events documented.  Last Vitals:  Vitals:   09/05/23 0802 09/05/23 1204  BP: (!) 160/78 (!) 171/83  Pulse: (!) 48   Resp: 14 16  Temp: 36.5 C 36.5 C  SpO2: 97% 98%    Last Pain:  Vitals:   09/05/23 1204  TempSrc: Oral  PainSc:                  Jafeth Mustin L Mandeep Kiser

## 2023-09-05 NOTE — Plan of Care (Signed)

## 2023-09-05 NOTE — Progress Notes (Signed)
 Progress Note   Patient: Juan Torres FMW:[id number] DOB: 24-Oct-1945 DOA: 09/03/2023     1 DOS: the patient was seen and examined on 09/05/2023   Brief hospital course: 77 y.o. male with medical history significant of hypertension, hyperlipidemia, GERD, CKD 3, QT prolongation, mild dementia, OSA on CPAP, COPD, prostate cancer, tremor presenting with nausea vomiting and diarrhea.   Patient was recently admitted for alcoholic pancreatitis and pancreatic mass was discovered at that time.  Has been seen by [facility] outpatient with plan for [facility] provider to do pancreatic biopsy tomorrow.   No alcohol use for the past month until 3 days ago with anxiety increasing about mass.  Began to have significant nausea vomiting diarrhea yesterday and continued symptoms today.  No abdominal pain but does report some chills.   Denies fevers, chest pain, shortness of breath.  Assessment and Plan: AKI on CKD 3 Nausea, vomiting, diarrhea Elevated anion gap > Creatinine elevated to 3.32 in the setting of recent nausea vomiting diarrhea from baseline 1.3. > Anion gap elevated to 36 with normal bicarb.  Possibly compensated changes / mixed acidosis/alkalosis.  Some degree of chloride loss from GI symptoms contributing to gap anion. > Cr remains elevated but down to 2.02. Will continue IVF    Pancreatic mass > Recent pancreatitis, believed to be alcohol induced.  Pancreatic mass noted at that time. 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 Biopsy results pending -GI recs for PPI with carafate    Hypertension - Not currently on antihypertensives been on propranolol  which he also takes for tremor   Hyperlipidemia - Continue Zetia , fenofibrate , rosuvastatin    GERD - Continue PPI   Mild dementia - Noted   OSA - Continue home CPAP    COPD - Not on any inhalers   History of prostate cancer - Noted   Tremor - Continue propranolol    Alcohol use > As above, recent pancreatitis.  No alcohol for the past month until few days ago. - Continue thiamine , folate, multivitamin    Hypokalemia -remains low at 2.9 this AM -Continue to replace as tolerated. Pt did not tolerate PO liquid -Will try PO tabs -recheck bmet in AM -Will recheck Mg level in AM      Subjective: Eager to advance past liquid diet  Physical Exam: Vitals:   09/04/23 2000 09/05/23 0459 09/05/23 0802 09/05/23 1204  BP: (!) 142/72 (!) 147/77 (!) 160/78 (!) 171/83  Pulse: (!) 54 (!) 48 (!) 48 (!) 49  Resp: 17 17 14 16   Temp: 98.3 F (36.8 C) 98.2 F (36.8 C) 97.7 F (36.5 C) 97.7 F (36.5 C)  TempSrc: Oral Oral Oral Oral  SpO2: 97% 97% 97% 98%  Weight:      Height:       General exam: Conversant, in no acute distress Respiratory system: normal chest rise, clear, no audible wheezing Cardiovascular system: regular rhythm, s1-s2 Gastrointestinal system: Nondistended, nontender, pos BS Central nervous system: No seizures, no tremors Extremities: No cyanosis, no joint deformities Skin: No rashes, no pallor Psychiatry: Affect normal // no auditory hallucinations   Data Reviewed:  Labs reviewed: Na 136, K 2.9, Cr 2.02  Family Communication: Pt in room, family at bedside  Disposition: Status is: Inpatient Continue inpatient stayt because: severity of illness  Planned Discharge Destination: Home     Author: Garnette Pelt, MD 09/05/2023  5:25 PM  For on call review [email or web address].

## 2023-09-05 NOTE — Plan of Care (Signed)

## 2023-09-05 NOTE — Progress Notes (Signed)
 Progress Note  Primary GI: Dr. 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 Patient lying in bed stating he is very tired did not get any sleep last night.  Continues to have hiccups which he states also is affecting his sleep.  Currently just on full liquid diet but continuing to have pain with swallowing.  Carafate  was added yesterday continued on Protonix .  Had grade D esophagitis on EGD/EUS.  Denies any diarrhea.    Objective   Vital signs in last 24 hours: Temp:  [97.5 F (36.4 C)-98.3 F (36.8 C)] 97.7 F (36.5 C) (07/29 0802) Pulse Rate:  [48-64] 48 (07/29 0802) Resp:  [12-17] 14 (07/29 0802) BP: (122-170)/(42-79) 160/78 (07/29 0802) SpO2:  [93 %-99 %] 97 % (07/29 0802) Last BM Date : 09/02/23 Last BM recorded by nurses in past 5 days No data recorded  General:   Chronically ill-appearing male in no acute distress Head:  Normocephalic and atraumatic. Eyes: sclerae anicteric,conjunctive pink  Heart:  regular rate and rhythm, no murmurs or gallops Pulm: Clear anteriorly; no wheezing Abdomen:  Soft, obese AB, Active bowel sounds. No tenderness . Without guarding and Without rebound, No organomegaly appreciated. Extremities:  Without edema. Msk:  Symmetrical without gross deformities. Peripheral pulses intact.  Neurologic:  Alert and  oriented x4;  No focal deficits.  Skin:   Bilateral ecchymoses on the arms Psychiatric:  Cooperative. Normal mood and affect.  Poor historian  Intake/Output from previous day: 07/28 0701 - 07/29 0700 In: 400.3 [I.V.:400.3] Out: 1300 [Urine:1300] Intake/Output this shift: Total I/O In: 0  Out: 200 [Urine:200]  Studies/Results: No results found.  Lab Results: Recent Labs    09/03/23 1406 09/04/23 0339  WBC 13.9* 12.1*  HGB 17.1* 13.9  HCT 48.6 40.4  PLT 282 206   BMET Recent Labs     09/03/23 1949 09/04/23 0339 09/04/23 1531  NA 139 136 140  K 3.2* 3.0* 3.5  CL 78* 83* 91*  CO2 31 33* 35*  GLUCOSE 125* 117* 115*  BUN 37* 39* 37*  CREATININE 3.43* 3.03* 2.88*  CALCIUM  10.1 9.2 9.4   LFT Recent Labs    09/04/23 0339  PROT 6.1*  ALBUMIN 3.5  AST 22  ALT 13  ALKPHOS 32*  BILITOT 1.2   PT/INR No results for input(s): LABPROT, INR in the last 72 hours.   Scheduled Meds:  ezetimibe   10 mg Oral Daily   fenofibrate   160 mg Oral Daily   folic acid   1 mg Oral Daily   multivitamin with minerals  1 tablet Oral Daily   pantoprazole   40 mg Oral BID   propranolol  ER  120 mg Oral Daily   rosuvastatin   40 mg Oral Daily   sodium chloride  flush  3 mL Intravenous Q12H   sucralfate   1 g Oral TID WC & HS   thiamine   100 mg Oral Daily   Continuous Infusions:  sodium chloride  20 mL/hr at 09/04/23 1628   sodium chloride  75 mL/hr at 09/05/23 9147   09/04/2023 EUS Dr. Wilhelmenia EGD Impression: - No gross lesions in the proximal esophagus. LA Grade D erosive esophagitis with bleeding found in the middle and distal esophagus - biopsied. - 3 cm hiatal hernia. - Gastritis. Biopsied. - Duodenitis and duodenal ulcers with hemorrhage noted in the proximal duodenum. Biopsied. -  Congested major papilla. EUS impression: - A mass was identified in the pancreatic head. Cytology results are pending. However, the endosonographic appearance is suspicious for a neuroendocrine tumor. This was staged T2 N0 Mx by endosonographic criteria. The staging applies if malignancy is confirmed. Fine needle biopsy performed. - There was no sign of significant pathology in the common bile duct and in the common hepatic duct. - Hyperechoic material consistent with sludge was visualized endosonographically in the gallbladder. - No malignant- appearing lymph nodes were visualized in the celiac region ( level 20) , peripancreatic region and porta hepatis region.   Patient profile:   Juan Torres is a  78 y.o. male with past medical history significant for hypertension, hyperlipidemia, GERD, CKD stage III, QT prolongation, dementia, OSA on CPAP, COPD, prostate cancer, history of alcoholic pancreatitis and pancreatic mass.  GI service is asked to evaluate in the setting of abnormal imaging concerning for a pancreatic mass/lesion and possible neuroendocrine tumor who presents with progressive nausea/vomiting/failure to thrive with acute renal insufficiency.    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 Wilhelmenia suspicious neuroendocrine tumor by needle biopsy performed no malignant appearing lymph nodes Pending cytology No signs of pancreatitis today however patient is having a difficult time with advancing diet due to esophagitis -Follow-up cytology -Continue advance diet as tolerated  LA grade D erosive esophagitis and gastritis Likely secondary to alcohol, pending pathology/cytology -Continue PPI twice daily  -Continue Carafate  suspension 1 g 4 times daily for a month then twice daily depending upon kidney function -Will add on lidocaine  for patient is having a difficult time advance diet due to discomfort.  AKI with electrolyte imbalance  secondary to nausea vomiting diarrhea BUN 37 Cr 2.88 BUN 39 Cr 3.03  GFR 22  Potassium 3.5  Magnesium  1.8  -Improvement of potassium kidney function slowly -Continue to follow per hospitalist   Alcohol use with history of withdrawal Likely alcohol induced insomnia No evidence of withdrawals -Empiric treatment with folic acid , multivitamin and thiamine .  -Alcohol Abstinence counseling discussed with patient -Monitor for withdrawal symptoms while inpatient -Consider trazodone at night   Diarrhea, weight loss 12/02/2011 colonoscopy Dr. Debrah 3 benign polyps measuring 2 mm, diverticulosis, recall 10 years Negative C. difficile/GI pathogen May 2025 Likely secondary to  neuroendocrine tumor versus alcohol use versus other Has improved since being inpatient  Principal Problem:   ARF (acute renal failure) (HCC) Active Problems:   GERD (gastroesophageal reflux disease)   Hyperlipidemia, mixed   Essential hypertension   CKD (chronic kidney disease) stage 3, GFR 30-59 ml/min (HCC)   Emphysema lung (HCC)   OSA on CPAP   Mild dementia associated with alcoholism, with anxiety (HCC)   QT prolongation   Acute renal failure superimposed on stage 3a chronic kidney disease (HCC)   Pancreatic lesion   Abnormal MRI of abdomen   Bilious vomiting with nausea   Odynophagia   Acute esophagitis    LOS: 1 day   Alan JONELLE Coombs  09/05/2023, 10:17 AM

## 2023-09-06 ENCOUNTER — Ambulatory Visit: Payer: Self-pay | Admitting: Gastroenterology

## 2023-09-06 ENCOUNTER — Encounter (HOSPITAL_COMMUNITY): Payer: Self-pay | Admitting: Internal Medicine

## 2023-09-06 DIAGNOSIS — G4733 Obstructive sleep apnea (adult) (pediatric): Secondary | ICD-10-CM | POA: Diagnosis not present

## 2023-09-06 DIAGNOSIS — I10 Essential (primary) hypertension: Secondary | ICD-10-CM | POA: Diagnosis not present

## 2023-09-06 DIAGNOSIS — K219 Gastro-esophageal reflux disease without esophagitis: Secondary | ICD-10-CM | POA: Diagnosis not present

## 2023-09-06 DIAGNOSIS — R112 Nausea with vomiting, unspecified: Secondary | ICD-10-CM

## 2023-09-06 DIAGNOSIS — E8729 Other acidosis: Secondary | ICD-10-CM

## 2023-09-06 DIAGNOSIS — K2210 Ulcer of esophagus without bleeding: Secondary | ICD-10-CM

## 2023-09-06 DIAGNOSIS — N1832 Chronic kidney disease, stage 3b: Secondary | ICD-10-CM

## 2023-09-06 DIAGNOSIS — K8689 Other specified diseases of pancreas: Secondary | ICD-10-CM

## 2023-09-06 DIAGNOSIS — N179 Acute kidney failure, unspecified: Secondary | ICD-10-CM | POA: Diagnosis not present

## 2023-09-06 LAB — COMPREHENSIVE METABOLIC PANEL WITH GFR
ALT: 11 U/L (ref 0–44)
AST: 18 U/L (ref 15–41)
Albumin: 3.1 g/dL — ABNORMAL LOW (ref 3.5–5.0)
Alkaline Phosphatase: 28 U/L — ABNORMAL LOW (ref 38–126)
Anion gap: 15 (ref 5–15)
BUN: 24 mg/dL — ABNORMAL HIGH (ref 8–23)
CO2: 23 mmol/L (ref 22–32)
Calcium: 8.8 mg/dL — ABNORMAL LOW (ref 8.9–10.3)
Chloride: 99 mmol/L (ref 98–111)
Creatinine, Ser: 1.64 mg/dL — ABNORMAL HIGH (ref 0.61–1.24)
GFR, Estimated: 43 mL/min — ABNORMAL LOW (ref >60)
Glucose, Bld: 111 mg/dL — ABNORMAL HIGH (ref 70–99)
Potassium: 3.4 mmol/L — ABNORMAL LOW (ref 3.5–5.1)
Sodium: 137 mmol/L (ref 135–145)
Total Bilirubin: 1.6 mg/dL — ABNORMAL HIGH (ref 0.0–1.2)
Total Protein: 5.5 g/dL — ABNORMAL LOW (ref 6.5–8.1)

## 2023-09-06 LAB — BASIC METABOLIC PANEL WITH GFR
Anion gap: 8 (ref 5–15)
BUN: 20 mg/dL (ref 8–23)
CO2: 25 mmol/L (ref 22–32)
Calcium: 9.2 mg/dL (ref 8.9–10.3)
Chloride: 105 mmol/L (ref 98–111)
Creatinine, Ser: 1.56 mg/dL — ABNORMAL HIGH (ref 0.61–1.24)
GFR, Estimated: 45 mL/min — ABNORMAL LOW (ref >60)
Glucose, Bld: 149 mg/dL — ABNORMAL HIGH (ref 70–99)
Potassium: 4 mmol/L (ref 3.5–5.1)
Sodium: 138 mmol/L (ref 135–145)

## 2023-09-06 LAB — MAGNESIUM: Magnesium: 1.6 mg/dL — ABNORMAL LOW (ref 1.7–2.4)

## 2023-09-06 LAB — CBC
HCT: 38.4 % — ABNORMAL LOW (ref 39.0–52.0)
Hemoglobin: 13.7 g/dL (ref 13.0–17.0)
MCH: 36.9 pg — ABNORMAL HIGH (ref 26.0–34.0)
MCHC: 35.7 g/dL (ref 30.0–36.0)
MCV: 103.5 fL — ABNORMAL HIGH (ref 80.0–100.0)
Platelets: 183 K/uL (ref 150–400)
RBC: 3.71 MIL/uL — ABNORMAL LOW (ref 4.22–5.81)
RDW: 12 % (ref 11.5–15.5)
WBC: 5.7 K/uL (ref 4.0–10.5)
nRBC: 0 % (ref 0.0–0.2)

## 2023-09-06 LAB — CYTOLOGY - NON PAP

## 2023-09-06 LAB — GASTRIN: Gastrin: 2097 pg/mL — ABNORMAL HIGH (ref 0–115)

## 2023-09-06 MED ORDER — MAGNESIUM OXIDE -MG SUPPLEMENT 400 (240 MG) MG PO TABS
400.0000 mg | ORAL_TABLET | Freq: Every evening | ORAL | Status: DC
  Administered 2023-09-06: 400 mg via ORAL
  Filled 2023-09-06: qty 1

## 2023-09-06 MED ORDER — HYDROXYZINE HCL 25 MG PO TABS
25.0000 mg | ORAL_TABLET | Freq: Three times a day (TID) | ORAL | Status: DC | PRN
  Administered 2023-09-06: 25 mg via ORAL
  Filled 2023-09-06: qty 1

## 2023-09-06 MED ORDER — VITAMIN D3 25 MCG (1000 UNIT) PO TABS
10000.0000 [IU] | ORAL_TABLET | Freq: Every morning | ORAL | Status: DC
  Administered 2023-09-06 – 2023-09-07 (×2): 10000 [IU] via ORAL
  Filled 2023-09-06 (×2): qty 10

## 2023-09-06 MED ORDER — MELATONIN 3 MG PO TABS
3.0000 mg | ORAL_TABLET | Freq: Every evening | ORAL | Status: DC | PRN
  Administered 2023-09-06 (×2): 3 mg via ORAL
  Filled 2023-09-06 (×2): qty 1

## 2023-09-06 MED ORDER — POTASSIUM CHLORIDE CRYS ER 10 MEQ PO TBCR
40.0000 meq | EXTENDED_RELEASE_TABLET | Freq: Once | ORAL | Status: AC
  Administered 2023-09-06: 40 meq via ORAL
  Filled 2023-09-06: qty 4

## 2023-09-06 MED ORDER — VITAMIN B-12 100 MCG PO TABS
500.0000 ug | ORAL_TABLET | Freq: Every day | ORAL | Status: DC
  Administered 2023-09-06 – 2023-09-07 (×2): 500 ug via ORAL
  Filled 2023-09-06 (×2): qty 5

## 2023-09-06 MED ORDER — MENTHOL 3 MG MT LOZG
1.0000 | LOZENGE | OROMUCOSAL | Status: DC | PRN
  Administered 2023-09-06: 3 mg via ORAL
  Filled 2023-09-06: qty 9

## 2023-09-06 MED ORDER — MAGNESIUM SULFATE 4 GM/100ML IV SOLN
4.0000 g | Freq: Once | INTRAVENOUS | Status: AC
  Administered 2023-09-06: 4 g via INTRAVENOUS
  Filled 2023-09-06: qty 100

## 2023-09-06 MED ORDER — SODIUM CHLORIDE 0.9 % IV SOLN: INTRAVENOUS | Status: DC

## 2023-09-06 NOTE — Plan of Care (Signed)
   Problem: Clinical Measurements: Goal: Will remain free from infection Outcome: Progressing   Problem: Clinical Measurements: Goal: Diagnostic test results will improve Outcome: Progressing   Problem: Clinical Measurements: Goal: Respiratory complications will improve Outcome: Progressing   Problem: Clinical Measurements: Goal: Cardiovascular complication will be avoided Outcome: Progressing

## 2023-09-06 NOTE — Progress Notes (Addendum)
 PROGRESS NOTE    Juan Torres  FMW:[id number] DOB: 1945/09/23 DOA: 09/03/2023 PCP: Joshua Debby CROME, MD    Chief Complaint  Patient presents with   Nausea   Emesis    Brief Narrative:  78 y.o. male with medical history significant of hypertension, hyperlipidemia, GERD, CKD 3, QT prolongation, mild dementia, OSA on CPAP, COPD, prostate cancer, tremor presenting with nausea vomiting and diarrhea.   Patient was recently admitted for alcoholic pancreatitis and pancreatic mass was discovered at that time.  Has been seen by [facility] outpatient with plan for [place] [facility] provider to do pancreatic biopsy tomorrow.   No alcohol use for the past month until 3 days ago with anxiety increasing about mass.  Began to have significant nausea vomiting diarrhea yesterday and continued symptoms today.  No abdominal pain but does report some chills.   Denies fevers, chest pain, shortness of breath.   Assessment & Plan:   Principal Problem:   ARF (acute renal failure) (HCC) Active Problems:   GERD (gastroesophageal reflux disease)   Hyperlipidemia, mixed   Essential hypertension   CKD (chronic kidney disease) stage 3, GFR 30-59 ml/min (HCC)   Emphysema lung (HCC)   OSA on CPAP   Mild dementia associated with alcoholism, with anxiety (HCC)   QT prolongation   Acute renal failure superimposed on stage 3a chronic kidney disease (HCC)   Pancreatic lesion   Abnormal MRI of abdomen   Bilious vomiting with nausea   Odynophagia   Acute esophagitis   Erosive esophagitis   Unintentional weight loss   High anion gap metabolic acidosis   Nausea vomiting and diarrhea  #1 AKI on CKD stage IIIb/elevated anion gap -Secondary to prerenal azotemia in the setting of nausea vomiting diarrhea - Patient noted on admission to have a creatinine up to 3.32 with baseline creatinine of 1.3. - Anion gap elevated to 36 with normal bicarb felt likely compensated changes of mixed acidosis/alkalosis and some chloride loss  from GI symptoms. - Urine output of 1.4 L recorded over the past 24 hours. - Renal function improving with creatinine trending down currently at 1.56. - Continue IV fluids. - Follow.  2.  Nausea vomiting diarrhea -Clinical improvement. - May be secondary to neuroendocrine tumor noted on biopsy per GI. - Supportive care. - IV antiemetics.  3.  Pancreatic mass/LA grade 4 esophagitis, gastritis, duodenitis and duodenal ulcers -Patient with recent pancreatitis believed to be alcohol induced and pancreatic mass noted at that time. - Patient seen in consultation by GI. - Patient underwent EUS by Dr. Wilhelmenia on 09/03/2023 with findings of erosive esophagitis grade D with bleeding in the middle and distal esophagus which was biopsied, gastritis, duodenitis and duodenal ulcers with hemorrhage noted in the proximal duodenum, congested major papula.  EUS with mass identified in the pancreatic head with endoscopic appearance suspicious for neuroendocrine tumor, fine-needle biopsy performed, hyperechoic material consistent with sludge visualized endosonographically in the gallbladder, no malignant appearing lymph nodes visualized in the celiac region, peripancreatic region and porta hepatis region. - GI recommending PPI twice daily, Carafate  1 g 4 times daily daily for 1 month then twice daily. 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 -General surgery informed of biopsy findings per GI. -CT chest ordered for staging. - Will also consult with oncology for further evaluation and management.  4.  Hypertension -Not on any antihypertensive medications except propranolol  which she takes for tremors.  5.  Hyperlipidemia -Continue Zetia , fenofibrate , rosuvastatin .  6.  GERD -PPI.  7.  Hypomagnesemia/hypokalemia -Secondary to GI losses -Magnesium  at 1.6, potassium at 3.4. - Magnesium  sulfate 4 g IV x 1. - Repeat labs in the AM.  8.  11 beat NSVT - Was called by RN that patient with a  asymptomatic 11 beat run of NSVT however remained asymptomatic. - BMET and magnesium  ordered with potassium currently at 4.0 and magnesium  at 1.6. - Replete magnesium  and potassium to keep magnesium  approximately 2, potassium approximately 4 - Patient currently on propranolol . - Follow.  9.  Mild dementia  10.  OSA -CPAP nightly.  11.  COPD -Stable. - Not on any inhalers.  12.  History of prostate cancer  13.  Tremors -Continue propranolol .  14.  History of alcohol use -Patient noted with recent pancreatitis. - Patient noted to have abstained from alcohol for the past month until a few days ago prior to admission. - Patient with no signs of withdrawal. - Continue thiamine , folate, multivitamin.    DVT prophylaxis: SCDs Code Status: Full Family Communication: Updated patient.  No family at bedside. Disposition: Home when clinically improved and cleared by GI.  Status is: Inpatient Remains inpatient appropriate because: Severity of illness   Consultants:  Gastroenterology: Dr. Wilhelmenia 09/04/2023  Procedures:  Upper EUS per GI: Dr. Wilhelmenia 09/04/2023   Antimicrobials:  Anti-infectives (From admission, onward)    None         Subjective: Patient sitting up in chair.  Feels nausea vomiting diarrhea has improved.  Denies any abdominal pain.  Tolerating oral intake.  Objective: Vitals:   09/05/23 2038 09/06/23 0525 09/06/23 1240 09/06/23 2011  BP: (!) 144/76 (!) 149/81 (!) 166/89 (!) 165/72  Pulse: (!) 51 (!) 54 (!) 53 (!) 55  Resp: 16 16 16 18   Temp: 98.3 F (36.8 C) 97.6 F (36.4 C) 97.7 F (36.5 C) 98.2 F (36.8 C)  TempSrc: Oral Oral Oral Oral  SpO2: 98% 96% 98% 100%  Weight:      Height:        Intake/Output Summary (Last 24 hours) at 09/06/2023 2119 Last data filed at 09/06/2023 1609 Gross per 24 hour  Intake 1603.02 ml  Output 600 ml  Net 1003.02 ml   Filed Weights   09/03/23 1342 09/03/23 1931  Weight: 68 kg 68 kg     Examination:  General exam: Appears calm and comfortable  Respiratory system: Clear to auscultation.  No wheezes, no crackles, no rhonchi.  Fair air movement.  Respiratory effort normal. Cardiovascular system: S1 & S2 heard, RRR. No JVD, murmurs, rubs, gallops or clicks. No pedal edema. Gastrointestinal system: Abdomen is nondistended, soft and nontender. No organomegaly or masses felt. Normal bowel sounds heard. Central nervous system: Alert and oriented. No focal neurological deficits. Extremities: Symmetric 5 x 5 power. Skin: No rashes, lesions or ulcers Psychiatry: Judgement and insight appear normal. Mood & affect appropriate.     Data Reviewed: I have personally reviewed following labs and imaging studies  CBC: Recent Labs  Lab 09/03/23 1406 09/04/23 0339 09/06/23 0428  WBC 13.9* 12.1* 5.7  NEUTROABS 11.4*  --   --   HGB 17.1* 13.9 13.7  HCT 48.6 40.4 38.4*  MCV 102.5* 104.1* 103.5*  PLT 282 206 183    Basic Metabolic Panel: Recent Labs  Lab 09/03/23 1949 09/04/23 0339 09/04/23 1531 09/05/23 1403 09/06/23 0428 09/06/23 1901  NA 139 136 140 136 137 138  K 3.2* 3.0* 3.5 2.9* 3.4* 4.0  CL 78* 83* 91* 94* 99 105  CO2  31 33* 35* 29 23 25   GLUCOSE 125* 117* 115* 147* 111* 149*  BUN 37* 39* 37* 27* 24* 20  CREATININE 3.43* 3.03* 2.88* 2.02* 1.64* 1.56*  CALCIUM  10.1 9.2 9.4 9.2 8.8* 9.2  MG 1.8  --   --   --   --  1.6*    GFR: Estimated Creatinine Clearance: 38.1 mL/min (A) (by C-G formula based on SCr of 1.56 mg/dL (H)).  Liver Function Tests: Recent Labs  Lab 09/03/23 1406 09/03/23 1949 09/04/23 0339 09/06/23 0428  AST 22 28 22 18   ALT 14 14 13 11   ALKPHOS 44 38 32* 28*  BILITOT 2.5* 2.0* 1.2 1.6*  PROT 8.2* 7.5 6.1* 5.5*  ALBUMIN 4.7 4.1 3.5 3.1*    CBG: No results for input(s): GLUCAP in the last 168 hours.   No results found for this or any previous visit (from the past 240 hours).       Radiology Studies: No results  found.      Scheduled Meds:  cholecalciferol   10,000 Units Oral q morning   ezetimibe   10 mg Oral Daily   fenofibrate   160 mg Oral Daily   folic acid   1 mg Oral Daily   magnesium  oxide  400 mg Oral QPM   multivitamin with minerals  1 tablet Oral Daily   nicotine   14 mg Transdermal Daily   pantoprazole   40 mg Oral BID   propranolol  ER  120 mg Oral Daily   rosuvastatin   40 mg Oral Daily   sodium chloride  flush  3 mL Intravenous Q12H   sucralfate   1 g Oral TID WC & HS   thiamine   100 mg Oral Daily   cyanocobalamin   500 mcg Oral Daily   Continuous Infusions:  sodium chloride  75 mL/hr at 09/06/23 1609   magnesium  sulfate bolus IVPB       LOS: 2 days    Time spent: 40 minutes    Toribio Hummer, MD [facility]   To contact the attending provider between 7A-7P or the covering provider during after hours 7P-7A, please log into the web site [email or web address] and access using universal [place] password for that web site. If you do not have the password, please call the hospital operator.  09/06/2023, 9:19 PM

## 2023-09-06 NOTE — TOC CM/SW Note (Signed)
 Transition of Care [facility]) - Inpatient Brief Assessment   Patient Details  Name: Juan Torres MRN: [id number] Date of Birth: 05/01/1945  Transition of Care [facility]) CM/SW Contact:    [name], LCSW Phone Number: 09/06/2023, 10:39 AM    Transition of Care Asessment: Insurance and Status: Insurance coverage has been reviewed Patient has primary care physician: Yes Home environment has been reviewed: home with sposue Prior level of function:: indepdnent Prior/Current Home Services: No current home services Social Drivers of Health Review: SDOH reviewed no interventions necessary Readmission risk has been reviewed: Yes Transition of care needs: no transition of care needs at this time

## 2023-09-06 NOTE — Progress Notes (Signed)
 Noted KUB order entered

## 2023-09-06 NOTE — Progress Notes (Signed)
 Pt c/o hiccups and inability to sleep.  On call provider notified and orders obtained.

## 2023-09-06 NOTE — Progress Notes (Signed)
   09/06/23 2102  BiPAP/CPAP/SIPAP  BiPAP/CPAP/SIPAP Pt Type Adult  Reason BIPAP/CPAP not in use Non-compliant (pt refused cpap for the night)

## 2023-09-07 ENCOUNTER — Other Ambulatory Visit (HOSPITAL_COMMUNITY): Payer: Self-pay

## 2023-09-07 ENCOUNTER — Inpatient Hospital Stay (HOSPITAL_COMMUNITY)

## 2023-09-07 DIAGNOSIS — F1027 Alcohol dependence with alcohol-induced persisting dementia: Secondary | ICD-10-CM | POA: Diagnosis not present

## 2023-09-07 DIAGNOSIS — N179 Acute kidney failure, unspecified: Secondary | ICD-10-CM | POA: Diagnosis not present

## 2023-09-07 DIAGNOSIS — D3A8 Other benign neuroendocrine tumors: Secondary | ICD-10-CM

## 2023-09-07 DIAGNOSIS — K869 Disease of pancreas, unspecified: Secondary | ICD-10-CM

## 2023-09-07 DIAGNOSIS — I10 Essential (primary) hypertension: Secondary | ICD-10-CM | POA: Diagnosis not present

## 2023-09-07 LAB — CBC WITH DIFFERENTIAL/PLATELET
Abs Immature Granulocytes: 0.02 K/uL (ref 0.00–0.07)
Basophils Absolute: 0 K/uL (ref 0.0–0.1)
Basophils Relative: 1 %
Eosinophils Absolute: 0.1 K/uL (ref 0.0–0.5)
Eosinophils Relative: 2 %
HCT: 39.9 % (ref 39.0–52.0)
Hemoglobin: 14.1 g/dL (ref 13.0–17.0)
Immature Granulocytes: 0 %
Lymphocytes Relative: 24 %
Lymphs Abs: 1.4 K/uL (ref 0.7–4.0)
MCH: 37.3 pg — ABNORMAL HIGH (ref 26.0–34.0)
MCHC: 35.3 g/dL (ref 30.0–36.0)
MCV: 105.6 fL — ABNORMAL HIGH (ref 80.0–100.0)
Monocytes Absolute: 0.8 K/uL (ref 0.1–1.0)
Monocytes Relative: 13 %
Neutro Abs: 3.6 K/uL (ref 1.7–7.7)
Neutrophils Relative %: 60 %
Platelets: 195 K/uL (ref 150–400)
RBC: 3.78 MIL/uL — ABNORMAL LOW (ref 4.22–5.81)
RDW: 11.9 % (ref 11.5–15.5)
WBC: 5.9 K/uL (ref 4.0–10.5)
nRBC: 0 % (ref 0.0–0.2)

## 2023-09-07 LAB — SURGICAL PATHOLOGY

## 2023-09-07 LAB — BASIC METABOLIC PANEL WITH GFR
Anion gap: 9 (ref 5–15)
BUN: 18 mg/dL (ref 8–23)
CO2: 23 mmol/L (ref 22–32)
Calcium: 8.9 mg/dL (ref 8.9–10.3)
Chloride: 106 mmol/L (ref 98–111)
Creatinine, Ser: 1.43 mg/dL — ABNORMAL HIGH (ref 0.61–1.24)
GFR, Estimated: 50 mL/min — ABNORMAL LOW (ref >60)
Glucose, Bld: 101 mg/dL — ABNORMAL HIGH (ref 70–99)
Potassium: 3.9 mmol/L (ref 3.5–5.1)
Sodium: 138 mmol/L (ref 135–145)

## 2023-09-07 LAB — MAGNESIUM: Magnesium: 2.7 mg/dL — ABNORMAL HIGH (ref 1.7–2.4)

## 2023-09-07 MED ORDER — ACETAMINOPHEN 325 MG PO TABS
650.0000 mg | ORAL_TABLET | Freq: Four times a day (QID) | ORAL | Status: AC | PRN
Start: 2023-09-07 — End: ?

## 2023-09-07 MED ORDER — LOPERAMIDE HCL 2 MG PO TABS: 2.0000 mg | ORAL_TABLET | Freq: Four times a day (QID) | ORAL | Status: DC | PRN

## 2023-09-07 MED ORDER — ONDANSETRON 4 MG PO TBDP
4.0000 mg | ORAL_TABLET | Freq: Three times a day (TID) | ORAL | 0 refills | Status: AC | PRN
  Filled 2023-09-07: qty 20, 7d supply, fill #0

## 2023-09-07 MED ORDER — SUCRALFATE 1 GM/10ML PO SUSP
ORAL | 0 refills | Status: DC
  Filled 2023-09-07: qty 420, 11d supply, fill #0

## 2023-09-07 MED ORDER — PANTOPRAZOLE SODIUM 40 MG PO TBEC
40.0000 mg | DELAYED_RELEASE_TABLET | Freq: Two times a day (BID) | ORAL | 0 refills | Status: DC
  Filled 2023-09-07: qty 180, 90d supply, fill #0

## 2023-09-07 NOTE — Consult Note (Addendum)
 [facility] CONSULT NOTE  Patient Care Team: Joshua Debby CROME, MD as PCP - General (Internal Medicine) Jason Charleston, MD (Inactive) as Consulting Physician (Radiation Oncology) Octavia Charleston, MD as Consulting Physician (Ophthalmology) Douglass Lunger, MD as Consulting Physician (Nephrology) Mansouraty, Aloha Raddle., MD as Consulting Physician (Gastroenterology)  CHIEF COMPLAINTS/PURPOSE OF CONSULTATION:  Pancreatic neuroendocrine tumor  REFERRING PHYSICIAN: Dr. Sebastian  HISTORY OF PRESENTING ILLNESS:  ARIV PENROD 78 y.o. male who was admitted on 09/04/2023 for evaluation of pancreatic mass, dysphagia and esophagitis.  Cytology confirmed neuroendocrine neoplasm of pancreas.  Therefore oncology consult has been requested. Patient is seen assessed and examined today.  He is awake alert and oriented x 3.  Denies chest pain, nausea, vomiting or other acute GI symptoms.  He is hard of hearing, wears hearing aids.  Patient's wife is at bedside, she is a former ED nurse at [employer] and served as principal historian. Patient had pancreatitis in June of this year with rapid weight loss of 13 pounds.  Went to see PCP Dr. Debby Joshua in June and MRI abdomen was ordered.  Pancreatic mass was noted.   Patient was sent to see Dr. Allen/surgery who recommended biopsy.  They were told to see GI Dr. Wilhelmenia.  However, patient began to feel more ill with complaints of nausea, vomiting, diarrhea.  Wife states that she tried Zofran  and Imodium  with minimal relief.  States he became very dehydrated and PCP advised hospital evaluation. Medical history significant for prostate cancer with seed implant 10 to 15 years ago, hypertension, hyperlipidemia. Surgical history includes 2 hernia repairs, first one was bilateral 10 years ago and second one was right-sided done March 2025. Family history includes mother with breast cancer in maternal grandfather with colon cancer. Social history significant for  heavy alcohol use, admits to 1 to 2 pints of vodka per day for a very long time.  Admits to tobacco use with cigarettes on and off for several years quit a few years ago.  Admits to smoking cigars Black and milds 3 cigars/day, wears nicotine  patch.  Denies illicit drug use.  Patient is a retired Radiation protection practitioner.    I have reviewed his chart and materials related to his cancer extensively and collaborated history with the patient. Summary of oncologic history is as follows: Oncology History   No history exists.    ASSESSMENT & PLAN:  Pancreatic neuroendocrine tumor Esophagitis Right pleural effusion, trace -Newly diagnosed - MR abdomen done 07/12/2023 shows rounded homogenous mass arising from dorsal pancreatic uncinates 3.2 x 2.5 cm, most characteristic of pancreatic neuroendocrine tumor. - FNA cytology 7/28 confirms neuroendocrine neoplasm - CT chest done 7/31 negative for mets - Tumor markers ordered and pending - Continue PPI per orders - Medical oncology/Dr. Odean will evaluate patient today and make further evaluation recommendations.  Nausea/vomiting Diarrhea - Improved significantly - May be secondary to malignancy - Continue antiemetics and supportive care  CKD - Patient with elevated creatinine 3.43 on admission.  Has decreased nicely 1.43 today.  BUN 37 on admission-now normalized 18.  GFR improving significantly. - Continue gentle IV hydration - Avoid nephrotoxic agents - Continue to monitor renal function  Hypertension Hyperlipidemia - Monitor BP closely - Continue statins as ordered  EtOH abuse History of pancreatitis - Patient with a longstanding history of heavy alcohol use - Continue CIWA protocol  History of prostate cancer - Status post radioactive seed implants 10 to 15 years ago - Follows with outpatient urology   MEDICAL HISTORY:  Past Medical History:  Diagnosis Date   Adenomatous colon polyp    Anxiety    Arthritis    FEET   COPD (chronic  obstructive pulmonary disease) (HCC)    Depression    Familial tremor    GERD (gastroesophageal reflux disease)    Headache    When younger   History of basal cell cancer 2012   Hyperlipidemia    Hypertension    Nausea vomiting and diarrhea 09/06/2023   Prediabetes    Prostate cancer (HCC) DX 12/19/11   bx=Adenocarcinoma,gleason=3+3=6,volume=42cc,PSA=4.63   PTSD (post-traumatic stress disorder)    Pulmonary nodules 12/10/2018   Numerous unchanged/benign appearing per CT lung 12/2018   Sleep apnea    no cpap   Vitamin D  deficiency     SURGICAL HISTORY: Past Surgical History:  Procedure Laterality Date   COLONOSCOPY W/ POLYPECTOMY     Adenomatous   CYSTOSCOPY N/A 04/04/2012   Procedure: CYSTOSCOPY;  Surgeon: Toribio Neysa Repine, MD;  Location: [facility];  Service: Urology;  Laterality: N/A;   ESOPHAGOGASTRODUODENOSCOPY N/A 09/04/2023   Procedure: EGD (ESOPHAGOGASTRODUODENOSCOPY);  Surgeon: Wilhelmenia Aloha Raddle., MD;  Location: [facility];  Service: Gastroenterology;  Laterality: N/A;   EUS N/A 09/04/2023   Procedure: ULTRASOUND, UPPER GI TRACT, ENDOSCOPIC;  Surgeon: Wilhelmenia Aloha Raddle., MD;  Location: [facility];  Service: Gastroenterology;  Laterality: N/A;   EYE SURGERY     bilateral cataracts    FINE NEEDLE ASPIRATION  09/04/2023   Procedure: FINE NEEDLE ASPIRATION;  Surgeon: Wilhelmenia Aloha Raddle., MD;  Location: [facility];  Service: Gastroenterology;;   INGUINAL HERNIA REPAIR Bilateral 10/18/2018   Procedure: LAPAROSCOPIC BILATERAL INGUINAL HERNIA REPAIR;  Surgeon: Gladis Cough, MD;  Location: [facility];  Service: General;  Laterality: Bilateral;   INGUINAL HERNIA REPAIR Right 04/25/2023   Procedure: OPEN RIGHT INGUINAL HERNIA REPAIR WITH MESH;  Surgeon: Signe Mitzie LABOR, MD;  Location: [facility];  Service: General;  Laterality: Right;   PROSTATE BIOPSY  12/19/2011   Procedure: BIOPSY TRANSRECTAL ULTRASONIC PROSTATE (TUBP);  Surgeon: Toribio Neysa Repine, MD;  Location[facility];  Service: Urology;  Laterality: N/A;     RADIOACTIVE SEED IMPLANT N/A 04/04/2012   Procedure: RADIOACTIVE SEED IMPLANT;  Surgeon: Toribio Neysa Repine, MD;  Location: [facility];  Service: Urology;  Laterality: N/A;  Seeds Implanted     72  Seeds Found in Bladder     None     SOCIAL HISTORY: Social History   Socioeconomic History   Marital status: Married    Spouse name: Not on file   Number of children: 2   Years of education: Not on file   Highest education level: Not on file  Occupational History   Not on file  Tobacco Use   Smoking status: Every Day    Current packs/day: 1.00    Average packs/day: 1 pack/day for 34.7 years (34.7 ttl pk-yrs)    Types: Cigarettes, Cigars    Start date: 12/10/1988   Smokeless tobacco: Never   Tobacco comments:    Smokes Black and milds and using a Nicotine   Vaping Use   Vaping status: Never Used  Substance and Sexual Activity   Alcohol use: Yes    Alcohol/week: 1.0 standard drink of alcohol    Types: 1 Shots of liquor per week    Comment: occ   Drug use: Not Currently    Frequency: 7.0 times per week    Types: Marijuana    Comment: /  marijuana-last smoked 10/15/2018   Sexual activity: Not Currently  Other Topics Concern   Not on file  Social History Narrative   Lives at home with spouse   Right handed   Caffeine: none   Social Drivers of Corporate investment banker Strain: Low Risk  (08/07/2023)   Overall Financial Resource Strain (CARDIA)    Difficulty of Paying Living Expenses: Not hard at all  Food Insecurity: No Food Insecurity (09/03/2023)   Hunger Vital Sign    Worried About Running Out of Food in the Last Year: Never true    Ran Out of Food in the Last Year: Never true  Transportation Needs: No Transportation Needs (09/03/2023)   PRAPARE - Administrator, Civil Service (Medical): No    Lack of Transportation (Non-Medical): No  Physical  Activity: Insufficiently Active (08/07/2023)   Exercise Vital Sign    Days of Exercise per Week: 2 days    Minutes of Exercise per Session: 60 min  Stress: No Stress Concern Present (08/07/2023)   [employer] of Occupational Health - Occupational Stress Questionnaire    Feeling of Stress: Only a little  Social Connections: Socially Integrated (09/03/2023)   Social Connection and Isolation Panel    Frequency of Communication with Friends and Family: More than three times a week    Frequency of Social Gatherings with Friends and Family: More than three times a week    Attends Religious Services: More than 4 times per year    Active Member of [employer] or Organizations: Yes    Attends Engineer, structural: More than 4 times per year    Marital Status: Married  Catering manager Violence: Not At Risk (09/03/2023)   Humiliation, Afraid, Rape, and Kick questionnaire    Fear of Current or Ex-Partner: No    Emotionally Abused: No    Physically Abused: No    Sexually Abused: No    FAMILY HISTORY: Family History  Problem Relation Age of Onset   Breast cancer Mother    Heart attack Father 61   Dementia Father    Prostate cancer Maternal Uncle        prostate   Ulcers Maternal Grandmother    Cancer Maternal Grandfather        colon   Prostate cancer Cousin        prostate cancer 1st maternal    Sleep apnea Neg Hx      PHYSICAL EXAMINATION: ECOG PERFORMANCE STATUS: 3 - Symptomatic, >50% confined to bed  Vitals:   09/06/23 2011 09/07/23 0312  BP: (!) 165/72 (!) 149/79  Pulse: (!) 55 (!) 48  Resp: 18 18  Temp: 98.2 F (36.8 C) 97.6 F (36.4 C)  SpO2: 100% 98%   Filed Weights   09/03/23 1342 09/03/23 1931  Weight: 150 lb (68 kg) 149 lb 14.6 oz (68 kg)    GENERAL: alert, no distress and comfortable +hard of hearing SKIN: skin color, texture, turgor are normal, no rashes or significant lesions EYES: normal, conjunctiva are pink and non-injected, sclera  clear OROPHARYNX: no exudate, no erythema and lips, buccal mucosa, and tongue normal  NECK: supple, thyroid  normal size, non-tender, without nodularity LYMPH: no palpable lymphadenopathy in the cervical, axillary or inguinal LUNGS: clear to auscultation and percussion with normal breathing effort HEART: regular rate & rhythm and no murmurs and no lower extremity edema ABDOMEN: abdomen soft, non-tender and normal bowel sounds MUSCULOSKELETAL: no cyanosis of digits and no clubbing  PSYCH: alert &  oriented x 3 with fluent speech NEURO: no focal motor/sensory deficits   ALLERGIES:  is allergic to lipitor [atorvastatin], niacin and related, and penicillins.  MEDICATIONS:  Current Facility-Administered Medications  Medication Dose Route Frequency Provider Last Rate Last Admin   acetaminophen  (TYLENOL ) tablet 650 mg  650 mg Oral Q6H PRN Seena Marsa NOVAK, MD       Or   acetaminophen  (TYLENOL ) suppository 650 mg  650 mg Rectal Q6H PRN Seena Marsa NOVAK, MD       cholecalciferol  (VITAMIN D3) tablet 10,000 Units  10,000 Units Oral q morning Sebastian Toribio GAILS, MD   10,000 Units at 09/07/23 1116   ezetimibe  (ZETIA ) tablet 10 mg  10 mg Oral Daily Melvin, Alexander B, MD   10 mg at 09/07/23 1116   fenofibrate  tablet 160 mg  160 mg Oral Daily Melvin, Alexander B, MD   160 mg at 09/07/23 1116   folic acid  (FOLVITE ) tablet 1 mg  1 mg Oral Daily Melvin, Alexander B, MD   1 mg at 09/07/23 1116   hydrOXYzine  (ATARAX ) tablet 25 mg  25 mg Oral TID PRN Amponsah, Prosper M, MD   25 mg at 09/06/23 0202   magic mouthwash w/lidocaine   5 mL Oral QID PRN Collier, Amanda R, PA-C       magnesium  oxide (MAG-OX) tablet 400 mg  400 mg Oral QPM Sebastian Toribio GAILS, MD   400 mg at 09/06/23 1829   melatonin tablet 3 mg  3 mg Oral QHS PRN Amponsah, Prosper M, MD   3 mg at 09/06/23 2140   menthol -cetylpyridinium (CEPACOL) lozenge 3 mg  1 lozenge Oral PRN Sebastian Toribio GAILS, MD   3 mg at 09/06/23 0851   multivitamin with  minerals tablet 1 tablet  1 tablet Oral Daily Melvin, Alexander B, MD   1 tablet at 09/07/23 1117   nicotine  (NICODERM CQ  - dosed in mg/24 hours) patch 14 mg  14 mg Transdermal Daily Cindy Garnette POUR, MD   14 mg at 09/07/23 1118   pantoprazole  (PROTONIX ) EC tablet 40 mg  40 mg Oral BID Collier, Amanda R, PA-C   40 mg at 09/07/23 1117   polyethylene glycol (MIRALAX  / GLYCOLAX ) packet 17 g  17 g Oral Daily PRN Melvin, Alexander B, MD       propranolol  ER (INDERAL  LA) 24 hr capsule 120 mg  120 mg Oral Daily Melvin, Alexander B, MD   120 mg at 09/07/23 1118   rosuvastatin  (CRESTOR ) tablet 40 mg  40 mg Oral Daily Melvin, Alexander B, MD   40 mg at 09/07/23 1117   sodium chloride  flush (NS) 0.9 % injection 3 mL  3 mL Intravenous Q12H Seena Marsa NOVAK, MD   3 mL at 09/06/23 2142   sucralfate  (CARAFATE ) 1 GM/10ML suspension 1 g  1 g Oral TID WC & HS Mansouraty, Gabriel Jr., MD   1 g at 09/07/23 1117   thiamine  (VITAMIN B1) tablet 100 mg  100 mg Oral Daily Melvin, Alexander B, MD   100 mg at 09/07/23 1116   vitamin B-12 (CYANOCOBALAMIN ) tablet 500 mcg  500 mcg Oral Daily Sebastian Toribio GAILS, MD   500 mcg at 09/07/23 1120     LABORATORY DATA:  I have reviewed the data as listed Lab Results  Component Value Date   WBC 5.9 09/07/2023   HGB 14.1 09/07/2023   HCT 39.9 09/07/2023   MCV 105.6 (H) 09/07/2023   PLT 195 09/07/2023   Recent Labs  05/04/23 1046 06/13/23 1732 09/03/23 1949 09/04/23 0339 09/04/23 1531 09/06/23 0428 09/06/23 1901 09/07/23 0429  NA  --    < > 139 136   < > 137 138 138  K  --    < > 3.2* 3.0*   < > 3.4* 4.0 3.9  CL  --    < > 78* 83*   < > 99 105 106  CO2  --    < > 31 33*   < > 23 25 23   GLUCOSE  --    < > 125* 117*   < > 111* 149* 101*  BUN  --    < > 37* 39*   < > 24* 20 18  CREATININE  --    < > 3.43* 3.03*   < > 1.64* 1.56* 1.43*  CALCIUM   --    < > 10.1 9.2   < > 8.8* 9.2 8.9  GFRNONAA  --    < > 18* 20*   < > 43* 45* 50*  PROT 6.0   < > 7.5 6.1*  --  5.5*   --   --   ALBUMIN 3.7   < > 4.1 3.5  --  3.1*  --   --   AST 21   < > 28 22  --  18  --   --   ALT 11   < > 14 13  --  11  --   --   ALKPHOS 28*   < > 38 32*  --  28*  --   --   BILITOT 0.4   < > 2.0* 1.2  --  1.6*  --   --   BILIDIR 0.1  --   --   --   --   --   --   --    < > = values in this interval not displayed.    RADIOGRAPHIC STUDIES: I have personally reviewed the radiological images as listed and agreed with the findings in the report. US  Venous Img Lower Bilateral (DVT) Result Date: 08/28/2023 CLINICAL DATA:  Bilateral lower extremity edema.  Elevated D-dimer EXAM: Bilateral LOWER EXTREMITY VENOUS DOPPLER ULTRASOUND TECHNIQUE: Gray-scale sonography with compression, as well as color and duplex ultrasound, were performed to evaluate the deep venous system(s) from the level of the common femoral vein through the popliteal and proximal calf veins. COMPARISON:  Oral FINDINGS: VENOUS Normal compressibility of the common femoral, superficial femoral, and popliteal veins, as well as the visualized calf veins. Visualized portions of profunda femoral vein and great saphenous vein unremarkable. No filling defects to suggest DVT on grayscale or color Doppler imaging. Doppler waveforms show normal direction of venous flow, normal respiratory plasticity and response to augmentation. Limited views of the contralateral common femoral vein are unremarkable. OTHER None. Limitations: none IMPRESSION: No evidence of deep venous thrombosis. Electronically Signed   By: Jackquline Boxer M.D.   On: 08/28/2023 14:08     The total time spent in the appointment was 55 minutes encounter with patients including review of chart and various tests results, discussions about plan of care and coordination of care plan   All questions were answered. The patient knows to call the clinic with any problems, questions or concerns. No barriers to learning was detected.  Olam JINNY Brunner, NP 7/31/202511:28 AM   Attending  Note  I personally saw the patient, reviewed the chart and examined the patient. The plan of care was discussed with the  patient and the admitting team. I agree with the assessment and plan as documented above. Thank you very much for the consultation. Pancreatic Neuroendocrine cancer: Discussed with the patient and his wife that well-differentiated grade 1 pancreatic neuroendocrine carcinomas that are unresectable can be managed medically. Dr. Lanny agreed to follow the patient as an outpatient.  He will be set up in the next 1 to 2 weeks. Alcoholism: Patient tells me that he is no longer going to drink any alcohol. Prognosis: I discussed with the family that prognosis will depend on rate of progression which could be evaluated with scans over time as well as response to treatment and his tolerance to treatment.  It is also extremely dependent on him staying away from alcohol.

## 2023-09-07 NOTE — Progress Notes (Signed)
 Discharge medications delivered to patient at bedside [facility]

## 2023-09-07 NOTE — Plan of Care (Signed)
  Problem: Education: Goal: Knowledge of General Education information will improve Description: Including pain rating scale, medication(s)/side effects and non-pharmacologic comfort measures Outcome: Progressing   Problem: Clinical Measurements: Goal: Will remain free from infection Outcome: Progressing Goal: Diagnostic test results will improve Outcome: Progressing Goal: Respiratory complications will improve Outcome: Progressing Goal: Cardiovascular complication will be avoided Outcome: Progressing   Problem: Activity: Goal: Risk for activity intolerance will decrease Outcome: Progressing   Problem: Nutrition: Goal: Adequate nutrition will be maintained Outcome: Progressing   Problem: Coping: Goal: Level of anxiety will decrease Outcome: Progressing   Problem: Elimination: Goal: Will not experience complications related to bowel motility Outcome: Progressing

## 2023-09-07 NOTE — Plan of Care (Signed)
  Problem: Clinical Measurements: Goal: Diagnostic test results will improve Outcome: Progressing   Problem: Clinical Measurements: Goal: Respiratory complications will improve Outcome: Progressing   Problem: Clinical Measurements: Goal: Cardiovascular complication will be avoided Outcome: Progressing   Problem: Nutrition: Goal: Adequate nutrition will be maintained Outcome: Progressing   Problem: Coping: Goal: Level of anxiety will decrease Outcome: Progressing   

## 2023-09-07 NOTE — Progress Notes (Signed)
 Escorted patient and spouse to front entrance. Patient is alert and oriented, ambulating independently, and showing no signs of acute distress. Discharge medications and personal belongings forwarded with patient.

## 2023-09-07 NOTE — Discharge Summary (Signed)
 Physician Discharge Summary  Juan Torres FMW:[id number] DOB: 1945/11/19 DOA: 09/03/2023  PCP: Juan Debby CROME, MD  Admit date: 09/03/2023 Discharge date: 09/07/2023  Time spent: 60 minutes  Recommendations for Outpatient Follow-up:  Follow-up with Juan Debby CROME, MD in 1 to 2 weeks.  On follow-up patient will need a basic metabolic profile, magnesium  level done to follow-up on electrolytes and renal function.  Patient will need a CBC done to follow-up on counts. Follow-up with Juan Torres, hematology/oncology in 1 to 2 weeks. Follow-up with Juan Torres, general surgery in 2 to 3 weeks. Follow-up with Juan Torres, gastroenterology.  Office will call with appointment time.   Discharge Diagnoses:  Principal Problem:   ARF (acute renal failure) (HCC) Active Problems:   GERD (gastroesophageal reflux disease)   Hyperlipidemia, mixed   Essential hypertension   CKD (chronic kidney disease) stage 3, GFR 30-59 ml/min (HCC)   Emphysema lung (HCC)   OSA on CPAP   Mild dementia associated with alcoholism, with anxiety (HCC)   QT prolongation   Acute renal failure superimposed on stage 3a chronic kidney disease (HCC)   Pancreatic lesion   Abnormal MRI of abdomen   Bilious vomiting with nausea   Odynophagia   Acute esophagitis   Erosive esophagitis   Unintentional weight loss   High anion gap metabolic acidosis   Nausea vomiting and diarrhea   Primary pancreatic neuroendocrine tumor   Discharge Condition: Stable and improved.  Diet recommendation: Regular  Filed Weights   09/03/23 1342 09/03/23 1931  Weight: 68 kg 68 kg    History of present illness:  HPI per Juan Torres is a 78 y.o. male with medical history significant of hypertension, hyperlipidemia, GERD, CKD 3, QT prolongation, mild dementia, OSA on CPAP, COPD, prostate cancer, tremor presenting with nausea vomiting and diarrhea.   Patient was recently admitted for alcoholic pancreatitis and pancreatic mass  was discovered at that time.  Has been seen by [facility] outpatient with plan for Thornton Torres provider to do pancreatic biopsy tomorrow.   No alcohol use for the past month until 3 days ago with anxiety increasing about mass.  Began to have significant nausea vomiting diarrhea yesterday and continued symptoms today.  No abdominal pain but does report some chills.   Denies fevers, chest pain, shortness of breath.   ED Course: Vital signs in the ED notable for blood pressure 147 150 systolic.   Lab workup included CMP with testing 3.4, chloride 78, BUN 34, creatinine 3.32 from baseline 1.3, normal bicarb at gap 36, glucose 179, calcium  10.7, protein 8.2, T. bili 2.5.  CBC with leukocytosis 13.9 and hemoglobin 17.1 likely hemoconcentration component.  Lipase normal.  Urinalysis pending.  No imaging.   Patient received Reglan  and 1 L IV fluids.  Started on a rate of fluids.  Hospital Course:  #1 AKI on CKD stage IIIb/elevated anion gap -Secondary to prerenal azotemia in the setting of nausea vomiting diarrhea - Patient noted on admission to have a creatinine up to 3.32 with baseline creatinine of 1.3. - Anion gap elevated to 36 with normal bicarb felt likely compensated changes of mixed acidosis/alkalosis and some chloride loss from Torres symptoms. - Patient hydrated with IV fluids, good urine output.   - Renal function improved daily such that by day of discharge creatinine was down to 1.43 from 3.32 on admission.   - Outpatient follow-up with PCP.     2.  Nausea vomiting diarrhea - Improved clinically during the  hospitalization.  C - May be secondary to neuroendocrine tumor noted on biopsy per Torres. -Patient maintained on the supportive care during the hospitalization and improved clinically/send by day of discharge nausea vomiting and diarrhea had resolved. -Outpatient follow-up.   3.  Pancreatic mass/LA grade 4 esophagitis, gastritis, duodenitis and duodenal ulcers/neuroendocrine tumor -Patient with  recent pancreatitis believed to be alcohol induced and pancreatic mass noted at that time. - Patient seen in consultation by Torres. - Patient underwent EUS by Juan Torres on 09/03/2023 with findings of erosive esophagitis grade D with bleeding in the middle and distal esophagus which was biopsied, gastritis, duodenitis and duodenal ulcers with hemorrhage noted in the proximal duodenum, congested major papula.  EUS with mass identified in the pancreatic head with endoscopic appearance suspicious for neuroendocrine tumor, fine-needle biopsy performed, hyperechoic material consistent with sludge visualized endosonographically in the gallbladder, no malignant appearing lymph nodes visualized in the celiac region, peripancreatic region and porta hepatis region. - Torres recommended PPI twice daily, Carafate  1 g 4 times daily daily for 1 month then twice daily. - Per Torres biopsy consistent with neuroendocrine tumor. -General surgery informed of biopsy findings per Torres and patient will follow-up in the outpatient setting.. -CT chest was done for staging which was negative for metastatic disease. - Patient seen in consultation by oncology, Juan Torres who discussed with patient and his wife about neuroendocrine tumor noted and felt those that are unresectable could be managed medically. - Patient will follow-up with Juan Torres, hematology/oncology in 1 to 2 weeks for further management. - Patient also follow-up with general surgery, Juan Torres.. - Patient will be discharged in stable and improved condition.   4.  Hypertension -Not on any antihypertensive medications except propranolol  which he takes for tremors. -Outpatient follow-up with PCP.   5.  Hyperlipidemia - Patient maintained on home regimen Zetia , fenofibrate , rosuvastatin .   6.  GERD - Patient maintained on PPI.   7.  Hypomagnesemia/hypokalemia -Secondary to Torres losses - Repleted during the hospitalization.     8. 7-8 beat NSVT - Was called  by RN that patient with a asymptomatic 7-8 beat run of NSVT however remained asymptomatic. - BMET and magnesium  ordered with potassium currently at 4.0 and magnesium  at 1.6. - Electrolytes repleted during the hospitalization. - Patient maintained on home regimen of propranolol . - Patient remained asymptomatic and had no further episodes of NSVT.   9.  Mild dementia   10.  OSA -CPAP nightly.   11.  COPD -Stable. - Not on any inhalers.   12.  History of prostate cancer   13.  Tremors - Patient maintained on home regimen propranolol .   14.  History of alcohol use -Patient noted with recent pancreatitis. - Patient noted to have abstained from alcohol for the past month until a few days ago prior to admission. - Patient with no signs of withdrawal. - Patient was maintained on thiamine , folate, multivitamin.   - Outpatient follow-up with PCP.      Procedures: Upper EUS per Torres: Juan Torres 09/04/2023   Consultations: Gastroenterology: Juan Torres 09/04/2023  Hematology/oncology: Juan Torres 09/07/2023  Discharge Exam: Vitals:   09/07/23 0312 09/07/23 1302  BP: (!) 149/79 (!) 162/87  Pulse: (!) 48 (!) 50  Resp: 18 17  Temp: 97.6 F (36.4 C) 97.8 F (36.6 C)  SpO2: 98% 100%    General: NAD Cardiovascular: RRR no murmurs rubs or gallops.  No JVD.  No lower extremity edema. Respiratory: Clear to auscultation bilaterally.  No wheezes, no crackles, no rhonchi.  Fair air movement.  Speaking in full sentences.  Discharge Instructions   Discharge Instructions     Diet general   Complete by: As directed    Increase activity slowly   Complete by: As directed       Allergies as of 09/07/2023       Reactions   Lipitor [atorvastatin] Other (See Comments)   fatigued   Niacin And Related Other (See Comments)   Flushing   Penicillins Rash        Medication List     STOP taking these medications    omeprazole  40 MG capsule Commonly known as: PRILOSEC Replaced  by: pantoprazole  40 MG tablet       TAKE these medications    acetaminophen  325 MG tablet Commonly known as: TYLENOL  Take 2 tablets (650 mg total) by mouth every 6 (six) hours as needed for mild pain (pain score 1-3) or fever (or Fever >/= 101).   cyanocobalamin  500 MCG tablet Commonly known as: VITAMIN B12 Take 1 tablet (500 mcg total) by mouth daily.   ezetimibe  10 MG tablet Commonly known as: ZETIA  Take 1 tablet (10 mg total) by mouth daily for cholesterol.   fenofibrate  micronized 134 MG capsule Commonly known as: LOFIBRA Take 1 capsule (134 mg total) by mouth daily (blood fats).   Fish Oil 1000 MG Caps Take 1,000 mg by mouth every morning.   folic acid  1 MG tablet Commonly known as: FOLVITE  Take 1 tablet (1 mg total) by mouth daily.   loperamide  2 MG tablet Commonly known as: Imodium  A-D Take 1 tablet (2 mg total) by mouth 4 (four) times daily as needed for diarrhea or loose stools.   magnesium  oxide 400 (240 Mg) MG tablet Commonly known as: MAG-OX Take 1 tablet (400 mg total) by mouth every evening.   multivitamin with minerals Tabs tablet Take 1 tablet by mouth daily.   nicotine  14 mg/24hr patch Commonly known as: NICODERM CQ  - dosed in mg/24 hours Place 1 patch (14 mg total) onto the skin daily.   ondansetron  4 MG disintegrating tablet Commonly known as: ZOFRAN -ODT Take 1 tablet (4 mg total) by mouth every 8 (eight) hours as needed for nausea or vomiting.   pantoprazole  40 MG tablet Commonly known as: PROTONIX  Take 1 tablet (40 mg total) by mouth 2 (two) times daily. Replaces: omeprazole  40 MG capsule   propranolol  ER 120 MG 24 hr capsule Commonly known as: INDERAL  LA Take 1 capsule (120 mg total) by mouth daily. For blood pressure and tremor   rosuvastatin  40 MG tablet Commonly known as: CRESTOR  Take 1 tablet (40 mg total) by mouth daily for cholesterol   sucralfate  1 GM/10ML suspension Commonly known as: CARAFATE  Take 10 mLs (1 g total) by  mouth 4 (four) times daily for 30 days, THEN 10 mLs (1 g total) 2 (two) times daily. Start taking on: September 07, 2023 What changed: See the new instructions.   thiamine  100 MG tablet Commonly known as: Vitamin B-1 Take 1 tablet (100 mg total) by mouth daily.   Vitamin D3 125 MCG (5000 UT) Tabs Take 10,000 Units by mouth every morning.       Allergies  Allergen Reactions   Lipitor [Atorvastatin] Other (See Comments)    fatigued   Niacin And Related Other (See Comments)    Flushing   Penicillins Rash    Follow-up Information     Juan Debby CROME, MD. Schedule an appointment  as soon as possible for a visit in 2 week(s).   Specialty: Internal Medicine Why: Follow-up in 1 to 2 weeks. Contact information: [address] [place] [address] [phone]         Mansouraty, Aloha Raddle., MD Follow up.   Specialties: Gastroenterology, Internal Medicine Why: Office will call with appointment time. Contact information: [address] [place] [address] [phone]         Torres Callander, MD. Schedule an appointment as soon as possible for a visit in 2 week(s).   Specialties: Hematology, Oncology Why: Follow-up in 1 to 2 weeks.  Office will call with appointment time. Contact information: [address] [place] [address] [phone]         Dasie Leonor CROME, MD. Schedule an appointment as soon as possible for a visit in 2 week(s).   Specialty: General Surgery Why: Follow-up in 2 to 3 weeks. Contact information: [address] [place] [address] [phone]                  The results of significant diagnostics from this hospitalization (including imaging, microbiology, ancillary and laboratory) are listed below for reference.    Significant Diagnostic Studies: CT CHEST WO CONTRAST Result Date: 09/07/2023 CLINICAL DATA:  Pancreatic neuroendocrine neoplasm staging workup * Tracking Code: BO * EXAM: CT CHEST WITHOUT CONTRAST  TECHNIQUE: Multidetector CT imaging of the chest was performed following the standard protocol without IV contrast. RADIATION DOSE REDUCTION: This exam was performed according to the departmental dose-optimization program which includes automated exposure control, adjustment of the mA and/or kV according to patient size and/or use of iterative reconstruction technique. COMPARISON:  12/30/2022 FINDINGS: Cardiovascular: Coronary, aortic arch, and branch vessel atherosclerotic vascular disease. Mediastinum/Nodes: Prevascular node 6 mm in short axis on image 105 series 8, stable. No overt pathologic adenopathy. Lungs/Pleura: Mild biapical pleuroparenchymal scarring. 4 by 3 mm right middle lobe nodule on image 97 series 6, no change from 12/10/2018, considered benign. Trace right pleural effusion on image 154 series 2. Upper Abdomen: Known pancreatic head mass with punctate central calcification. Abdominal aortic atherosclerosis. Benign hepatic cysts. Musculoskeletal: Suspected sebaceous cyst along the right axilla similar to previous although possibly with some mild cutaneous irregularity. IMPRESSION: 1. No findings of metastatic disease to the chest. 2. Trace right pleural effusion. 3. Known pancreatic head mass. 4. Suspected sebaceous cyst along the right axilla similar to previous although possibly with some mild cutaneous irregularity. 5.  Aortic Atherosclerosis (ICD10-I70.0). Electronically Signed   By: Ryan Salvage M.D.   On: 09/07/2023 12:20   US  Venous Img Lower Bilateral (DVT) Result Date: 08/28/2023 CLINICAL DATA:  Bilateral lower extremity edema.  Elevated D-dimer EXAM: Bilateral LOWER EXTREMITY VENOUS DOPPLER ULTRASOUND TECHNIQUE: Gray-scale sonography with compression, as well as color and duplex ultrasound, were performed to evaluate the deep venous system(s) from the level of the common femoral vein through the popliteal and proximal calf veins. COMPARISON:  Oral FINDINGS: VENOUS Normal  compressibility of the common femoral, superficial femoral, and popliteal veins, as well as the visualized calf veins. Visualized portions of profunda femoral vein and great saphenous vein unremarkable. No filling defects to suggest DVT on grayscale or color Doppler imaging. Doppler waveforms show normal direction of venous flow, normal respiratory plasticity and response to augmentation. Limited views of the contralateral common femoral vein are unremarkable. OTHER None. Limitations: none IMPRESSION: No evidence of deep venous thrombosis. Electronically Signed   By: Jackquline Malva HERO.D.  On: 08/28/2023 14:08    Microbiology: No results found for this or any previous visit (from the past 240 hours).   Labs: Basic Metabolic Panel: Recent Labs  Lab 09/03/23 1949 09/04/23 0339 09/04/23 1531 09/05/23 1403 09/06/23 0428 09/06/23 1901 09/07/23 0429  NA 139   < > 140 136 137 138 138  K 3.2*   < > 3.5 2.9* 3.4* 4.0 3.9  CL 78*   < > 91* 94* 99 105 106  CO2 31   < > 35* 29 23 25 23   GLUCOSE 125*   < > 115* 147* 111* 149* 101*  BUN 37*   < > 37* 27* 24* 20 18  CREATININE 3.43*   < > 2.88* 2.02* 1.64* 1.56* 1.43*  CALCIUM  10.1   < > 9.4 9.2 8.8* 9.2 8.9  MG 1.8  --   --   --   --  1.6* 2.7*   < > = values in this interval not displayed.   Liver Function Tests: Recent Labs  Lab 09/03/23 1406 09/03/23 1949 09/04/23 0339 09/06/23 0428  AST 22 28 22 18   ALT 14 14 13 11   ALKPHOS 44 38 32* 28*  BILITOT 2.5* 2.0* 1.2 1.6*  PROT 8.2* 7.5 6.1* 5.5*  ALBUMIN 4.7 4.1 3.5 3.1*   Recent Labs  Lab 09/03/23 1406  LIPASE 37   No results for input(s): AMMONIA in the last 168 hours. CBC: Recent Labs  Lab 09/03/23 1406 09/04/23 0339 09/06/23 0428 09/07/23 0429  WBC 13.9* 12.1* 5.7 5.9  NEUTROABS 11.4*  --   --  3.6  HGB 17.1* 13.9 13.7 14.1  HCT 48.6 40.4 38.4* 39.9  MCV 102.5* 104.1* 103.5* 105.6*  PLT 282 206 183 195   Cardiac Enzymes: No results for input(s): CKTOTAL,  CKMB, CKMBINDEX, TROPONINI in the last 168 hours. BNP: BNP (last 3 results) No results for input(s): BNP in the last 8760 hours.  ProBNP (last 3 results) No results for input(s): PROBNP in the last 8760 hours.  CBG: No results for input(s): GLUCAP in the last 168 hours.     Signed:  Toribio Hummer MD.  [facility] 09/07/2023, 3:57 PM

## 2023-09-08 ENCOUNTER — Other Ambulatory Visit (HOSPITAL_COMMUNITY): Payer: Self-pay

## 2023-09-08 ENCOUNTER — Telehealth: Payer: Self-pay

## 2023-09-08 LAB — CANCER ANTIGEN 19-9: CA 19-9: 43 U/mL — ABNORMAL HIGH (ref 0–35)

## 2023-09-08 LAB — CEA: CEA: 1.5 ng/mL (ref 0.0–4.7)

## 2023-09-08 NOTE — Transitions of Care (Post Inpatient/ED Visit) (Signed)
   09/08/2023  Name: Juan Torres MRN: [id number] DOB: [birth year]  Today's TOC FU Call Status: Today's TOC FU Call Status:: Unsuccessful Call (1st Attempt) Unsuccessful Call (1st Attempt) Date: 09/08/23  Attempted to reach the patient regarding the most recent Inpatient/ED visit.  Follow Up Plan: Additional outreach attempts will be made to reach the patient to complete the Transitions of Care (Post Inpatient/ED visit) call.   Signature Julian Lemmings, LPN [facility] Nurse Health Advisor Direct Dial [phone]

## 2023-09-11 ENCOUNTER — Other Ambulatory Visit: Payer: Self-pay

## 2023-09-11 DIAGNOSIS — K869 Disease of pancreas, unspecified: Secondary | ICD-10-CM

## 2023-09-11 NOTE — Transitions of Care (Post Inpatient/ED Visit) (Signed)
 09/11/2023  Name: Juan Torres MRN: [id number] DOB: 09/07/1945  Today's TOC FU Call Status: Today's TOC FU Call Status:: Successful TOC FU Call Completed Unsuccessful Call (1st Attempt) Date: 09/08/23 [facility] FU Call Complete Date: 09/11/23 Patient's Name and Date of Birth confirmed.  Transition Care Management Follow-up Telephone Call Date of Discharge: 09/07/23 Discharge Facility: [facility]) Type of Discharge: Inpatient Admission Primary Inpatient Discharge Diagnosis:: diarrhea How have you been since you were released from the hospital?: Better Any questions or concerns?: No  Items Reviewed: Did you receive and understand the discharge instructions provided?: Yes Medications obtained,verified, and reconciled?: Yes (Medications Reviewed) Any new allergies since your discharge?: No Dietary orders reviewed?: Yes Do you have support at home?: Yes Name of Support/Comfort Primary Source: nurse  Medications Reviewed Today: Medications Reviewed Today     Reviewed by Emmitt Pan, LPN (Licensed Practical Nurse) on 09/11/23 at 1210  Med List Status: <None>   Medication Order Taking? Sig Documenting Provider Last Dose Status Informant  acetaminophen  (TYLENOL ) 325 MG tablet [id number] Yes Take 2 tablets (650 mg total) by mouth every 6 (six) hours as needed for mild pain (pain score 1-3) or fever (or Fever >/= 101). Sebastian Toribio GAILS, MD  Active   Cholecalciferol  (VITAMIN D3) 125 MCG (5000 UT) TABS [id number] Yes Take 10,000 Units by mouth every morning. [provider]  Active Pharmacy Records, Self  cyanocobalamin  (VITAMIN B12) 500 MCG tablet [id number] Yes Take 1 tablet (500 mcg total) by mouth daily. Arlon Carliss ORN, DO  Active Self, Pharmacy Records  ezetimibe  (ZETIA ) 10 MG tablet [id number] Yes Take 1 tablet (10 mg total) by mouth daily for cholesterol. Wilkinson, Dana E, NP  Active Pharmacy Records, Self  fenofibrate  micronized [facility]) 134 MG capsule [id number] Yes  Take 1 capsule (134 mg total) by mouth daily (blood fats). Wilkinson, Dana E, NP  Active Pharmacy Records, Self  folic acid  (FOLVITE ) 1 MG tablet [id number] Yes Take 1 tablet (1 mg total) by mouth daily. Arlon Carliss ORN, DO  Active Self, Pharmacy Records  loperamide  (IMODIUM  A-D) 2 MG tablet [id number] Yes Take 1 tablet (2 mg total) by mouth 4 (four) times daily as needed for diarrhea or loose stools. Sebastian Toribio GAILS, MD  Active   magnesium  oxide (MAG-OX) 400 (240 Mg) MG tablet [id number] Yes Take 1 tablet (400 mg total) by mouth every evening. Joshua Debby CROME, MD  Active Pharmacy Records, Self  Multiple Vitamin (MULTIVITAMIN WITH MINERALS) TABS tablet [id number] Yes Take 1 tablet by mouth daily. Arlon Carliss ORN, DO  Active Self, Pharmacy Records  nicotine  (NICODERM CQ  - DOSED IN MG/24 HOURS) 14 mg/24hr patch [id number] Yes Place 1 patch (14 mg total) onto the skin daily. Arlon Carliss ORN, DO  Active Self, Pharmacy Records  Omega-3 Fatty Acids (FISH OIL) 1000 MG CAPS [id number] Yes Take 1,000 mg by mouth every morning. [provider]  Active Pharmacy Records, Self  ondansetron  (ZOFRAN -ODT) 4 MG disintegrating tablet [id number] Yes Take 1 tablet (4 mg total) by mouth every 8 (eight) hours as needed for nausea or vomiting. Sebastian Toribio GAILS, MD  Active   pantoprazole  (PROTONIX ) 40 MG tablet [id number] Yes Take 1 tablet (40 mg total) by mouth 2 (two) times daily. Sebastian Toribio GAILS, MD  Active   propranolol  ER (INDERAL  LA) 120 MG 24 hr capsule [id number] Yes Take 1 capsule (120 mg total) by mouth daily. For blood pressure and tremor Joshua Debby CROME, MD  Active Pharmacy Records, Self  rosuvastatin  (CRESTOR )  40 MG tablet [id number] Yes Take 1 tablet (40 mg total) by mouth daily for cholesterol Joshua Debby CROME, MD  Active Pharmacy Records, Self  sucralfate  (CARAFATE ) 1 GM/10ML suspension [id number] Yes Take 10 mLs (1 g total) by mouth 4 (four) times daily for 30 days, THEN 10 mLs (1 g total) 2 (two) times  daily. Sebastian Toribio GAILS, MD  Active   thiamine  (VITAMIN B-1) 100 MG tablet [id number] Yes Take 1 tablet (100 mg total) by mouth daily. Arlon Carliss ORN, DO  Active Self, Pharmacy Records            Home Care and Equipment/Supplies: Were Home Health Services Ordered?: NA Any new equipment or medical supplies ordered?: NA  Functional Questionnaire: Do you need assistance with bathing/showering or dressing?: No Do you need assistance with meal preparation?: No Do you need assistance with eating?: No Do you have difficulty maintaining continence: No Do you need assistance with getting out of bed/getting out of a chair/moving?: No Do you have difficulty managing or taking your medications?: No  Follow up appointments reviewed: PCP Follow-up appointment confirmed?: Yes Date of PCP follow-up appointment?: 09/14/23 Follow-up Provider: [facility] Follow-up appointment confirmed?: NA Do you need transportation to your follow-up appointment?: No Do you understand care options if your condition(s) worsen?: Yes-patient verbalized understanding    SIGNATURE Julian Lemmings, LPN [facility] Nurse Health Advisor Direct Dial [phone]

## 2023-09-14 ENCOUNTER — Encounter: Payer: Self-pay | Admitting: Internal Medicine

## 2023-09-14 ENCOUNTER — Ambulatory Visit: Admitting: Internal Medicine

## 2023-09-14 VITALS — BP 151/79 | HR 55 | Temp 97.4°F | Ht 72.0 in | Wt 151.0 lb

## 2023-09-14 DIAGNOSIS — K2210 Ulcer of esophagus without bleeding: Secondary | ICD-10-CM

## 2023-09-14 DIAGNOSIS — N1832 Chronic kidney disease, stage 3b: Secondary | ICD-10-CM

## 2023-09-14 DIAGNOSIS — N179 Acute kidney failure, unspecified: Secondary | ICD-10-CM

## 2023-09-14 DIAGNOSIS — F1020 Alcohol dependence, uncomplicated: Secondary | ICD-10-CM

## 2023-09-14 DIAGNOSIS — F1027 Alcohol dependence with alcohol-induced persisting dementia: Secondary | ICD-10-CM

## 2023-09-14 DIAGNOSIS — N1831 Chronic kidney disease, stage 3a: Secondary | ICD-10-CM

## 2023-09-14 DIAGNOSIS — C7A8 Other malignant neuroendocrine tumors: Secondary | ICD-10-CM | POA: Diagnosis not present

## 2023-09-14 NOTE — Assessment & Plan Note (Signed)
 Acute on chronic Not drinking now Check CMET

## 2023-09-14 NOTE — Assessment & Plan Note (Signed)
 Acute on chronic renal failure due to dehydration.  He was hydrated intravenously. Obtain c-Met

## 2023-09-14 NOTE — Assessment & Plan Note (Addendum)
 S/p bx of the pancreatic mass Pt was seen by Dr. Odean while inpatient  about neuroendocrine tumor. He will see Dr Lanny and Dr Dasie

## 2023-09-14 NOTE — Progress Notes (Signed)
 Subjective:  Patient ID: Juan Torres, male    DOB: [birth year]  Age: 78 y.o. MRN: [id number]  CC: Hospitalization Follow-up   HPI Juan Torres presents for post- hospital f/u for ARI/CRI, n/v, gastritis He started drinking vodka a fifth/d prior to this hospital stay and developed nausea vomiting dehydration. He was found to have a pancreatic tumor and underwent a biopsy. He is here with his wife Elvie who is a retired [employer] fundraiser  Per hx: Admit date: 09/03/2023 Discharge date: 09/07/2023   Time spent: 60 minutes   Recommendations for Outpatient Follow-up:  Follow-up with Joshua Debby CROME, MD in 1 to 2 weeks.  On follow-up patient will need a basic metabolic profile, magnesium  level done to follow-up on electrolytes and renal function.  Patient will need a CBC done to follow-up on counts. Follow-up with Dr. Lanny, hematology/oncology in 1 to 2 weeks. Follow-up with Dr. Leonor Dawn, general surgery in 2 to 3 weeks. Follow-up with Dr. Wilhelmenia, gastroenterology.  Office will call with appointment time.     Discharge Diagnoses:  Principal Problem:   ARF (acute renal failure) (HCC) Active Problems:   GERD (gastroesophageal reflux disease)   Hyperlipidemia, mixed   Essential hypertension   CKD (chronic kidney disease) stage 3, GFR 30-59 ml/min (HCC)   Emphysema lung (HCC)   OSA on CPAP   Mild dementia associated with alcoholism, with anxiety (HCC)   QT prolongation   Acute renal failure superimposed on stage 3a chronic kidney disease (HCC)   Pancreatic lesion   Abnormal MRI of abdomen   Bilious vomiting with nausea   Odynophagia   Acute esophagitis   Erosive esophagitis   Unintentional weight loss   High anion gap metabolic acidosis   Nausea vomiting and diarrhea   Primary pancreatic neuroendocrine tumor     Discharge Condition: Stable and improved.   Diet recommendation: Regular       Filed Weights    09/03/23 1342 09/03/23 1931  Weight: 68 kg 68 kg      History of  present illness:  HPI per Dr. Seena Ubaldo Juan Torres is a 78 y.o. male with medical history significant of hypertension, hyperlipidemia, GERD, CKD 3, QT prolongation, mild dementia, OSA on CPAP, COPD, prostate cancer, tremor presenting with nausea vomiting and diarrhea.   Patient was recently admitted for alcoholic pancreatitis and pancreatic mass was discovered at that time.  Has been seen by [facility] outpatient with plan for [facility] provider to do pancreatic biopsy tomorrow.   No alcohol use for the past month until 3 days ago with anxiety increasing about mass.  Began to have significant nausea vomiting diarrhea yesterday and continued symptoms today.  No abdominal pain but does report some chills.   Denies fevers, chest pain, shortness of breath.   ED Course: Vital signs in the ED notable for blood pressure 147 150 systolic.   Lab workup included CMP with testing 3.4, chloride 78, BUN 34, creatinine 3.32 from baseline 1.3, normal bicarb at gap 36, glucose 179, calcium  10.7, protein 8.2, T. bili 2.5.  CBC with leukocytosis 13.9 and hemoglobin 17.1 likely hemoconcentration component.  Lipase normal.  Urinalysis pending.  No imaging.   Patient received Reglan  and 1 L IV fluids.  Started on a rate of fluids.   Hospital Course:  #1 AKI on CKD stage IIIb/elevated anion gap -Secondary to prerenal azotemia in the setting of nausea vomiting diarrhea - Patient noted on admission to have a creatinine up to 3.32 with  baseline creatinine of 1.3. - Anion gap elevated to 36 with normal bicarb felt likely compensated changes of mixed acidosis/alkalosis and some chloride loss from GI symptoms. - Patient hydrated with IV fluids, good urine output.   - Renal function improved daily such that by day of discharge creatinine was down to 1.43 from 3.32 on admission.   - Outpatient follow-up with PCP.     2.  Nausea vomiting diarrhea - Improved clinically during the hospitalization.  C - May be secondary to  neuroendocrine tumor noted on biopsy per GI. -Patient maintained on the supportive care during the hospitalization and improved clinically/send by day of discharge nausea vomiting and diarrhea had resolved. -Outpatient follow-up.   3.  Pancreatic mass/LA grade 4 esophagitis, gastritis, duodenitis and duodenal ulcers/neuroendocrine tumor -Patient with recent pancreatitis believed to be alcohol induced and pancreatic mass noted at that time. - Patient seen in consultation by GI. - Patient underwent EUS by Dr. Wilhelmenia on 09/03/2023 with findings of erosive esophagitis grade D with bleeding in the middle and distal esophagus which was biopsied, gastritis, duodenitis and duodenal ulcers with hemorrhage noted in the proximal duodenum, congested major papula.  EUS with mass identified in the pancreatic head with endoscopic appearance suspicious for neuroendocrine tumor, fine-needle biopsy performed, hyperechoic material consistent with sludge visualized endosonographically in the gallbladder, no malignant appearing lymph nodes visualized in the celiac region, peripancreatic region and porta hepatis region. - GI recommended PPI twice daily, Carafate  1 g 4 times daily daily for 1 month then twice daily. 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 -General surgery informed of biopsy findings per GI and patient will follow-up in the outpatient setting.. -CT chest was done for staging which was negative for metastatic disease. - Patient seen in consultation by oncology, Dr. Odean who discussed with patient and his wife about neuroendocrine tumor noted and felt those that are unresectable could be managed medically. - Patient will follow-up with Dr. Lanny, hematology/oncology in 1 to 2 weeks for further management. - Patient also follow-up with general surgery, Dr. Leonor Dawn.. - Patient will be discharged in stable and improved condition.   4.  Hypertension -Not on any antihypertensive  medications except propranolol  which he takes for tremors. -Outpatient follow-up with PCP.   5.  Hyperlipidemia - Patient maintained on home regimen Zetia , fenofibrate , rosuvastatin .   6.  GERD - Patient maintained on PPI.   7.  Hypomagnesemia/hypokalemia -Secondary to GI losses - Repleted during the hospitalization.     8. 7-8 beat NSVT - Was called by RN that patient with a asymptomatic 7-8 beat run of NSVT however remained asymptomatic. - BMET and magnesium  ordered with potassium currently at 4.0 and magnesium  at 1.6. - Electrolytes repleted during the hospitalization. - Patient maintained on home regimen of propranolol . - Patient remained asymptomatic and had no further episodes of NSVT.   9.  Mild dementia   10.  OSA -CPAP nightly.   11.  COPD -Stable. - Not on any inhalers.   12.  History of prostate cancer   13.  Tremors - Patient maintained on home regimen propranolol .   14.  History of alcohol use -Patient noted with recent pancreatitis. - Patient noted to have abstained from alcohol for the past month until a few days ago prior to admission. - Patient with no signs of withdrawal. - Patient was maintained on thiamine , folate, multivitamin.   - Outpatient follow-up with PCP.       Procedures: Upper EUS per GI:  Dr. Wilhelmenia 09/04/2023    Consultations: Gastroenterology: Dr. Wilhelmenia 09/04/2023  Hematology/oncology: Dr. Gudena 09/07/2023    Outpatient Medications Prior to Visit  Medication Sig Dispense Refill   acetaminophen  (TYLENOL ) 325 MG tablet Take 2 tablets (650 mg total) by mouth every 6 (six) hours as needed for mild pain (pain score 1-3) or fever (or Fever >/= 101).     Cholecalciferol  (VITAMIN D3) 125 MCG (5000 UT) TABS Take 10,000 Units by mouth every morning.     cyanocobalamin  (VITAMIN B12) 500 MCG tablet Take 1 tablet (500 mcg total) by mouth daily. 30 tablet 0   ezetimibe  (ZETIA ) 10 MG tablet Take 1 tablet (10 mg total) by mouth daily for  cholesterol. 90 tablet 3   fenofibrate  micronized (LOFIBRA) 134 MG capsule Take 1 capsule (134 mg total) by mouth daily (blood fats). 90 capsule 3   folic acid  (FOLVITE ) 1 MG tablet Take 1 tablet (1 mg total) by mouth daily. 30 tablet 0   loperamide  (IMODIUM  A-D) 2 MG tablet Take 1 tablet (2 mg total) by mouth 4 (four) times daily as needed for diarrhea or loose stools.     magnesium  oxide (MAG-OX) 400 (240 Mg) MG tablet Take 1 tablet (400 mg total) by mouth every evening. 120 tablet 0   Multiple Vitamin (MULTIVITAMIN WITH MINERALS) TABS tablet Take 1 tablet by mouth daily. 30 tablet 0   nicotine  (NICODERM CQ  - DOSED IN MG/24 HOURS) 14 mg/24hr patch Place 1 patch (14 mg total) onto the skin daily. 28 patch 0   Omega-3 Fatty Acids (FISH OIL) 1000 MG CAPS Take 1,000 mg by mouth every morning.     ondansetron  (ZOFRAN -ODT) 4 MG disintegrating tablet Take 1 tablet (4 mg total) by mouth every 8 (eight) hours as needed for nausea or vomiting. 20 tablet 0   pantoprazole  (PROTONIX ) 40 MG tablet Take 1 tablet (40 mg total) by mouth 2 (two) times daily. 180 tablet 0   propranolol  ER (INDERAL  LA) 120 MG 24 hr capsule Take 1 capsule (120 mg total) by mouth daily. For blood pressure and tremor 90 capsule 1   rosuvastatin  (CRESTOR ) 40 MG tablet Take 1 tablet (40 mg total) by mouth daily for cholesterol 90 tablet 0   sucralfate  (CARAFATE ) 1 GM/10ML suspension Take 10 mLs (1 g total) by mouth 4 (four) times daily for 30 days, THEN 10 mLs (1 g total) 2 (two) times daily. 420 mL 0   thiamine  (VITAMIN B-1) 100 MG tablet Take 1 tablet (100 mg total) by mouth daily. 30 tablet 0   No facility-administered medications prior to visit.    ROS: Review of Systems  Constitutional:  Negative for appetite change, fatigue and unexpected weight change.  HENT:  Positive for hearing loss. Negative for congestion, nosebleeds, sneezing, sore throat and trouble swallowing.   Eyes:  Negative for itching and visual disturbance.   Respiratory:  Negative for cough.   Cardiovascular:  Negative for chest pain, palpitations and leg swelling.  Gastrointestinal:  Negative for abdominal distention, blood in stool, diarrhea, nausea and vomiting.  Genitourinary:  Negative for frequency and hematuria.  Musculoskeletal:  Positive for arthralgias. Negative for back pain, gait problem, joint swelling and neck pain.  Skin:  Negative for rash.  Neurological:  Negative for dizziness, tremors, speech difficulty and weakness.  Hematological:  Bruises/bleeds easily.  Psychiatric/Behavioral:  Positive for decreased concentration. Negative for agitation, dysphoric mood, sleep disturbance and suicidal ideas. The patient is not nervous/anxious.     Objective:  BP ROLLEN)  151/79   Pulse (!) 55   Temp (!) 97.4 F (36.3 C) (Oral)   Ht 6' (1.829 m)   Wt 151 lb (68.5 kg)   SpO2 100%   BMI 20.48 kg/m   BP Readings from Last 3 Encounters:  09/14/23 (!) 151/79  09/07/23 (!) 162/87  08/24/23 136/82    Wt Readings from Last 3 Encounters:  09/14/23 151 lb (68.5 kg)  09/03/23 149 lb 14.6 oz (68 kg)  08/24/23 150 lb 6.4 oz (68.2 kg)    Physical Exam Constitutional:      General: He is not in acute distress.    Appearance: Normal appearance. He is well-developed.     Comments: NAD  Eyes:     Conjunctiva/sclera: Conjunctivae normal.     Pupils: Pupils are equal, round, and reactive to light.  Neck:     Thyroid : No thyromegaly.     Vascular: No JVD.  Cardiovascular:     Rate and Rhythm: Normal rate and regular rhythm.     Heart sounds: Normal heart sounds. No murmur heard.    No friction rub. No gallop.  Pulmonary:     Effort: Pulmonary effort is normal. No respiratory distress.     Breath sounds: Normal breath sounds. No wheezing or rales.  Chest:     Chest wall: No tenderness.  Abdominal:     General: Bowel sounds are normal. There is no distension.     Palpations: Abdomen is soft. There is no mass.     Tenderness: There is  no abdominal tenderness. There is no guarding or rebound.  Musculoskeletal:        General: No tenderness. Normal range of motion.     Cervical back: Normal range of motion.     Right lower leg: No edema.     Left lower leg: No edema.  Lymphadenopathy:     Cervical: No cervical adenopathy.  Skin:    General: Skin is warm and dry.     Findings: No rash.  Neurological:     Mental Status: He is alert. Mental status is at baseline.     Cranial Nerves: No cranial nerve deficit.     Motor: No abnormal muscle tone.     Coordination: Coordination normal.     Gait: Gait normal.     Deep Tendon Reflexes: Reflexes are normal and symmetric.  Psychiatric:        Behavior: Behavior normal.        Thought Content: Thought content normal.        Judgment: Judgment normal.   He is hard hearing  Lab Results  Component Value Date   WBC 5.9 09/07/2023   HGB 14.1 09/07/2023   HCT 39.9 09/07/2023   PLT 195 09/07/2023   GLUCOSE 101 (H) 09/07/2023   CHOL 138 12/01/2022   TRIG 113 12/01/2022   HDL 56 12/01/2022   LDLCALC 62 12/01/2022   ALT 11 09/06/2023   AST 18 09/06/2023   NA 138 09/07/2023   K 3.9 09/07/2023   CL 106 09/07/2023   CREATININE 1.43 (H) 09/07/2023   BUN 18 09/07/2023   CO2 23 09/07/2023   TSH 1.55 06/22/2023   PSA 0.00 (L) 06/22/2023   INR 1.0 05/04/2023   HGBA1C 5.4 08/24/2023   MICROALBUR <0.2 05/31/2022    No results found.  Assessment & Plan:   Problem List Items Addressed This Visit     Acute renal failure superimposed on stage 3a chronic kidney disease (HCC)  Acute on chronic renal failure due to dehydration.  He was hydrated intravenously. Obtain c-Met      Alcoholism (HCC)   Chronic.  He has been dry since his discharge from the hospital.  He is on thiamine  and a multivitamin      CKD (chronic kidney disease) stage 3, GFR 30-59 ml/min (HCC) - Primary   Acute on chronic Not drinking now Check CMET      Relevant Orders   Comprehensive metabolic  panel with GFR   CBC with Differential/Platelet   Magnesium    Erosive esophagitis   See meds.  Abstain from alcohol      Hypomagnesemia   Check Mag      Relevant Orders   Magnesium    Mild dementia associated with alcoholism, with anxiety (HCC)   Discussed.  His hearing aids are malfunctioning.  He is planning to get them fixed ASAP      Neuroendocrine carcinoma [facility] [place])   S/p bx of the pancreatic mass Pt was seen by Dr. Odean while inpatient  about neuroendocrine tumor. He will see Dr Lanny and Dr Dasie      Relevant Orders   CBC with Differential/Platelet      No orders of the defined types were placed in this encounter.     Follow-up: Return in about 6 weeks (around 10/26/2023) for f/u with PCP.  Marolyn Noel, MD

## 2023-09-14 NOTE — Assessment & Plan Note (Signed)
 Chronic.  He has been dry since his discharge from the hospital.  He is on thiamine  and a multivitamin

## 2023-09-14 NOTE — Assessment & Plan Note (Signed)
Supplemented

## 2023-09-14 NOTE — Assessment & Plan Note (Signed)
 Discussed.  His hearing aids are malfunctioning.  He is planning to get them fixed ASAP

## 2023-09-14 NOTE — Assessment & Plan Note (Signed)
 See meds.  Abstain from alcohol

## 2023-09-15 LAB — COMPREHENSIVE METABOLIC PANEL WITH GFR
ALT: 11 U/L (ref 0–53)
AST: 21 U/L (ref 0–37)
Albumin: 3.7 g/dL (ref 3.5–5.2)
Alkaline Phosphatase: 25 U/L — ABNORMAL LOW (ref 39–117)
BUN: 11 mg/dL (ref 6–23)
CO2: 28 meq/L (ref 19–32)
Calcium: 9.9 mg/dL (ref 8.4–10.5)
Chloride: 107 meq/L (ref 96–112)
Creatinine, Ser: 1.59 mg/dL — ABNORMAL HIGH (ref 0.40–1.50)
GFR: 41.51 mL/min — ABNORMAL LOW (ref >60.00)
Glucose, Bld: 89 mg/dL (ref 70–99)
Potassium: 4.4 meq/L (ref 3.5–5.1)
Sodium: 144 meq/L (ref 135–145)
Total Bilirubin: 0.6 mg/dL (ref 0.2–1.2)
Total Protein: 5.8 g/dL — ABNORMAL LOW (ref 6.0–8.3)

## 2023-09-15 LAB — CBC WITH DIFFERENTIAL/PLATELET
Basophils Absolute: 0.1 K/uL (ref 0.0–0.1)
Basophils Relative: 1.2 % (ref 0.0–3.0)
Eosinophils Absolute: 0.2 K/uL (ref 0.0–0.7)
Eosinophils Relative: 2.5 % (ref 0.0–5.0)
HCT: 38.3 % — ABNORMAL LOW (ref 39.0–52.0)
Hemoglobin: 13.4 g/dL (ref 13.0–17.0)
Lymphocytes Relative: 22.8 % (ref 12.0–46.0)
Lymphs Abs: 1.4 K/uL (ref 0.7–4.0)
MCHC: 34.9 g/dL (ref 30.0–36.0)
MCV: 105.7 fl — ABNORMAL HIGH (ref 78.0–100.0)
Monocytes Absolute: 0.6 K/uL (ref 0.1–1.0)
Monocytes Relative: 9.9 % (ref 3.0–12.0)
Neutro Abs: 3.9 K/uL (ref 1.4–7.7)
Neutrophils Relative %: 63.6 % (ref 43.0–77.0)
Platelets: 277 K/uL (ref 150.0–400.0)
RBC: 3.63 Mil/uL — ABNORMAL LOW (ref 4.22–5.81)
RDW: 12.8 % (ref 11.5–15.5)
WBC: 6.2 K/uL (ref 4.0–10.5)

## 2023-09-15 LAB — MAGNESIUM: Magnesium: 1.7 mg/dL (ref 1.5–2.5)

## 2023-09-17 ENCOUNTER — Ambulatory Visit: Payer: Self-pay | Admitting: Internal Medicine

## 2023-09-17 ENCOUNTER — Other Ambulatory Visit (HOSPITAL_COMMUNITY): Payer: Self-pay

## 2023-09-21 ENCOUNTER — Inpatient Hospital Stay: Attending: Hematology | Admitting: Hematology

## 2023-09-21 ENCOUNTER — Inpatient Hospital Stay

## 2023-09-21 ENCOUNTER — Encounter: Payer: Self-pay | Admitting: Hematology

## 2023-09-21 VITALS — BP 140/70

## 2023-09-21 DIAGNOSIS — K7689 Other specified diseases of liver: Secondary | ICD-10-CM | POA: Insufficient documentation

## 2023-09-21 DIAGNOSIS — I871 Compression of vein: Secondary | ICD-10-CM | POA: Diagnosis not present

## 2023-09-21 DIAGNOSIS — I2510 Atherosclerotic heart disease of native coronary artery without angina pectoris: Secondary | ICD-10-CM | POA: Diagnosis not present

## 2023-09-21 DIAGNOSIS — Z8249 Family history of ischemic heart disease and other diseases of the circulatory system: Secondary | ICD-10-CM | POA: Insufficient documentation

## 2023-09-21 DIAGNOSIS — G4733 Obstructive sleep apnea (adult) (pediatric): Secondary | ICD-10-CM | POA: Diagnosis not present

## 2023-09-21 DIAGNOSIS — K269 Duodenal ulcer, unspecified as acute or chronic, without hemorrhage or perforation: Secondary | ICD-10-CM | POA: Insufficient documentation

## 2023-09-21 DIAGNOSIS — K209 Esophagitis, unspecified without bleeding: Secondary | ICD-10-CM | POA: Diagnosis not present

## 2023-09-21 DIAGNOSIS — Z880 Allergy status to penicillin: Secondary | ICD-10-CM | POA: Diagnosis not present

## 2023-09-21 DIAGNOSIS — I10 Essential (primary) hypertension: Secondary | ICD-10-CM | POA: Insufficient documentation

## 2023-09-21 DIAGNOSIS — R4189 Other symptoms and signs involving cognitive functions and awareness: Secondary | ICD-10-CM | POA: Insufficient documentation

## 2023-09-21 DIAGNOSIS — Z79899 Other long term (current) drug therapy: Secondary | ICD-10-CM | POA: Insufficient documentation

## 2023-09-21 DIAGNOSIS — Z9079 Acquired absence of other genital organ(s): Secondary | ICD-10-CM | POA: Diagnosis not present

## 2023-09-21 DIAGNOSIS — I700 Atherosclerosis of aorta: Secondary | ICD-10-CM | POA: Insufficient documentation

## 2023-09-21 DIAGNOSIS — F4310 Post-traumatic stress disorder, unspecified: Secondary | ICD-10-CM | POA: Insufficient documentation

## 2023-09-21 DIAGNOSIS — R600 Localized edema: Secondary | ICD-10-CM | POA: Diagnosis not present

## 2023-09-21 DIAGNOSIS — Z8379 Family history of other diseases of the digestive system: Secondary | ICD-10-CM | POA: Insufficient documentation

## 2023-09-21 DIAGNOSIS — J90 Pleural effusion, not elsewhere classified: Secondary | ICD-10-CM | POA: Diagnosis not present

## 2023-09-21 DIAGNOSIS — Z8041 Family history of malignant neoplasm of ovary: Secondary | ICD-10-CM | POA: Insufficient documentation

## 2023-09-21 DIAGNOSIS — Z85828 Personal history of other malignant neoplasm of skin: Secondary | ICD-10-CM | POA: Insufficient documentation

## 2023-09-21 DIAGNOSIS — Z8719 Personal history of other diseases of the digestive system: Secondary | ICD-10-CM | POA: Insufficient documentation

## 2023-09-21 DIAGNOSIS — E785 Hyperlipidemia, unspecified: Secondary | ICD-10-CM | POA: Diagnosis not present

## 2023-09-21 DIAGNOSIS — Z800 Family history of malignant neoplasm of digestive organs: Secondary | ICD-10-CM | POA: Insufficient documentation

## 2023-09-21 DIAGNOSIS — F17290 Nicotine dependence, other tobacco product, uncomplicated: Secondary | ICD-10-CM | POA: Diagnosis not present

## 2023-09-21 DIAGNOSIS — D3A8 Other benign neuroendocrine tumors: Secondary | ICD-10-CM | POA: Insufficient documentation

## 2023-09-21 DIAGNOSIS — J449 Chronic obstructive pulmonary disease, unspecified: Secondary | ICD-10-CM | POA: Diagnosis not present

## 2023-09-21 DIAGNOSIS — Z818 Family history of other mental and behavioral disorders: Secondary | ICD-10-CM | POA: Insufficient documentation

## 2023-09-21 DIAGNOSIS — Z860101 Personal history of adenomatous and serrated colon polyps: Secondary | ICD-10-CM | POA: Insufficient documentation

## 2023-09-21 DIAGNOSIS — F17210 Nicotine dependence, cigarettes, uncomplicated: Secondary | ICD-10-CM | POA: Insufficient documentation

## 2023-09-21 DIAGNOSIS — Z8546 Personal history of malignant neoplasm of prostate: Secondary | ICD-10-CM | POA: Diagnosis not present

## 2023-09-21 DIAGNOSIS — Z803 Family history of malignant neoplasm of breast: Secondary | ICD-10-CM | POA: Insufficient documentation

## 2023-09-21 NOTE — Progress Notes (Signed)
 PATIENT NAVIGATOR PROGRESS NOTE  Name: Juan Torres Date: 09/21/2023 MRN: [id number]  DOB: 1945/07/23   Reason for visit:  Initial Med/Onc appt with Dr. Onita Mattock  Comments:   Patient was seen during his initial appt with Dr. Mattock. Patient's wife accompanied patient to his appointment today. Patient was given my direct contact information and was instructed to contact office with any questions or concerns after today.  SDOH assessment was completed and no urgent needs were noted.  Patient was agreeable to have social work reach out due to his history of alcohol. Referral was placed for Social Work.    Time spent counseling/coordinating care: > 60 minutes

## 2023-09-21 NOTE — Progress Notes (Signed)
 [facility]   Telephone:([phone]) [phone] Fax:(336) [phone]   Clinic New Consult Note   Patient Care Team: Joshua Debby CROME, MD as PCP - General (Internal Medicine) Jason Charleston, MD (Inactive) as Consulting Physician (Radiation Oncology) Octavia Charleston, MD as Consulting Physician (Ophthalmology) Douglass Lunger, MD as Consulting Physician (Nephrology) Mansouraty, Aloha Raddle., MD as Consulting Physician (Gastroenterology) Ardis Evalene CROME, RN as Oncology Nurse Navigator 09/21/2023  CHIEF COMPLAINTS/PURPOSE OF CONSULTATION:  Pancreatic neuroendocrine tumor  REFERRING PHYSICIAN: Hospital discharge  Discussed the use of AI scribe software for clinical note transcription with the patient, who gave verbal consent to proceed.  History of Present Illness Juan Torres is a 78 year old male with a newly diagnosed neuroendocrine tumor of the pancreas who presents for a new consult. He is accompanied by his wife, who is a Engineer, civil (consulting). He was referred by Dr. Godina for evaluation of his neuroendocrine tumor.  The neuroendocrine tumor was incidentally discovered following an episode of pancreatitis in June, which led to significant weight loss and subsequent imaging studies.  Abdominal MRI with and without contrast on July 12, 2023 showed a 3.2 x 2.5 cm mass in the head of the pancreas, no nodal or distant metastasis on CT scans.  Patient hide multiple CT chest for lung cancer screening since 2018, the tumor has been present since 2018, which measured 2.0 x 2.6 cm.  Patient underwent EGD and EUS on September 04, 2023, which showed multiple duodenal ulcers, the fine-needle biopsy of the pancreatic head mass confirmed well-differentiated neuroendocrine tumor with Ki-67 1%.  He experienced pancreatitis in June, associated with nausea, vomiting, diarrhea, and dehydration, linked to excessive alcohol consumption, which he has since ceased. His appetite and weight have improved since quitting alcohol, with  weight increasing from 140 pounds to 153 pounds. He currently has no abdominal pain, nausea, or changes in bowel habits, and bowel movements have normalized.  His past medical history includes hypertension, hyperlipidemia, COPD, prediabetes, prostate cancer treated with surgery in 2013, sleep apnea, PTSD, and a history of duodenal ulcers, esophagitis, gastritis, and a hiatal hernia. He is not experiencing symptoms related to these conditions.  Social history reveals a significant history of alcohol use, recently quit, and cigar smoking for over 20 years. He uses marijuana recreationally, which helps with PTSD. He is a [place] veteran, married for 49 years, and remains physically active with yard work and gardening.     MEDICAL HISTORY:  Past Medical History:  Diagnosis Date   Adenomatous colon polyp    Anxiety    Arthritis    FEET   COPD (chronic obstructive pulmonary disease) (HCC)    Depression    Familial tremor    GERD (gastroesophageal reflux disease)    Headache    When younger   History of basal cell cancer 2012   Hyperlipidemia    Hypertension    Nausea vomiting and diarrhea 09/06/2023   Prediabetes    Prostate cancer (HCC) DX 12/19/11   bx=Adenocarcinoma,gleason=3+3=6,volume=42cc,PSA=4.63   PTSD (post-traumatic stress disorder)    Pulmonary nodules 12/10/2018   Numerous unchanged/benign appearing per CT lung 12/2018   Sleep apnea    no cpap   Vitamin D  deficiency     SURGICAL HISTORY: Past Surgical History:  Procedure Laterality Date   COLONOSCOPY W/ POLYPECTOMY     Adenomatous   CYSTOSCOPY N/A 04/04/2012   Procedure: CYSTOSCOPY;  Surgeon: Toribio Neysa Repine, MD;  Location: [facility];  Service: Urology;  Laterality: N/A;  ESOPHAGOGASTRODUODENOSCOPY N/A 09/04/2023   Procedure: EGD (ESOPHAGOGASTRODUODENOSCOPY);  Surgeon: Wilhelmenia Aloha Raddle., MD;  Location: [facility];  Service: Gastroenterology;  Laterality: N/A;   EUS N/A 09/04/2023    Procedure: ULTRASOUND, UPPER GI TRACT, ENDOSCOPIC;  Surgeon: Wilhelmenia Aloha Raddle., MD;  Location: [facility];  Service: Gastroenterology;  Laterality: N/A;   EYE SURGERY     bilateral cataracts    FINE NEEDLE ASPIRATION  09/04/2023   Procedure: FINE NEEDLE ASPIRATION;  Surgeon: Wilhelmenia Aloha Raddle., MD;  Location: [facility];  Service: Gastroenterology;;   INGUINAL HERNIA REPAIR Bilateral 10/18/2018   Procedure: LAPAROSCOPIC BILATERAL INGUINAL HERNIA REPAIR;  Surgeon: Gladis Cough, MD;  Location: [facility];  Service: General;  Laterality: Bilateral;   INGUINAL HERNIA REPAIR Right 04/25/2023   Procedure: OPEN RIGHT INGUINAL HERNIA REPAIR WITH MESH;  Surgeon: Signe Mitzie LABOR, MD;  Location: [facility];  Service: General;  Laterality: Right;   PROSTATE BIOPSY  12/19/2011   Procedure: BIOPSY TRANSRECTAL ULTRASONIC PROSTATE (TUBP);  Surgeon: Toribio Neysa Repine, MD;  Location[facility];  Service: Urology;  Laterality: N/A;     RADIOACTIVE SEED IMPLANT N/A 04/04/2012   Procedure: RADIOACTIVE SEED IMPLANT;  Surgeon: Toribio Neysa Repine, MD;  Location: [facility];  Service: Urology;  Laterality: N/A;  Seeds Implanted     72  Seeds Found in Bladder     None     SOCIAL HISTORY: Social History   Socioeconomic History   Marital status: Married    Spouse name: Not on file   Number of children: 2   Years of education: Not on file   Highest education level: Not on file  Occupational History   Not on file  Tobacco Use   Smoking status: Every Day    Current packs/day: 1.00    Average packs/day: 1 pack/day for 34.8 years (34.8 ttl pk-yrs)    Types: Cigarettes, Cigars    Start date: 12/10/1988   Smokeless tobacco: Never   Tobacco comments:    Smokes Black and milds and using a Nicotine   Vaping Use   Vaping status: Never Used  Substance and Sexual Activity   Alcohol use: Yes    Alcohol/week: 1.0 standard drink of alcohol    Types: 1 Shots of liquor per  week    Comment: used to drink heavy for 20 years, stopped in June 2025   Drug use: Not Currently    Frequency: 7.0 times per week    Types: Marijuana    Comment: / marijuana-last smoked 10/15/2018   Sexual activity: Not Currently  Other Topics Concern   Not on file  Social History Narrative   Lives at home with spouse   Right handed   Caffeine: none   Social Drivers of Corporate investment banker Strain: Low Risk  (08/07/2023)   Overall Financial Resource Strain (CARDIA)    Difficulty of Paying Living Expenses: Not hard at all  Food Insecurity: No Food Insecurity (09/21/2023)   Hunger Vital Sign    Worried About Running Out of Food in the Last Year: Never true    Ran Out of Food in the Last Year: Never true  Transportation Needs: No Transportation Needs (09/21/2023)   PRAPARE - Administrator, Civil Service (Medical): No    Lack of Transportation (Non-Medical): No  Physical Activity: Insufficiently Active (08/07/2023)   Exercise Vital Sign    Days of Exercise per Week: 2 days    Minutes of Exercise per Session: 60 min  Stress: No Stress Concern Present (08/07/2023)   [employer] of Occupational Health - Occupational Stress Questionnaire    Feeling of Stress: Only a little  Social Connections: Socially Integrated (09/03/2023)   Social Connection and Isolation Panel    Frequency of Communication with Friends and Family: More than three times a week    Frequency of Social Gatherings with Friends and Family: More than three times a week    Attends Religious Services: More than 4 times per year    Active Member of [employer] or Organizations: Yes    Attends Engineer, structural: More than 4 times per year    Marital Status: Married  Catering manager Violence: Not At Risk (09/21/2023)   Humiliation, Afraid, Rape, and Kick questionnaire    Fear of Current or Ex-Partner: No    Emotionally Abused: No    Physically Abused: No    Sexually Abused: No    FAMILY  HISTORY: Family History  Problem Relation Age of Onset   Breast cancer Mother 39   Heart attack Father 58   Dementia Father    Prostate cancer Maternal Uncle        prostate   Ulcers Maternal Grandmother    Cancer Maternal Grandfather        colon   Prostate cancer Cousin        prostate cancer 1st maternal    Sleep apnea Neg Hx     ALLERGIES:  is allergic to lipitor [atorvastatin], niacin and related, and penicillins.  MEDICATIONS:  Current Outpatient Medications  Medication Sig Dispense Refill   acetaminophen  (TYLENOL ) 325 MG tablet Take 2 tablets (650 mg total) by mouth every 6 (six) hours as needed for mild pain (pain score 1-3) or fever (or Fever >/= 101).     Cholecalciferol  (VITAMIN D3) 125 MCG (5000 UT) TABS Take 10,000 Units by mouth every morning.     cyanocobalamin  (VITAMIN B12) 500 MCG tablet Take 1 tablet (500 mcg total) by mouth daily. 30 tablet 0   ezetimibe  (ZETIA ) 10 MG tablet Take 1 tablet (10 mg total) by mouth daily for cholesterol. 90 tablet 3   fenofibrate  micronized (LOFIBRA) 134 MG capsule Take 1 capsule (134 mg total) by mouth daily (blood fats). 90 capsule 3   folic acid  (FOLVITE ) 1 MG tablet Take 1 tablet (1 mg total) by mouth daily. 30 tablet 0   loperamide  (IMODIUM  A-D) 2 MG tablet Take 1 tablet (2 mg total) by mouth 4 (four) times daily as needed for diarrhea or loose stools.     magnesium  oxide (MAG-OX) 400 (240 Mg) MG tablet Take 1 tablet (400 mg total) by mouth every evening. 120 tablet 0   Multiple Vitamin (MULTIVITAMIN WITH MINERALS) TABS tablet Take 1 tablet by mouth daily. 30 tablet 0   nicotine  (NICODERM CQ  - DOSED IN MG/24 HOURS) 14 mg/24hr patch Place 1 patch (14 mg total) onto the skin daily. 28 patch 0   Omega-3 Fatty Acids (FISH OIL) 1000 MG CAPS Take 1,000 mg by mouth every morning.     ondansetron  (ZOFRAN -ODT) 4 MG disintegrating tablet Take 1 tablet (4 mg total) by mouth every 8 (eight) hours as needed for nausea or vomiting. 20 tablet 0    pantoprazole  (PROTONIX ) 40 MG tablet Take 1 tablet (40 mg total) by mouth 2 (two) times daily. 180 tablet 0   propranolol  ER (INDERAL  LA) 120 MG 24 hr capsule Take 1 capsule (120 mg total) by mouth daily. For blood pressure and tremor  90 capsule 1   rosuvastatin  (CRESTOR ) 40 MG tablet Take 1 tablet (40 mg total) by mouth daily for cholesterol 90 tablet 0   sucralfate  (CARAFATE ) 1 GM/10ML suspension Take 10 mLs (1 g total) by mouth 4 (four) times daily for 30 days, THEN 10 mLs (1 g total) 2 (two) times daily. 420 mL 0   thiamine  (VITAMIN B-1) 100 MG tablet Take 1 tablet (100 mg total) by mouth daily. 30 tablet 0   No current facility-administered medications for this visit.    REVIEW OF SYSTEMS:   Constitutional: Denies fevers, chills or abnormal night sweats Eyes: Denies blurriness of vision, double vision or watery eyes Ears, nose, mouth, throat, and face: Denies mucositis or sore throat Respiratory: Denies cough, dyspnea or wheezes Cardiovascular: Denies palpitation, chest discomfort or lower extremity swelling Gastrointestinal:  Denies nausea, heartburn or change in bowel habits Skin: Denies abnormal skin rashes Lymphatics: Denies new lymphadenopathy or easy bruising Neurological:Denies numbness, tingling or new weaknesses Behavioral/Psych: Mood is stable, no new changes  All other systems were reviewed with the patient and are negative.  PHYSICAL EXAMINATION: ECOG PERFORMANCE STATUS: 0 - Asymptomatic  Vitals:   09/21/23 1447  BP: (!) 140/70   There were no vitals filed for this visit.  GENERAL:alert, no distress and comfortable SKIN: skin color, texture, turgor are normal, no rashes or significant lesions EYES: normal, conjunctiva are pink and non-injected, sclera clear OROPHARYNX:no exudate, no erythema and lips, buccal mucosa, and tongue normal  NECK: supple, thyroid  normal size, non-tender, without nodularity LYMPH:  no palpable lymphadenopathy in the cervical, axillary  or inguinal LUNGS: clear to auscultation and percussion with normal breathing effort HEART: regular rate & rhythm and no murmurs and no lower extremity edema ABDOMEN:abdomen soft, non-tender and normal bowel sounds Musculoskeletal:no cyanosis of digits and no clubbing  PSYCH: alert & oriented x 3 with fluent speech NEURO: no focal motor/sensory deficits  Physical Exam MEASUREMENTS: Weight- 153. ABDOMEN: Liver not enlarged, non-tender SKIN: Bruising on arm  LABORATORY DATA:  I have reviewed the data as listed    Latest Ref Rng & Units 09/14/2023   12:28 PM 09/07/2023    4:29 AM 09/06/2023    4:28 AM  CBC  WBC 4.0 - 10.5 K/uL 6.2  5.9  5.7   Hemoglobin 13.0 - 17.0 g/dL 86.5  85.8  86.2   Hematocrit 39.0 - 52.0 % 38.3  39.9  38.4   Platelets 150.0 - 400.0 K/uL 277.0  195  183     @cmpl @  RADIOGRAPHIC STUDIES: I have personally reviewed the radiological images as listed and agreed with the findings in the report. CT CHEST WO CONTRAST Result Date: 09/07/2023 CLINICAL DATA:  Pancreatic neuroendocrine neoplasm staging workup * Tracking Code: BO * EXAM: CT CHEST WITHOUT CONTRAST TECHNIQUE: Multidetector CT imaging of the chest was performed following the standard protocol without IV contrast. RADIATION DOSE REDUCTION: This exam was performed according to the departmental dose-optimization program which includes automated exposure control, adjustment of the mA and/or kV according to patient size and/or use of iterative reconstruction technique. COMPARISON:  12/30/2022 FINDINGS: Cardiovascular: Coronary, aortic arch, and branch vessel atherosclerotic vascular disease. Mediastinum/Nodes: Prevascular node 6 mm in short axis on image 105 series 8, stable. No overt pathologic adenopathy. Lungs/Pleura: Mild biapical pleuroparenchymal scarring. 4 by 3 mm right middle lobe nodule on image 97 series 6, no change from 12/10/2018, considered benign. Trace right pleural effusion on image 154 series 2. Upper  Abdomen: Known pancreatic head mass with  punctate central calcification. Abdominal aortic atherosclerosis. Benign hepatic cysts. Musculoskeletal: Suspected sebaceous cyst along the right axilla similar to previous although possibly with some mild cutaneous irregularity. IMPRESSION: 1. No findings of metastatic disease to the chest. 2. Trace right pleural effusion. 3. Known pancreatic head mass. 4. Suspected sebaceous cyst along the right axilla similar to previous although possibly with some mild cutaneous irregularity. 5.  Aortic Atherosclerosis (ICD10-I70.0). Electronically Signed   By: Ryan Salvage M.D.   On: 09/07/2023 12:20   US  Venous Img Lower Bilateral (DVT) Result Date: 08/28/2023 CLINICAL DATA:  Bilateral lower extremity edema.  Elevated D-dimer EXAM: Bilateral LOWER EXTREMITY VENOUS DOPPLER ULTRASOUND TECHNIQUE: Gray-scale sonography with compression, as well as color and duplex ultrasound, were performed to evaluate the deep venous system(s) from the level of the common femoral vein through the popliteal and proximal calf veins. COMPARISON:  Oral FINDINGS: VENOUS Normal compressibility of the common femoral, superficial femoral, and popliteal veins, as well as the visualized calf veins. Visualized portions of profunda femoral vein and great saphenous vein unremarkable. No filling defects to suggest DVT on grayscale or color Doppler imaging. Doppler waveforms show normal direction of venous flow, normal respiratory plasticity and response to augmentation. Limited views of the contralateral common femoral vein are unremarkable. OTHER None. Limitations: none IMPRESSION: No evidence of deep venous thrombosis. Electronically Signed   By: Jackquline Boxer M.D.   On: 08/28/2023 14:08    Assessment & Plan Neuroendocrine tumor of pancreatic head, well-differentiated grade 1, cT2N0M) stage II The neuroendocrine tumor, located in the pancreatic head, has been present since at least 2018 and measures  approximately 2.6 cm in 2018, and now 3.2 cm in 2025.  No nodal or distant metastasis on the scan. -I reviewed the slow-growing nature of his neuroendocrine tumor, and he is asymptomatic. -I discussed the surgery resection is the only remaining option to cure his neuroendocrine tumor.  However given his advanced age, he may not tolerate Whipple surgery very well.  He has seen surgeon Dr. Dasie and has a follow-up appointment in 2 weeks. -I discussed other treatment options, such as Sandostatin injection, everolimus, cabozantinib, Lutathera treatment etc.  Patient is not symptomatic, I do not think he would benefit from Sandostatin injection.  The other treatment option can be considered if he develops symptoms, or his PNET grows significantly.  I think observation alone at this point is reasonable. - Monitor for symptoms such as jaundice, nausea, vomiting, diarrhea, or significant weight loss. - Discuss the case in the tumor conference and communicate with Dr. Dasie.  Hypertension, controlled Hypertension is well-controlled with medication.  Hyperlipidemia, controlled Hyperlipidemia is well-controlled with medication.  Chronic obstructive pulmonary disease (COPD), stable COPD is well-managed without the need for oxygen therapy.  Obstructive sleep apnea, not using CPAP Obstructive sleep apnea is present, but he is not using CPAP therapy.  Post-traumatic stress disorder (PTSD) PTSD is present, and he uses marijuana recreationally, which may alleviate symptoms.  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 Improvement noted since cessation of alcohol.  History of prostate cancer, status post prostatectomy, in remission Prostate cancer is in remission following prostatectomy in 2013. PSA levels are stable.  History of pancreatitis, single episode A single episode of pancreatitis likely related to  alcohol use. No current issues reported.  History of alcohol use disorder, now abstinent He is abstinent from alcohol, leading to improvements in weight and cognitive function.  History of tobacco use (  cigar) He has a history of cigar smoking for over 20 years.  Plan - I reviewed his images, lab, and biopsy results with patient and his wife in detail - He will follow-up with his surgeon Dr. Dasie in 2 weeks to discuss surgery  - He is not symptomatic from his neuroendocrine tumor, I think observation alone is reasonable due to his advanced age and medical comorbidities. - If no surgery, I will follow-up him in 6 months. - Will review his case in GI tumor board in 2 weeks.     No orders of the defined types were placed in this encounter.   All questions were answered. The patient knows to call the clinic with any problems, questions or concerns. I spent 40 minutes counseling the patient face to face. The total time spent in the appointment was 50 minutes including review of chart and various tests results, discussions about plan of care and coordination of care plan.     Onita Mattock, MD 09/21/2023 4:45 PM

## 2023-09-22 ENCOUNTER — Other Ambulatory Visit (HOSPITAL_COMMUNITY): Payer: Self-pay

## 2023-09-25 ENCOUNTER — Other Ambulatory Visit (HOSPITAL_COMMUNITY): Payer: Self-pay

## 2023-09-26 ENCOUNTER — Other Ambulatory Visit (HOSPITAL_COMMUNITY): Payer: Self-pay

## 2023-10-02 ENCOUNTER — Inpatient Hospital Stay: Admitting: Licensed Clinical Social Worker

## 2023-10-02 DIAGNOSIS — D3A8 Other benign neuroendocrine tumors: Secondary | ICD-10-CM

## 2023-10-03 ENCOUNTER — Other Ambulatory Visit (HOSPITAL_COMMUNITY): Payer: Self-pay

## 2023-10-03 NOTE — Progress Notes (Signed)
 [facility] CSW Progress Note  Visual merchandiser spoke with pt's wife over the phone to follow-up on emotional support.    Interventions: Pt's wife reports pt is doing well since discharging from the hospital.  Pt has abstained from drinking and pt's wife is hopeful that he will continue to do so.  Space provided to express feelings regarding recent diagnosis and life circumstances and emotional support provided.  CSW suggested together pt and wife start to plan for the colder months when pt may not have as much to do to have a plan in place if pt feels the need to drink.  Pt is connected to a psychiatrist that he sees as needed.  CSW suggested perhaps booking some regular appointments given all that has occurred over a short period of time to proactively support pt.  Contact information for CSW provided should additional needs arise.        Follow Up Plan:  Patient will contact CSW with any support or resource needs    Devere JONELLE Manna, LCSW Clinical Social Worker [facility]

## 2023-10-04 NOTE — Progress Notes (Signed)
 PATIENT NAVIGATOR PROGRESS NOTE  Name: Juan Torres Date: 10/04/2023 MRN: [id number]  DOB: December 04, 1945   Reason for visit:  Follow-up Phone Call  Comments:   Called and spoke to patient's wife to give patient an update.  Patient's case will be discussed during the 9/3 GI Multidisciplinary Conference.  Informed patient and wife that our office will reach out after the conference to discuss recommendations for treatment. Patient and wife verbalized understanding.    Time spent counseling/coordinating care: 30-45 minutes

## 2023-10-07 ENCOUNTER — Emergency Department (HOSPITAL_COMMUNITY)

## 2023-10-07 ENCOUNTER — Encounter (HOSPITAL_COMMUNITY): Payer: Self-pay

## 2023-10-07 ENCOUNTER — Other Ambulatory Visit: Payer: Self-pay

## 2023-10-07 ENCOUNTER — Emergency Department (HOSPITAL_COMMUNITY)
Admission: EM | Admit: 2023-10-07 | Discharge: 2023-10-07 | Disposition: A | Discharge status: Home / Self Care | Attending: Emergency Medicine | Admitting: Emergency Medicine

## 2023-10-07 DIAGNOSIS — R112 Nausea with vomiting, unspecified: Secondary | ICD-10-CM | POA: Insufficient documentation

## 2023-10-07 DIAGNOSIS — R1013 Epigastric pain: Secondary | ICD-10-CM | POA: Insufficient documentation

## 2023-10-07 DIAGNOSIS — N189 Chronic kidney disease, unspecified: Secondary | ICD-10-CM | POA: Diagnosis not present

## 2023-10-07 DIAGNOSIS — I129 Hypertensive chronic kidney disease with stage 1 through stage 4 chronic kidney disease, or unspecified chronic kidney disease: Secondary | ICD-10-CM | POA: Insufficient documentation

## 2023-10-07 DIAGNOSIS — D72829 Elevated white blood cell count, unspecified: Secondary | ICD-10-CM | POA: Diagnosis not present

## 2023-10-07 LAB — COMPREHENSIVE METABOLIC PANEL WITH GFR
ALT: 11 U/L (ref 0–44)
AST: 25 U/L (ref 15–41)
Albumin: 4.6 g/dL (ref 3.5–5.0)
Alkaline Phosphatase: 50 U/L (ref 38–126)
Anion gap: 21 — ABNORMAL HIGH (ref 5–15)
BUN: 18 mg/dL (ref 8–23)
CO2: 27 mmol/L (ref 22–32)
Calcium: 10.3 mg/dL (ref 8.9–10.3)
Chloride: 92 mmol/L — ABNORMAL LOW (ref 98–111)
Creatinine, Ser: 1.44 mg/dL — ABNORMAL HIGH (ref 0.61–1.24)
GFR, Estimated: 50 mL/min — ABNORMAL LOW (ref >60)
Glucose, Bld: 138 mg/dL — ABNORMAL HIGH (ref 70–99)
Potassium: 3.8 mmol/L (ref 3.5–5.1)
Sodium: 140 mmol/L (ref 135–145)
Total Bilirubin: 1.3 mg/dL — ABNORMAL HIGH (ref 0.0–1.2)
Total Protein: 7.3 g/dL (ref 6.5–8.1)

## 2023-10-07 LAB — BASIC METABOLIC PANEL WITH GFR
Anion gap: 18 — ABNORMAL HIGH (ref 5–15)
BUN: 21 mg/dL (ref 8–23)
CO2: 28 mmol/L (ref 22–32)
Calcium: 9.3 mg/dL (ref 8.9–10.3)
Chloride: 94 mmol/L — ABNORMAL LOW (ref 98–111)
Creatinine, Ser: 1.33 mg/dL — ABNORMAL HIGH (ref 0.61–1.24)
GFR, Estimated: 55 mL/min — ABNORMAL LOW (ref >60)
Glucose, Bld: 113 mg/dL — ABNORMAL HIGH (ref 70–99)
Potassium: 3.3 mmol/L — ABNORMAL LOW (ref 3.5–5.1)
Sodium: 140 mmol/L (ref 135–145)

## 2023-10-07 LAB — CBC WITH DIFFERENTIAL/PLATELET
Abs Immature Granulocytes: 0.07 K/uL (ref 0.00–0.07)
Basophils Absolute: 0 K/uL (ref 0.0–0.1)
Basophils Relative: 0 %
Eosinophils Absolute: 0 K/uL (ref 0.0–0.5)
Eosinophils Relative: 0 %
HCT: 44.8 % (ref 39.0–52.0)
Hemoglobin: 15.7 g/dL (ref 13.0–17.0)
Immature Granulocytes: 1 %
Lymphocytes Relative: 9 %
Lymphs Abs: 1.2 K/uL (ref 0.7–4.0)
MCH: 35.1 pg — ABNORMAL HIGH (ref 26.0–34.0)
MCHC: 35 g/dL (ref 30.0–36.0)
MCV: 100.2 fL — ABNORMAL HIGH (ref 80.0–100.0)
Monocytes Absolute: 1.3 K/uL — ABNORMAL HIGH (ref 0.1–1.0)
Monocytes Relative: 10 %
Neutro Abs: 11.4 K/uL — ABNORMAL HIGH (ref 1.7–7.7)
Neutrophils Relative %: 80 %
Platelets: 244 K/uL (ref 150–400)
RBC: 4.47 MIL/uL (ref 4.22–5.81)
RDW: 11.9 % (ref 11.5–15.5)
WBC: 14.1 K/uL — ABNORMAL HIGH (ref 4.0–10.5)
nRBC: 0 % (ref 0.0–0.2)

## 2023-10-07 LAB — TROPONIN T, HIGH SENSITIVITY
Troponin T High Sensitivity: 23 ng/L — ABNORMAL HIGH (ref 0–19)
Troponin T High Sensitivity: 22 ng/L — ABNORMAL HIGH (ref 0–19)

## 2023-10-07 LAB — LIPASE, BLOOD: Lipase: 121 U/L — ABNORMAL HIGH (ref 11–51)

## 2023-10-07 LAB — LACTIC ACID, PLASMA
Lactic Acid, Venous: 1.7 mmol/L (ref 0.5–1.9)
Lactic Acid, Venous: 1.8 mmol/L (ref 0.5–1.9)

## 2023-10-07 MED ORDER — LORAZEPAM 2 MG/ML IJ SOLN
1.0000 mg | Freq: Once | INTRAMUSCULAR | Status: AC
  Administered 2023-10-07: 1 mg via INTRAVENOUS
  Filled 2023-10-07: qty 1

## 2023-10-07 MED ORDER — LORAZEPAM 1 MG PO TABS
0.5000 mg | ORAL_TABLET | Freq: Two times a day (BID) | ORAL | 0 refills | Status: DC | PRN
  Filled 2023-10-07: qty 2, 2d supply, fill #0

## 2023-10-07 MED ORDER — LACTATED RINGERS IV BOLUS
1000.0000 mL | Freq: Once | INTRAVENOUS | Status: AC
  Administered 2023-10-07: 1000 mL via INTRAVENOUS

## 2023-10-07 MED ORDER — IOHEXOL 300 MG/ML  SOLN
100.0000 mL | Freq: Once | INTRAMUSCULAR | Status: AC | PRN
  Administered 2023-10-07: 100 mL via INTRAVENOUS

## 2023-10-07 MED ORDER — LORAZEPAM 1 MG PO TABS: 0.5000 mg | ORAL_TABLET | Freq: Two times a day (BID) | ORAL | 0 refills | Status: AC | PRN

## 2023-10-07 MED ORDER — PANTOPRAZOLE SODIUM 40 MG IV SOLR
40.0000 mg | Freq: Once | INTRAVENOUS | Status: AC
  Administered 2023-10-07: 40 mg via INTRAVENOUS
  Filled 2023-10-07: qty 10

## 2023-10-07 NOTE — ED Provider Triage Note (Signed)
 Emergency Medicine Provider Triage Evaluation Note  Juan Torres , a 78 y.o. male  was evaluated in triage.  Pt complains of nausea and vomiting x 2 days. Recently diagnosed with pancreatic tumor, seeing GI oncology. Has had diarrhea since taking miralax . Sx started after eating shrimp. Endorses burning sensations in throat.  Hx of esophagitis, duodenitis, gastritis, and hiatal hernia. Has noticed flecks of blood in his vomit. Per family.   Has not been able to eat or drink without vomiting within 20 minutes.  Endorses body aches and chills Denies fever, headache, vision changes, chest pain, shortness of breath, abdominal pain, hematochezia, melena, dysuria.  Review of Systems  Positive: N/a Negative: N/a  Physical Exam  BP (!) 153/73 (BP Location: Left Arm)   Pulse 65   Temp 98.4 F (36.9 C) (Oral)   Resp (!) 25   Ht 6' (1.829 m)   Wt 68.5 kg   SpO2 98%   BMI 20.48 kg/m  Gen:   Awake, no distress   Resp:  Normal effort  MSK:   Moves extremities without difficulty  Other:    Medical Decision Making  Medically screening exam initiated at 3:10 PM.  Appropriate orders placed.  Okey Zelek Kohles was informed that the remainder of the evaluation will be completed by another provider, this initial triage assessment does not replace that evaluation, and the importance of remaining in the ED until their evaluation is complete.     Beola Terrall RAMAN, [place] 10/07/23 [phone]

## 2023-10-07 NOTE — Discharge Instructions (Signed)
 Juan Torres  Thank you for allowing us  to take care of you today.  You came to the Emergency Department today because you had 2 days of persistent nausea and vomiting and you are unable to keep down fluids.  Here in the emergency department your CT showed that you have similar size of your tumor, and redemonstrated some inflammation of your esophagus and stomach which you have previously been diagnosed with.  We recommend continued follow-up with your primary care doctor for these conditions.  Otherwise your labs were overall reassuring.  Your lipase (pancreas number) was mildly elevated, however your pain and CT are not consistent with pancreatitis.  Your labs showed that you were mildly to moderately dehydrated, however we gave you fluids, and after giving you some nausea medication you were able to keep down fluids on your own.  Given you have prolonged QT, we recommend against using Zofran .  We will give you a prescription of 3 doses of sublingual Ativan , if you have recurrent severe nausea or vomiting at home, you can make this medication under your tongue and help with your nausea and vomiting.  You do still have intractable nausea and vomiting despite the sublingual Ativan , please return to the emergency department for further evaluation.  To-Do: 1. Please follow-up with your primary doctor within 1 week / as soon as possible.   Please return to the Emergency Department or call 911 if you experience have worsening of your symptoms, or do not get better, chest pain, shortness of breath, severe or significantly worsening pain, high fever, severe confusion, pass out or have any reason to think that you need emergency medical care.   We hope you feel better soon.   Mitzie Later, MD Department of Emergency Medicine [facility]

## 2023-10-07 NOTE — ED Triage Notes (Signed)
 Patient had recent admission and diagnosed with esophagitis, gastritis and small growing tumor on pancreas.  Was discharged with carafate  but ran out.  Reports this episode of epigastric pain and vomiting started two days ago.  Family reports streaks of blood in vomit.

## 2023-10-07 NOTE — ED Provider Notes (Incomplete)
 [place] EMERGENCY DEPARTMENT AT [facility] Provider Note   CSN: [id number] Arrival date & time: 10/07/23  1423     History Chief Complaint  Patient presents with  . Abdominal Pain     Abdominal Pain  HPI: Juan Torres is a 78 y.o. male with history pertinent for QTc prolongation, HTN, emphysema, OSA, GERD, pancreatic lesion, CKD, HLD, who presents complaining of ***. Patient arrived via {LSARRIVAL:29392} *** History provided by {LSHISTORY:33428}.  {LSINTERPRETER:33419}  Patient reports that he is presenting for persistent nausea and vomiting.  Reports that approximately 2 days ago his wife made dinner and she used a dip that he believes was expired, and since then he has had persistent severe epigastric pain and p.o. intolerance of both solids and liquids.  Reports that he also has significant pain with pain.  Reports that his most recent episodes of emesis have been streaked with blood, though denies prior history of upper GI bleed.  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  Reports that he initially did not have any bowel movements over the past 2 days, however after his wife gave him something for a bowel movement today he had a large formed bowel movement.  Denies any melena or hematochezia.  Endorses chills, denies any fever, chest pain, shortness of breath, abdominal pain outside of when he is trying to eat, having emesis or having hiccups.  Patient's recorded medical, surgical, social, medication list and allergies were reviewed in the Snapshot window as part of the initial history.   Prior to Admission medications   Medication Sig Start Date End Date Taking? Authorizing Provider  acetaminophen  (TYLENOL ) 325 MG tablet Take 2 tablets (650 mg total) by mouth every 6 (six) hours as needed for mild pain (pain score 1-3) or fever (or Fever >/= 101). 09/07/23   Juan Torres  Cholecalciferol  (VITAMIN D3) 125 MCG (5000 UT) TABS Take 10,000 Units by mouth every morning.    Provider, Historical, Torres  cyanocobalamin  (VITAMIN B12) 500 MCG tablet Take 1 tablet (500 mcg total) by mouth daily. 07/24/23   Juan Carliss ORN, DO  ezetimibe  (ZETIA ) 10 MG tablet Take 1 tablet (10 mg total) by mouth daily for cholesterol. 08/08/22   Juan Torres  fenofibrate  micronized (LOFIBRA) 134 MG capsule Take 1 capsule (134 mg total) by mouth daily (blood fats). 06/24/22   Juan Torres  folic acid  (FOLVITE ) 1 MG tablet Take 1 tablet (1 mg total) by mouth daily. 07/24/23   Juan Carliss ORN, DO  loperamide  (IMODIUM  A-D) 2 MG tablet Take 1 tablet (2 mg total) by mouth 4 (four) times daily as needed for diarrhea or loose stools. 09/07/23   Juan Toribio GAILS, Torres  magnesium  oxide (MAG-OX) 400 (240 Mg) MG tablet Take 1 tablet (400 mg total) by mouth every evening. 06/22/23   Juan Torres  Multiple Vitamin (MULTIVITAMIN WITH MINERALS) TABS tablet Take 1 tablet by mouth daily. 07/24/23   Juan Carliss ORN, DO  nicotine  (NICODERM CQ  - DOSED IN MG/24 HOURS) 14 mg/24hr patch Place 1 patch (14 mg total) onto the skin daily. 07/24/23   Juan Carliss ORN, DO  Omega-3 Fatty Acids (FISH OIL) 1000 MG CAPS Take 1,000 mg by mouth every morning.    Provider, Historical, Torres  ondansetron  (ZOFRAN -ODT) 4 MG disintegrating tablet Take 1 tablet (4 mg total) by mouth  every 8 (eight) hours as needed for nausea or vomiting. 09/07/23   Juan Toribio GAILS, Torres  pantoprazole  (PROTONIX ) 40 MG tablet Take 1 tablet (40 mg total) by mouth 2 (two) times daily. 09/07/23   Juan Toribio GAILS, Torres  propranolol  ER (INDERAL  LA) 120 MG 24 hr capsule Take 1 capsule (120 mg total) by mouth daily. For blood pressure and tremor 06/16/23   Juan Torres  rosuvastatin  (CRESTOR ) 40 MG tablet Take 1 tablet (40 mg total) by mouth daily for cholesterol 07/14/23   Juan Torres  sucralfate  (CARAFATE ) 1 GM/10ML suspension Take 10  mLs (1 g total) by mouth 4 (four) times daily for 30 days, THEN 10 mLs (1 g total) 2 (two) times daily. 09/07/23 11/06/23  Juan Toribio GAILS, Torres  thiamine  (VITAMIN B-1) 100 MG tablet Take 1 tablet (100 mg total) by mouth daily. 07/24/23   Juan Carliss ORN, DO     Allergies: Lipitor [atorvastatin], Niacin and related, and Penicillins   Review of Systems   ROS as per HPI  Physical Exam Updated Vital Signs BP (!) 179/84   Pulse 64   Temp 98.4 F (36.9 C) (Oral)   Resp (!) 22   Ht 6' (1.829 m)   Wt 68.5 kg   SpO2 97%   BMI 20.48 kg/m  Physical Exam Vitals and nursing note reviewed.  Constitutional:      General: He is not in acute distress.    Appearance: Normal appearance.  HENT:     Head: Normocephalic and atraumatic.  Eyes:     Extraocular Movements: Extraocular movements intact.  Cardiovascular:     Rate and Rhythm: Normal rate.     Pulses: Normal pulses.  Pulmonary:     Effort: Pulmonary effort is normal.  Abdominal:     Tenderness: There is abdominal tenderness in the epigastric area.  Skin:    General: Skin is warm and dry.     Capillary Refill: Capillary refill takes less than 2 seconds.  Neurological:     General: No focal deficit present.     Mental Status: He is alert and oriented to person, place, and time.     ED Course/ Medical Decision Making/ A&P    Procedures Procedures   Medications Ordered in ED Medications - No data to display  Medical Decision Making:   Juan Torres is a 78 y.o. male who presents for ***as per above.  Physical exam is pertinent for ***.   The differential includes but is not limited to ***.  Independent historian: {LSHISTORIAN:33403}  External data reviewed: {LSEXTERNALDATA:33407}  Initial Plan:  ***  ***Screening labs including CBC and Metabolic panel to evaluate for infectious or metabolic etiology of disease.  ***Urinalysis with reflex culture ordered to evaluate for UTI or relevant urologic/nephrologic pathology.   ***CXR to evaluate for structural/infectious intrathoracic pathology.  {crccardiactesting:32591::EKG to evaluate for cardiac pathology} Objective evaluation as below reviewed   Labs: {LSLABS:33416}  Radiology: {ODMJID:66582} DG Chest 2 View Result Date: 10/07/2023 EXAM: 2 VIEW(S) XRAY OF THE CHEST 10/07/2023 03:32:20 PM COMPARISON: 07/19/2023 CLINICAL HISTORY: Hematemsis. Pt complains of nausea and vomiting x 2 days. Recently diagnosed with pancreatic tumor, seeing GI oncology. Has had diarrhea since taking miralax . Sx started after eating shrimp. Endorses burning sensations in throat. Hx of esophagitis, duodenitis, gastritis, and hiatal hernia. Has noticed flecks of blood in his vomit. FINDINGS: LUNGS AND PLEURA: No focal pulmonary opacity. No pulmonary edema. No pleural effusion. No pneumothorax. HEART AND MEDIASTINUM:  No acute abnormality of the cardiac and mediastinal silhouettes. BONES AND SOFT TISSUES: No acute osseous abnormality. IMPRESSION: 1. No acute process. Electronically signed by: Selinda Blue Torres 10/07/2023 04:06 PM EDT RP Workstation: [id number]    EKG/Medicine tests: {ODZXH:66585} QTc prolongation EKG Interpretation: Sinus rhythm Biatrial enlargement Prolonged QT interval similar to prior EKG Confirmed by Lenor Hollering [phone]) on 10/07/2023 3:01:28 PM  Decision rules:  {Document cardiac monitor, telemetry assessment procedure when appropriate:1} {   Click here for ABCD2, HEART and other calculatorsREFRESH Note before signing :1}   {Document critical care time when appropriate:1} {Document review of labs and clinical decision tools ie heart score, Chads2Vasc2 etc:1}  {Document your independent review of radiology images, and any outside records:1} {Document your discussion with family members, caretakers, and with consultants:1} {Document social determinants of health affecting pt's care:1} {Document your decision making why or why not admission, treatments were  needed:1}                Interventions:***   See the EMR for full details regarding lab and imaging results.  ***  {LSCOPA:33420}  Discussion of management or test interpretations with external provider(s): ***  Risk Drugs:{LSDRUGS:33399} Treatment: {LSTREATMENT:33409} Surgery:{LSSURGERY:33410} Critical Care: ***  Disposition: {LSDISPO:33388}  MDM generated using voice dictation software and may contain dictation errors.  Please contact me for any clarification or with any questions.  Clinical Impression: No diagnosis found.   Data Unavailable   Final Clinical Impression(s) / ED Diagnoses Final diagnoses:  None    Rx / DC Orders ED Discharge Orders     None

## 2023-10-07 NOTE — ED Provider Notes (Signed)
 [place] EMERGENCY DEPARTMENT AT [facility] Provider Note   CSN: [id number] Arrival date & time: 10/07/23  1423     History Chief Complaint  Patient presents with   Abdominal Pain     Abdominal Pain  HPI: Juan Torres is a 78 y.o. male with history pertinent for QTc prolongation, HTN, emphysema, OSA, GERD, pancreatic lesion, CKD, HLD, who presents complaining of persistent nausea and vomiting. Patient arrived via POV accompanied by wife.  History provided by patient.  No interpreter required during this encounter.  Patient reports that he is presenting for persistent nausea and vomiting.  Reports that approximately 2 days ago his wife made dinner and she used a dip that he believes was expired, and since then he has had persistent severe epigastric pain and p.o. intolerance of both solids and liquids.  Reports that he also has significant pain with pain.  Reports that his most recent episodes of emesis have been streaked with blood, though denies prior history of upper GI bleed.  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  Reports that he initially did not have any bowel movements over the past 2 days, however after his wife gave him something for a bowel movement today he had a large formed bowel movement.  Denies any melena or hematochezia.  Endorses chills, denies any fever, chest pain, shortness of breath, abdominal pain outside of when he is trying to eat, having emesis or having hiccups.  Patient's recorded medical, surgical, social, medication list and allergies were reviewed in the Snapshot window as part of the initial history.   Prior to Admission medications   Medication Sig Start Date End Date Taking? Authorizing Provider  acetaminophen  (TYLENOL ) 325 MG tablet Take 2 tablets (650 mg total) by mouth every 6 (six) hours as needed for mild pain (pain score 1-3) or fever (or  Fever >/= 101). 09/07/23   Sebastian Toribio GAILS, MD  Cholecalciferol  (VITAMIN D3) 125 MCG (5000 UT) TABS Take 10,000 Units by mouth every morning.    [provider]  cyanocobalamin  (VITAMIN B12) 500 MCG tablet Take 1 tablet (500 mcg total) by mouth daily. 07/24/23   Arlon Carliss ORN, DO  ezetimibe  (ZETIA ) 10 MG tablet Take 1 tablet (10 mg total) by mouth daily for cholesterol. 08/08/22   Wilkinson, Dana E, NP  fenofibrate  micronized (LOFIBRA) 134 MG capsule Take 1 capsule (134 mg total) by mouth daily (blood fats). 06/24/22   Wilkinson, Dana E, NP  folic acid  (FOLVITE ) 1 MG tablet Take 1 tablet (1 mg total) by mouth daily. 07/24/23   Arlon Carliss ORN, DO  loperamide  (IMODIUM  A-D) 2 MG tablet Take 1 tablet (2 mg total) by mouth 4 (four) times daily as needed for diarrhea or loose stools. 09/07/23   Sebastian Toribio GAILS, MD  LORazepam  (ATIVAN ) 1 MG tablet Take 0.5 tablets (0.5 mg total) by mouth 2 (two) times daily as needed for up to 4 doses (nausea). 10/07/23   Rogelia Jerilynn RAMAN, MD  magnesium  oxide (MAG-OX) 400 (240 Mg) MG tablet Take 1 tablet (400 mg total) by mouth every evening. 06/22/23   Joshua Debby CROME, MD  Multiple Vitamin (MULTIVITAMIN WITH MINERALS) TABS tablet Take 1 tablet by mouth daily. 07/24/23   Arlon Carliss ORN, DO  nicotine  (NICODERM CQ  - DOSED IN MG/24 HOURS) 14 mg/24hr patch Place 1 patch (14 mg total) onto the skin daily.  07/24/23   Arlon Carliss ORN, DO  Omega-3 Fatty Acids (FISH OIL) 1000 MG CAPS Take 1,000 mg by mouth every morning.    [provider]  ondansetron  (ZOFRAN -ODT) 4 MG disintegrating tablet Take 1 tablet (4 mg total) by mouth every 8 (eight) hours as needed for nausea or vomiting. 09/07/23   Sebastian Toribio GAILS, MD  pantoprazole  (PROTONIX ) 40 MG tablet Take 1 tablet (40 mg total) by mouth 2 (two) times daily. 09/07/23   Sebastian Toribio GAILS, MD  propranolol  ER (INDERAL  LA) 120 MG 24 hr capsule Take 1 capsule (120 mg total) by mouth daily. For blood pressure and  tremor 06/16/23   Joshua Debby CROME, MD  rosuvastatin  (CRESTOR ) 40 MG tablet Take 1 tablet (40 mg total) by mouth daily for cholesterol 07/14/23   Joshua Debby CROME, MD  sucralfate  (CARAFATE ) 1 GM/10ML suspension Take 10 mLs (1 g total) by mouth 4 (four) times daily for 30 days, THEN 10 mLs (1 g total) 2 (two) times daily. 09/07/23 11/06/23  Sebastian Toribio GAILS, MD  thiamine  (VITAMIN B-1) 100 MG tablet Take 1 tablet (100 mg total) by mouth daily. 07/24/23   Arlon Carliss ORN, DO     Allergies: Lipitor [atorvastatin], Niacin and related, and Penicillins   Review of Systems   ROS as per HPI  Physical Exam Updated Vital Signs BP (!) 145/75   Pulse 68   Temp 98.1 F (36.7 C) (Oral)   Resp 18   Ht 6' (1.829 m)   Wt 68.5 kg   SpO2 94%   BMI 20.48 kg/m  Physical Exam Vitals and nursing note reviewed.  Constitutional:      General: He is not in acute distress.    Appearance: Normal appearance.  HENT:     Head: Normocephalic and atraumatic.  Eyes:     Extraocular Movements: Extraocular movements intact.  Cardiovascular:     Rate and Rhythm: Normal rate.     Pulses: Normal pulses.  Pulmonary:     Effort: Pulmonary effort is normal.  Abdominal:     Tenderness: There is abdominal tenderness in the epigastric area.  Skin:    General: Skin is warm and dry.     Capillary Refill: Capillary refill takes less than 2 seconds.  Neurological:     General: No focal deficit present.     Mental Status: He is alert and oriented to person, place, and time.     ED Course/ Medical Decision Making/ A&P    Procedures Procedures   Medications Ordered in ED Medications  LORazepam  (ATIVAN ) injection 1 mg (1 mg Intravenous Given 10/07/23 1826)  lactated ringers  bolus 1,000 mL (0 mLs Intravenous Stopped 10/07/23 2310)  pantoprazole  (PROTONIX ) injection 40 mg (40 mg Intravenous Given 10/07/23 1827)  iohexol  (OMNIPAQUE ) 300 MG/ML solution 100 mL (100 mLs Intravenous Contrast Given 10/07/23 1813)    Medical  Decision Making:   Juan Torres is a 78 y.o. male who presents for persistent nausea and vomiting as per above.  Physical exam is pertinent for epigastric tenderness to palpation.   The differential includes but is not limited to pancreatitis, bowel obstruction, gastritis, gastroenteritis, progression of pancreatic malignancy, mesenteric ischemia, dehydration, AKI.  Independent historian: Spouse/partner  External data reviewed: Labs: Reviewed prior labs for baseline creatinine  Labs: Ordered, Independent interpretation, and Details: CMP with creatinine at baseline, no emergent electrolyte derangements, elevation of anion gap most consistent with starvation ketosis in the setting of p.o. intolerance.  Lactic acid WNL at 1.8.  Repeat BMP with improvement of anion gap.  CBC with mild leukocytosis with left shift, most consistent with demargination in the setting of persistent emesis.  Initial troponin 23, delta 22.  Lipase mildly elevated at 121, increased from baseline, however patient has history of previously elevated lipase is in the 60s to 80s  Radiology: Ordered, Independent interpretation, and Details: Chest x-ray without focal airspace opacification, cardiomediastinal silhouette derangement, pneumothorax, pleural effusion, bony derangement.  CT abdomen pelvis with no free air, significant free fluid, worsening mass lesion, obstructive bowel gas pattern CT ABDOMEN PELVIS W CONTRAST Result Date: 10/07/2023 CLINICAL DATA:  Bowel obstruction suspected 2 days persistent vomiting, history of underlying pancreas tumor Technologist notes state recent admission for esophagitis, gastritis. EXAM: CT ABDOMEN AND PELVIS WITH CONTRAST TECHNIQUE: Multidetector CT imaging of the abdomen and pelvis was performed using the standard protocol following bolus administration of intravenous contrast. RADIATION DOSE REDUCTION: This exam was performed according to the departmental dose-optimization program which includes  automated exposure control, adjustment of the mA and/or kV according to patient size and/or use of iterative reconstruction technique. CONTRAST:  OMNIPAQUE  IOHEXOL  300 MG/ML  SOLN COMPARISON:  Abdominal MRI 07/12/2023 FINDINGS: Lower chest: Trace right pleural effusion. Moderate wall thickening of the included distal esophagus. Hepatobiliary: Small cysts scattered throughout the liver. No evidence of solid liver lesion. Mild hepatic steatosis. Gallbladder physiologically distended, no calcified stone. No biliary dilatation. 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 Lesion is smoothly marginated and this is unchanged in size from prior MRI. Punctate density centrally may represent a calcification. There is no pancreatic ductal dilatation. No acute peripancreatic inflammation. Spleen: Normal in size without focal abnormality. Adrenals/Urinary Tract: No adrenal nodule. No hydronephrosis, renal calculi or suspicious renal lesion. Partially distended urinary bladder, equivocal bladder wall thickening. Stomach/Bowel: Moderate wall thickening of the included distal esophagus with mild adjacent soft tissue edema. There is irregular wall thickening of the distal stomach, series 2, image 20. No duodenal mass effect from pancreatic lesion. Fluid within nondilated small bowel. No small bowel obstruction or small bowel wall thickening. Fluid/liquid stool throughout the colon with colonic air-fluid levels. No colonic wall thickening. Mild colonic diverticulosis without focal diverticulitis. Normal appendix visualized. Vascular/Lymphatic: Advanced aortic and branch atherosclerosis. No aortic aneurysm. Patent portal, splenic and mesenteric veins. 7 mm short axis paraesophageal lymph node, series 2, image 11. Small celiac nodes are not enlarged by size criteria. 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 Other: No trace free fluid in the left upper quadrant. No free  intra-abdominal air. Previous right inguinal hernia repair. Small fat containing left inguinal hernia. Musculoskeletal: There are no acute or suspicious osseous abnormalities. Multilevel degenerative change in the spine. IMPRESSION: 1. Moderate wall thickening of the included distal esophagus with mild adjacent soft tissue edema, suspicious for esophagitis. 2. Irregular wall thickening of the distal stomach. Differential considerations include gastritis, peptic ulcer disease, and neoplasm. Recommend correlation with recent EGD. 3. Fluid/liquid stool throughout the colon with colonic air-fluid levels, can be seen with diarrheal illness. No bowel obstruction. 4. 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 5. Trace right pleural effusion. Aortic Atherosclerosis (ICD10-I70.0). Electronically Signed   By: Andrea Gasman M.D.   On: 10/07/2023 18:44   DG Chest 2 View Result Date: 10/07/2023 EXAM: 2 VIEW(S) XRAY OF THE CHEST 10/07/2023 03:32:20 PM COMPARISON: 07/19/2023 CLINICAL HISTORY: Hematemsis. Pt complains of nausea and vomiting x 2 days. Recently diagnosed with  pancreatic tumor, seeing GI oncology. Has had diarrhea since taking miralax . Sx started after eating shrimp. Endorses burning sensations in throat. Hx of esophagitis, duodenitis, gastritis, and hiatal hernia. Has noticed flecks of blood in his vomit. FINDINGS: LUNGS AND PLEURA: No focal pulmonary opacity. No pulmonary edema. No pleural effusion. No pneumothorax. HEART AND MEDIASTINUM: No acute abnormality of the cardiac and mediastinal silhouettes. BONES AND SOFT TISSUES: No acute osseous abnormality. IMPRESSION: 1. No acute process. Electronically signed by: Selinda Blue MD 10/07/2023 04:06 PM EDT RP Workstation: [id number]    EKG/Medicine tests: Ordered and Independent interpretation QTc prolongation EKG Interpretation: Rate 67, sinus rhythm, no ST elevations or depressions, QTc prolongation and  biphasic V1 is similar in comparison to prior                Interventions: Ativan , Protonix , LR bolus  See the EMR for full details regarding lab and imaging results.  Patient overall well-appearing on exam, reports persistent nausea and vomiting and p.o. intolerance, given patient age, intolerance, do feel that labs and imaging are indicated.  Patient with history of gastritis, esophagitis, pancreatic mass, all of which are redemonstrated on CT today, however no structural intra-abdominal pathology.  Patient with mild elevation of lipase, however does not have persistent abdominal pain, no CT findings consistent with pancreatitis, therefore doubt pancreatitis as etiology.  No emergent electrolyte derangements or AKI on chemistries, patient does not have anion gap elevation most consistent with starvation ketosis in the setting of 2 days of no oral intake, however after fluids patient with improvement of anion gap.  CBC additionally with mild leukocytosis, however in the absence of acute processes on CT and lack of fever, most likely secondary to stress demargination due to persistent vomiting.  Given QTc prolongation, patient given Ativan  for nausea, and thereafter had resolution of symptoms.  Additionally given Protonix  given blood-streaked emesis, however given patient had overall small emesis, has stable hemoglobin, and had multiple rounds of emesis prior to having any blood streaking, feel this is most consistent with mucosal irritation, low index of suspicion for variceal hemorrhage or perforation.  Lactic acid also reassuring, doubt severe dehydration, sepsis, shock.  After fluid rehydration and Ativan , patient feels back to baseline, able to tolerate p.o. in the ED.  Will discharge with short course of Ativan  for recurrent breakthrough nausea and vomiting.  Plans and results discussed with wife and patient who expressed understanding and are comfortable with plan.  Presentation is most consistent  with acute complicated illness  Discussion of management or test interpretations with external provider(s): Not indicated  Risk Drugs:OTC drugs, Prescription drug management, and Parenteral controlled substances  Disposition: DISCHARGE: I believe that the patient is safe for discharge home with outpatient follow-up. Patient was informed of all pertinent physical exam, laboratory, and imaging findings.  Patient's suspected etiology of their symptom presentation was discussed with the patient and all questions were answered. We discussed following up with PCP. I provided thorough ED return precautions. The patient feels safe and comfortable with this plan.  MDM generated using voice dictation software and may contain dictation errors.  Please contact me for any clarification or with any questions.  Clinical Impression:  1. Nausea and vomiting, unspecified vomiting type      Discharge   Final Clinical Impression(s) / ED Diagnoses Final diagnoses:  Nausea and vomiting, unspecified vomiting type    Rx / DC Orders ED Discharge Orders          Ordered    LORazepam  (  ATIVAN ) 1 MG tablet  2 times daily PRN,   Status:  Discontinued        10/07/23 2309    LORazepam  (ATIVAN ) 1 MG tablet  2 times daily PRN        10/07/23 2317             Rogelia Jerilynn RAMAN, MD 10/08/23 [phone]

## 2023-10-08 ENCOUNTER — Other Ambulatory Visit: Payer: Self-pay

## 2023-10-10 ENCOUNTER — Other Ambulatory Visit (HOSPITAL_COMMUNITY): Payer: Self-pay

## 2023-10-10 ENCOUNTER — Other Ambulatory Visit: Payer: Self-pay | Admitting: Internal Medicine

## 2023-10-11 ENCOUNTER — Other Ambulatory Visit: Payer: Self-pay | Admitting: Internal Medicine

## 2023-10-11 ENCOUNTER — Telehealth: Payer: Self-pay | Admitting: Gastroenterology

## 2023-10-11 ENCOUNTER — Other Ambulatory Visit (HOSPITAL_COMMUNITY): Payer: Self-pay

## 2023-10-11 ENCOUNTER — Other Ambulatory Visit: Payer: Self-pay | Admitting: *Deleted

## 2023-10-11 NOTE — Telephone Encounter (Signed)
 Inbound call from patients wife stating that patient had a procedure with Dr. Agatha on the 28th of July and was told that someone would be reaching out from our office to schedule a follow up appointment. She states she never heard from anyone so she wanted to make the appointment. Patient was scheduled for 10/20 at 10:00 with Camie. She is unsure if he should wait that long and is requesting a call back to discuss. Please advise.

## 2023-10-11 NOTE — Progress Notes (Signed)
 PATIENT NAVIGATOR PROGRESS NOTE  Name: [name] Date: 10/11/2023 MRN: [id number]  DOB: 1945/08/14   Reason for visit:  Phone call Follow-up   Comments:   Called and spoke to patient's wife regarding recommendations that were discussed during the 9/3 GI MDC. Recommendation is for observation.  Patient is to follow-up with Dr. Lanny in 5 months.  Patient's wife verbalized understanding and agreed to relay information on to patient.   Scheduling message sent for patient to be scheduled for follow-up in February 2026.   Patient is established with a treatment plan (Observation) and is actively engaged in care. Nurse Navigator services not currently indicated at this time. Will re-evaluate if needs change or if additional support is requested.    Time spent counseling/coordinating care: 30-45 minutes

## 2023-10-11 NOTE — Telephone Encounter (Signed)
 Copied from CRM [id number]. Topic: Clinical - Medication Refill >> Oct 11, 2023  1:29 PM Thersia C wrote: Medication: sucralfate  (CARAFATE ) 1 GM/10ML suspension nicotine  (NICODERM CQ  - DOSED IN MG/24 HOURS) 14 mg/24hr patch  Has the patient contacted their pharmacy? Yes (Agent: If no, request that the patient contact the pharmacy for the refill. If patient does not wish to contact the pharmacy document the reason why and proceed with request.) (Agent: If yes, when and what did the pharmacy advise?)  This is the patient's preferred pharmacy:  [facility] [address], [address] [place] [address] Phone: [phone] Fax: [phone]    Is this the correct pharmacy for this prescription? Yes If no, delete pharmacy and type the correct one.   Has the prescription been filled recently? No  Is the patient out of the medication? Yes  Has the patient been seen for an appointment in the last year OR does the patient have an upcoming appointment? Yes  Can we respond through MyChart? Yes  Agent: Please be advised that Rx refills may take up to 3 business days. We ask that you follow-up with your pharmacy.

## 2023-10-12 ENCOUNTER — Ambulatory Visit: Payer: Medicare HMO | Admitting: Nurse Practitioner

## 2023-10-12 NOTE — Telephone Encounter (Signed)
 Left message on machine to call back

## 2023-10-13 ENCOUNTER — Emergency Department (HOSPITAL_COMMUNITY)

## 2023-10-13 ENCOUNTER — Observation Stay (HOSPITAL_COMMUNITY)

## 2023-10-13 ENCOUNTER — Encounter (HOSPITAL_COMMUNITY): Payer: Self-pay

## 2023-10-13 ENCOUNTER — Other Ambulatory Visit: Payer: Self-pay

## 2023-10-13 ENCOUNTER — Observation Stay (HOSPITAL_COMMUNITY)
Admission: EM | Admit: 2023-10-13 | Discharge: 2023-10-15 | Disposition: A | Discharge status: Home / Self Care | Attending: Internal Medicine | Admitting: Internal Medicine

## 2023-10-13 DIAGNOSIS — K8689 Other specified diseases of pancreas: Secondary | ICD-10-CM | POA: Diagnosis present

## 2023-10-13 DIAGNOSIS — Z79899 Other long term (current) drug therapy: Secondary | ICD-10-CM | POA: Diagnosis not present

## 2023-10-13 DIAGNOSIS — Z8546 Personal history of malignant neoplasm of prostate: Secondary | ICD-10-CM | POA: Insufficient documentation

## 2023-10-13 DIAGNOSIS — G4733 Obstructive sleep apnea (adult) (pediatric): Secondary | ICD-10-CM | POA: Diagnosis not present

## 2023-10-13 DIAGNOSIS — K869 Disease of pancreas, unspecified: Secondary | ICD-10-CM | POA: Insufficient documentation

## 2023-10-13 DIAGNOSIS — I4581 Long QT syndrome: Secondary | ICD-10-CM | POA: Diagnosis not present

## 2023-10-13 DIAGNOSIS — F17210 Nicotine dependence, cigarettes, uncomplicated: Secondary | ICD-10-CM | POA: Insufficient documentation

## 2023-10-13 DIAGNOSIS — N1831 Chronic kidney disease, stage 3a: Secondary | ICD-10-CM | POA: Diagnosis not present

## 2023-10-13 DIAGNOSIS — J449 Chronic obstructive pulmonary disease, unspecified: Secondary | ICD-10-CM | POA: Insufficient documentation

## 2023-10-13 DIAGNOSIS — I129 Hypertensive chronic kidney disease with stage 1 through stage 4 chronic kidney disease, or unspecified chronic kidney disease: Secondary | ICD-10-CM | POA: Diagnosis not present

## 2023-10-13 DIAGNOSIS — E782 Mixed hyperlipidemia: Secondary | ICD-10-CM | POA: Diagnosis not present

## 2023-10-13 DIAGNOSIS — K219 Gastro-esophageal reflux disease without esophagitis: Secondary | ICD-10-CM | POA: Diagnosis present

## 2023-10-13 DIAGNOSIS — R9431 Abnormal electrocardiogram [ECG] [EKG]: Secondary | ICD-10-CM | POA: Diagnosis present

## 2023-10-13 DIAGNOSIS — I10 Essential (primary) hypertension: Secondary | ICD-10-CM | POA: Diagnosis present

## 2023-10-13 DIAGNOSIS — R112 Nausea with vomiting, unspecified: Principal | ICD-10-CM

## 2023-10-13 DIAGNOSIS — R519 Headache, unspecified: Secondary | ICD-10-CM | POA: Diagnosis present

## 2023-10-13 DIAGNOSIS — R1110 Vomiting, unspecified: Secondary | ICD-10-CM | POA: Diagnosis present

## 2023-10-13 DIAGNOSIS — J439 Emphysema, unspecified: Secondary | ICD-10-CM | POA: Diagnosis not present

## 2023-10-13 DIAGNOSIS — Z85828 Personal history of other malignant neoplasm of skin: Secondary | ICD-10-CM | POA: Insufficient documentation

## 2023-10-13 DIAGNOSIS — K56609 Unspecified intestinal obstruction, unspecified as to partial versus complete obstruction: Secondary | ICD-10-CM

## 2023-10-13 DIAGNOSIS — N179 Acute kidney failure, unspecified: Secondary | ICD-10-CM | POA: Diagnosis not present

## 2023-10-13 LAB — URINALYSIS, ROUTINE W REFLEX MICROSCOPIC
Bilirubin Urine: NEGATIVE
Glucose, UA: NEGATIVE mg/dL
Hgb urine dipstick: NEGATIVE
Ketones, ur: NEGATIVE mg/dL
Leukocytes,Ua: NEGATIVE
Nitrite: NEGATIVE
Protein, ur: NEGATIVE mg/dL
Specific Gravity, Urine: 1.018 (ref 1.005–1.030)
pH: 5 (ref 5.0–8.0)

## 2023-10-13 LAB — COMPREHENSIVE METABOLIC PANEL WITH GFR
ALT: 10 U/L (ref 0–44)
AST: 21 U/L (ref 15–41)
Albumin: 4.3 g/dL (ref 3.5–5.0)
Alkaline Phosphatase: 51 U/L (ref 38–126)
Anion gap: 29 — ABNORMAL HIGH (ref 5–15)
BUN: 43 mg/dL — ABNORMAL HIGH (ref 8–23)
CO2: 23 mmol/L (ref 22–32)
Calcium: 10.4 mg/dL — ABNORMAL HIGH (ref 8.9–10.3)
Chloride: 89 mmol/L — ABNORMAL LOW (ref 98–111)
Creatinine, Ser: 2.4 mg/dL — ABNORMAL HIGH (ref 0.61–1.24)
GFR, Estimated: 27 mL/min — ABNORMAL LOW (ref >60)
Glucose, Bld: 155 mg/dL — ABNORMAL HIGH (ref 70–99)
Potassium: 3.6 mmol/L (ref 3.5–5.1)
Sodium: 141 mmol/L (ref 135–145)
Total Bilirubin: 1 mg/dL (ref 0.0–1.2)
Total Protein: 6.8 g/dL (ref 6.5–8.1)

## 2023-10-13 LAB — CBC
HCT: 51 % (ref 39.0–52.0)
Hemoglobin: 18 g/dL — ABNORMAL HIGH (ref 13.0–17.0)
MCH: 34.1 pg — ABNORMAL HIGH (ref 26.0–34.0)
MCHC: 35.3 g/dL (ref 30.0–36.0)
MCV: 96.6 fL (ref 80.0–100.0)
Platelets: 263 K/uL (ref 150–400)
RBC: 5.28 MIL/uL (ref 4.22–5.81)
RDW: 11.5 % (ref 11.5–15.5)
WBC: 15.8 K/uL — ABNORMAL HIGH (ref 4.0–10.5)
nRBC: 0 % (ref 0.0–0.2)

## 2023-10-13 LAB — BASIC METABOLIC PANEL WITH GFR
Anion gap: 18 — ABNORMAL HIGH (ref 5–15)
BUN: 42 mg/dL — ABNORMAL HIGH (ref 8–23)
CO2: 24 mmol/L (ref 22–32)
Calcium: 9.1 mg/dL (ref 8.9–10.3)
Chloride: 94 mmol/L — ABNORMAL LOW (ref 98–111)
Creatinine, Ser: 1.87 mg/dL — ABNORMAL HIGH (ref 0.61–1.24)
GFR, Estimated: 37 mL/min — ABNORMAL LOW (ref >60)
Glucose, Bld: 96 mg/dL (ref 70–99)
Potassium: 3.2 mmol/L — ABNORMAL LOW (ref 3.5–5.1)
Sodium: 137 mmol/L (ref 135–145)

## 2023-10-13 LAB — BLOOD GAS, VENOUS
Acid-Base Excess: 6.9 mmol/L — ABNORMAL HIGH (ref 0.0–2.0)
Bicarbonate: 32 mmol/L — ABNORMAL HIGH (ref 20.0–28.0)
O2 Saturation: 51.1 %
Patient temperature: 37
pCO2, Ven: 45 mmHg (ref 44–60)
pH, Ven: 7.46 — ABNORMAL HIGH (ref 7.25–7.43)
pO2, Ven: 32 mmHg (ref 32–45)

## 2023-10-13 LAB — LIPASE, BLOOD: Lipase: 115 U/L — ABNORMAL HIGH (ref 11–51)

## 2023-10-13 LAB — LACTIC ACID, PLASMA
Lactic Acid, Venous: 2.1 mmol/L (ref 0.5–1.9)
Lactic Acid, Venous: 1.9 mmol/L (ref 0.5–1.9)

## 2023-10-13 MED ORDER — HYDRALAZINE HCL 20 MG/ML IJ SOLN: 10.0000 mg | INTRAMUSCULAR | Status: DC | PRN

## 2023-10-13 MED ORDER — POTASSIUM CHLORIDE 10 MEQ/100ML IV SOLN
10.0000 meq | INTRAVENOUS | Status: AC
  Administered 2023-10-13 (×3): 10 meq via INTRAVENOUS
  Filled 2023-10-13: qty 100

## 2023-10-13 MED ORDER — LORAZEPAM 2 MG/ML IJ SOLN
0.5000 mg | Freq: Once | INTRAMUSCULAR | Status: AC
  Administered 2023-10-13: 0.5 mg via INTRAVENOUS
  Filled 2023-10-13: qty 1

## 2023-10-13 MED ORDER — ACETAMINOPHEN 325 MG PO TABS: 650.0000 mg | ORAL_TABLET | Freq: Four times a day (QID) | ORAL | Status: DC | PRN

## 2023-10-13 MED ORDER — PANTOPRAZOLE SODIUM 40 MG IV SOLR
40.0000 mg | INTRAVENOUS | Status: DC
  Administered 2023-10-13 – 2023-10-14 (×2): 40 mg via INTRAVENOUS
  Filled 2023-10-13 (×2): qty 10

## 2023-10-13 MED ORDER — SODIUM CHLORIDE 0.9 % IV BOLUS
1000.0000 mL | Freq: Once | INTRAVENOUS | Status: AC
  Administered 2023-10-13: 1000 mL via INTRAVENOUS

## 2023-10-13 MED ORDER — PROCHLORPERAZINE EDISYLATE 10 MG/2ML IJ SOLN: 10.0000 mg | Freq: Four times a day (QID) | INTRAMUSCULAR | Status: DC | PRN

## 2023-10-13 MED ORDER — ACETAMINOPHEN 650 MG RE SUPP: 650.0000 mg | Freq: Four times a day (QID) | RECTAL | Status: DC | PRN

## 2023-10-13 MED ORDER — SODIUM CHLORIDE 0.45 % IV BOLUS
1000.0000 mL | Freq: Once | INTRAVENOUS | Status: AC
  Administered 2023-10-13: 1000 mL via INTRAVENOUS

## 2023-10-13 MED ORDER — MAGNESIUM SULFATE 2 GM/50ML IV SOLN
2.0000 g | Freq: Once | INTRAVENOUS | Status: AC
  Administered 2023-10-13: 2 g via INTRAVENOUS
  Filled 2023-10-13: qty 50

## 2023-10-13 MED ORDER — DIATRIZOATE MEGLUMINE & SODIUM 66-10 % PO SOLN
90.0000 mL | Freq: Once | ORAL | Status: AC
  Administered 2023-10-13: 90 mL via ORAL
  Filled 2023-10-13: qty 90

## 2023-10-13 MED ORDER — LACTATED RINGERS IV SOLN: INTRAVENOUS | Status: AC

## 2023-10-13 MED ORDER — LACTATED RINGERS IV SOLN: INTRAVENOUS | Status: DC

## 2023-10-13 MED ORDER — ENOXAPARIN SODIUM 30 MG/0.3ML IJ SOSY
30.0000 mg | PREFILLED_SYRINGE | INTRAMUSCULAR | Status: DC
  Administered 2023-10-13 – 2023-10-14 (×2): 30 mg via SUBCUTANEOUS
  Filled 2023-10-13 (×2): qty 0.3

## 2023-10-13 MED ORDER — TRIMETHOBENZAMIDE HCL 100 MG/ML IM SOLN
200.0000 mg | INTRAMUSCULAR | Status: AC
  Administered 2023-10-13: 200 mg via INTRAMUSCULAR
  Filled 2023-10-13: qty 2

## 2023-10-13 NOTE — ED Provider Notes (Signed)
 [place] EMERGENCY DEPARTMENT AT [facility] Provider Note   CSN: [id number] Arrival date & time: 10/13/23  0236     Patient presents with: Emesis   Juan Torres is a 78 y.o. male with history of hypertension, prostate cancer, PTSD, sleep apnea, GERD, headaches, depression, COPD, alcoholism in remission, erosive esophagitis, mass on pancreas.  Patient presents to ED for evaluation of nausea and vomiting.  States that he was seen for this issue on 8/30.  Had reassuring workup and was discharged with Ativan  for nausea.  Patient was given Ativan  due to prolonged QT.  Reports that he was doing fine at home until Wednesday when he began having nausea and vomiting.  Reports that on Wednesday he ate 1 piece of pizza as well as some barbecue ribs.  Reports began having nausea after this.  Reports he has had persistent nausea since Wednesday.  Tried taking at home Ativan  without relief.  Denies any abdominal pain, fevers, dysuria, chest pain, shortness of breath.  States that he did have some diarrhea today multiple times but there was no blood in his diarrhea.  Reports multiple episodes of nausea and vomiting for the last 2 days but denies any blood in his vomit.  Recently found to have pancreatic mass currently being worked up by GI as well as general surgery.  Denies any recent alcohol use.  Denies recent antibiotics, travel.  Patient reports to me that he has had nausea and vomiting every day for the last 5 days.  Wife at bedside unsure of this.  Patient adamant that he has been throwing up every day.   Emesis Associated symptoms: diarrhea        Prior to Admission medications   Medication Sig Start Date End Date Taking? Authorizing Provider  acetaminophen  (TYLENOL ) 325 MG tablet Take 2 tablets (650 mg total) by mouth every 6 (six) hours as needed for mild pain (pain score 1-3) or fever (or Fever >/= 101). 09/07/23   Sebastian Toribio GAILS, MD  Cholecalciferol  (VITAMIN D3) 125 MCG (5000  UT) TABS Take 10,000 Units by mouth every morning.    [provider]  cyanocobalamin  (VITAMIN B12) 500 MCG tablet Take 1 tablet (500 mcg total) by mouth daily. 07/24/23   Arlon Carliss ORN, DO  ezetimibe  (ZETIA ) 10 MG tablet Take 1 tablet (10 mg total) by mouth daily for cholesterol. 08/08/22   Wilkinson, Dana E, NP  fenofibrate  micronized (LOFIBRA) 134 MG capsule Take 1 capsule (134 mg total) by mouth daily (blood fats). 06/24/22   Wilkinson, Dana E, NP  folic acid  (FOLVITE ) 1 MG tablet Take 1 tablet (1 mg total) by mouth daily. 07/24/23   Arlon Carliss ORN, DO  loperamide  (IMODIUM  A-D) 2 MG tablet Take 1 tablet (2 mg total) by mouth 4 (four) times daily as needed for diarrhea or loose stools. 09/07/23   Sebastian Toribio GAILS, MD  LORazepam  (ATIVAN ) 1 MG tablet Take 0.5 tablets (0.5 mg total) by mouth 2 (two) times daily as needed for up to 4 doses (nausea). 10/07/23   Rogelia Jerilynn RAMAN, MD  magnesium  oxide (MAG-OX) 400 (240 Mg) MG tablet Take 1 tablet (400 mg total) by mouth every evening. 06/22/23   Joshua Debby CROME, MD  Multiple Vitamin (MULTIVITAMIN WITH MINERALS) TABS tablet Take 1 tablet by mouth daily. 07/24/23   Arlon Carliss ORN, DO  nicotine  (NICODERM CQ  - DOSED IN MG/24 HOURS) 14 mg/24hr patch Place 1 patch (14 mg total) onto the skin daily. 07/24/23  Arlon Carliss ORN, DO  Omega-3 Fatty Acids (FISH OIL) 1000 MG CAPS Take 1,000 mg by mouth every morning.    [provider]  ondansetron  (ZOFRAN -ODT) 4 MG disintegrating tablet Take 1 tablet (4 mg total) by mouth every 8 (eight) hours as needed for nausea or vomiting. 09/07/23   Sebastian Toribio GAILS, MD  pantoprazole  (PROTONIX ) 40 MG tablet Take 1 tablet (40 mg total) by mouth 2 (two) times daily. 09/07/23   Sebastian Toribio GAILS, MD  propranolol  ER (INDERAL  LA) 120 MG 24 hr capsule Take 1 capsule (120 mg total) by mouth daily. For blood pressure and tremor 06/16/23   Joshua Debby CROME, MD  rosuvastatin  (CRESTOR ) 40 MG tablet Take 1 tablet (40 mg  total) by mouth daily for cholesterol 07/14/23   Joshua Debby CROME, MD  sucralfate  (CARAFATE ) 1 GM/10ML suspension Take 10 mLs (1 g total) by mouth 4 (four) times daily for 30 days, THEN 10 mLs (1 g total) 2 (two) times daily. 09/07/23 11/06/23  Sebastian Toribio GAILS, MD  thiamine  (VITAMIN B-1) 100 MG tablet Take 1 tablet (100 mg total) by mouth daily. 07/24/23   Arlon Carliss ORN, DO    Allergies: Lipitor [atorvastatin], Niacin and related, and Penicillins    Review of Systems  Gastrointestinal:  Positive for diarrhea, nausea and vomiting.  All other systems reviewed and are negative.   Updated Vital Signs BP (!) 145/83   Pulse 77   Temp (!) 97.5 F (36.4 C) (Oral)   Resp 19   SpO2 95%   Physical Exam Vitals and nursing note reviewed.  Constitutional:      General: He is not in acute distress.    Appearance: He is well-developed.  HENT:     Head: Normocephalic and atraumatic.     Mouth/Throat:     Mouth: Mucous membranes are dry.  Eyes:     Conjunctiva/sclera: Conjunctivae normal.  Cardiovascular:     Rate and Rhythm: Normal rate and regular rhythm.     Heart sounds: No murmur heard. Pulmonary:     Effort: Pulmonary effort is normal. No respiratory distress.     Breath sounds: Normal breath sounds.  Abdominal:     Palpations: Abdomen is soft.     Tenderness: There is no abdominal tenderness.  Musculoskeletal:        General: No swelling.     Cervical back: Neck supple.  Skin:    General: Skin is warm and dry.     Capillary Refill: Capillary refill takes less than 2 seconds.  Neurological:     Mental Status: He is alert and oriented to person, place, and time. Mental status is at baseline.  Psychiatric:        Mood and Affect: Mood normal.     (all labs ordered are listed, but only abnormal results are displayed) Labs Reviewed  CBC - Abnormal; Notable for the following components:      Result Value   WBC 15.8 (*)    Hemoglobin 18.0 (*)    MCH 34.1 (*)    All other  components within normal limits  GASTROINTESTINAL PANEL BY PCR, STOOL (REPLACES STOOL CULTURE)  C DIFFICILE QUICK SCREEN W PCR REFLEX    URINALYSIS, ROUTINE W REFLEX MICROSCOPIC  GASTRIN  COMPREHENSIVE METABOLIC PANEL WITH GFR  LIPASE, BLOOD    EKG: EKG Interpretation Date/Time:  Friday October 13 2023 02:55:50 EDT Ventricular Rate:  71 PR Interval:  147 QRS Duration:  93 QT Interval:  470 QTC Calculation:  511 R Axis:   70  Text Interpretation: Sinus rhythm Biatrial enlargement Nonspecific T abnormalities, lateral leads Prolonged QT interval No significant change was found Confirmed by Carita Senior [phone]) on 10/13/2023 2:58:56 AM  Radiology: No results found.  Procedures   Medications Ordered in the ED  sodium chloride  0.9 % bolus 1,000 mL (1,000 mLs Intravenous New Bag/Given 10/13/23 0336)  trimethobenzamide  (TIGAN ) injection 200 mg (200 mg Intramuscular Given 10/13/23 0340)  LORazepam  (ATIVAN ) injection 0.5 mg (0.5 mg Intravenous Given 10/13/23 0537)    Medical Decision Making Amount and/or Complexity of Data Reviewed Labs: ordered. ECG/medicine tests: ordered.  Risk Prescription drug management.   This is a 78 year old male presenting to the ED out of concern of nausea and vomiting.  On exam, the patient is hemodynamically stable.  He is afebrile and nontachycardic.  His lung sounds are clear bilaterally, there is no hypoxia.  Abdomen is soft and compressible with vague tenderness.  Neurological examination at baseline.  Patient recommended shaded with CBC, CMP, lipase, urinalysis, CT abdomen pelvis.  Patient given 1 L of fluid, Tigan  for nausea.  After Tigan  given, patient continues to complain of nausea.  0.5 mg Ativan  given.  Patient does have prolonged QT on EKG.  Patient labs apparently hemolyzed and they were sent down initially.  Patient labs redrawn.  Patient labs have not resulted at end of shift. Signed out to oncoming provider [employer]. Plan of  management discussed.   Final diagnoses:  Nausea vomiting and diarrhea    ED Discharge Orders     None          Ruthell Lonni JULIANNA DEVONNA 10/13/23 [id number]    Carita Senior, MD 10/13/23 [phone]

## 2023-10-13 NOTE — ED Triage Notes (Signed)
 Pt has been having n/v for the past 2 days. Seen here last week for the same, given fluids and got better but symptoms have returned. Some diarrhea yesterday

## 2023-10-13 NOTE — H&P (Signed)
 History and Physical    Patient: Juan Torres FMW:[id number] DOB: Jun 29, 1945 DOA: 10/13/2023 DOS: the patient was seen and examined on 10/13/2023 PCP: Joshua Debby CROME, MD  Patient coming from: Home  Chief Complaint:  Chief Complaint  Patient presents with   Emesis   HPI: Juan Torres is a 78 y.o. male with medical history significant of colon polyps, anxiety, depression, osteoarthritis of the feet, COPD, familial tremor, GERD, headaches, basal cell cancer, hyperlipidemia, hypertension, prediabetes, prostate cancer, pulmonary nodules, sleep apnea not on CPAP, vitamin D  deficiency, nausea/vomiting and diarrhea who presented to the emergency department with complaints of having nausea and multiple episodes of emesis for the past 6 days.  He also has some episodes of diarrhea yesterday.  He thinks that this was due to eating pizza.  He told me that his wife had similar symptoms which had already resolved, but his wife denied having any N/V/D.  Around the same time, he also hit his head in the bathtub.   No constipation, melena or hematochezia.  No flank pain, dysuria, frequency or hematuria. He denied fever, chills, rhinorrhea, sore throat, wheezing or hemoptysis.  No chest pain, palpitations, diaphoresis, PND, orthopnea or pitting edema of the lower extremities.  No polyuria, polydipsia, polyphagia or blurred vision.   Lab work: CBC showed a white count of 15.8, hemoglobin 18.0 g/dL platelets 736.  Lipase was 115 units/L.  Lactic acid 2.1 mmol/L.  Venous blood gas showed a pH of 7.46, normal pCO2 and pO2, bicarbonate of 32.0 and acid base excess of 6.9 mmol/L.  CMP showed a chloride of 89 mmol/L, glucose 155, BUN 43, creatinine 2.40 and calcium  10.6 mg/dL after correction.  The rest of the electrolytes and LFTs were normal.  Imaging: CT renal stone study showed mild dilatation and fluid filling of the mid to lower abdominal small bowel up to 2.7 cm.  Transition point not seen by that are normal caliber  segments in the right lower quadrant below the second.  This could be an ileus or a low-grade partial SBO.  Moderate irregular fold thickening in the body and antrum of the stomach, seen previously.  Moderate circumferential thickening of the distal thoracic esophagus.  Correlate with the latest EGD results.  There is a 3.5 x 2.7 cm solid mass arising from the uncinate process of the pancreas, unchanged since 10/07/2023, but was a little smaller 3.3 x 2.3 cm on 07/12/2023.  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  Aortic atherosclerosis.  Prostate brachytherapy.  Left inguinal fat hernia.  Right inguinal herniorrhaphy.   ED course: Initial vital signs were temperature 97.5 F, pulse 85, respirations 16, BP 149/91 mmHg O2 sat 100% on room air.  Patient received 1000 mL of normal saline bolus, Tigan  200 mg IM and lorazepam  0.5 mg IVP.  Review of Systems: As mentioned in the history of present illness. All other systems reviewed and are negative. Past Medical History:  Diagnosis Date   Adenomatous colon polyp    Anxiety    Arthritis    FEET   COPD (chronic obstructive pulmonary disease) (HCC)    Depression    Familial tremor    GERD (gastroesophageal reflux disease)    Headache    When younger   History of basal cell cancer 2012   Hyperlipidemia    Hypertension    Nausea vomiting and diarrhea 09/06/2023   Prediabetes    Prostate cancer (HCC) DX 12/19/11   bx=Adenocarcinoma,gleason=3+3=6,volume=42cc,PSA=4.63   PTSD (post-traumatic  stress disorder)    Pulmonary nodules 12/10/2018   Numerous unchanged/benign appearing per CT lung 12/2018   Sleep apnea    no cpap   Vitamin D  deficiency    Past Surgical History:  Procedure Laterality Date   COLONOSCOPY W/ POLYPECTOMY     Adenomatous   CYSTOSCOPY N/A 04/04/2012   Procedure: CYSTOSCOPY;  Surgeon: Toribio Neysa Repine, MD;  Location[facility];  Service: Urology;  Laterality: N/A;    ESOPHAGOGASTRODUODENOSCOPY N/A 09/04/2023   Procedure: EGD (ESOPHAGOGASTRODUODENOSCOPY);  Surgeon: Wilhelmenia Aloha Raddle., MD;  Location: [facility];  Service: Gastroenterology;  Laterality: N/A;   EUS N/A 09/04/2023   Procedure: ULTRASOUND, UPPER GI TRACT, ENDOSCOPIC;  Surgeon: Wilhelmenia Aloha Raddle., MD;  Location: [facility];  Service: Gastroenterology;  Laterality: N/A;   EYE SURGERY     bilateral cataracts    FINE NEEDLE ASPIRATION  09/04/2023   Procedure: FINE NEEDLE ASPIRATION;  Surgeon: Wilhelmenia Aloha Raddle., MD;  Location: [facility];  Service: Gastroenterology;;   INGUINAL HERNIA REPAIR Bilateral 10/18/2018   Procedure: LAPAROSCOPIC BILATERAL INGUINAL HERNIA REPAIR;  Surgeon: Gladis Cough, MD;  Location: [facility];  Service: General;  Laterality: Bilateral;   INGUINAL HERNIA REPAIR Right 04/25/2023   Procedure: OPEN RIGHT INGUINAL HERNIA REPAIR WITH MESH;  Surgeon: Signe Mitzie LABOR, MD;  Location: [facility];  Service: General;  Laterality: Right;   PROSTATE BIOPSY  12/19/2011   Procedure: BIOPSY TRANSRECTAL ULTRASONIC PROSTATE (TUBP);  Surgeon: Toribio Neysa Repine, MD;  Location[facility];  Service: Urology;  Laterality: N/A;     RADIOACTIVE SEED IMPLANT N/A 04/04/2012   Procedure: RADIOACTIVE SEED IMPLANT;  Surgeon: Toribio Neysa Repine, MD;  Location: [facility];  Service: Urology;  Laterality: N/A;  Seeds Implanted     72  Seeds Found in Bladder     None    Social History:  reports that he has been smoking cigarettes and cigars. He started smoking about 34 years ago. He has a 34.8 pack-year smoking history. He has never used smokeless tobacco. He reports current alcohol use of about 1.0 standard drink of alcohol per week. He reports that he does not currently use drugs after having used the following drugs: Marijuana. Frequency: 7.00 times per week.  Allergies  Allergen Reactions   Lipitor [Atorvastatin] Other (See Comments)    fatigued    Niacin And Related Other (See Comments)    Flushing   Penicillins Rash    Family History  Problem Relation Age of Onset   Breast cancer Mother 2   Heart attack Father 72   Dementia Father    Prostate cancer Maternal Uncle        prostate   Ulcers Maternal Grandmother    Cancer Maternal Grandfather        colon   Prostate cancer Cousin        prostate cancer 1st maternal    Sleep apnea Neg Hx     Prior to Admission medications   Medication Sig Start Date End Date Taking? Authorizing Provider  acetaminophen  (TYLENOL ) 325 MG tablet Take 2 tablets (650 mg total) by mouth every 6 (six) hours as needed for mild pain (pain score 1-3) or fever (or Fever >/= 101). 09/07/23   Sebastian Toribio GAILS, MD  Cholecalciferol  (VITAMIN D3) 125 MCG (5000 UT) TABS Take 10,000 Units by mouth every morning.    [provider]  cyanocobalamin  (VITAMIN B12) 500 MCG tablet Take 1 tablet (500 mcg total) by mouth daily.  07/24/23   Arlon Carliss ORN, DO  ezetimibe  (ZETIA ) 10 MG tablet Take 1 tablet (10 mg total) by mouth daily for cholesterol. 08/08/22   Wilkinson, Dana E, NP  fenofibrate  micronized (LOFIBRA) 134 MG capsule Take 1 capsule (134 mg total) by mouth daily (blood fats). 06/24/22   Wilkinson, Dana E, NP  folic acid  (FOLVITE ) 1 MG tablet Take 1 tablet (1 mg total) by mouth daily. 07/24/23   Arlon Carliss ORN, DO  loperamide  (IMODIUM  A-D) 2 MG tablet Take 1 tablet (2 mg total) by mouth 4 (four) times daily as needed for diarrhea or loose stools. 09/07/23   Sebastian Toribio GAILS, MD  LORazepam  (ATIVAN ) 1 MG tablet Take 0.5 tablets (0.5 mg total) by mouth 2 (two) times daily as needed for up to 4 doses (nausea). 10/07/23   Rogelia Jerilynn RAMAN, MD  magnesium  oxide (MAG-OX) 400 (240 Mg) MG tablet Take 1 tablet (400 mg total) by mouth every evening. 06/22/23   Joshua Debby CROME, MD  Multiple Vitamin (MULTIVITAMIN WITH MINERALS) TABS tablet Take 1 tablet by mouth daily. 07/24/23   Arlon Carliss ORN, DO  nicotine  (NICODERM  CQ - DOSED IN MG/24 HOURS) 14 mg/24hr patch Place 1 patch (14 mg total) onto the skin daily. 07/24/23   Arlon Carliss ORN, DO  Omega-3 Fatty Acids (FISH OIL) 1000 MG CAPS Take 1,000 mg by mouth every morning.    [provider]  ondansetron  (ZOFRAN -ODT) 4 MG disintegrating tablet Take 1 tablet (4 mg total) by mouth every 8 (eight) hours as needed for nausea or vomiting. 09/07/23   Sebastian Toribio GAILS, MD  pantoprazole  (PROTONIX ) 40 MG tablet Take 1 tablet (40 mg total) by mouth 2 (two) times daily. 09/07/23   Sebastian Toribio GAILS, MD  propranolol  ER (INDERAL  LA) 120 MG 24 hr capsule Take 1 capsule (120 mg total) by mouth daily. For blood pressure and tremor 06/16/23   Joshua Debby CROME, MD  rosuvastatin  (CRESTOR ) 40 MG tablet Take 1 tablet (40 mg total) by mouth daily for cholesterol 07/14/23   Joshua Debby CROME, MD  sucralfate  (CARAFATE ) 1 GM/10ML suspension Take 10 mLs (1 g total) by mouth 4 (four) times daily for 30 days, THEN 10 mLs (1 g total) 2 (two) times daily. 09/07/23 11/06/23  Sebastian Toribio GAILS, MD  thiamine  (VITAMIN B-1) 100 MG tablet Take 1 tablet (100 mg total) by mouth daily. 07/24/23   Arlon Carliss ORN, DO    Physical Exam: Vitals:   10/13/23 0244 10/13/23 0245 10/13/23 0550 10/13/23 0615  BP: (!) 149/91  (!) 145/83 (!) 177/102  Pulse: 85  77 73  Resp: 16  19 16   Temp:  (!) 97.5 F (36.4 C)  98.4 F (36.9 C)  TempSrc:  Oral  Oral  SpO2: 100%  95% 98%   Physical Exam Vitals and nursing note reviewed.  Constitutional:      General: He is awake. He is not in acute distress.    Appearance: Normal appearance. He is ill-appearing.  HENT:     Head: Normocephalic.     Nose: No rhinorrhea.     Mouth/Throat:     Mouth: Mucous membranes are dry.  Eyes:     General: No scleral icterus.    Pupils: Pupils are equal, round, and reactive to light.  Neck:     Vascular: No JVD.  Cardiovascular:     Rate and Rhythm: Normal rate and regular rhythm.     Heart sounds: S1 normal and S2  normal.  Pulmonary:     Effort: Pulmonary effort is normal.     Breath sounds: Normal breath sounds. No wheezing, rhonchi or rales.  Abdominal:     General: Bowel sounds are normal. There is no distension.     Palpations: Abdomen is soft.     Tenderness: There is no abdominal tenderness. There is no right CVA tenderness or left CVA tenderness.  Musculoskeletal:     Cervical back: Neck supple.     Right lower leg: No edema.     Left lower leg: No edema.  Skin:    General: Skin is warm and dry.  Neurological:     General: No focal deficit present.     Mental Status: He is alert and oriented to person, place, and time.  Psychiatric:        Mood and Affect: Mood normal.        Behavior: Behavior normal. Behavior is cooperative.     Data Reviewed:  Results are pending, will review when available. 07/19/2022 echocardiogram report. IMPRESSIONS:   1. Left ventricular ejection fraction, by estimation, is 60 to 65%. The  left ventricle has normal function. The left ventricle has no regional  wall motion abnormalities. There is mild left ventricular hypertrophy.  Left ventricular diastolic parameters  were normal.   2. Right ventricular systolic function is normal. The right ventricular  size is normal.   3. The mitral valve is grossly normal. Trivial mitral valve  regurgitation. No evidence of mitral stenosis.   4. The aortic valve is tricuspid. There is mild calcification of the  aortic valve. Aortic valve regurgitation is not visualized. Aortic valve  sclerosis is present, with no evidence of aortic valve stenosis.   5. The inferior vena cava is normal in size with greater than 50%  respiratory variability, suggesting right atrial pressure of 3 mmHg.   Comparison(s): No prior Echocardiogram.   Conclusion(s)/Recommendation(s): Normal biventricular function without  evidence of hemodynamically significant valvular heart disease.   EKG: Vent. rate 71 BPM PR interval 147  ms QRS duration 93 ms QT/QTcB 470/511 ms P-R-T axes 81 70 74 Sinus rhythm Biatrial enlargement Nonspecific T abnormalities, lateral leads Prolonged QT interval  Assessment and Plan: Principal Problem:   AKI (acute kidney injury) (HCC) Superimposed on:   CKD (chronic kidney disease) stage 3a Observation/telemetry. Continue IV fluids. Avoid hypotension. Avoid nephrotoxins. Monitor intake and output. Monitor renal function electrolytes.  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 No significant growth since 2018.    GERD (gastroesophageal reflux disease) Continue pantoprazole  40 mg IVP every 24 hours.    Hyperlipidemia, mixed Continue ezetimibe  10 mg p.o. daily. Continue rosuvastatin  40 mg p.o. daily. Continue omega-3 fish oils 1000 mg p.o. daily.    Essential hypertension Continue propranolol  80 mg p.o. daily.    Emphysema lung (HCC) Bronchodilators and supplemental oxygen as needed.    OSA on CPAP Hold CPAP due to ileus or partial SBO.    QT prolongation Avoid QT prolonging meds as possible. Magnesium  sulfate 2 g IVPB now. Keep electrolytes optimized. Check EKG in the morning.     Advance Care Planning:   Code Status: Full Code   Consults:   Family Communication: Spoke to his wife via telephone.  Severity of Illness: The appropriate patient status for this patient is OBSERVATION. Observation status is judged to be reasonable and necessary in order to provide the required intensity of service to ensure the patient's safety. The patient's presenting symptoms, physical exam findings,  and initial radiographic and laboratory data in the context of their medical condition is felt to place them at decreased risk for further clinical deterioration. Furthermore, it is anticipated that the patient will be medically stable for discharge from the hospital within 2 midnights of admission.   Author: Alm Dorn Castor, MD 10/13/2023 8:18 AM  For on  call review [email or web address].   This document was prepared using Dragon voice recognition software and may contain some unintended transcription errors.

## 2023-10-13 NOTE — Plan of Care (Signed)

## 2023-10-13 NOTE — ED Notes (Signed)
 Patient states he will try and produce some urine    he ha a cup of ice chips right now

## 2023-10-13 NOTE — Telephone Encounter (Signed)
 Thank you for update.  Patient to be seen by APP.  EGD recall reasonable.  Looks like he is in the hospital with a small bowel obstruction, so probably ideal just to wait till clinic evaluation before scheduling. Thanks. GM

## 2023-10-13 NOTE — Telephone Encounter (Signed)
 The pt wife has been advised that the pt is ok to keep appt as planned with PA- he is due for repeat procedure in November.  No further questions at this time.  Dr Wilhelmenia the pt is currently in the ED for vomiting.  The pt wife would like you to see that a gastrin level was drawn today.

## 2023-10-13 NOTE — ED Provider Notes (Signed)
  Accepted handoff at shift change from Groce PA-C. Please see prior provider note for more detail.   Briefly: Patient is 78 y.o.  Patient presents to ED for evaluation of nausea and vomiting.  States that he was seen for this issue on 8/30.  Had reassuring workup and was discharged with Ativan  for nausea.  Patient was given Ativan  due to prolonged QT.  Reports that he was doing fine at home until Wednesday when he began having nausea and vomiting.  Reports that on Wednesday he ate 1 piece of pizza as well as some barbecue ribs.  Reports began having nausea after this.  Reports he has had persistent nausea since Wednesday.  Tried taking at home Ativan  without relief.  Denies any abdominal pain, fevers, dysuria, chest pain, shortness of breath.  States that he did have some diarrhea today multiple times but there was no blood in his diarrhea.  Reports multiple episodes of nausea and vomiting for the last 2 days but denies any blood in his vomit.  Recently found to have pancreatic mass currently being worked up by GI as well as general surgery.  Denies any recent alcohol use.  Denies recent antibiotics, travel.   Patient reports to me that he has had nausea and vomiting every day for the last 5 days.  Wife at bedside unsure of this.  Patient adamant that he has been throwing up every day   Plan:  - Dispo pending lab and imaging workup. - BMP showing AKI with BUN/Cr at 43/2.40. there is also an anion gap at 29. CBC with leukocytosis at 15.8. VBG with elevated pH at 7.46. Lipase elevated at 115.  - CT scan showing patient's pancreas tumor and also possible ileus vs partial SBO. - admitting patient for AKI, acidosis, and possible ileus vs partial SBO. - Consulted with General Surgery provider on-call, Michael Maczis who recommends NG tube given that patient has been vomiting for 6 days. They will evaluate patient in ED. - consulted with Hospitalist on-call Dr. Celinda who agrees to admit patient.      .Critical Care  Performed by: Hoy Nidia FALCON, PA-C Authorized by: Hoy Nidia FALCON, PA-C   Critical care provider statement:    Critical care time (minutes):  30   Critical care was necessary to treat or prevent imminent or life-threatening deterioration of the following conditions:  Renal failure   Critical care was time spent personally by me on the following activities:  Development of treatment plan with patient or surrogate, discussions with consultants, evaluation of patient's response to treatment, examination of patient, ordering and review of laboratory studies, ordering and review of radiographic studies, ordering and performing treatments and interventions, pulse oximetry, re-evaluation of patient's condition and review of old charts   Care discussed with: admitting provider   Comments:     AKI and partial small bowel obstruction     Hoy Nidia FALCON, PA-C 10/13/23 0827    Carita Senior, MD 10/13/23 2111

## 2023-10-13 NOTE — Consult Note (Signed)
 Koah Chisenhall Ciullo [birth year]  [id number].    Requesting MD: Nidia Mays, PA-C Chief Complaint/Reason for Consult: SBO  HPI: NATALE THOMA is a 78 y.o. 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 Leonor Dawn and Dr. Lanny who presented to the ED with abdominal pain, n/v.   Patient reports he had n/v/d last weekend. He went to the ED and was evaluated w/ CT scan that showed below.   1. Moderate wall thickening of the included distal esophagus with mild adjacent soft tissue edema, suspicious for esophagitis. 2. Irregular wall thickening of the distal stomach. Differential considerations include gastritis, peptic ulcer disease, and neoplasm. Recommend correlation with recent EGD. 3. Fluid/liquid stool throughout the colon with colonic air-fluid levels, can be seen with diarrheal illness. No bowel obstruction. 4. 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 5. Trace right pleural effusion.  Patient reports his symptoms improved and he returned home with resolution of n/v. He was able to eat pizza on Monday without issues but did not have much of an appetite the following days. 2 days ago, on Wednesday, he was headed to the bathroom with urgency as he felt he was going to have diarrhea when he fell and hit his left forehead on the bathtub. He denies loc. No HA, visual changes, dizziness, n/t/w of the extremities. He had an episode of diarrhea that was non-bloody and since has not had any flatus or BM. He developed n/v later that evening without associated abdominal pain or distension. Vomiting was non-bloody with last episode last night. He presented to the ED for persistent nausea and no bowel function since Wednesday night. In the ED he received anti-nausea medication with relief of his symptoms. He reports no current nausea or  abdominal pain. His workup was notable for an AKI and possible SBO. He was admitted and general surgery was asked to see.   He is currently afebrile without tachycardia or hypotension. WBC 15.8. Cr 2.4 (1.3 on 8/30). Lactic acid 2.1 --> 1.9. CT w/ mild dilatation and fluid filling in the mid to lower abdominal small bowel up to 2.7 cm with exact transition not seen but there are small caliber segments in the right lower quadrant below the cecum. He continues to have moderate circumferential thickening of the distal esophagus, pancreatic mass that is unchanged from CT on 8/30 and a left inguinal hernia containing fat.   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 He last saw Dr. Dawn for this on 8/26. At that time they planned to obtain serum gastrin level after 7 days off his PPI to evaluate erosions and ulcerations noted on EGD from his esophagus to the duodenum to evaluate if this is a gastrin-secreting tumor. Dr. Dawn discussed with the patient option of [facility] at his last appointment with plan to call him to discuss final plan and recommendations pending MDC discussion and gastrin level results. Patient was scheduled to obtain gastrin level today. He has already received a PPI prior to me seeing him.   He has a history of laparoscopic BIH repair w/ 3D max mesh by Dr. Gladis in 2020 and Open right inguinal hernia repair with 3 x 6 piece of ultra Pro mesh by Dr. Signe on 04/25/2023. He is not on blood thinners.  ROS: ROS As above, see hpi  Family History  Problem Relation Age of Onset   Breast cancer Mother 90   Heart attack Father 90   Dementia Father    Prostate cancer Maternal Uncle        prostate   Ulcers Maternal Grandmother    Cancer Maternal Grandfather        colon   Prostate cancer Cousin        prostate cancer 1st maternal    Sleep apnea Neg Hx     Past Medical History:  Diagnosis Date   Adenomatous colon polyp    Anxiety    Arthritis     FEET   COPD (chronic obstructive pulmonary disease) (HCC)    Depression    Familial tremor    GERD (gastroesophageal reflux disease)    Headache    When younger   History of basal cell cancer 2012   Hyperlipidemia    Hypertension    Nausea vomiting and diarrhea 09/06/2023   Prediabetes    Prostate cancer (HCC) DX 12/19/11   bx=Adenocarcinoma,gleason=3+3=6,volume=42cc,PSA=4.63   PTSD (post-traumatic stress disorder)    Pulmonary nodules 12/10/2018   Numerous unchanged/benign appearing per CT lung 12/2018   Sleep apnea    no cpap   Vitamin D  deficiency     Past Surgical History:  Procedure Laterality Date   COLONOSCOPY W/ POLYPECTOMY     Adenomatous   CYSTOSCOPY N/A 04/04/2012   Procedure: CYSTOSCOPY;  Surgeon: Toribio Neysa Repine, MD;  Location: [facility];  Service: Urology;  Laterality: N/A;   ESOPHAGOGASTRODUODENOSCOPY N/A 09/04/2023   Procedure: EGD (ESOPHAGOGASTRODUODENOSCOPY);  Surgeon: Wilhelmenia Aloha Raddle., MD;  Location: [facility];  Service: Gastroenterology;  Laterality: N/A;   EUS N/A 09/04/2023   Procedure: ULTRASOUND, UPPER GI TRACT, ENDOSCOPIC;  Surgeon: Wilhelmenia Aloha Raddle., MD;  Location: [facility];  Service: Gastroenterology;  Laterality: N/A;   EYE SURGERY     bilateral cataracts    FINE NEEDLE ASPIRATION  09/04/2023   Procedure: FINE NEEDLE ASPIRATION;  Surgeon: Wilhelmenia Aloha Raddle., MD;  Location: [facility];  Service: Gastroenterology;;   INGUINAL HERNIA REPAIR Bilateral 10/18/2018   Procedure: LAPAROSCOPIC BILATERAL INGUINAL HERNIA REPAIR;  Surgeon: Gladis Cough, MD;  Location: [facility];  Service: General;  Laterality: Bilateral;   INGUINAL HERNIA REPAIR Right 04/25/2023   Procedure: OPEN RIGHT INGUINAL HERNIA REPAIR WITH MESH;  Surgeon: Signe Mitzie LABOR, MD;  Location: [facility];  Service: General;  Laterality: Right;   PROSTATE BIOPSY  12/19/2011   Procedure: BIOPSY TRANSRECTAL ULTRASONIC PROSTATE (TUBP);  Surgeon: Toribio Neysa Repine, MD;  Location: [facility];  Service: Urology;  Laterality: N/A;     RADIOACTIVE SEED IMPLANT N/A 04/04/2012   Procedure: RADIOACTIVE SEED IMPLANT;  Surgeon: Toribio Neysa Repine, MD;  Location: [facility];  Service: Urology;  Laterality: N/A;  Seeds Implanted     72  Seeds Found in Bladder     None     Social History:  reports that he has been smoking cigarettes and cigars. He started smoking about 34 years ago. He has a 34.8 pack-year smoking history. He has never used smokeless tobacco. He reports current alcohol use of about 1.0 standard drink of alcohol per week. He reports that he does not currently use drugs after having used the following drugs: Marijuana. Frequency: 7.00 times per week.  Allergies:  Allergies  Allergen Reactions   Lipitor [Atorvastatin] Other (See Comments)    fatigued   Niacin  And Related Other (See Comments)    Flushing   Penicillins Rash    (Not in a hospital admission)    Physical Exam: Blood pressure (!) 177/102, pulse 73, temperature 98.4 F (36.9 C), temperature source Oral, resp. rate 16, SpO2 98%. General: pleasant, WD/WN male who is laying in bed in NAD HEENT: Left forehead abrasion. Sclera are non-icteric. Ears and nose without any obvious masses or lesions.  Mouth is pink and moist. Dentition fair Heart: regular, rate, and rhythm.   Lungs: Respiratory effort nonlabored Abd: Soft, ND, almost scaphoid abdomen, NT. No masses, hernias, or organomegaly appreciated.  MS: no BUE or BLE edema Skin: warm and dry  Psych: A&Ox4 with an appropriate affect Neuro: normal speech, CN 3-12 grossly intact, thought process intact, moves all extremities, BUE and BLE strength equal and appropriate, SILT to BUE and BLE and equal, gait not assessed   Results for orders placed or performed during the hospital encounter of 10/13/23 (from the past 48 hours)  CBC     Status: Abnormal   Collection Time: 10/13/23  3:07 AM   Result Value Ref Range   WBC 15.8 (H) 4.0 - 10.5 K/uL   RBC 5.28 4.22 - 5.81 MIL/uL   Hemoglobin 18.0 (H) 13.0 - 17.0 g/dL   HCT 48.9 60.9 - 47.9 %   MCV 96.6 80.0 - 100.0 fL   MCH 34.1 (H) 26.0 - 34.0 pg   MCHC 35.3 30.0 - 36.0 g/dL   RDW 88.4 88.4 - 84.4 %   Platelets 263 150 - 400 K/uL   nRBC 0.0 0.0 - 0.2 %    Comment: Performed at [facility], [address], [place], [place] [address]  Comprehensive metabolic panel with GFR     Status: Abnormal   Collection Time: 10/13/23  4:15 AM  Result Value Ref Range   Sodium 141 135 - 145 mmol/L   Potassium 3.6 3.5 - 5.1 mmol/L   Chloride 89 (L) 98 - 111 mmol/L   CO2 23 22 - 32 mmol/L   Glucose, Bld 155 (H) 70 - 99 mg/dL    Comment: Glucose reference range applies only to samples taken after fasting for at least 8 hours.   BUN 43 (H) 8 - 23 mg/dL   Creatinine, Ser 7.59 (H) 0.61 - 1.24 mg/dL   Calcium  10.4 (H) 8.9 - 10.3 mg/dL   Total Protein 6.8 6.5 - 8.1 g/dL   Albumin 4.3 3.5 - 5.0 g/dL   AST 21 15 - 41 U/L    Comment: HEMOLYSIS AT THIS LEVEL MAY AFFECT RESULT   ALT 10 0 - 44 U/L   Alkaline Phosphatase 51 38 - 126 U/L   Total Bilirubin 1.0 0.0 - 1.2 mg/dL   GFR, Estimated 27 (L) >60 mL/min    Comment: (NOTE) Calculated using the CKD-EPI Creatinine Equation (2021)    Anion gap 29 (H) 5 - 15    Comment: Performed at [facility], [address], [place], [place] [address]  Lipase, blood     Status: Abnormal   Collection Time: 10/13/23  4:15 AM  Result Value Ref Range   Lipase 115 (H) 11 - 51 U/L    Comment: Performed at [facility], [address], [place], [place] [address]  Blood gas, venous (at [facility])     Status: Abnormal   Collection Time: 10/13/23  7:20 AM  Result Value Ref Range   pH, Ven 7.46 (H) 7.25 -  7.43   pCO2, Ven 45 44 - 60 mmHg   pO2, Ven 32 32 - 45 mmHg   Bicarbonate 32.0 (H) 20.0 - 28.0 mmol/L   Acid-Base Excess 6.9 (H) 0.0 - 2.0 mmol/L   O2  Saturation 51.1 %   Patient temperature 37.0     Comment: Performed at [facility], [address], [place], [place] [address]   CT Renal Stone Study Result Date: 10/13/2023 CLINICAL DATA:  Abdominal/flank pain.  Stone suspected. EXAM: CT ABDOMEN AND PELVIS WITHOUT CONTRAST TECHNIQUE: Multidetector CT imaging of the abdomen and pelvis was performed following the standard protocol without IV contrast. RADIATION DOSE REDUCTION: This exam was performed according to the departmental dose-optimization program which includes automated exposure control, adjustment of the mA and/or kV according to patient size and/or use of iterative reconstruction technique. COMPARISON:  CT with IV contrast 10/07/2023, MRI abdomen without and with contrast 07/12/2023. FINDINGS: Lower chest: Moderate circumferential thickening of the distal thoracic esophagus. Last EGD was 09/04/2023. Correlate with EGD results. There is a small anterior pericardial effusion, increased. The cardiac size is normal. 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 Hepatobiliary: Small scattered hepatic cysts. No suspicious lesion. The liver mildly steatotic. Unremarkable gallbladder and bile ducts. 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 There is a small central calcification in the mass. 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 Certainly adenocarcinoma is not excluded. Rest of the pancreas unremarkable aside from being partially atrophic. Spleen: No abnormality. Adrenals/Urinary Tract: No adrenal mass. No contour deforming abnormality of the unenhanced kidneys. There is no urinary stone or obstruction. Unremarkable bladder for the degree of distention. Stomach/Bowel: Moderate irregular fold thickening in  the body and antrum of stomach, seen previously. The upper abdominal small bowel is unremarkable. There is mild dilatation and fluid filling in the mid to lower abdominal small bowel up to 2.7 cm. The exact transition to decompressed caliber large not seen but there are small caliber segments in the right lower quadrant below the cecum. Findings could be due to an ileus or a low-grade partial small bowel obstruction. The appendix is normal. There are no just medication tablets of the sacrum, fluid in the large bowel. No wall thickening or dilatation in the large bowel. Vascular/Lymphatic: Aortic atherosclerosis. No enlarged abdominal or pelvic lymph nodes. Reproductive: Prostate brachytherapy.  No mass in the prostate bed. Other: Left inguinal fat hernia. No incarcerated hernia. No free fluid or free air. Postsurgical change low anterior pelvic wall. Right inguinal hernia repair. Musculoskeletal: Osteopenia and degenerative change lumbar spine, degenerative change most advanced at L5-S1 no worrisome regional bone lesion. IMPRESSION: 1. Mild dilatation and fluid filling of the mid to lower abdominal small bowel up to 2.7 cm. 2. The exact transition to decompressed caliber is not seen but there are normal caliber segments in the right lower quadrant below the cecum. Findings could be due to an ileus or a low-grade partial small bowel obstruction. 3. Moderate irregular fold thickening in the body and antrum of the stomach, seen previously. 4. Moderate circumferential thickening of the distal thoracic esophagus. Correlate with latest EGD results. 5. 3.5 x 2.7 cm solid mass arising from the uncinate process of the pancreas, unchanged since 10/07/2023, but on 07/12/2023 was 3.3 x 2.3 cm although has  been seen on lung cancer screening chest CTs as far back as 2018. 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 6.  Aortic atherosclerosis. 7. Prostate brachytherapy. 8. Left inguinal fat hernia.  Right inguinal herniorrhaphy. Aortic Atherosclerosis (ICD10-I70.0). Electronically Signed   By: Francis Quam M.D.   On: 10/13/2023 07:38    Anti-infectives (From admission, onward)    None       Assessment/Plan AL BRACEWELL is a 78 y.o. 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 Leonor Dawn and Dr. Lanny who presented to the ED with abdominal pain, n/v with no bowel function for 2 days and his CT scan showed possible SBO w/ mid to lower abdominal small bowel up to 2.7 cm with exact transition not seen but there are small caliber segments in the right lower quadrant below the cecum. He is currently afebrile without tachycardia or hypotension. WBC 15.8. Cr 2.4 (1.3 on 8/30). Lactic acid 2.1 --> 1.9. He reports his n/v has resolved since presentation and on exam he has a scaphoid abdomen that is NT. He has a history of laparoscopic BIH repair w/ 3D max mesh by Dr. Gladis in 2020 and Open right inguinal hernia repair with 3 x 6 piece of ultra Pro mesh by Dr. Signe on 04/25/2023.   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 Will review imaging with attending to ensure they agree - With no abdominal pain and resolution of n/v w/ benign exam I think we can hold off on NGT. If he develops any n/v would recommend NGT placement. Keep NPO.  - Will do SBO protocol orally with PO gastrografin  - Keep K >=4, Phos >= 3, Mg >= 2 and mobilize for bowel function - Noted [facility] is also getting CTH given he developed n/v after fall with head trauma. Agree with this.  - We will follow with you  FEN - NPO, IVF per [facility] VTE - SCDs, okay for chem ppx from a general surgery standpoint if CTH negative ID - None  indicated  Neuroendocrine tumor of the head of the pancreas followed by Dr. Leonor Dawn and Dr. Lanny - patient was scheduled for gastrin level to be drawn today. He has already received PPI here before I saw him so unable to get this done inpatient. AKI  Hx COPD Hx HTN Hx HLD Hx CKD 3b Hx OSA Hx prostate cancer tx w/ prostate seed implant    I reviewed nursing notes, ED provider notes, last 24 h vitals and pain scores, last 48 h intake and output, last 24 h labs and trends, and last 24 h imaging results.   Ozell CHRISTELLA Shaper, [facility] Surgery 10/13/2023, 8:21 AM Please see Amion for pager number during day hours 7:00am-4:30pm

## 2023-10-14 DIAGNOSIS — N179 Acute kidney failure, unspecified: Secondary | ICD-10-CM | POA: Diagnosis not present

## 2023-10-14 LAB — COMPREHENSIVE METABOLIC PANEL WITH GFR
ALT: 8 U/L (ref 0–44)
AST: 16 U/L (ref 15–41)
Albumin: 3.4 g/dL — ABNORMAL LOW (ref 3.5–5.0)
Alkaline Phosphatase: 37 U/L — ABNORMAL LOW (ref 38–126)
Anion gap: 13 (ref 5–15)
BUN: 33 mg/dL — ABNORMAL HIGH (ref 8–23)
CO2: 26 mmol/L (ref 22–32)
Calcium: 9.1 mg/dL (ref 8.9–10.3)
Chloride: 100 mmol/L (ref 98–111)
Creatinine, Ser: 1.5 mg/dL — ABNORMAL HIGH (ref 0.61–1.24)
GFR, Estimated: 48 mL/min — ABNORMAL LOW (ref >60)
Glucose, Bld: 84 mg/dL (ref 70–99)
Potassium: 3.8 mmol/L (ref 3.5–5.1)
Sodium: 139 mmol/L (ref 135–145)
Total Bilirubin: 0.8 mg/dL (ref 0.0–1.2)
Total Protein: 5.2 g/dL — ABNORMAL LOW (ref 6.5–8.1)

## 2023-10-14 LAB — CBC
HCT: 38.6 % — ABNORMAL LOW (ref 39.0–52.0)
Hemoglobin: 13.1 g/dL (ref 13.0–17.0)
MCH: 33.6 pg (ref 26.0–34.0)
MCHC: 33.9 g/dL (ref 30.0–36.0)
MCV: 99 fL (ref 80.0–100.0)
Platelets: 207 K/uL (ref 150–400)
RBC: 3.9 MIL/uL — ABNORMAL LOW (ref 4.22–5.81)
RDW: 11.6 % (ref 11.5–15.5)
WBC: 7.6 K/uL (ref 4.0–10.5)
nRBC: 0 % (ref 0.0–0.2)

## 2023-10-14 LAB — GASTRIN: Gastrin: 1595 pg/mL — ABNORMAL HIGH (ref 0–115)

## 2023-10-14 MED ORDER — LACTATED RINGERS IV SOLN: INTRAVENOUS | Status: DC

## 2023-10-14 NOTE — Progress Notes (Signed)
 Subjective No acute events. Feeling well. No complaints, no abdominal pain reported nor n/v. States he is hungry and would like to eat. +Flatus and BM  Objective: Vital signs in last 24 hours: Temp:  [97.5 F (36.4 C)-97.9 F (36.6 C)] 97.8 F (36.6 C) (09/06 0744) Pulse Rate:  [56-74] 65 (09/06 0744) Resp:  [14-18] 14 (09/06 0744) BP: (115-142)/(63-79) 115/63 (09/06 0744) SpO2:  [96 %-100 %] 96 % (09/06 0744) Weight:  [61.8 kg] 61.8 kg (09/05 0945) Last BM Date : 10/11/23  Intake/Output from previous day: 09/05 0701 - 09/06 0700 In: 4346.7 [P.O.:1860; I.V.:1486.7; IV Piggyback:1000] Out: -  Intake/Output this shift: No intake/output data recorded.  Gen: NAD, comfortable CV: RRR Pulm: Normal work of breathing Abd: Soft, NT/ND  Ext: SCDs in place  Lab Results: CBC  Recent Labs    10/13/23 0307 10/14/23 0514  WBC 15.8* 7.6  HGB 18.0* 13.1  HCT 51.0 38.6*  PLT 263 207   BMET Recent Labs    10/13/23 1418 10/14/23 0514  NA 137 139  K 3.2* 3.8  CL 94* 100  CO2 24 26  GLUCOSE 96 84  BUN 42* 33*  CREATININE 1.87* 1.50*  CALCIUM  9.1 9.1   PT/INR No results for input(s): LABPROT, INR in the last 72 hours. ABG Recent Labs    10/13/23 0720  HCO3 32.0*    Studies/Results:  Anti-infectives: Anti-infectives (From admission, onward)    None        Assessment/Plan: Patient Active Problem List   Diagnosis Date Noted   AKI (acute kidney injury) (HCC) 10/13/2023   Neuroendocrine carcinoma (HCC) 09/14/2023   Alcoholism (HCC) 09/14/2023   Primary pancreatic neuroendocrine tumor 09/07/2023   High anion gap metabolic acidosis 09/06/2023   Nausea vomiting and diarrhea 09/06/2023   Erosive esophagitis 09/05/2023   Unintentional weight loss 09/05/2023   Pancreatic lesion 09/04/2023   Abnormal MRI of abdomen 09/04/2023   Bilious vomiting with nausea 09/04/2023   Odynophagia 09/04/2023   Acute esophagitis 09/04/2023   ARF (acute renal failure) (HCC)  09/04/2023   Acute renal failure superimposed on stage 3a chronic kidney disease (HCC) 09/03/2023   Edema of both lower legs 08/24/2023   D-dimer, elevated 08/24/2023   QT prolongation 07/19/2023   Pancreatic mass 07/13/2023   Elevated lipase 06/22/2023   Unexplained weight loss 06/22/2023   Encounter for general adult medical examination with abnormal findings 06/22/2023   Chronic hyperglycemia 06/22/2023   Mild dementia associated with alcoholism, 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   Hypomagnesemia 05/04/2023   Hepatic steatosis 09/18/2019   Familial tremor 07/30/2019   OSA on CPAP 11/05/2018   Thoracic aorta atherosclerosis (HCC) by CT scan  09/12/2018 09/12/2018   Emphysema lung (HCC) 09/12/2018   CKD stage 3a, GFR 45-59 ml/min (HCC) 11/21/2016   Former smoker (quit 12/11/2018, 30+ pack year history) 11/18/2016   BMI 24.0-24.9, adult 12/17/2014   GERD (gastroesophageal reflux disease)    Hyperlipidemia, mixed    Essential hypertension    Vitamin D  deficiency    77yoM with a hx of COPD, HTN, HLD, CKD 3b, OSA, prostate cancer tx w/ prostate seed, neuroendocrine tumor of the pancreas here with suspected adhesive pSBO  SBO protocol has demonstrated contrast clearing the small bowel.  Clinically, appears of also resolved is having bowel function and is now hungry.  His exam is completely benign.  - Liquids, advance to regular diet as tolerated. - All the above is been reviewed with him.  Questions were answered.  He  expressed understanding and agreement with the plan.    LOS: 0 days   I spent a total of 35 minutes in both face-to-face and non-face-to-face activities, excluding procedures performed, for this visit on the date of this encounter.  Lonni Pizza, MD [facility] Surgery, A [facility]

## 2023-10-14 NOTE — Care Management Obs Status (Signed)
 MEDICARE OBSERVATION STATUS NOTIFICATION   Patient Details  Name: Juan Torres MRN: [id number] Date of Birth: 1945-03-08   Medicare Observation Status Notification Given:  Yes    [name], LCSW 10/14/2023, 3:14 PM

## 2023-10-14 NOTE — Plan of Care (Signed)
   Problem: Activity: Goal: Risk for activity intolerance will decrease Outcome: Progressing   Problem: Nutrition: Goal: Adequate nutrition will be maintained Outcome: Progressing

## 2023-10-14 NOTE — Progress Notes (Signed)
   10/14/23 1524  TOC Brief Assessment  Insurance and Status Reviewed  Patient has primary care physician Yes  Home environment has been reviewed home with spouse  Prior level of function: mod independent  Prior/Current Home Services No current home services  Social Drivers of Health Review SDOH reviewed no interventions necessary  Readmission risk has been reviewed Yes  Transition of care needs no transition of care needs at this time

## 2023-10-14 NOTE — Plan of Care (Signed)
   Problem: Health Behavior/Discharge Planning: Goal: Ability to manage health-related needs will improve Outcome: Progressing

## 2023-10-14 NOTE — Progress Notes (Signed)
 PROGRESS NOTE    Juan Torres  FMW:[id number] DOB: 04-Aug-1945 DOA: 10/13/2023 PCP: Joshua Debby CROME, MD    Brief Narrative:  [phone] with a hx of COPD, HTN, HLD, CKD 3b, OSA, prostate cancer tx w/ prostate seed, neuroendocrine tumor of the pancreas here with suspected adhesive pSBO    Assessment and Plan: AKI (acute kidney injury) (HCC) Superimposed on:   CKD (chronic kidney disease) stage 3a Avoid hypotension. Avoid nephrotoxins. -Appears to be back to baseline   pSBO -appears to be resolved - General Surgery consult -Liquids, advance to regular diet as tolerated.  -ambulate    Pancreatic mass Likely neuroendocrine tumor. No significant growth since 2018.     GERD (gastroesophageal reflux disease) Continue pantoprazole       Hyperlipidemia, mixed Continue ezetimibe  10 mg p.o. daily. Continue rosuvastatin  40 mg p.o. daily. Continue omega-3 fish oils 1000 mg p.o. daily.     Essential hypertension Continue propranolol  80 mg p.o. daily.     Emphysema lung (HCC) Bronchodilators and supplemental oxygen as needed.     OSA on CPAP Hold CPAP due to ileus or partial SBO.     QT prolongation Avoid QT prolonging meds as possible.   DVT prophylaxis: enoxaparin  (LOVENOX ) injection 30 mg Start: 10/13/23 2200    Code Status: Full Code Family Communication: At bed side  Disposition Plan:  Level of care: Telemetry Status is: Observation     Consultants:  General Surgery  Subjective: Tolerating liquids  Objective: Vitals:   10/13/23 2115 10/14/23 0039 10/14/23 0555 10/14/23 0744  BP: 128/77 121/78 117/76 115/63  Pulse: 69 70 (!) 56 65  Resp: 16 16 16 14   Temp: 97.7 F (36.5 C) (!) 97.5 F (36.4 C) 97.7 F (36.5 C) 97.8 F (36.6 C)  TempSrc: Oral Oral Oral Oral  SpO2: 97% 98% 97% 96%  Weight:      Height:        Intake/Output Summary (Last 24 hours) at 10/14/2023 1253 Last data filed at 10/14/2023 1201 Gross per 24 hour  Intake 3706.66 ml  Output --  Net  3706.66 ml   Filed Weights   10/13/23 0945  Weight: 61.8 kg    Examination:   General: Appearance:    Thin male in no acute distress     Lungs:     respirations unlabored  Heart:    Normal heart rate.    MS:   All extremities are intact.    Neurologic:   Awake, alert       Data Reviewed: I have personally reviewed following labs and imaging studies  CBC: Recent Labs  Lab 10/07/23 1510 10/13/23 0307 10/14/23 0514  WBC 14.1* 15.8* 7.6  NEUTROABS 11.4*  --   --   HGB 15.7 18.0* 13.1  HCT 44.8 51.0 38.6*  MCV 100.2* 96.6 99.0  PLT 244 263 207   Basic Metabolic Panel: Recent Labs  Lab 10/07/23 1510 10/07/23 2113 10/13/23 0415 10/13/23 1418 10/14/23 0514  NA 140 140 141 137 139  K 3.8 3.3* 3.6 3.2* 3.8  CL 92* 94* 89* 94* 100  CO2 27 28 23 24 26   GLUCOSE 138* 113* 155* 96 84  BUN 18 21 43* 42* 33*  CREATININE 1.44* 1.33* 2.40* 1.87* 1.50*  CALCIUM  10.3 9.3 10.4* 9.1 9.1   GFR: Estimated Creatinine Clearance: 36.1 mL/min (A) (by C-G formula based on SCr of 1.5 mg/dL (H)). Liver Function Tests: Recent Labs  Lab 10/07/23 1510 10/13/23 0415 10/14/23 0514  AST 25 21  16  ALT 11 10 8   ALKPHOS 50 51 37*  BILITOT 1.3* 1.0 0.8  PROT 7.3 6.8 5.2*  ALBUMIN 4.6 4.3 3.4*   Recent Labs  Lab 10/07/23 1510 10/13/23 0415  LIPASE 121* 115*   No results for input(s): AMMONIA in the last 168 hours. Coagulation Profile: No results for input(s): INR, PROTIME in the last 168 hours. Cardiac Enzymes: No results for input(s): CKTOTAL, CKMB, CKMBINDEX, TROPONINI in the last 168 hours. BNP (last 3 results) No results for input(s): PROBNP in the last 8760 hours. HbA1C: No results for input(s): HGBA1C in the last 72 hours. CBG: No results for input(s): GLUCAP in the last 168 hours. Lipid Profile: No results for input(s): CHOL, HDL, LDLCALC, TRIG, CHOLHDL, LDLDIRECT in the last 72 hours. Thyroid  Function Tests: No results for  input(s): TSH, T4TOTAL, FREET4, T3FREE, THYROIDAB in the last 72 hours. Anemia Panel: No results for input(s): VITAMINB12, FOLATE, FERRITIN, TIBC, IRON, RETICCTPCT in the last 72 hours. Sepsis Labs: Recent Labs  Lab 10/07/23 1825 10/07/23 1922 10/13/23 0720 10/13/23 0923  LATICACIDVEN 1.7 1.8 2.1* 1.9    No results found for this or any previous visit (from the past 240 hours).       Radiology Studies: DG Abd Portable 1V-Small Bowel Obstruction Protocol-initial, 8 hr delay Result Date: 10/13/2023 CLINICAL DATA:  8 hour delayed small-bowel film. EXAM: PORTABLE ABDOMEN - 1 VIEW COMPARISON:  CT with IV contrast today at 6:50 a.m. FINDINGS: 10:23 p.m. Still noted is gas distention of small bowel segments up to 3.3 cm, upper to mid abdomen with little if any change. Contrast has cleared from the small bowel except for a small amount in decompressed right lower quadrant segments Contrast is seen throughout the normal caliber colon to the rectum. There is no supine evidence of free air or other significant findings. IMPRESSION: 1. Contrast has cleared from the small bowel except for a small amount in decompressed right lower quadrant segments. 2. Persistent gas distention of small bowel segments upper to mid abdomen up to 3.3 cm. Electronically Signed   By: Francis Quam M.D.   On: 10/13/2023 22:44   CT HEAD WO CONTRAST ( ) Result Date: 10/13/2023 EXAM: CT HEAD WITHOUT CONTRAST 10/13/2023 02:58:32 PM TECHNIQUE: CT of the head was performed without the administration of intravenous contrast. Automated exposure control, iterative reconstruction, and/or weight based adjustment of the mA/kV was utilized to reduce the radiation dose to as low as reasonably achievable. COMPARISON: CT head 07/21/2023 CLINICAL HISTORY: Fall, head trauma, N/V. FINDINGS: BRAIN AND VENTRICLES: No acute hemorrhage. No evidence of acute infarct. No hydrocephalus. No extra-axial collection. No mass effect  or midline shift. ORBITS: Bilateral lens replacement. Chronic deformity of the left lamina papyracea. SINUSES: No acute abnormality. SOFT TISSUES AND SKULL: Heterogeneous sclerotic lesion in the right frontal calvarium is similar to prior and likely benign. There is an additional heterogeneous sclerotic focus involving the left aspect of the clivus which is likely benign. No acute soft tissue abnormality. No skull fracture. VASCULATURE: Atherosclerosis involving the bilateral carotid siphons and intracranial left vertebral artery. IMPRESSION: 1. No acute intracranial abnormality related to the fall and head trauma. Electronically signed by: Donnice Mania MD 10/13/2023 03:05 PM EDT RP Workstation: [id number]   CT Renal Stone Study Result Date: 10/13/2023 CLINICAL DATA:  Abdominal/flank pain.  Stone suspected. EXAM: CT ABDOMEN AND PELVIS WITHOUT CONTRAST TECHNIQUE: Multidetector CT imaging of the abdomen and pelvis was performed following the standard protocol without IV contrast. RADIATION DOSE REDUCTION: This  exam was performed according to the departmental dose-optimization program which includes automated exposure control, adjustment of the mA and/or kV according to patient size and/or use of iterative reconstruction technique. COMPARISON:  CT with IV contrast 10/07/2023, MRI abdomen without and with contrast 07/12/2023. FINDINGS: Lower chest: Moderate circumferential thickening of the distal thoracic esophagus. Last EGD was 09/04/2023. Correlate with EGD results. There is a small anterior pericardial effusion, increased. The cardiac size is normal. 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 Hepatobiliary: Small scattered hepatic cysts. No suspicious lesion. The liver mildly steatotic. Unremarkable gallbladder and bile ducts. 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 There is a small  central calcification in the mass. 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 Certainly adenocarcinoma is not excluded. Rest of the pancreas unremarkable aside from being partially atrophic. Spleen: No abnormality. Adrenals/Urinary Tract: No adrenal mass. No contour deforming abnormality of the unenhanced kidneys. There is no urinary stone or obstruction. Unremarkable bladder for the degree of distention. Stomach/Bowel: Moderate irregular fold thickening in the body and antrum of stomach, seen previously. The upper abdominal small bowel is unremarkable. There is mild dilatation and fluid filling in the mid to lower abdominal small bowel up to 2.7 cm. The exact transition to decompressed caliber large not seen but there are small caliber segments in the right lower quadrant below the cecum. Findings could be due to an ileus or a low-grade partial small bowel obstruction. The appendix is normal. There are no just medication tablets of the sacrum, fluid in the large bowel. No wall thickening or dilatation in the large bowel. Vascular/Lymphatic: Aortic atherosclerosis. No enlarged abdominal or pelvic lymph nodes. Reproductive: Prostate brachytherapy.  No mass in the prostate bed. Other: Left inguinal fat hernia. No incarcerated hernia. No free fluid or free air. Postsurgical change low anterior pelvic wall. Right inguinal hernia repair. Musculoskeletal: Osteopenia and degenerative change lumbar spine, degenerative change most advanced at L5-S1 no worrisome regional bone lesion. IMPRESSION: 1. Mild dilatation and fluid filling of the mid to lower abdominal small bowel up to 2.7 cm. 2. The exact transition to decompressed caliber is not seen but there are normal caliber segments in the right lower quadrant below the cecum. Findings could be due to an ileus or a low-grade partial small bowel obstruction. 3. Moderate  irregular fold thickening in the body and antrum of the stomach, seen previously. 4. Moderate circumferential thickening of the distal thoracic esophagus. Correlate with latest EGD results. 5. 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 6. Aortic atherosclerosis. 7. Prostate brachytherapy. 8. Left inguinal fat hernia.  Right inguinal herniorrhaphy. Aortic Atherosclerosis (ICD10-I70.0). Electronically Signed   By: Francis Quam M.D.   On: 10/13/2023 07:38        Scheduled Meds:  enoxaparin  (LOVENOX ) injection  30 mg Subcutaneous Q24H   pantoprazole  (PROTONIX ) IV  40 mg Intravenous Q24H   Continuous Infusions:   LOS: 0 days    Time spent: 45 minutes spent on chart  review, discussion with nursing staff, consultants, updating family and interview/physical exam; more than 50% of that time was spent in counseling and/or coordination of care.    Harlene RAYMOND Bowl, DO [facility] Available via Epic secure chat 7am-7pm After these hours, please refer to coverage provider listed on [email or web address] 10/14/2023, 12:53 PM

## 2023-10-15 DIAGNOSIS — N179 Acute kidney failure, unspecified: Secondary | ICD-10-CM | POA: Diagnosis not present

## 2023-10-15 LAB — CBC
HCT: 37.7 % — ABNORMAL LOW (ref 39.0–52.0)
Hemoglobin: 13.3 g/dL (ref 13.0–17.0)
MCH: 34.1 pg — ABNORMAL HIGH (ref 26.0–34.0)
MCHC: 35.3 g/dL (ref 30.0–36.0)
MCV: 96.7 fL (ref 80.0–100.0)
Platelets: 211 K/uL (ref 150–400)
RBC: 3.9 MIL/uL — ABNORMAL LOW (ref 4.22–5.81)
RDW: 11.7 % (ref 11.5–15.5)
WBC: 7 K/uL (ref 4.0–10.5)
nRBC: 0 % (ref 0.0–0.2)

## 2023-10-15 LAB — BASIC METABOLIC PANEL WITH GFR
Anion gap: 13 (ref 5–15)
BUN: 20 mg/dL (ref 8–23)
CO2: 24 mmol/L (ref 22–32)
Calcium: 9.5 mg/dL (ref 8.9–10.3)
Chloride: 104 mmol/L (ref 98–111)
Creatinine, Ser: 1.14 mg/dL (ref 0.61–1.24)
GFR, Estimated: >60 mL/min (ref >60)
Glucose, Bld: 85 mg/dL (ref 70–99)
Potassium: 3.4 mmol/L — ABNORMAL LOW (ref 3.5–5.1)
Sodium: 141 mmol/L (ref 135–145)

## 2023-10-15 MED ORDER — POTASSIUM CHLORIDE CRYS ER 20 MEQ PO TBCR
40.0000 meq | EXTENDED_RELEASE_TABLET | Freq: Once | ORAL | Status: AC
  Administered 2023-10-15: 40 meq via ORAL
  Filled 2023-10-15: qty 2

## 2023-10-15 MED ORDER — NICOTINE 14 MG/24HR TD PT24: 14.0000 mg | MEDICATED_PATCH | Freq: Every day | TRANSDERMAL | 0 refills | Status: DC

## 2023-10-15 MED ORDER — PANTOPRAZOLE SODIUM 40 MG PO TBEC
40.0000 mg | DELAYED_RELEASE_TABLET | Freq: Every day | ORAL | Status: DC
  Administered 2023-10-15: 40 mg via ORAL
  Filled 2023-10-15: qty 1

## 2023-10-15 MED ORDER — NICOTINE 21 MG/24HR TD PT24: 21.0000 mg | MEDICATED_PATCH | Freq: Every day | TRANSDERMAL | Status: DC

## 2023-10-15 NOTE — Plan of Care (Signed)
   Problem: Activity: Goal: Risk for activity intolerance will decrease Outcome: Progressing

## 2023-10-15 NOTE — Discharge Summary (Signed)
 Physician Discharge Summary  Juan Torres:[id number] DOB: 01-08-1946 DOA: 10/13/2023  PCP: Joshua Debby CROME, MD  Admit date: 10/13/2023 Discharge date: 10/15/2023  Admitted From: Home Discharge disposition: Home   Recommendations for Outpatient Follow-Up:   Stay hydrated and avoid constipation   Discharge Diagnosis:   Principal Problem:   AKI (acute kidney injury) (HCC) Active Problems:   GERD (gastroesophageal reflux disease)   Hyperlipidemia, mixed   Essential hypertension   CKD stage 3a, GFR 45-59 ml/min (HCC)   Emphysema lung (HCC)   OSA on CPAP   Pancreatic mass   QT prolongation    Discharge Condition: Improved.  Diet recommendation: Low sodium, heart healthy.  Carbohydrate-modified.  Regular.  Wound care: None.  Code status: Full.   History of Present Illness:    Juan Torres is a 78 y.o. male with medical history significant of colon polyps, anxiety, depression, osteoarthritis of the feet, COPD, familial tremor, GERD, headaches, basal cell cancer, hyperlipidemia, hypertension, prediabetes, prostate cancer, pulmonary nodules, sleep apnea not on CPAP, vitamin D  deficiency, nausea/vomiting and diarrhea who presented to the emergency department with complaints of having nausea and multiple episodes of emesis for the past 6 days.  He also has some episodes of diarrhea yesterday.  He thinks that this was due to eating pizza.  He told me that his wife had similar symptoms which had already resolved, but his wife denied having any N/V/D.  Around the same time, he also hit his head in the bathtub.   No constipation, melena or hematochezia.  No flank pain, dysuria, frequency or hematuria. He denied fever, chills, rhinorrhea, sore throat, wheezing or hemoptysis.  No chest pain, palpitations, diaphoresis, PND, orthopnea or pitting edema of the lower extremities.  No polyuria, polydipsia, polyphagia or blurred vision.    Lab work: CBC showed a white count of 15.8,  hemoglobin 18.0 g/dL platelets 736.  Lipase was 115 units/L.  Lactic acid 2.1 mmol/L.  Venous blood gas showed a pH of 7.46, normal pCO2 and pO2, bicarbonate of 32.0 and acid base excess of 6.9 mmol/L.  CMP showed a chloride of 89 mmol/L, glucose 155, BUN 43, creatinine 2.40 and calcium  10.6 mg/dL after correction.  The rest of the electrolytes and LFTs were normal.   Hospital Course by Problem:   77yoM with a hx of COPD, HTN, HLD, CKD 3b, OSA, prostate cancer tx w/ prostate seed, neuroendocrine tumor of the pancreas here with suspected adhesive pSBO   SBO protocol has demonstrated contrast clearing the small bowel.  Clinically, appears of also resolved is having bowel function and is now hungry.  His exam is completely benign.   - per general surgery -- Soft diet, ok for discharge later today if clearly tolerating  AKI (acute kidney injury) (HCC) Superimposed on:   CKD (chronic kidney disease) stage 3a Avoid hypotension. Avoid nephrotoxins. -Appears to be back to baseline     Pancreatic mass Likely neuroendocrine tumor. No significant growth since 2018.     GERD (gastroesophageal reflux disease) Continue pantoprazole       Hyperlipidemia, mixed Continue ezetimibe  10 mg p.o. daily. Continue rosuvastatin  40 mg p.o. daily. Continue omega-3 fish oils 1000 mg p.o. daily.     Essential hypertension Continue propranolol  80 mg p.o. daily.     Emphysema lung (HCC) Bronchodilators and supplemental oxygen as needed.     OSA on CPAP  Hypokalemia - Replete     QT prolongation Avoid QT prolonging meds as possible.  Medical Consultants:    General Surgery  Discharge Exam:   Vitals:   10/14/23 1509 10/14/23 2213  BP: 126/79 117/71  Pulse: 71 76  Resp: 16 16  Temp: 97.7 F (36.5 C) 98.4 F (36.9 C)  SpO2: 97% 97%   Vitals:   10/14/23 0555 10/14/23 0744 10/14/23 1509 10/14/23 2213  BP: 117/76 115/63 126/79 117/71  Pulse: (!) 56 65 71 76  Resp: 16 14 16 16   Temp:  97.7 F (36.5 C) 97.8 F (36.6 C) 97.7 F (36.5 C) 98.4 F (36.9 C)  TempSrc: Oral Oral  Oral  SpO2: 97% 96% 97% 97%  Weight:      Height:        General exam: Appears calm and comfortable.   The results of significant diagnostics from this hospitalization (including imaging, microbiology, ancillary and laboratory) are listed below for reference.     Procedures and Diagnostic Studies:   DG Abd Portable 1V-Small Bowel Obstruction Protocol-initial, 8 hr delay Result Date: 10/13/2023 CLINICAL DATA:  8 hour delayed small-bowel film. EXAM: PORTABLE ABDOMEN - 1 VIEW COMPARISON:  CT with IV contrast today at 6:50 a.m. FINDINGS: 10:23 p.m. Still noted is gas distention of small bowel segments up to 3.3 cm, upper to mid abdomen with little if any change. Contrast has cleared from the small bowel except for a small amount in decompressed right lower quadrant segments Contrast is seen throughout the normal caliber colon to the rectum. There is no supine evidence of free air or other significant findings. IMPRESSION: 1. Contrast has cleared from the small bowel except for a small amount in decompressed right lower quadrant segments. 2. Persistent gas distention of small bowel segments upper to mid abdomen up to 3.3 cm. Electronically Signed   By: Francis Quam M.D.   On: 10/13/2023 22:44   CT HEAD WO CONTRAST ( ) Result Date: 10/13/2023 EXAM: CT HEAD WITHOUT CONTRAST 10/13/2023 02:58:32 PM TECHNIQUE: CT of the head was performed without the administration of intravenous contrast. Automated exposure control, iterative reconstruction, and/or weight based adjustment of the mA/kV was utilized to reduce the radiation dose to as low as reasonably achievable. COMPARISON: CT head 07/21/2023 CLINICAL HISTORY: Fall, head trauma, N/V. FINDINGS: BRAIN AND VENTRICLES: No acute hemorrhage. No evidence of acute infarct. No hydrocephalus. No extra-axial collection. No mass effect or midline shift. ORBITS: Bilateral lens  replacement. Chronic deformity of the left lamina papyracea. SINUSES: No acute abnormality. SOFT TISSUES AND SKULL: Heterogeneous sclerotic lesion in the right frontal calvarium is similar to prior and likely benign. There is an additional heterogeneous sclerotic focus involving the left aspect of the clivus which is likely benign. No acute soft tissue abnormality. No skull fracture. VASCULATURE: Atherosclerosis involving the bilateral carotid siphons and intracranial left vertebral artery. IMPRESSION: 1. No acute intracranial abnormality related to the fall and head trauma. Electronically signed by: Donnice Mania MD 10/13/2023 03:05 PM EDT RP Workstation: [id number]   CT Renal Stone Study Result Date: 10/13/2023 CLINICAL DATA:  Abdominal/flank pain.  Stone suspected. EXAM: CT ABDOMEN AND PELVIS WITHOUT CONTRAST TECHNIQUE: Multidetector CT imaging of the abdomen and pelvis was performed following the standard protocol without IV contrast. RADIATION DOSE REDUCTION: This exam was performed according to the departmental dose-optimization program which includes automated exposure control, adjustment of the mA and/or kV according to patient size and/or use of iterative reconstruction technique. COMPARISON:  CT with IV contrast 10/07/2023, MRI abdomen without and with contrast 07/12/2023. FINDINGS: Lower chest: Moderate circumferential thickening of the distal  thoracic esophagus. Last EGD was 09/04/2023. Correlate with EGD results. There is a small anterior pericardial effusion, increased. The cardiac size is normal. 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 Hepatobiliary: Small scattered hepatic cysts. No suspicious lesion. The liver mildly steatotic. Unremarkable gallbladder and bile ducts. 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 There is a small central calcification in the mass. Due to  the well-circumscribed homogeneous solid appearance, gross appearance more in line with a neuroendocrine tumor than a primary adenocarcinoma. Certainly adenocarcinoma is not excluded. Rest of the pancreas unremarkable aside from being partially atrophic. Spleen: No abnormality. Adrenals/Urinary Tract: No adrenal mass. No contour deforming abnormality of the unenhanced kidneys. There is no urinary stone or obstruction. Unremarkable bladder for the degree of distention. Stomach/Bowel: Moderate irregular fold thickening in the body and antrum of stomach, seen previously. The upper abdominal small bowel is unremarkable. There is mild dilatation and fluid filling in the mid to lower abdominal small bowel up to 2.7 cm. The exact transition to decompressed caliber large not seen but there are small caliber segments in the right lower quadrant below the cecum. Findings could be due to an ileus or a low-grade partial small bowel obstruction. The appendix is normal. There are no just medication tablets of the sacrum, fluid in the large bowel. No wall thickening or dilatation in the large bowel. Vascular/Lymphatic: Aortic atherosclerosis. No enlarged abdominal or pelvic lymph nodes. Reproductive: Prostate brachytherapy.  No mass in the prostate bed. Other: Left inguinal fat hernia. No incarcerated hernia. No free fluid or free air. Postsurgical change low anterior pelvic wall. Right inguinal hernia repair. Musculoskeletal: Osteopenia and degenerative change lumbar spine, degenerative change most advanced at L5-S1 no worrisome regional bone lesion. IMPRESSION: 1. Mild dilatation and fluid filling of the mid to lower abdominal small bowel up to 2.7 cm. 2. The exact transition to decompressed caliber is not seen but there are normal caliber segments in the right lower quadrant below the cecum. Findings could be due to an ileus or a low-grade partial small bowel obstruction. 3. Moderate irregular fold thickening in the body and  antrum of the stomach, seen previously. 4. Moderate circumferential thickening of the distal thoracic esophagus. Correlate with latest EGD results. 5. 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 6. Aortic atherosclerosis. 7. Prostate brachytherapy. 8. Left inguinal fat hernia.  Right inguinal herniorrhaphy. Aortic Atherosclerosis (ICD10-I70.0). Electronically Signed   By: Francis Quam M.D.   On: 10/13/2023 07:38     Labs:   Basic Metabolic Panel: Recent Labs  Lab 10/13/23 0415 10/13/23 1418 10/14/23 0514 10/15/23 0449  NA 141 137 139 141  K 3.6 3.2* 3.8 3.4*  CL 89* 94* 100 104  CO2 23 24 26 24   GLUCOSE 155* 96 84 85  BUN 43* 42* 33* 20  CREATININE 2.40* 1.87* 1.50* 1.14  CALCIUM  10.4* 9.1 9.1 9.5   GFR Estimated Creatinine Clearance: 47.4 mL/min (by C-G formula based on SCr of 1.14 mg/dL). Liver Function Tests: Recent Labs  Lab 10/13/23 0415 10/14/23 0514  AST  21 16  ALT 10 8  ALKPHOS 51 37*  BILITOT 1.0 0.8  PROT 6.8 5.2*  ALBUMIN 4.3 3.4*   Recent Labs  Lab 10/13/23 0415  LIPASE 115*   No results for input(s): AMMONIA in the last 168 hours. Coagulation profile No results for input(s): INR, PROTIME in the last 168 hours.  CBC: Recent Labs  Lab 10/13/23 0307 10/14/23 0514 10/15/23 0449  WBC 15.8* 7.6 7.0  HGB 18.0* 13.1 13.3  HCT 51.0 38.6* 37.7*  MCV 96.6 99.0 96.7  PLT 263 207 211   Cardiac Enzymes: No results for input(s): CKTOTAL, CKMB, CKMBINDEX, TROPONINI in the last 168 hours. BNP: Invalid input(s): POCBNP CBG: No results for input(s): GLUCAP in the last 168 hours. D-Dimer No results for input(s):  DDIMER in the last 72 hours. Hgb A1c No results for input(s): HGBA1C in the last 72 hours. Lipid Profile No results for input(s): CHOL, HDL, LDLCALC, TRIG, CHOLHDL, LDLDIRECT in the last 72 hours. Thyroid  function studies No results for input(s): TSH, T4TOTAL, T3FREE, THYROIDAB in the last 72 hours.  Invalid input(s): FREET3 Anemia work up No results for input(s): VITAMINB12, FOLATE, FERRITIN, TIBC, IRON, RETICCTPCT in the last 72 hours. Microbiology No results found for this or any previous visit (from the past 240 hours).   Discharge Instructions:   Discharge Instructions     Discharge instructions   Complete by: As directed    Soft diet Stay hydrated Avoid constipation   Increase activity slowly   Complete by: As directed       Allergies as of 10/15/2023       Reactions   Lipitor [atorvastatin] Other (See Comments)   fatigued   Niacin And Related Other (See Comments)   Flushing   Penicillins Rash        Medication List     STOP taking these medications    loperamide  2 MG tablet Commonly known as: Imodium  A-D       TAKE these medications    acetaminophen  325 MG tablet Commonly known as: TYLENOL  Take 2 tablets (650 mg total) by mouth every 6 (six) hours as needed for mild pain (pain score 1-3) or fever (or Fever >/= 101).   cyanocobalamin  500 MCG tablet Commonly known as: VITAMIN B12 Take 1 tablet (500 mcg total) by mouth daily.   ezetimibe  10 MG tablet Commonly known as: ZETIA  Take 1 tablet (10 mg total) by mouth daily for cholesterol.   fenofibrate  micronized 134 MG capsule Commonly known as: LOFIBRA Take 1 capsule (134 mg total) by mouth daily (blood fats).   Fish Oil 1000 MG Caps Take 1,000 mg by mouth every morning.   folic acid  1 MG tablet Commonly known as: FOLVITE  Take 1 tablet (1 mg total) by mouth daily.   LORazepam  1 MG tablet Commonly known as: Ativan  Take 0.5 tablets (0.5 mg total) by  mouth 2 (two) times daily as needed for up to 4 doses (nausea).   magnesium  oxide 400 (240 Mg) MG tablet Commonly known as: MAG-OX Take 1 tablet (400 mg total) by mouth every evening.   multivitamin with minerals Tabs tablet Take 1 tablet by mouth daily.   nicotine  14 mg/24hr patch Commonly known as: NICODERM CQ  - dosed in mg/24 hours Place 1 patch (14 mg total) onto the skin daily.   ondansetron  4 MG disintegrating tablet Commonly known as: ZOFRAN -ODT Take 1 tablet (4 mg total) by mouth every 8 (eight) hours as needed for nausea or vomiting.   pantoprazole   40 MG tablet Commonly known as: PROTONIX  Take 1 tablet (40 mg total) by mouth 2 (two) times daily.   propranolol  ER 120 MG 24 hr capsule Commonly known as: INDERAL  LA Take 1 capsule (120 mg total) by mouth daily. For blood pressure and tremor   rosuvastatin  40 MG tablet Commonly known as: CRESTOR  Take 1 tablet (40 mg total) by mouth daily for cholesterol   sucralfate  1 GM/10ML suspension Commonly known as: CARAFATE  Take 10 mLs (1 g total) by mouth 4 (four) times daily for 30 days, THEN 10 mLs (1 g total) 2 (two) times daily. Start taking on: September 07, 2023   thiamine  100 MG tablet Commonly known as: Vitamin B-1 Take 1 tablet (100 mg total) by mouth daily.   Vitamin D3 125 MCG (5000 UT) Tabs Take 10,000 Units by mouth every morning.        Follow-up Information     Joshua Debby CROME, MD Follow up.   Specialty: Internal Medicine Contact information: [address] [place] [address] [phone]                  Time coordinating discharge: 45 min  Signed:  Harlene RAYMOND Bowl DO  [facility] 10/15/2023, 12:30 PM

## 2023-10-15 NOTE — Progress Notes (Signed)
 Subjective No acute events. Feeling well. No complaints, no abdominal pain reported nor n/v. States he is hungry and would like to eat. +Flatus and BM  Objective: Vital signs in last 24 hours: Temp:  [97.7 F (36.5 C)-98.4 F (36.9 C)] 98.4 F (36.9 C) (09/06 2213) Pulse Rate:  [71-76] 76 (09/06 2213) Resp:  [16] 16 (09/06 2213) BP: (117-126)/(71-79) 117/71 (09/06 2213) SpO2:  [97 %] 97 % (09/06 2213) Last BM Date : 10/14/23  Intake/Output from previous day: 09/06 0701 - 09/07 0700 In: 750 [P.O.:750] Out: 200 [Urine:200] Intake/Output this shift: No intake/output data recorded.  Gen: NAD, comfortable CV: RRR Pulm: Normal work of breathing Abd: Soft, NT/ND  Ext: SCDs in place  Lab Results: CBC  Recent Labs    10/14/23 0514 10/15/23 0449  WBC 7.6 7.0  HGB 13.1 13.3  HCT 38.6* 37.7*  PLT 207 211   BMET Recent Labs    10/14/23 0514 10/15/23 0449  NA 139 141  K 3.8 3.4*  CL 100 104  CO2 26 24  GLUCOSE 84 85  BUN 33* 20  CREATININE 1.50* 1.14  CALCIUM  9.1 9.5   PT/INR No results for input(s): LABPROT, INR in the last 72 hours. ABG Recent Labs    10/13/23 0720  HCO3 32.0*    Studies/Results:  Anti-infectives: Anti-infectives (From admission, onward)    None        Assessment/Plan: Patient Active Problem List   Diagnosis Date Noted   AKI (acute kidney injury) (HCC) 10/13/2023   Neuroendocrine carcinoma (HCC) 09/14/2023   Alcoholism (HCC) 09/14/2023   Primary pancreatic neuroendocrine tumor 09/07/2023   High anion gap metabolic acidosis 09/06/2023   Nausea vomiting and diarrhea 09/06/2023   Erosive esophagitis 09/05/2023   Unintentional weight loss 09/05/2023   Pancreatic lesion 09/04/2023   Abnormal MRI of abdomen 09/04/2023   Bilious vomiting with nausea 09/04/2023   Odynophagia 09/04/2023   Acute esophagitis 09/04/2023   ARF (acute renal failure) (HCC) 09/04/2023   Acute renal failure superimposed on stage 3a chronic kidney  disease (HCC) 09/03/2023   Edema of both lower legs 08/24/2023   D-dimer, elevated 08/24/2023   QT prolongation 07/19/2023   Pancreatic mass 07/13/2023   Elevated lipase 06/22/2023   Unexplained weight loss 06/22/2023   Encounter for general adult medical examination with abnormal findings 06/22/2023   Chronic hyperglycemia 06/22/2023   Mild dementia associated with alcoholism, 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   Hypomagnesemia 05/04/2023   Hepatic steatosis 09/18/2019   Familial tremor 07/30/2019   OSA on CPAP 11/05/2018   Thoracic aorta atherosclerosis (HCC) by CT scan  09/12/2018 09/12/2018   Emphysema lung (HCC) 09/12/2018   CKD stage 3a, GFR 45-59 ml/min (HCC) 11/21/2016   Former smoker (quit 12/11/2018, 30+ pack year history) 11/18/2016   BMI 24.0-24.9, adult 12/17/2014   GERD (gastroesophageal reflux disease)    Hyperlipidemia, mixed    Essential hypertension    Vitamin D  deficiency    77yoM with a hx of COPD, HTN, HLD, CKD 3b, OSA, prostate cancer tx w/ prostate seed, neuroendocrine tumor of the pancreas here with suspected adhesive pSBO  SBO protocol has demonstrated contrast clearing the small bowel.  Clinically, appears of also resolved is having bowel function and is now hungry.  His exam is completely benign.  - Soft diet, ok for discharge later today if clearly tolerating - All the above is been reviewed with him.  Questions were answered.  He expressed understanding and agreement with the plan.  LOS: 0 days   I spent a total of 35 minutes in both face-to-face and non-face-to-face activities, excluding procedures performed, for this visit on the date of this encounter.  Lonni Pizza, MD [facility], [facility]

## 2023-10-15 NOTE — Plan of Care (Signed)

## 2023-10-15 NOTE — Progress Notes (Signed)
 Reviewed written d/c instructions w pt, all questions answered, he verbalized understanding. D/C via w/c w all belongings in stable condition.

## 2023-10-16 ENCOUNTER — Encounter: Payer: Self-pay | Admitting: Internal Medicine

## 2023-10-16 ENCOUNTER — Ambulatory Visit: Admitting: Internal Medicine

## 2023-10-16 ENCOUNTER — Other Ambulatory Visit (HOSPITAL_COMMUNITY): Payer: Self-pay

## 2023-10-16 VITALS — BP 106/82 | HR 109 | Temp 98.3°F | Ht 72.0 in | Wt 141.8 lb

## 2023-10-16 DIAGNOSIS — Z8719 Personal history of other diseases of the digestive system: Secondary | ICD-10-CM | POA: Diagnosis not present

## 2023-10-16 DIAGNOSIS — K56609 Unspecified intestinal obstruction, unspecified as to partial versus complete obstruction: Secondary | ICD-10-CM | POA: Insufficient documentation

## 2023-10-16 DIAGNOSIS — N1831 Chronic kidney disease, stage 3a: Secondary | ICD-10-CM

## 2023-10-16 DIAGNOSIS — R112 Nausea with vomiting, unspecified: Secondary | ICD-10-CM

## 2023-10-16 DIAGNOSIS — R197 Diarrhea, unspecified: Secondary | ICD-10-CM

## 2023-10-16 DIAGNOSIS — F1020 Alcohol dependence, uncomplicated: Secondary | ICD-10-CM

## 2023-10-16 DIAGNOSIS — K219 Gastro-esophageal reflux disease without esophagitis: Secondary | ICD-10-CM

## 2023-10-16 MED ORDER — FAMOTIDINE 40 MG PO TABS
40.0000 mg | ORAL_TABLET | Freq: Every day | ORAL | 3 refills | Status: DC
  Filled 2023-10-16: qty 90, 90d supply, fill #0
  Filled 2024-02-11: qty 90, 90d supply, fill #1

## 2023-10-16 MED ORDER — POTASSIUM CHLORIDE CRYS ER 20 MEQ PO TBCR
20.0000 meq | EXTENDED_RELEASE_TABLET | Freq: Every day | ORAL | 3 refills | Status: DC | PRN
  Filled 2023-10-16: qty 30, 30d supply, fill #0

## 2023-10-16 MED ORDER — PANTOPRAZOLE SODIUM 40 MG PO TBEC
40.0000 mg | DELAYED_RELEASE_TABLET | Freq: Two times a day (BID) | ORAL | 3 refills | Status: AC
  Filled 2023-10-16 – 2024-01-10 (×2): qty 180, 90d supply, fill #0

## 2023-10-16 MED ORDER — SUCRALFATE 1 GM/10ML PO SUSP
ORAL | 3 refills | Status: DC
  Filled 2023-10-16: qty 420, 11d supply, fill #0

## 2023-10-16 MED ORDER — PROMETHAZINE HCL 25 MG RE SUPP
25.0000 mg | Freq: Three times a day (TID) | RECTAL | 1 refills | Status: AC | PRN
  Filled 2023-10-16: qty 12, 4d supply, fill #0

## 2023-10-16 NOTE — Progress Notes (Signed)
 Subjective:  Patient ID: Juan Torres, male    DOB: [birth year]  Age: 78 y.o. MRN: [id number]  CC: Hospitalization Follow-up (Patient wife states concerns with nausea and vomitting that he keeps having. States he had a partial bowel obstruction on CT scan. )   HPI Juan Torres presents for recent SBO, nausea, vomiting, dehydration, hypokalemia- post hospital stay visit He is here with his wife Elvie who is a retired [employer] fundraiser   Per hx:  Admit date: 10/13/2023 Discharge date: 10/15/2023   Admitted From: Home Discharge disposition: Home     Recommendations for Outpatient Follow-Up:    Stay hydrated and avoid constipation     Discharge Diagnosis:    Principal Problem:   AKI (acute kidney injury) (HCC) Active Problems:   GERD (gastroesophageal reflux disease)   Hyperlipidemia, mixed   Essential hypertension   CKD stage 3a, GFR 45-59 ml/min (HCC)   Emphysema lung (HCC)   OSA on CPAP   Pancreatic mass   QT prolongation       Discharge Condition: Improved.   Diet recommendation: Low sodium, heart healthy.  Carbohydrate-modified.  Regular.   Wound care: None.   Code status: Full.     History of Present Illness:     Juan Torres is a 78 y.o. male with medical history significant of colon polyps, anxiety, depression, osteoarthritis of the feet, COPD, familial tremor, GERD, headaches, basal cell cancer, hyperlipidemia, hypertension, prediabetes, prostate cancer, pulmonary nodules, sleep apnea not on CPAP, vitamin D  deficiency, nausea/vomiting and diarrhea who presented to the emergency department with complaints of having nausea and multiple episodes of emesis for the past 6 days.  He also has some episodes of diarrhea yesterday.  He thinks that this was due to eating pizza.  He told me that his wife had similar symptoms which had already resolved, but his wife denied having any N/V/D.  Around the same time, he also hit his head in the bathtub.   No constipation, melena or  hematochezia.  No flank pain, dysuria, frequency or hematuria. He denied fever, chills, rhinorrhea, sore throat, wheezing or hemoptysis.  No chest pain, palpitations, diaphoresis, PND, orthopnea or pitting edema of the lower extremities.  No polyuria, polydipsia, polyphagia or blurred vision.    Lab work: CBC showed a white count of 15.8, hemoglobin 18.0 g/dL platelets 736.  Lipase was 115 units/L.  Lactic acid 2.1 mmol/L.  Venous blood gas showed a pH of 7.46, normal pCO2 and pO2, bicarbonate of 32.0 and acid base excess of 6.9 mmol/L.  CMP showed a chloride of 89 mmol/L, glucose 155, BUN 43, creatinine 2.40 and calcium  10.6 mg/dL after correction.  The rest of the electrolytes and LFTs were normal.     Hospital Course by Problem:    77yoM with a hx of COPD, HTN, HLD, CKD 3b, OSA, prostate cancer tx w/ prostate seed, neuroendocrine tumor of the pancreas here with suspected adhesive pSBO   SBO protocol has demonstrated contrast clearing the small bowel.  Clinically, appears of also resolved is having bowel function and is now hungry.  His exam is completely benign.   - per general surgery -- Soft diet, ok for discharge later today if clearly tolerating   AKI (acute kidney injury) (HCC) Superimposed on:   CKD (chronic kidney disease) stage 3a Avoid hypotension. Avoid nephrotoxins. -Appears to be back to baseline     Pancreatic mass Likely neuroendocrine tumor. No significant growth since 2018.     GERD (gastroesophageal reflux  disease) Continue pantoprazole       Hyperlipidemia, mixed Continue ezetimibe  10 mg p.o. daily. Continue rosuvastatin  40 mg p.o. daily. Continue omega-3 fish oils 1000 mg p.o. daily.     Essential hypertension Continue propranolol  80 mg p.o. daily.     Emphysema lung (HCC) Bronchodilators and supplemental oxygen as needed.     OSA on CPAP   Hypokalemia - Replete     QT prolongation Avoid QT prolonging meds as possible.    Outpatient Medications  Prior to Visit  Medication Sig Dispense Refill   Cholecalciferol  (VITAMIN D3) 125 MCG (5000 UT) TABS Take 5,000 Units by mouth every morning.     cyanocobalamin  (VITAMIN B12) 500 MCG tablet Take 1 tablet (500 mcg total) by mouth daily. 30 tablet 0   ezetimibe  (ZETIA ) 10 MG tablet Take 1 tablet (10 mg total) by mouth daily for cholesterol. 90 tablet 3   folic acid  (FOLVITE ) 1 MG tablet Take 1 tablet (1 mg total) by mouth daily. 30 tablet 0   magnesium  oxide (MAG-OX) 400 (240 Mg) MG tablet Take 1 tablet (400 mg total) by mouth every evening. 120 tablet 0   Multiple Vitamin (MULTIVITAMIN WITH MINERALS) TABS tablet Take 1 tablet by mouth daily. 30 tablet 0   Omega-3 Fatty Acids (FISH OIL) 1000 MG CAPS Take 1,000 mg by mouth every morning.     propranolol  ER (INDERAL  LA) 120 MG 24 hr capsule Take 1 capsule (120 mg total) by mouth daily. For blood pressure and tremor 90 capsule 1   rosuvastatin  (CRESTOR ) 40 MG tablet Take 1 tablet (40 mg total) by mouth daily for cholesterol 90 tablet 0   thiamine  (VITAMIN B-1) 100 MG tablet Take 1 tablet (100 mg total) by mouth daily. 30 tablet 0   acetaminophen  (TYLENOL ) 325 MG tablet Take 2 tablets (650 mg total) by mouth every 6 (six) hours as needed for mild pain (pain score 1-3) or fever (or Fever >/= 101). (Patient not taking: Reported on 10/16/2023)     fenofibrate  micronized (LOFIBRA) 134 MG capsule Take 1 capsule (134 mg total) by mouth daily (blood fats). (Patient not taking: Reported on 10/16/2023) 90 capsule 3   LORazepam  (ATIVAN ) 1 MG tablet Take 0.5 tablets (0.5 mg total) by mouth 2 (two) times daily as needed for up to 4 doses (nausea). (Patient not taking: Reported on 10/16/2023) 2 tablet 0   nicotine  (NICODERM CQ  - DOSED IN MG/24 HOURS) 14 mg/24hr patch Place 1 patch (14 mg total) onto the skin daily. (Patient not taking: Reported on 10/16/2023) 28 patch 0   ondansetron  (ZOFRAN -ODT) 4 MG disintegrating tablet Take 1 tablet (4 mg total) by mouth every 8 (eight)  hours as needed for nausea or vomiting. (Patient not taking: Reported on 10/16/2023) 20 tablet 0   pantoprazole  (PROTONIX ) 40 MG tablet Take 1 tablet (40 mg total) by mouth 2 (two) times daily. (Patient not taking: Reported on 10/16/2023) 180 tablet 0   sucralfate  (CARAFATE ) 1 GM/10ML suspension Take 10 mLs (1 g total) by mouth 4 (four) times daily for 30 days, THEN 10 mLs (1 g total) 2 (two) times daily. (Patient not taking: No sig reported) 420 mL 0   No facility-administered medications prior to visit.    ROS: Review of Systems  Constitutional:  Positive for fatigue. Negative for appetite change and unexpected weight change.  HENT:  Negative for congestion, nosebleeds, sneezing, sore throat and trouble swallowing.   Eyes:  Negative for itching and visual disturbance.  Respiratory:  Negative  for cough.   Cardiovascular:  Negative for chest pain, palpitations and leg swelling.  Gastrointestinal:  Negative for abdominal distention, blood in stool, diarrhea and nausea.  Genitourinary:  Negative for frequency and hematuria.  Musculoskeletal:  Negative for arthralgias, back pain, gait problem, joint swelling and neck pain.  Skin:  Negative for rash.  Neurological:  Negative for dizziness, speech difficulty and weakness.  Psychiatric/Behavioral:  Negative for agitation, dysphoric mood, sleep disturbance and suicidal ideas. The patient is not nervous/anxious.     Objective:  BP 106/82   Pulse (!) 109   Temp 98.3 F (36.8 C) (Oral)   Ht 6' (1.829 m)   Wt 141 lb 12.8 oz (64.3 kg)   SpO2 98%   BMI 19.23 kg/m   BP Readings from Last 3 Encounters:  10/16/23 106/82  10/14/23 117/71  10/07/23 (!) 145/75    Wt Readings from Last 3 Encounters:  10/16/23 141 lb 12.8 oz (64.3 kg)  10/13/23 136 lb 3.9 oz (61.8 kg)  10/07/23 151 lb (68.5 kg)    Physical Exam Constitutional:      General: He is not in acute distress.    Appearance: Normal appearance. He is well-developed.     Comments: NAD   Eyes:     Conjunctiva/sclera: Conjunctivae normal.     Pupils: Pupils are equal, round, and reactive to light.  Neck:     Thyroid : No thyromegaly.     Vascular: No JVD.  Cardiovascular:     Rate and Rhythm: Normal rate and regular rhythm.     Heart sounds: Normal heart sounds. No murmur heard.    No friction rub. No gallop.  Pulmonary:     Effort: Pulmonary effort is normal. No respiratory distress.     Breath sounds: Normal breath sounds. No wheezing or rales.  Chest:     Chest wall: No tenderness.  Abdominal:     General: Bowel sounds are normal. There is no distension.     Palpations: Abdomen is soft. There is no mass.     Tenderness: There is no abdominal tenderness. There is no guarding or rebound.  Musculoskeletal:        General: No tenderness. Normal range of motion.     Cervical back: Normal range of motion.  Lymphadenopathy:     Cervical: No cervical adenopathy.  Skin:    General: Skin is warm and dry.     Findings: No rash.  Neurological:     Mental Status: He is alert and oriented to person, place, and time.     Cranial Nerves: No cranial nerve deficit.     Motor: No abnormal muscle tone.     Coordination: Coordination normal.     Gait: Gait normal.     Deep Tendon Reflexes: Reflexes are normal and symmetric.  Psychiatric:        Behavior: Behavior normal.        Thought Content: Thought content normal.        Judgment: Judgment normal.     Lab Results  Component Value Date   WBC 7.0 10/15/2023   HGB 13.3 10/15/2023   HCT 37.7 (L) 10/15/2023   PLT 211 10/15/2023   GLUCOSE 85 10/15/2023   CHOL 138 12/01/2022   TRIG 113 12/01/2022   HDL 56 12/01/2022   LDLCALC 62 12/01/2022   ALT 8 10/14/2023   AST 16 10/14/2023   NA 141 10/15/2023   K 3.4 (L) 10/15/2023   CL 104 10/15/2023   CREATININE  1.14 10/15/2023   BUN 20 10/15/2023   CO2 24 10/15/2023   TSH 1.55 06/22/2023   PSA 0.00 (L) 06/22/2023   INR 1.0 05/04/2023   HGBA1C 5.4 08/24/2023    MICROALBUR <0.2 05/31/2022    DG Abd Portable 1V-Small Bowel Obstruction Protocol-initial, 8 hr delay Result Date: 10/13/2023 CLINICAL DATA:  8 hour delayed small-bowel film. EXAM: PORTABLE ABDOMEN - 1 VIEW COMPARISON:  CT with IV contrast today at 6:50 a.m. FINDINGS: 10:23 p.m. Still noted is gas distention of small bowel segments up to 3.3 cm, upper to mid abdomen with little if any change. Contrast has cleared from the small bowel except for a small amount in decompressed right lower quadrant segments Contrast is seen throughout the normal caliber colon to the rectum. There is no supine evidence of free air or other significant findings. IMPRESSION: 1. Contrast has cleared from the small bowel except for a small amount in decompressed right lower quadrant segments. 2. Persistent gas distention of small bowel segments upper to mid abdomen up to 3.3 cm. Electronically Signed   By: Francis Quam M.D.   On: 10/13/2023 22:44   CT HEAD WO CONTRAST ( ) Result Date: 10/13/2023 EXAM: CT HEAD WITHOUT CONTRAST 10/13/2023 02:58:32 PM TECHNIQUE: CT of the head was performed without the administration of intravenous contrast. Automated exposure control, iterative reconstruction, and/or weight based adjustment of the mA/kV was utilized to reduce the radiation dose to as low as reasonably achievable. COMPARISON: CT head 07/21/2023 CLINICAL HISTORY: Fall, head trauma, N/V. FINDINGS: BRAIN AND VENTRICLES: No acute hemorrhage. No evidence of acute infarct. No hydrocephalus. No extra-axial collection. No mass effect or midline shift. ORBITS: Bilateral lens replacement. Chronic deformity of the left lamina papyracea. SINUSES: No acute abnormality. SOFT TISSUES AND SKULL: Heterogeneous sclerotic lesion in the right frontal calvarium is similar to prior and likely benign. There is an additional heterogeneous sclerotic focus involving the left aspect of the clivus which is likely benign. No acute soft tissue abnormality. No  skull fracture. VASCULATURE: Atherosclerosis involving the bilateral carotid siphons and intracranial left vertebral artery. IMPRESSION: 1. No acute intracranial abnormality related to the fall and head trauma. Electronically signed by: Donnice Mania MD 10/13/2023 03:05 PM EDT RP Workstation: [id number]   CT Renal Stone Study Result Date: 10/13/2023 CLINICAL DATA:  Abdominal/flank pain.  Stone suspected. EXAM: CT ABDOMEN AND PELVIS WITHOUT CONTRAST TECHNIQUE: Multidetector CT imaging of the abdomen and pelvis was performed following the standard protocol without IV contrast. RADIATION DOSE REDUCTION: This exam was performed according to the departmental dose-optimization program which includes automated exposure control, adjustment of the mA and/or kV according to patient size and/or use of iterative reconstruction technique. COMPARISON:  CT with IV contrast 10/07/2023, MRI abdomen without and with contrast 07/12/2023. FINDINGS: Lower chest: Moderate circumferential thickening of the distal thoracic esophagus. Last EGD was 09/04/2023. Correlate with EGD results. There is a small anterior pericardial effusion, increased. The cardiac size is normal. 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 Hepatobiliary: Small scattered hepatic cysts. No suspicious lesion. The liver mildly steatotic. Unremarkable gallbladder and bile ducts. 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 There is a small central calcification in the mass. 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 Certainly adenocarcinoma is not excluded. Rest of the pancreas unremarkable aside  from being partially atrophic. Spleen: No abnormality. Adrenals/Urinary Tract: No adrenal mass. No contour deforming abnormality of the unenhanced  kidneys. There is no urinary stone or obstruction. Unremarkable bladder for the degree of distention. Stomach/Bowel: Moderate irregular fold thickening in the body and antrum of stomach, seen previously. The upper abdominal small bowel is unremarkable. There is mild dilatation and fluid filling in the mid to lower abdominal small bowel up to 2.7 cm. The exact transition to decompressed caliber large not seen but there are small caliber segments in the right lower quadrant below the cecum. Findings could be due to an ileus or a low-grade partial small bowel obstruction. The appendix is normal. There are no just medication tablets of the sacrum, fluid in the large bowel. No wall thickening or dilatation in the large bowel. Vascular/Lymphatic: Aortic atherosclerosis. No enlarged abdominal or pelvic lymph nodes. Reproductive: Prostate brachytherapy.  No mass in the prostate bed. Other: Left inguinal fat hernia. No incarcerated hernia. No free fluid or free air. Postsurgical change low anterior pelvic wall. Right inguinal hernia repair. Musculoskeletal: Osteopenia and degenerative change lumbar spine, degenerative change most advanced at L5-S1 no worrisome regional bone lesion. IMPRESSION: 1. Mild dilatation and fluid filling of the mid to lower abdominal small bowel up to 2.7 cm. 2. The exact transition to decompressed caliber is not seen but there are normal caliber segments in the right lower quadrant below the cecum. Findings could be due to an ileus or a low-grade partial small bowel obstruction. 3. Moderate irregular fold thickening in the body and antrum of the stomach, seen previously. 4. Moderate circumferential thickening of the distal thoracic esophagus. Correlate with latest EGD results. 5. 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 Due  to the well-circumscribed homogeneous solid attenuation, gross appearance seems more in line with a neuroendocrine tumor than a primary adenocarcinoma. 6. Aortic atherosclerosis. 7. Prostate brachytherapy. 8. Left inguinal fat hernia.  Right inguinal herniorrhaphy. Aortic Atherosclerosis (ICD10-I70.0). Electronically Signed   By: Francis Quam M.D.   On: 10/13/2023 07:38    Assessment & Plan:   Problem List Items Addressed This Visit     Alcoholism [facility])   [place] knows that he should be drinking.  Apparently has been dry for the past 2 weeks.      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 Restart pantoprazole , Carafate .  Start Pepcid  40 mg at night [place] knows that he should be drinking.  Apparently has been dry for the past 2 weeks. Follow-up with Dr. Joshua and Dr. Wilhelmenia.      Relevant Medications   pantoprazole  (PROTONIX ) 40 MG tablet   sucralfate  (CARAFATE ) 1 GM/10ML suspension   famotidine  (PEPCID ) 40 MG tablet   History of small bowel obstruction   Recent small bowel obstruction, likely due to adhesions.  Status post remote inguinal hernia repair on both sides. Small more frequent meals and good hydration were advised.  Avoid dehydration, electrolyte disbalance.  Do not drink alcohol.  Treat new protein malnutrition Dr Signe SBO, self resolved w/o NG tube 10/2023 H/o inguinal hernia repair on B sides - Dr Gladis, Dr  Connor      Nausea vomiting and diarrhea   Promethazine  suppositories or Zofran  tablets.  He has had severe bouts of nausea vomiting.  Risks of using antinausea medication in the face of QT prolongation discussed.  Will use with caution.      SBO (small bowel obstruction) (HCC)      Meds ordered this encounter  Medications   pantoprazole  (PROTONIX ) 40 MG tablet    Sig:  Take 1 tablet (40 mg total) by mouth 2 (two) times daily.    Dispense:  180 tablet    Refill:  3   sucralfate  (CARAFATE ) 1 GM/10ML suspension    Sig: Take 10 mLs (1 g total) by mouth 4 (four) times daily for 30 days, THEN 10 mLs (1 g total) 2 (two) times daily.    Dispense:  420 mL    Refill:  3   promethazine  (PHENERGAN ) 25 MG suppository    Sig: Place 1 suppository (25 mg total) rectally every 8 (eight) hours as needed for refractory nausea / vomiting.    Dispense:  12 each    Refill:  1   potassium chloride  SA (KLOR-CON  M) 20 MEQ tablet    Sig: Take 1 tablet (20 mEq total) by mouth daily as needed.    Dispense:  30 tablet    Refill:  3   famotidine  (PEPCID ) 40 MG tablet    Sig: Take 1 tablet (40 mg total) by mouth at bedtime.    Dispense:  90 tablet    Refill:  3      Follow-up: Return in about 2 weeks (around 10/30/2023) for f/u with PCP.  Marolyn Noel, MD

## 2023-10-16 NOTE — Assessment & Plan Note (Deleted)
 SBO H/o ing hernia B sides - Dr Gladis, Dr Signe SBO, self resolved w/o NG tube H/o inguinal hernia repair on B sides - Dr Gladis, Dr Signe

## 2023-10-16 NOTE — Assessment & Plan Note (Addendum)
 Recent small bowel obstruction, likely due to adhesions.  Status post remote inguinal hernia repair on both sides. Small more frequent meals and good hydration were advised.  Avoid dehydration, electrolyte disbalance.  Do not drink alcohol.  Treat new protein malnutrition Dr Signe SBO, self resolved w/o NG tube 10/2023 H/o inguinal hernia repair on B sides - Dr Gladis, Dr Signe

## 2023-10-16 NOTE — Assessment & Plan Note (Addendum)
 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 Restart pantoprazole , Carafate .  Start Pepcid  40 mg at night [place] knows that he should be drinking.  Apparently has been dry for the past 2 weeks. Follow-up with Dr. Joshua and Dr. Wilhelmenia.

## 2023-10-16 NOTE — Assessment & Plan Note (Signed)
 Daire knows that he should be drinking.  Apparently has been dry for the past 2 weeks.

## 2023-10-16 NOTE — Assessment & Plan Note (Signed)
 Acute on chronic Hydrate well Not drinking now Check CMET

## 2023-10-16 NOTE — Assessment & Plan Note (Signed)
 Promethazine  suppositories or Zofran  tablets.  He has had severe bouts of nausea vomiting.  Risks of using antinausea medication in the face of QT prolongation discussed.  Will use with caution.

## 2023-11-07 ENCOUNTER — Other Ambulatory Visit: Payer: Self-pay | Admitting: Nurse Practitioner

## 2023-11-07 ENCOUNTER — Other Ambulatory Visit: Payer: Self-pay | Admitting: Internal Medicine

## 2023-11-07 ENCOUNTER — Other Ambulatory Visit (HOSPITAL_COMMUNITY): Payer: Self-pay

## 2023-11-07 ENCOUNTER — Encounter (HOSPITAL_COMMUNITY): Payer: Self-pay

## 2023-11-07 DIAGNOSIS — E782 Mixed hyperlipidemia: Secondary | ICD-10-CM

## 2023-11-10 ENCOUNTER — Other Ambulatory Visit (HOSPITAL_COMMUNITY): Payer: Self-pay

## 2023-11-10 ENCOUNTER — Other Ambulatory Visit: Payer: Self-pay

## 2023-11-10 MED ORDER — ROSUVASTATIN CALCIUM 40 MG PO TABS
40.0000 mg | ORAL_TABLET | Freq: Every day | ORAL | 0 refills | Status: DC
  Filled 2023-11-10: qty 90, 90d supply, fill #0

## 2023-11-13 ENCOUNTER — Other Ambulatory Visit (HOSPITAL_COMMUNITY): Payer: Self-pay

## 2023-11-13 ENCOUNTER — Other Ambulatory Visit: Payer: Self-pay | Admitting: Internal Medicine

## 2023-11-13 DIAGNOSIS — G250 Essential tremor: Secondary | ICD-10-CM

## 2023-11-13 DIAGNOSIS — I10 Essential (primary) hypertension: Secondary | ICD-10-CM

## 2023-11-14 ENCOUNTER — Other Ambulatory Visit (HOSPITAL_COMMUNITY): Payer: Self-pay

## 2023-11-14 MED ORDER — PROPRANOLOL HCL ER 120 MG PO CP24
120.0000 mg | ORAL_CAPSULE | Freq: Every day | ORAL | 1 refills | Status: DC
  Filled 2023-11-14: qty 90, 90d supply, fill #0

## 2023-11-21 ENCOUNTER — Encounter: Payer: Self-pay | Admitting: Internal Medicine

## 2023-11-21 ENCOUNTER — Ambulatory Visit (INDEPENDENT_AMBULATORY_CARE_PROVIDER_SITE_OTHER): Admitting: Internal Medicine

## 2023-11-21 ENCOUNTER — Ambulatory Visit: Attending: Internal Medicine

## 2023-11-21 ENCOUNTER — Ambulatory Visit: Payer: Self-pay | Admitting: Internal Medicine

## 2023-11-21 VITALS — BP 130/70 | HR 52 | Temp 98.0°F | Ht 72.0 in | Wt 158.0 lb

## 2023-11-21 DIAGNOSIS — R001 Bradycardia, unspecified: Secondary | ICD-10-CM | POA: Diagnosis not present

## 2023-11-21 DIAGNOSIS — I10 Essential (primary) hypertension: Secondary | ICD-10-CM | POA: Diagnosis not present

## 2023-11-21 DIAGNOSIS — E782 Mixed hyperlipidemia: Secondary | ICD-10-CM

## 2023-11-21 DIAGNOSIS — E876 Hypokalemia: Secondary | ICD-10-CM | POA: Diagnosis not present

## 2023-11-21 DIAGNOSIS — N1831 Chronic kidney disease, stage 3a: Secondary | ICD-10-CM

## 2023-11-21 LAB — LIPID PANEL
Cholesterol: 167 mg/dL (ref 0–200)
HDL: 55.3 mg/dL (ref >39.00)
LDL Cholesterol: 87 mg/dL (ref 0–99)
NonHDL: 111.97
Total CHOL/HDL Ratio: 3
Triglycerides: 123 mg/dL (ref 0.0–149.0)
VLDL: 24.6 mg/dL (ref 0.0–40.0)

## 2023-11-21 LAB — CBC WITH DIFFERENTIAL/PLATELET
Basophils Absolute: 0 K/uL (ref 0.0–0.1)
Basophils Relative: 0.7 % (ref 0.0–3.0)
Eosinophils Absolute: 0.1 K/uL (ref 0.0–0.7)
Eosinophils Relative: 1.9 % (ref 0.0–5.0)
HCT: 42.2 % (ref 39.0–52.0)
Hemoglobin: 14 g/dL (ref 13.0–17.0)
Lymphocytes Relative: 26.7 % (ref 12.0–46.0)
Lymphs Abs: 1.7 K/uL (ref 0.7–4.0)
MCHC: 33.1 g/dL (ref 30.0–36.0)
MCV: 100.3 fl — ABNORMAL HIGH (ref 78.0–100.0)
Monocytes Absolute: 0.7 K/uL (ref 0.1–1.0)
Monocytes Relative: 11.5 % (ref 3.0–12.0)
Neutro Abs: 3.7 K/uL (ref 1.4–7.7)
Neutrophils Relative %: 59.2 % (ref 43.0–77.0)
Platelets: 239 K/uL (ref 150.0–400.0)
RBC: 4.21 Mil/uL — ABNORMAL LOW (ref 4.22–5.81)
RDW: 13.3 % (ref 11.5–15.5)
WBC: 6.2 K/uL (ref 4.0–10.5)

## 2023-11-21 LAB — BASIC METABOLIC PANEL WITH GFR
BUN: 11 mg/dL (ref 6–23)
CO2: 30 meq/L (ref 19–32)
Calcium: 9.9 mg/dL (ref 8.4–10.5)
Chloride: 105 meq/L (ref 96–112)
Creatinine, Ser: 1.23 mg/dL (ref 0.40–1.50)
GFR: 56.41 mL/min — ABNORMAL LOW (ref >60.00)
Glucose, Bld: 92 mg/dL (ref 70–99)
Potassium: 3.8 meq/L (ref 3.5–5.1)
Sodium: 142 meq/L (ref 135–145)

## 2023-11-21 LAB — TSH: TSH: 3.04 u[IU]/mL (ref 0.35–5.50)

## 2023-11-21 LAB — MAGNESIUM: Magnesium: 1.8 mg/dL (ref 1.5–2.5)

## 2023-11-21 NOTE — Patient Instructions (Signed)
 Bradycardia, Adult Bradycardia is a slower-than-normal heartbeat. A normal resting heart rate for an adult ranges from 60 to 100 beats per minute. With bradycardia, the resting heart rate is less than 60 beats per minute. Bradycardia can prevent enough oxygen  from reaching certain areas of your body when you are active. It can be serious if it keeps enough oxygen  from reaching your brain and other parts of your body. Bradycardia is not a problem for everyone. For some healthy adults, a slow resting heart rate is normal. What are the causes? This condition may be caused by: A problem with the heart, including: A problem with the heart's electrical system, such as a heart block. With a heart block, electrical signals between the chambers of the heart are partially or completely blocked, so they are not able to work as they should. A problem with the heart's natural pacemaker (sinus node). Heart disease. A heart attack. Heart damage. Lyme disease. A heart infection. A heart condition that is present at birth (congenital heart defect). Certain medicines that treat heart conditions. Certain conditions, such as hypothyroidism and obstructive sleep apnea. Problems with the balance of chemicals and other substances, like potassium, in the blood. Trauma. Radiation therapy. What increases the risk? You are more likely to develop this condition if you: Are age 30 or older. Have high blood pressure (hypertension), high cholesterol (hyperlipidemia), or diabetes. Drink heavily, use tobacco or nicotine products, or use drugs. What are the signs or symptoms? Symptoms of this condition include: Light-headedness. Feeling faint or fainting. Fatigue and weakness. Trouble with activity or exercise. Shortness of breath. Chest pain (angina). Drowsiness. Confusion. Dizziness. How is this diagnosed? This condition may be diagnosed based on: Your symptoms. Your medical history. A physical exam. During  the exam, your health care provider will listen to your heartbeat and check your pulse. To confirm the diagnosis, your health care provider may order tests, such as: Blood tests. An electrocardiogram (ECG). This test records the heart's electrical activity. The test can show how fast your heart is beating and whether the heartbeat is steady. A test in which you wear a portable device (event recorder or Holter monitor) to record your heart's electrical activity while you go about your day. An exercise test. How is this treated? Treatment for this condition depends on the cause of the condition and how severe your symptoms are. Treatment may involve: Treatment of the underlying condition. Changing your medicines or how much medicine you take. Having a small, battery-operated device called a pacemaker implanted under the skin. When bradycardia occurs, this device can be used to increase your heart rate and help your heart beat in a regular rhythm. Follow these instructions at home: Lifestyle Manage any health conditions that contribute to bradycardia as told by your health care provider. Follow a heart-healthy diet. A nutrition specialist (dietitian) can help educate you about healthy food options and changes. Follow an exercise program that is approved by your health care provider. Maintain a healthy weight. Try to reduce or manage your stress, such as with yoga or meditation. If you need help reducing stress, ask your health care provider. Do not use any products that contain nicotine or tobacco. These products include cigarettes, chewing tobacco, and vaping devices, such as e-cigarettes. If you need help quitting, ask your health care provider. Do not use illegal drugs. Alcohol  use If you drink alcohol : Limit how much you have to: 0-1 drink a day for women who are not pregnant. 0-2 drinks a day  for men. Know how much alcohol  is in a drink. In the [place] one drink equals one 12 oz bottle of  beer (355 mL), one 5 oz glass of wine (148 mL), or one 1 oz glass of hard liquor (44 mL). General instructions Take over-the-counter and prescription medicines only as told by your health care provider. Keep all follow-up visits. This is important. How is this prevented? In some cases, bradycardia may be prevented by: Treating underlying medical problems. Stopping behaviors or medicines that can trigger the condition. Contact a health care provider if: You feel light-headed or dizzy. You almost faint. You feel weak or are easily fatigued during physical activity. You experience confusion or have memory problems. Get help right away if: You faint. You have chest pains or an irregular heartbeat (palpitations). You have trouble breathing. These symptoms may represent a serious problem that is an emergency. Do not wait to see if the symptoms will go away. Get medical help right away. Call your local emergency services (911 in the [place] Do not drive yourself to the hospital. Summary Bradycardia is a slower-than-normal heartbeat. With bradycardia, the resting heart rate is less than 60 beats per minute. Treatment for this condition depends on the cause. Manage any health conditions that contribute to bradycardia as told by your health care provider. Do not use any products that contain nicotine or tobacco. These products include cigarettes, chewing tobacco, and vaping devices, such as e-cigarettes. Keep all follow-up visits. This is important. This information is not intended to replace advice given to you by your health care provider. Make sure you discuss any questions you have with your health care provider. Document Revised: 05/17/2020 Document Reviewed: 05/17/2020 Elsevier Patient Education  2024 [employer].

## 2023-11-21 NOTE — Progress Notes (Unsigned)
 EP to read.

## 2023-11-21 NOTE — Progress Notes (Signed)
 Subjective:  Patient ID: Juan Torres, male    DOB: 05/26/1945  Age: 78 y.o. MRN: [id number]  CC: Hypertension and Hyperlipidemia   HPI Juan Torres presents for f/up ---  Discussed the use of AI scribe software for clinical note transcription with the patient, who gave verbal consent to proceed.  History of Present Illness Juan Torres is a 78 year old male who presents for follow-up regarding weight gain and gastrin test results.  He has experienced a weight gain of 20 pounds, increasing from 138 to 158 pounds, which he attributes to regaining his appetite. He states, 'I've regained my appetite.'  He underwent a gastrin test. He was instructed to stop Protonix  for a week prior to the test, but received a dose in the emergency room. He remained off Protonix  for an additional three days before the test, which showed elevated gastrin levels of 1500.  No nausea, vomiting, abdominal pain, or issues with bowel movements. He denies any swelling in his legs or feet, and reports no trouble breathing or swallowing. He also denies dizziness or lightheadedness despite a heart rate of 52, which he states has been consistent since he began visiting the clinic.  He has received two initial COVID vaccines and one booster but has not had any since. He has not received a flu shot, expressing concern about getting sick after receiving it in the past.     Outpatient Medications Prior to Visit  Medication Sig Dispense Refill   acetaminophen  (TYLENOL ) 325 MG tablet Take 2 tablets (650 mg total) by mouth every 6 (six) hours as needed for mild pain (pain score 1-3) or fever (or Fever >/= 101).     Cholecalciferol  (VITAMIN D3) 125 MCG (5000 UT) TABS Take 5,000 Units by mouth every morning.     cyanocobalamin  (VITAMIN B12) 500 MCG tablet Take 1 tablet (500 mcg total) by mouth daily. 30 tablet 0   ezetimibe  (ZETIA ) 10 MG tablet Take 1 tablet (10 mg total) by mouth daily for cholesterol. 90 tablet 3    famotidine  (PEPCID ) 40 MG tablet Take 1 tablet (40 mg total) by mouth at bedtime. 90 tablet 3   folic acid  (FOLVITE ) 1 MG tablet Take 1 tablet (1 mg total) by mouth daily. 30 tablet 0   LORazepam  (ATIVAN ) 1 MG tablet Take 0.5 tablets (0.5 mg total) by mouth 2 (two) times daily as needed for up to 4 doses (nausea). 2 tablet 0   magnesium  oxide (MAG-OX) 400 (240 Mg) MG tablet Take 1 tablet (400 mg total) by mouth every evening. 120 tablet 0   Multiple Vitamin (MULTIVITAMIN WITH MINERALS) TABS tablet Take 1 tablet by mouth daily. 30 tablet 0   Omega-3 Fatty Acids (FISH OIL) 1000 MG CAPS Take 1,000 mg by mouth every morning.     ondansetron  (ZOFRAN -ODT) 4 MG disintegrating tablet Take 1 tablet (4 mg total) by mouth every 8 (eight) hours as needed for nausea or vomiting. 20 tablet 0   pantoprazole  (PROTONIX ) 40 MG tablet Take 1 tablet (40 mg total) by mouth 2 (two) times daily. 180 tablet 3   potassium chloride  SA (KLOR-CON  M) 20 MEQ tablet Take 1 tablet (20 mEq total) by mouth daily as needed. 30 tablet 3   promethazine  (PHENERGAN ) 25 MG suppository Place 1 suppository (25 mg total) rectally every 8 (eight) hours as needed for refractory nausea / vomiting. 12 each 1   propranolol  ER (INDERAL  LA) 120 MG 24 hr capsule Take 1 capsule (120  mg total) by mouth daily. For blood pressure and tremor 90 capsule 1   rosuvastatin  (CRESTOR ) 40 MG tablet Take 1 tablet (40 mg total) by mouth daily for cholesterol 90 tablet 0   sucralfate  (CARAFATE ) 1 GM/10ML suspension Take 10 mLs (1 g total) by mouth 4 (four) times daily for 30 days, THEN 10 mLs (1 g total) 2 (two) times daily. 420 mL 3   thiamine  (VITAMIN B-1) 100 MG tablet Take 1 tablet (100 mg total) by mouth daily. 30 tablet 0   fenofibrate  micronized (LOFIBRA) 134 MG capsule Take 1 capsule (134 mg total) by mouth daily (blood fats). (Patient not taking: Reported on 11/21/2023) 90 capsule 3   nicotine  (NICODERM CQ  - DOSED IN MG/24 HOURS) 14 mg/24hr patch Place 1  patch (14 mg total) onto the skin daily. (Patient not taking: Reported on 11/21/2023) 28 patch 0   No facility-administered medications prior to visit.    ROS Review of Systems  Constitutional:  Negative for appetite change, diaphoresis, fatigue and unexpected weight change.  HENT: Negative.    Eyes: Negative.   Respiratory:  Negative for cough, chest tightness, shortness of breath and wheezing.   Cardiovascular:  Negative for chest pain, palpitations and leg swelling.  Gastrointestinal:  Negative for abdominal pain, constipation, diarrhea, nausea and vomiting.  Endocrine: Negative.   Genitourinary: Negative.  Negative for difficulty urinating.  Musculoskeletal: Negative.   Skin: Negative.   Neurological:  Negative for dizziness, weakness and light-headedness.  Hematological:  Negative for adenopathy. Does not bruise/bleed easily.  Psychiatric/Behavioral:  Positive for confusion and decreased concentration. Negative for agitation.     Objective:  BP 130/70 (BP Location: Right Arm, Patient Position: Sitting)   Pulse (!) 52   Temp 98 F (36.7 C) (Oral)   Ht 6' (1.829 m)   Wt 158 lb (71.7 kg)   SpO2 99%   BMI 21.43 kg/m   BP Readings from Last 3 Encounters:  11/21/23 130/70  10/16/23 106/82  10/14/23 117/71    Wt Readings from Last 3 Encounters:  11/21/23 158 lb (71.7 kg)  10/16/23 141 lb 12.8 oz (64.3 kg)  10/13/23 136 lb 3.9 oz (61.8 kg)    Physical Exam Vitals reviewed.  Constitutional:      Appearance: Normal appearance.  HENT:     Nose: Nose normal.     Mouth/Throat:     Mouth: Mucous membranes are moist.  Eyes:     General: No scleral icterus.    Conjunctiva/sclera: Conjunctivae normal.  Cardiovascular:     Rate and Rhythm: Regular rhythm. Bradycardia present.     Heart sounds: S1 normal and S2 normal. Heart sounds are distant. No murmur heard.    Comments: EKG- SB (new), 49 bpm LVH No Q waves Pulmonary:     Effort: Pulmonary effort is normal.      Breath sounds: No stridor. No wheezing, rhonchi or rales.  Abdominal:     General: Abdomen is flat.     Palpations: There is no mass.     Tenderness: There is no abdominal tenderness. There is no guarding.     Hernia: No hernia is present.  Musculoskeletal:     Cervical back: Neck supple.     Right lower leg: 1+ Pitting Edema present.     Left lower leg: 1+ Pitting Edema present.  Lymphadenopathy:     Cervical: No cervical adenopathy.  Skin:    General: Skin is warm and dry.     Findings: No rash.  Neurological:     General: No focal deficit present.     Mental Status: He is alert.     Lab Results  Component Value Date   WBC 6.2 11/21/2023   HGB 14.0 11/21/2023   HCT 42.2 11/21/2023   PLT 239.0 11/21/2023   GLUCOSE 92 11/21/2023   CHOL 167 11/21/2023   TRIG 123.0 11/21/2023   HDL 55.30 11/21/2023   LDLCALC 87 11/21/2023   ALT 8 10/14/2023   AST 16 10/14/2023   NA 142 11/21/2023   K 3.8 11/21/2023   CL 105 11/21/2023   CREATININE 1.23 11/21/2023   BUN 11 11/21/2023   CO2 30 11/21/2023   TSH 3.04 11/21/2023   PSA 0.00 (L) 06/22/2023   INR 1.0 05/04/2023   HGBA1C 5.4 08/24/2023   MICROALBUR <0.2 05/31/2022    DG Abd Portable 1V-Small Bowel Obstruction Protocol-initial, 8 hr delay Result Date: 10/13/2023 CLINICAL DATA:  8 hour delayed small-bowel film. EXAM: PORTABLE ABDOMEN - 1 VIEW COMPARISON:  CT with IV contrast today at 6:50 a.m. FINDINGS: 10:23 p.m. Still noted is gas distention of small bowel segments up to 3.3 cm, upper to mid abdomen with little if any change. Contrast has cleared from the small bowel except for a small amount in decompressed right lower quadrant segments Contrast is seen throughout the normal caliber colon to the rectum. There is no supine evidence of free air or other significant findings. IMPRESSION: 1. Contrast has cleared from the small bowel except for a small amount in decompressed right lower quadrant segments. 2. Persistent gas distention  of small bowel segments upper to mid abdomen up to 3.3 cm. Electronically Signed   By: Francis Quam M.D.   On: 10/13/2023 22:44   CT HEAD WO CONTRAST ( ) Result Date: 10/13/2023 EXAM: CT HEAD WITHOUT CONTRAST 10/13/2023 02:58:32 PM TECHNIQUE: CT of the head was performed without the administration of intravenous contrast. Automated exposure control, iterative reconstruction, and/or weight based adjustment of the mA/kV was utilized to reduce the radiation dose to as low as reasonably achievable. COMPARISON: CT head 07/21/2023 CLINICAL HISTORY: Fall, head trauma, N/V. FINDINGS: BRAIN AND VENTRICLES: No acute hemorrhage. No evidence of acute infarct. No hydrocephalus. No extra-axial collection. No mass effect or midline shift. ORBITS: Bilateral lens replacement. Chronic deformity of the left lamina papyracea. SINUSES: No acute abnormality. SOFT TISSUES AND SKULL: Heterogeneous sclerotic lesion in the right frontal calvarium is similar to prior and likely benign. There is an additional heterogeneous sclerotic focus involving the left aspect of the clivus which is likely benign. No acute soft tissue abnormality. No skull fracture. VASCULATURE: Atherosclerosis involving the bilateral carotid siphons and intracranial left vertebral artery. IMPRESSION: 1. No acute intracranial abnormality related to the fall and head trauma. Electronically signed by: Donnice Mania MD 10/13/2023 03:05 PM EDT RP Workstation: [id number]   CT Renal Stone Study Result Date: 10/13/2023 CLINICAL DATA:  Abdominal/flank pain.  Stone suspected. EXAM: CT ABDOMEN AND PELVIS WITHOUT CONTRAST TECHNIQUE: Multidetector CT imaging of the abdomen and pelvis was performed following the standard protocol without IV contrast. RADIATION DOSE REDUCTION: This exam was performed according to the departmental dose-optimization program which includes automated exposure control, adjustment of the mA and/or kV according to patient size and/or use of iterative  reconstruction technique. COMPARISON:  CT with IV contrast 10/07/2023, MRI abdomen without and with contrast 07/12/2023. FINDINGS: Lower chest: Moderate circumferential thickening of the distal thoracic esophagus. Last EGD was 09/04/2023. Correlate with EGD results. There is a small anterior pericardial  effusion, increased. The cardiac size is normal. 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 Hepatobiliary: Small scattered hepatic cysts. No suspicious lesion. The liver mildly steatotic. Unremarkable gallbladder and bile ducts. 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 There is a small central calcification in the mass. 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 Certainly adenocarcinoma is not excluded. Rest of the pancreas unremarkable aside from being partially atrophic. Spleen: No abnormality. Adrenals/Urinary Tract: No adrenal mass. No contour deforming abnormality of the unenhanced kidneys. There is no urinary stone or obstruction. Unremarkable bladder for the degree of distention. Stomach/Bowel: Moderate irregular fold thickening in the body and antrum of stomach, seen previously. The upper abdominal small bowel is unremarkable. There is mild dilatation and fluid filling in the mid to lower abdominal small bowel up to 2.7 cm. The exact transition to decompressed caliber large not seen but there are small caliber segments in the right lower quadrant below the cecum. Findings could be due to an ileus or a low-grade partial small bowel obstruction. The appendix is normal. There are no just medication tablets of the sacrum, fluid in the large bowel. No wall thickening or dilatation in the large bowel. Vascular/Lymphatic: Aortic atherosclerosis. No enlarged abdominal or pelvic lymph  nodes. Reproductive: Prostate brachytherapy.  No mass in the prostate bed. Other: Left inguinal fat hernia. No incarcerated hernia. No free fluid or free air. Postsurgical change low anterior pelvic wall. Right inguinal hernia repair. Musculoskeletal: Osteopenia and degenerative change lumbar spine, degenerative change most advanced at L5-S1 no worrisome regional bone lesion. IMPRESSION: 1. Mild dilatation and fluid filling of the mid to lower abdominal small bowel up to 2.7 cm. 2. The exact transition to decompressed caliber is not seen but there are normal caliber segments in the right lower quadrant below the cecum. Findings could be due to an ileus or a low-grade partial small bowel obstruction. 3. Moderate irregular fold thickening in the body and antrum of the stomach, seen previously. 4. Moderate circumferential thickening of the distal thoracic esophagus. Correlate with latest EGD results. 5. 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 6. Aortic atherosclerosis. 7. Prostate brachytherapy. 8. Left inguinal fat hernia.  Right inguinal herniorrhaphy. Aortic Atherosclerosis (ICD10-I70.0). Electronically Signed   By: Francis Quam M.D.   On: 10/13/2023 07:38    Assessment & Plan:   Bradycardia with 41-50 beats per minute- Will evaluate with a heart monitor. -     LONG TERM MONITOR (3-14 DAYS); Future -     TSH; Future -     EKG 12-Lead  Chronic hypokalemia -     Basic metabolic panel with GFR; Future -     Magnesium ; Future  Essential hypertension- BP is well controlled. -     Basic metabolic panel with GFR; Future -     CBC with Differential/Platelet;  Future  Hyperlipidemia, mixed -     Lipid panel; Future  CKD stage 3a, GFR 45-59 ml/min (HCC)- Will avoid nephrotoxic agents  Follow-up: Return in about 3 months (around 02/21/2024).  Debby Molt, MD

## 2023-11-22 ENCOUNTER — Other Ambulatory Visit: Payer: Self-pay | Admitting: Nurse Practitioner

## 2023-11-22 ENCOUNTER — Other Ambulatory Visit: Payer: Self-pay | Admitting: Internal Medicine

## 2023-11-22 ENCOUNTER — Other Ambulatory Visit (HOSPITAL_COMMUNITY): Payer: Self-pay

## 2023-11-22 ENCOUNTER — Other Ambulatory Visit: Payer: Self-pay

## 2023-11-22 DIAGNOSIS — E782 Mixed hyperlipidemia: Secondary | ICD-10-CM

## 2023-11-22 MED ORDER — EZETIMIBE 10 MG PO TABS
10.0000 mg | ORAL_TABLET | Freq: Every day | ORAL | 1 refills | Status: AC
  Filled 2023-11-22: qty 90, 90d supply, fill #0
  Filled 2024-02-28: qty 90, 90d supply, fill #1

## 2023-11-23 ENCOUNTER — Other Ambulatory Visit (HOSPITAL_COMMUNITY): Payer: Self-pay

## 2023-11-26 NOTE — Progress Notes (Signed)
 "  Juan Torres [id number] 04-01-1945   Chief Complaint: Discuss endoscopy  Referring Provider: Joshua Debby CROME, MD Primary GI MD: Dr. Wilhelmenia  HPI: Juan Torres is a 78 y.o. male with past medical history of anxiety, depression, GERD, COPD, HTN, HLD, CKD 3, OSA, prostate cancer tx w/ prostate seed, neuroendocrine tumor of the pancreas who presents today to discuss endoscopy.    Admitted 10/13/2023 to 10/15/2023 after presenting to the ED with nausea and vomiting as well as diarrhea.  Suspected adhesive partial small bowel obstruction.  SBO protocol demonstrated contrast clearing the small bowel. Noted to have pancreatic mass which is a likely neuroendocrine tumor no significant growth since 2018.  Underwent upper EUS in July which on biopsy showed a neuroendocrine tumor.    Seen by oncology 09/21/2023.  Tumor noted to be present since at least 2018, no nodal or distant mets on scan. Question of whether Whipple surgery would be tolerated given his advanced age.  Observation felt to be reasonable.  Seen by surgery 10/03/2023 for follow-up of pancreatic mass.  Was discussed that resection via Whipple is standard of care and only curative option, however not without significant risk, particularly given his CKD, COPD and current smoking.  Observation alone again thought not to be unreasonable.  Noted to have a gastrin level of 2097 on 7/28, however was on PPI at the time.  Zollinger-Ellison syndrome thought to be unlikely as abdominal pain and diarrhea resolved with cessation of alcohol consumption and he has other risk factors for peptic ulcer disease (primarily smoking).  Plan was to obtain a serum gastrin level after 7 days off PPI to further evaluate.  Plan is for follow-up in 6 months with imaging.  Gastrin level 1595 on 10/13/2023.  Seen by internal medicine 11/21/2023.  Noted to have bradycardia.  Denied any dizziness or lightheadedness despite heart rate of 52.  Was started on heart  monitor.   Discussed the use of AI scribe software for clinical note transcription with the patient, who gave verbal consent to proceed.  History of Present Illness Juan Torres is a 78 year old male with esophagitis and a neuroendocrine tumor who presents to discuss repeat endoscopy.  After being found to have an elevated gastrin level of 2097 on 7/28, he was advised by surgery to stop PPI for a week and retest.  During the week he was off PPI, he experienced nausea and vomiting and presented to the ED, admitted as previously noted.  States that during that time he was given Protonix .  Advised to have an extended discontinuation.  Of Protonix , but after doing this gastrin levels remained elevated, though lower than previously (1595).  He has a history of burning chest pain which he associates with alcohol consumption, and which resolved after he stopped drinking. Currently, there is no abdominal pain, nausea, vomiting, or fever. Bowel movements are regular with no diarrhea or constipation. He denies any dark or bloody stools.  He is on Protonix  twice daily and Pepcid  at bedtime. He completed two bottles of Carafate  for burning, but no longer finds it necessary as symptoms have improved. He has gained weight from 138 to 163 pounds over three months and maintains a good appetite.  Recently found to have bradycardia.  His wife is concerned that he may have had a missed cardiac event.  Has not started on the Holter monitor yet.  Previous GI Procedures/Imaging   CT renal stone study 10/13/2023 1. Mild dilatation and fluid filling  of the mid to lower abdominal small bowel up to 2.7 cm. 2. The exact transition to decompressed caliber is not seen but there are normal caliber segments in the right lower quadrant below the cecum. Findings could be due to an ileus or a low-grade partial small bowel obstruction. 3. Moderate irregular fold thickening in the body and antrum of the stomach, seen  previously. 4. Moderate circumferential thickening of the distal thoracic esophagus. Correlate with latest EGD results. 5. 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 6. Aortic atherosclerosis. 7. Prostate brachytherapy. 8. Left inguinal fat hernia.  Right inguinal herniorrhaphy.  CT A/P 10/07/2023 1. Moderate wall thickening of the included distal esophagus with mild adjacent soft tissue edema, suspicious for esophagitis. 2. Irregular wall thickening of the distal stomach. Differential considerations include gastritis, peptic ulcer disease, and neoplasm. Recommend correlation with recent EGD. 3. Fluid/liquid stool throughout the colon with colonic air-fluid levels, can be seen with diarrheal illness. No bowel obstruction. 4. 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 5. Trace right pleural effusion.  Upper EUS 09/04/2023  EGD Impression:  - No gross lesions in the proximal esophagus. LA Grade D erosive esophagitis with bleeding found in the middle and distal esophagus - biopsied.  - 3 cm hiatal hernia.  - Gastritis. Biopsied.  - Duodenitis and duodenal ulcers with hemorrhage noted in the proximal duodenum.Biopsied.  - Congested major papilla.  Path: A. STOMACH BIOPSY:  - Mild chronic gastritis.  - Negative for H. pylori on HE stain  - No intestinal metaplasia, dysplasia, or malignancy.   B. RANDOM ESOPHAGEAL BIOPSY:  - Acute nonspecific esophagitis with ulceration  - PAS negative for fungal organisms  - HSV and CMV stains are negative  - Negative for dysplasia or malignancy   C. DUODENAL BIOPSY:  -  Peptic duodenitis.  - No dysplasia or malignancy.    EUS impression:  - A mass was identified in the pancreatic head. Cytology results are pending. However, the endosonographic appearance is suspicious for a neuroendocrine tumor. This was staged T2 N0 Mx by endosonographic criteria. The staging applies if malignancy is confirmed. Fine needle biopsy performed.  - There was no sign of significant pathology in the common bile duct and in the common hepatic duct.  - Hyperechoic material consistent with sludge was visualized endosonographically in the gallbladder.  - No malignant- appearing lymph nodes were visualized in the celiac region ( level 20) , peripancreatic region and porta hepatis region.  Path: FINAL MICROSCOPIC DIAGNOSIS:  - Neuroendocrine neoplasm   Colonoscopy 12/02/2011 - 3 sessile polyps measuring 2 mm in size were found in the ascending colon, polypectomy was performed with cold forceps - Sessile polyp measuring 2 mm in size was found in the descending colon, polypectomy was performed with cold forceps - Mild diverticulosis was noted in the sigmoid colon - The colon mucosa was otherwise normal - Recall 10 years Path: Surgical [P], ascending (3) and descending, polyp - BENIGN POLYPOID COLORECTAL MUCOSA (FOUR FRAGMENTS). - NO DYSPLASIA OR MALIGNANCY IDENTIFIED.  Past Medical History:  Diagnosis Date   Adenomatous colon polyp    Anxiety    Arthritis  FEET   COPD (chronic obstructive pulmonary disease) (HCC)    Depression    Familial tremor    GERD (gastroesophageal reflux disease)    Headache    When younger   History of basal cell cancer 2012   Hyperlipidemia    Hypertension    Nausea vomiting and diarrhea 09/06/2023   Prediabetes    Prostate cancer (HCC) DX 12/19/11   bx=Adenocarcinoma,gleason=3+3=6,volume=42cc,PSA=4.63   PTSD (post-traumatic stress disorder)    Pulmonary nodules 12/10/2018   Numerous unchanged/benign appearing per CT lung 12/2018   Sleep  apnea    no cpap   Vitamin D  deficiency     Past Surgical History:  Procedure Laterality Date   COLONOSCOPY W/ POLYPECTOMY     Adenomatous   CYSTOSCOPY N/A 04/04/2012   Procedure: CYSTOSCOPY;  Surgeon: Toribio Neysa Repine, MD;  Location: [facility];  Service: Urology;  Laterality: N/A;   ESOPHAGOGASTRODUODENOSCOPY N/A 09/04/2023   Procedure: EGD (ESOPHAGOGASTRODUODENOSCOPY);  Surgeon: Wilhelmenia Aloha Raddle., MD;  Location: [facility];  Service: Gastroenterology;  Laterality: N/A;   EUS N/A 09/04/2023   Procedure: ULTRASOUND, UPPER GI TRACT, ENDOSCOPIC;  Surgeon: Wilhelmenia Aloha Raddle., MD;  Location: [facility];  Service: Gastroenterology;  Laterality: N/A;   EYE SURGERY     bilateral cataracts    FINE NEEDLE ASPIRATION  09/04/2023   Procedure: FINE NEEDLE ASPIRATION;  Surgeon: Wilhelmenia Aloha Raddle., MD;  Location: [facility];  Service: Gastroenterology;;   INGUINAL HERNIA REPAIR Bilateral 10/18/2018   Procedure: LAPAROSCOPIC BILATERAL INGUINAL HERNIA REPAIR;  Surgeon: Gladis Cough, MD;  Location: [facility];  Service: General;  Laterality: Bilateral;   INGUINAL HERNIA REPAIR Right 04/25/2023   Procedure: OPEN RIGHT INGUINAL HERNIA REPAIR WITH MESH;  Surgeon: Signe Mitzie LABOR, MD;  Location: [facility];  Service: General;  Laterality: Right;   PROSTATE BIOPSY  12/19/2011   Procedure: BIOPSY TRANSRECTAL ULTRASONIC PROSTATE (TUBP);  Surgeon: Toribio Neysa Repine, MD;  Location[facility];  Service: Urology;  Laterality: N/A;     RADIOACTIVE SEED IMPLANT N/A 04/04/2012   Procedure: RADIOACTIVE SEED IMPLANT;  Surgeon: Toribio Neysa Repine, MD;  Location: [facility];  Service: Urology;  Laterality: N/A;  Seeds Implanted     72  Seeds Found in Bladder     None     Current Outpatient Medications  Medication Sig Dispense Refill   acetaminophen  (TYLENOL ) 325 MG tablet Take 2 tablets (650 mg total) by mouth every 6 (six) hours as needed for  mild pain (pain score 1-3) or fever (or Fever >/= 101).     Cholecalciferol  (VITAMIN D3) 125 MCG (5000 UT) TABS Take 5,000 Units by mouth every morning.     cyanocobalamin  (VITAMIN B12) 500 MCG tablet Take 1 tablet (500 mcg total) by mouth daily. 30 tablet 0   ezetimibe  (ZETIA ) 10 MG tablet Take 1 tablet (10 mg total) by mouth daily for cholesterol. 90 tablet 1   famotidine  (PEPCID ) 40 MG tablet Take 1 tablet (40 mg total) by mouth at bedtime. 90 tablet 3   folic acid  (FOLVITE ) 1 MG tablet Take 1 tablet (1 mg total) by mouth daily. 30 tablet 0   LORazepam  (ATIVAN ) 1 MG tablet Take 0.5 tablets (0.5 mg total) by mouth 2 (two) times daily as needed for up to 4 doses (nausea). 2 tablet 0   magnesium  oxide (MAG-OX) 400 (240 Mg) MG tablet Take 1 tablet (400 mg total) by mouth every evening. 120 tablet 0   Multiple Vitamin (MULTIVITAMIN WITH  MINERALS) TABS tablet Take 1 tablet by mouth daily. 30 tablet 0   Omega-3 Fatty Acids (FISH OIL) 1000 MG CAPS Take 1,000 mg by mouth every morning.     ondansetron  (ZOFRAN -ODT) 4 MG disintegrating tablet Take 1 tablet (4 mg total) by mouth every 8 (eight) hours as needed for nausea or vomiting. 20 tablet 0   pantoprazole  (PROTONIX ) 40 MG tablet Take 1 tablet (40 mg total) by mouth 2 (two) times daily. 180 tablet 3   promethazine  (PHENERGAN ) 25 MG suppository Place 1 suppository (25 mg total) rectally every 8 (eight) hours as needed for refractory nausea / vomiting. 12 each 1   propranolol  ER (INDERAL  LA) 120 MG 24 hr capsule Take 1 capsule (120 mg total) by mouth daily. For blood pressure and tremor 90 capsule 1   rosuvastatin  (CRESTOR ) 40 MG tablet Take 1 tablet (40 mg total) by mouth daily for cholesterol 90 tablet 0   thiamine  (VITAMIN B-1) 100 MG tablet Take 1 tablet (100 mg total) by mouth daily. 30 tablet 0   fenofibrate  micronized (LOFIBRA) 134 MG capsule Take 1 capsule (134 mg total) by mouth daily (blood fats). (Patient not taking: Reported on 11/27/2023) 90  capsule 3   nicotine  (NICODERM CQ  - DOSED IN MG/24 HOURS) 14 mg/24hr patch Place 1 patch (14 mg total) onto the skin daily. (Patient not taking: Reported on 11/27/2023) 28 patch 0   potassium chloride  SA (KLOR-CON  M) 20 MEQ tablet Take 1 tablet (20 mEq total) by mouth daily as needed. (Patient not taking: Reported on 11/27/2023) 30 tablet 3   sucralfate  (CARAFATE ) 1 GM/10ML suspension Take 10 mLs (1 g total) by mouth 4 (four) times daily for 30 days, THEN 10 mLs (1 g total) 2 (two) times daily. (Patient not taking: No sig reported) 420 mL 3   No current facility-administered medications for this visit.    Allergies as of 11/27/2023 - Review Complete 11/27/2023  Allergen Reaction Noted   Lipitor [atorvastatin] Other (See Comments) 01/27/2013   Niacin and related Other (See Comments) 01/27/2013   Penicillins Rash 11/18/2011    Family History  Problem Relation Age of Onset   Breast cancer Mother 67   Heart attack Father 72   Dementia Father    Prostate cancer Maternal Uncle        prostate   Ulcers Maternal Grandmother    Cancer Maternal Grandfather        colon   Prostate cancer Cousin        prostate cancer 1st maternal    Sleep apnea Neg Hx     Social History   Tobacco Use   Smoking status: Every Day    Types: Cigars   Smokeless tobacco: Never   Tobacco comments:    Smokes Black and milds and using a Nicotine . 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  Decreased heart rate. Respiratory: No SOB  Gastrointestinal: See  HPI and otherwise  negative   Physical Exam:  Vital signs: BP (!) 148/72   Pulse (!) 54   Ht 5' 10 (1.778 m) Comment: measursed without shoes  Wt 163 lb 6 oz (74.1 kg)   BMI 23.44 kg/m   Wt Readings from Last 3 Encounters:  11/27/23 163 lb 6 oz (74.1 kg)  11/21/23 158 lb (71.7 kg)  10/16/23 141 lb 12.8 oz (64.3 kg)    Constitutional: Pleasant, well-appearing male in NAD, alert and cooperative Head:  Normocephalic and atraumatic.  Eyes: No scleral icterus.  Respiratory: Respirations even and unlabored. Lungs clear to auscultation bilaterally.  No wheezes, crackles, or rhonchi.  Cardiovascular: Rate 54, regular rhythm. No murmurs. No peripheral edema. Gastrointestinal:  Soft, nondistended, nontender. No rebound or guarding. Normal bowel sounds. No appreciable masses or hepatomegaly. Rectal:  Not performed.  Neurologic:  Alert and oriented x4;  grossly normal neurologically.  Skin:   Dry and intact without significant lesions or rashes. Psychiatric: Oriented to person, place and time. Demonstrates good judgement and reason without abnormal affect or behaviors.   RELEVANT LABS AND IMAGING: CBC    Component Value Date/Time   WBC 6.2 11/21/2023 1540   RBC 4.21 (L) 11/21/2023 1540   HGB 14.0 11/21/2023 1540   HCT 42.2 11/21/2023 1540   PLT 239.0 11/21/2023 1540   MCV 100.3 (H) 11/21/2023 1540   MCH 34.1 (H) 10/15/2023 0449   MCHC 33.1 11/21/2023 1540   RDW 13.3 11/21/2023 1540   LYMPHSABS 1.7 11/21/2023 1540   MONOABS 0.7 11/21/2023 1540   EOSABS 0.1 11/21/2023 1540   BASOSABS 0.0 11/21/2023 1540    CMP     Component Value Date/Time   NA 142 11/21/2023 1540   K 3.8 11/21/2023 1540   CL 105 11/21/2023 1540   CO2 30 11/21/2023 1540   GLUCOSE 92 11/21/2023 1540   BUN 11 11/21/2023 1540   CREATININE 1.23 11/21/2023 1540   CREATININE 1.50 (H) 12/01/2022 1021   CALCIUM  9.9 11/21/2023 1540   PROT 5.2 (L) 10/14/2023 0514   ALBUMIN 3.4 (L) 10/14/2023 0514   AST 16  10/14/2023 0514   ALT 8 10/14/2023 0514   ALKPHOS 37 (L) 10/14/2023 0514   BILITOT 0.8 10/14/2023 0514   GFRNONAA >60 10/15/2023 0449   GFRNONAA 45 (L) 05/20/2020 1517   GFRAA 52 (L) 05/20/2020 1517   Echocardiogram 07/19/2023 1. Left ventricular ejection fraction, by estimation, is 60 to 65% . The left ventricle has normal function. The left ventricle has no regional wall motion abnormalities. There is mild left ventricular hypertrophy. Left ventricular diastolic parameters were normal.  2. Right ventricular systolic function is normal. The right ventricular size is normal.  3. The mitral valve is grossly normal. Trivial mitral valve regurgitation. No evidence of mitral stenosis.  4. The aortic valve is tricuspid. There is mild calcification of the aortic valve. Aortic valve regurgitation is not visualized. Aortic valve sclerosis is present, with no evidence of aortic valve stenosis.  5. The inferior vena cava is normal in size with greater than 50% respiratory variability, suggesting right atrial pressure of 3 mmHg.  Assessment/Plan:   Primary pancreatic neuroendocrine tumor GERD with esophagitis Bradycardia Patient seen today to discuss repeat EGD.  Has history of neuroendocrine tumor of the pancreas.  Follows with surgery and oncology.  Observation felt to be reasonable at this time.  He underwent upper EUS in July which confirmed neuroendocrine tumor.  On EGD found to have LA grade D erosive esophagitis with bleeding, gastritis, duodenitis  and duodenal ulcers with hemorrhage.  Biopsies negative for H. pylori, metaplasia, dysplasia, or malignancy.  HSV and CMV stains negative.  Has had elevated gastrin levels, though Zollinger-Ellison syndrome thought to be unlikely per surgery, as his abdominal pain and diarrhea resolved with cessation of alcohol consumption and has other risk factors for PUD.  Since abstaining from alcohol has not had any recurrence of diarrhea, chest pain, abdominal  pain, nausea, vomiting.  Gastrin level 2097 on 7/28, and repeat gastrin after having been off PPI for several days was 1595 on 9/5.  Has recently been found to have bradycardia and is starting on heart monitor soon for further workup of this.  Will be on monitor for a couple weeks.  Labs 11/21/2023 showed no anemia, stable kidney function, normal TSH.  - Will tentatively schedule repeat EGD for December. I thoroughly discussed the procedure with the patient to include nature of the procedure, alternatives, benefits, and risks (including but not limited to bleeding, infection, perforation, anesthesia/cardiac/pulmonary complications). Patient verbalized understanding and gave verbal consent to proceed with procedure.  - Continue Protonix  40 mg twice daily, famotidine  40 mg at bedtime. - Will schedule reminder to follow-up on patient's heart monitor in a few weeks, and if stable can keep procedures as scheduled. - Will discuss further recommendations with Dr. Wilhelmenia regarding gastrin level.   Camie Furbish, PA-C [facility] 11/27/2023, 10:24 AM  Patient Care Team: Joshua Debby CROME, MD as PCP - General (Internal Medicine) Jason Charleston, MD (Inactive) as Consulting Physician (Radiation Oncology) Octavia Charleston, MD as Consulting Physician (Ophthalmology) Douglass Lunger, MD as Consulting Physician (Nephrology) Mansouraty, Aloha Raddle., MD as Consulting Physician (Gastroenterology)   "

## 2023-11-27 ENCOUNTER — Encounter: Payer: Self-pay | Admitting: Gastroenterology

## 2023-11-27 ENCOUNTER — Ambulatory Visit: Admitting: Gastroenterology

## 2023-11-27 VITALS — BP 148/72 | HR 54 | Ht 70.0 in | Wt 163.4 lb

## 2023-11-27 DIAGNOSIS — D3A8 Other benign neuroendocrine tumors: Secondary | ICD-10-CM

## 2023-11-27 DIAGNOSIS — K2210 Ulcer of esophagus without bleeding: Secondary | ICD-10-CM | POA: Diagnosis not present

## 2023-11-27 DIAGNOSIS — C7A8 Other malignant neuroendocrine tumors: Secondary | ICD-10-CM

## 2023-11-27 DIAGNOSIS — R001 Bradycardia, unspecified: Secondary | ICD-10-CM | POA: Diagnosis not present

## 2023-11-27 DIAGNOSIS — K2100 Gastro-esophageal reflux disease with esophagitis, without bleeding: Secondary | ICD-10-CM

## 2023-11-27 NOTE — Patient Instructions (Addendum)
 Please call our office at [phone] with an update once heart monitor results are in.  You have been scheduled for an endoscopy. Please follow written instructions given to you at your visit today.  If you use inhalers (even only as needed), please bring them with you on the day of your procedure.  If you take any of the following medications, they will need to be adjusted prior to your procedure:   DO NOT TAKE 7 DAYS PRIOR TO TEST- Trulicity (dulaglutide) Ozempic, Wegovy (semaglutide) Mounjaro (tirzepatide) Bydureon Bcise (exanatide extended release)  DO NOT TAKE 1 DAY PRIOR TO YOUR TEST Rybelsus (semaglutide) Adlyxin (lixisenatide) Victoza (liraglutide) Byetta (exanatide) ___________________________________________________________________________   _______________________________________________________  If your blood pressure at your visit was 140/90 or greater, please contact your primary care physician to follow up on this.  _______________________________________________________  If you are age 78 or older, your body mass index should be between 23-30. Your Body mass index is 23.44 kg/m. If this is out of the aforementioned range listed, please consider follow up with your Primary Care Provider.  If you are age 67 or younger, your body mass index should be between 19-25. Your Body mass index is 23.44 kg/m. If this is out of the aformentioned range listed, please consider follow up with your Primary Care Provider.   ________________________________________________________  The [facility] providers would like to encourage you to use MYCHART to communicate with providers for non-urgent requests or questions.  Due to long hold times on the telephone, sending your provider a message by [facility] may be a faster and more efficient way to get a response.  Please allow 48 business hours for a response.  Please remember that this is for non-urgent requests.   _______________________________________________________  [facility] is using a team-based approach to care.  Your team is made up of your doctor and two to three APPS. Our APPS (Nurse Practitioners and Physician Assistants) work with your physician to ensure care continuity for you. They are fully qualified to address your health concerns and develop a treatment plan. They communicate directly with your gastroenterologist to care for you. Seeing the Advanced Practice Practitioners on your physician's team can help you by facilitating care more promptly, often allowing for earlier appointments, access to diagnostic testing, procedures, and other specialty referrals.

## 2023-11-28 ENCOUNTER — Encounter: Payer: Self-pay | Admitting: Gastroenterology

## 2023-11-30 NOTE — Progress Notes (Signed)
 Attending Physician's Attestation   I have reviewed the chart.   I agree with the Advanced Practitioner's note, impression, and recommendations with any updates as below. Glad to hear that he is doing better on medical therapy.  I do think that repeat endoscopic evaluation for the erosive esophagitis and significant inflammation of the GI tract is reasonable.  Since he is getting a Holter monitor, I agree with postponing his procedure out of bed further to ensure there is no further cardiac workup that will be required.  Certainly will defer to my college he and surgical oncology team in regards to whether he may end up being a candidate for consideration of a candidate for surgical interventions; I do agree that those still remain pretty elevated in regards to gastrin level.  If his endoscopic evaluation is much improved, that may go against Zollinger-Ellison syndrome, since usually those findings do not improve until the neuroendocrine lesion is removed.  I do think that the endoscopic evaluation will be helpful, when we can completed.   Aloha Finner, MD [facility] Advanced Endoscopy Office # [phone]

## 2023-12-11 ENCOUNTER — Encounter: Payer: Self-pay | Admitting: Radiology

## 2023-12-16 DIAGNOSIS — R001 Bradycardia, unspecified: Secondary | ICD-10-CM

## 2023-12-18 ENCOUNTER — Other Ambulatory Visit: Payer: Self-pay | Admitting: Internal Medicine

## 2023-12-18 DIAGNOSIS — I471 Supraventricular tachycardia, unspecified: Secondary | ICD-10-CM | POA: Insufficient documentation

## 2023-12-18 DIAGNOSIS — I472 Ventricular tachycardia, unspecified: Secondary | ICD-10-CM | POA: Insufficient documentation

## 2023-12-25 ENCOUNTER — Telehealth: Payer: Self-pay | Admitting: Gastroenterology

## 2023-12-25 NOTE — Telephone Encounter (Signed)
 This patient is currently scheduled for endoscopy with Dr. Wilhelmenia 12/10, however he recently had Holter monitor with findings of SVT as well as 2 episodes of VT.  He has been referred to cardiology but does not yet have an appointment set up.  I discussed with Dr. Wilhelmenia who has advised that we postpone EGD until January and request cardiac clearance for procedure. Please call patient to reschedule and send clearance request, thank you.

## 2023-12-26 ENCOUNTER — Telehealth: Payer: Self-pay

## 2023-12-26 NOTE — Telephone Encounter (Signed)
 EGD has been moved to 02/13/24 at 9 am at the [facility]. Cardiac clearance has been sent    Left message on machine to call back

## 2023-12-26 NOTE — Telephone Encounter (Signed)
 [facility] Pre-operative Risk Assessment     Request for surgical clearance:     Endoscopy Procedure  What type of surgery is being performed?    EGD   When is this surgery scheduled?      02/13/24  What type of clearance is required ?   MEDICAL  Are there any medications that need to be held prior to surgery and how long? NO  Practice name and name of physician performing surgery?      [facility]  What is your office phone and fax number?      Phone- [phone]  Fax- [phone]  Anesthesia type (None, local, MAC, general) ?       MAC   Please route your response to Odetta Curly RN

## 2023-12-26 NOTE — Telephone Encounter (Signed)
 Will have the preop AP review about appt. Looks like pt will need a NEW PT APPT. Will pt need appt with MD or can pt be seen with HF1st provider,

## 2023-12-26 NOTE — Telephone Encounter (Signed)
 Tried contacting patient to schedule NP OFFICE VISIT no answer left a detailed vm to call back and schedule

## 2023-12-26 NOTE — Telephone Encounter (Signed)
   Name: Juan Torres  DOB: 03-09-1945  MRN: [id number]  Primary Cardiologist: None  Chart reviewed as part of pre-operative protocol coverage. Because of [name] past medical history and time since last visit, he will require a follow-up in-office visit in order to better assess preoperative cardiovascular risk.  Pre-op covering staff: - Please schedule appointment and call patient to inform them. If patient already had an upcoming appointment within acceptable timeframe, please add pre-op clearance to the appointment notes so provider is aware. - Please contact requesting surgeon's office via preferred method (i.e, phone, fax) to inform them of need for appointment prior to surgery.    Will be a new patient.    Jon Garre Fradel Baldonado, PA  12/26/2023, 10:24 AM

## 2023-12-27 NOTE — Telephone Encounter (Signed)
 Left message on machine to call back

## 2023-12-28 NOTE — Telephone Encounter (Signed)
 Left message on machine to call back

## 2023-12-28 NOTE — Telephone Encounter (Signed)
 Left the patient a detailed message to call back to get scheduled for a pre-op appointment in office. Second attempt.

## 2023-12-28 NOTE — Telephone Encounter (Signed)
 Attempted to reach pt on several occasions with several message left.  Will mail letter  All appt information has been mailed.

## 2024-01-01 NOTE — Telephone Encounter (Signed)
 Thanks for update. GM

## 2024-01-01 NOTE — Telephone Encounter (Signed)
 S/w the pt and his wife. Pt has been scheduled as new pt appt to see Dr. Ren Ny 01/09/24. Pt and his wife have been given address to [address]    I will update all parties involved.

## 2024-01-03 ENCOUNTER — Ambulatory Visit

## 2024-01-09 ENCOUNTER — Ambulatory Visit

## 2024-01-09 ENCOUNTER — Other Ambulatory Visit (HOSPITAL_COMMUNITY): Payer: Self-pay

## 2024-01-09 VITALS — BP 126/70 | HR 62 | Resp 14 | Ht 72.0 in | Wt 171.0 lb

## 2024-01-09 DIAGNOSIS — I471 Supraventricular tachycardia, unspecified: Secondary | ICD-10-CM

## 2024-01-09 DIAGNOSIS — G4733 Obstructive sleep apnea (adult) (pediatric): Secondary | ICD-10-CM

## 2024-01-09 DIAGNOSIS — J439 Emphysema, unspecified: Secondary | ICD-10-CM

## 2024-01-09 DIAGNOSIS — E782 Mixed hyperlipidemia: Secondary | ICD-10-CM

## 2024-01-09 DIAGNOSIS — Z01818 Encounter for other preprocedural examination: Secondary | ICD-10-CM

## 2024-01-09 MED ORDER — METOPROLOL TARTRATE 25 MG PO TABS
25.0000 mg | ORAL_TABLET | Freq: Two times a day (BID) | ORAL | 3 refills | Status: AC
  Filled 2024-01-09: qty 180, 90d supply, fill #0

## 2024-01-09 NOTE — Patient Instructions (Signed)
 Medication Instructions:  Stop Propranolol  Start Metoprolol tartrate 25 mg twice daily *If you need a refill on your cardiac medications before your next appointment, please call your pharmacy*  Lab Work: NONE If you have labs (blood work) drawn today and your tests are completely normal, you will receive your results only by: MyChart Message (if you have MyChart) OR A paper copy in the mail If you have any lab test that is abnormal or we need to change your treatment, we will call you to review the results.  Testing/Procedures: NONE  Follow-Up: At [facility], you and your health needs are our priority.  As part of our continuing mission to provide you with exceptional heart care, our providers are all part of one team.  This team includes your primary Cardiologist (physician) and Advanced Practice Providers or APPs (Physician Assistants and Nurse Practitioners) who all work together to provide you with the care you need, when you need it.  Your next appointment:   3 month(s)  Provider:   Ren, MD  We recommend signing up for the patient portal called MyChart.  Sign up information is provided on this After Visit Summary.  MyChart is used to connect with patients for Virtual Visits (Telemedicine).  Patients are able to view lab/test results, encounter notes, upcoming appointments, etc.  Non-urgent messages can be sent to your provider as well.   To learn more about what you can do with MyChart, go to [email or web address].

## 2024-01-09 NOTE — Progress Notes (Signed)
 Cardiology Office Note Date:  01/09/2024  ID:  Ilyaas, Musto Apr 01, 1945, MRN [id number] PCP:  Joshua Debby CROME, MD  Cardiologist:  Joelle VEAR Ren Donley, MD  Chief Complaint  Patient presents with   Pre-op Exam     Problems Pre-op for EGD Sinus Bradycardia  EM 11/25- 31 nonstustained SVT (longest 18 beats at 133 bpm); ~10% SVT ectopy TTE 6/25- 60-65% w/ mild LVH Emphysema OSA on CPAP HLD/HTN Former tobacco use (30+ year) Pancreatic mass (NET) M: EE10, PL120, RN40  LDL 87 10/25  Visits  12/25: d/c propanolol and start metop 25 BID, follow up in 3 months for repeat EM    History of Present Illness: Juan Torres is a 78 y.o. male who presents for new visit.   Today because he was found to have sinus bradycardia to 56 during his PCP visit and GI wants preop clearance before EGD.  He denies any presyncope, chest pain or dyspnea with exertion.  He is very active, cutting wood with a chainsaw and sometimes spitting it.  He is able to carry them to the stove and also has done a lot of yard work without any symptoms.  He denies any edema, PND, orthopnea.  He had multiple hospitalizations for alcohol abuse in the spring that was complicated by Mallory-Weiss tear but has not drank alcohol since July.  He smokes Black and milds cigars, but denies any cigarette use.  He denies any pertinent family history of heart disease.  ROS: Please see the history of present illness. All other systems are reviewed and negative.   Past Medical History:  Diagnosis Date   Adenomatous colon polyp    Anxiety    Arthritis    FEET   COPD (chronic obstructive pulmonary disease) (HCC)    Depression    Familial tremor    GERD (gastroesophageal reflux disease)    Headache    When younger   History of basal cell cancer 2012   Hyperlipidemia    Hypertension    Nausea vomiting and diarrhea 09/06/2023   Prediabetes    Prostate cancer (HCC) DX 12/19/11    bx=Adenocarcinoma,gleason=3+3=6,volume=42cc,PSA=4.63   PTSD (post-traumatic stress disorder)    Pulmonary nodules 12/10/2018   Numerous unchanged/benign appearing per CT lung 12/2018   Sleep apnea    no cpap   Vitamin D  deficiency     Past Surgical History:  Procedure Laterality Date   COLONOSCOPY W/ POLYPECTOMY     Adenomatous   CYSTOSCOPY N/A 04/04/2012   Procedure: CYSTOSCOPY;  Surgeon: Toribio Neysa Repine, MD;  Location: [facility];  Service: Urology;  Laterality: N/A;   ESOPHAGOGASTRODUODENOSCOPY N/A 09/04/2023   Procedure: EGD (ESOPHAGOGASTRODUODENOSCOPY);  Surgeon: Wilhelmenia Aloha Raddle., MD;  Location: [facility];  Service: Gastroenterology;  Laterality: N/A;   EUS N/A 09/04/2023   Procedure: ULTRASOUND, UPPER GI TRACT, ENDOSCOPIC;  Surgeon: Wilhelmenia Aloha Raddle., MD;  Location: [facility];  Service: Gastroenterology;  Laterality: N/A;   EYE SURGERY     bilateral cataracts    FINE NEEDLE ASPIRATION  09/04/2023   Procedure: FINE NEEDLE ASPIRATION;  Surgeon: Wilhelmenia Aloha Raddle., MD;  Location: [facility];  Service: Gastroenterology;;   INGUINAL HERNIA REPAIR Bilateral 10/18/2018   Procedure: LAPAROSCOPIC BILATERAL INGUINAL HERNIA REPAIR;  Surgeon: Gladis Cough, MD;  Location: [facility];  Service: General;  Laterality: Bilateral;   INGUINAL HERNIA REPAIR Right 04/25/2023   Procedure: OPEN RIGHT INGUINAL HERNIA REPAIR WITH MESH;  Surgeon: Signe Mitzie LABOR, MD;  Location:  [facility];  Service: General;  Laterality: Right;   PROSTATE BIOPSY  12/19/2011   Procedure: BIOPSY TRANSRECTAL ULTRASONIC PROSTATE (TUBP);  Surgeon: Toribio Neysa Repine, MD;  Location: [facility];  Service: Urology;  Laterality: N/A;     RADIOACTIVE SEED IMPLANT N/A 04/04/2012   Procedure: RADIOACTIVE SEED IMPLANT;  Surgeon: Toribio Neysa Repine, MD;  Location: [facility];  Service: Urology;  Laterality: N/A;  Seeds Implanted     72  Seeds Found in Bladder      None     Current Outpatient Medications  Medication Sig Dispense Refill   acetaminophen  (TYLENOL ) 325 MG tablet Take 2 tablets (650 mg total) by mouth every 6 (six) hours as needed for mild pain (pain score 1-3) or fever (or Fever >/= 101).     Cholecalciferol  (VITAMIN D3) 125 MCG (5000 UT) TABS Take 5,000 Units by mouth every morning.     cyanocobalamin  (VITAMIN B12) 500 MCG tablet Take 1 tablet (500 mcg total) by mouth daily. 30 tablet 0   ezetimibe  (ZETIA ) 10 MG tablet Take 1 tablet (10 mg total) by mouth daily for cholesterol. 90 tablet 1   famotidine  (PEPCID ) 40 MG tablet Take 1 tablet (40 mg total) by mouth at bedtime. 90 tablet 3   folic acid  (FOLVITE ) 1 MG tablet Take 1 tablet (1 mg total) by mouth daily. 30 tablet 0   LORazepam  (ATIVAN ) 1 MG tablet Take 0.5 tablets (0.5 mg total) by mouth 2 (two) times daily as needed for up to 4 doses (nausea). 2 tablet 0   magnesium  oxide (MAG-OX) 400 (240 Mg) MG tablet Take 1 tablet (400 mg total) by mouth every evening. 120 tablet 0   Multiple Vitamin (MULTIVITAMIN WITH MINERALS) TABS tablet Take 1 tablet by mouth daily. 30 tablet 0   Omega-3 Fatty Acids (FISH OIL) 1000 MG CAPS Take 1,000 mg by mouth every morning.     ondansetron  (ZOFRAN -ODT) 4 MG disintegrating tablet Take 1 tablet (4 mg total) by mouth every 8 (eight) hours as needed for nausea or vomiting. 20 tablet 0   pantoprazole  (PROTONIX ) 40 MG tablet Take 1 tablet (40 mg total) by mouth 2 (two) times daily. 180 tablet 3   promethazine  (PHENERGAN ) 25 MG suppository Place 1 suppository (25 mg total) rectally every 8 (eight) hours as needed for refractory nausea / vomiting. 12 each 1   propranolol  ER (INDERAL  LA) 120 MG 24 hr capsule Take 1 capsule (120 mg total) by mouth daily. For blood pressure and tremor 90 capsule 1   rosuvastatin  (CRESTOR ) 40 MG tablet Take 1 tablet (40 mg total) by mouth daily for cholesterol 90 tablet 0   thiamine  (VITAMIN B-1) 100 MG tablet Take 1 tablet (100 mg  total) by mouth daily. 30 tablet 0   No current facility-administered medications for this visit.    Allergies:   Lipitor [atorvastatin], Niacin and related, and Penicillins   Social History:  see above  Family History:  see above  PHYSICAL EXAM: VS:  BP 126/70 (BP Location: Left Arm, Patient Position: Sitting, Cuff Size: Normal)   Pulse 62   Resp 14   Ht 6' (1.829 m)   Wt 171 lb (77.6 kg)   SpO2 97%   BMI 23.19 kg/m  , BMI Body mass index is 23.19 kg/m. GEN: Well nourished, well developed, in no acute distress HEENT: normal Neck: no JVD, carotid bruits, or masses Cardiac: Bradycardic w/ intermittent ectopic beats; no murmurs, rubs, or gallops,no edema  Respiratory:  CTAB bilaterally, normal work of breathing GI: soft, nontender, nondistended, + BS Extremities: No LE edema Skin: warm and dry, no rash Neuro:  Strength and sensation are intact  EKG: NSR w/ PACs  Recent Labs: Reviewed  Studies: Reviewed  ASSESSMENT AND PLAN: SAGE KOPERA is a 78 y.o. male who presents for new visit.  - Regarding preop, patient has been asymptomatic to walk up a flight of stairs.  He denies any presyncope and no sustained arrhythmia on event monitor.  Patient can proceed to EGD without additional evaluation, especially given recent normal biventricular function on TTE. - On his event monitor, supraventricular events appear to be mostly sinus tach with some PACs, also seen on ECG here.  He has been asymptomatic.  We will switch his propranolol  to metoprolol 25 mg twice daily. - We will see him back in 3 months.   Signed, Joelle VEAR Ren Donley, MD  01/09/2024 10:15 AM    [facility]

## 2024-01-10 ENCOUNTER — Other Ambulatory Visit (HOSPITAL_COMMUNITY): Payer: Self-pay

## 2024-01-17 ENCOUNTER — Encounter: Admitting: Gastroenterology

## 2024-02-13 ENCOUNTER — Encounter: Payer: Self-pay | Admitting: Gastroenterology

## 2024-02-13 ENCOUNTER — Ambulatory Visit (AMBULATORY_SURGERY_CENTER): Admitting: Gastroenterology

## 2024-02-13 VITALS — BP 139/86 | HR 59 | Temp 97.7°F | Resp 8 | Ht 70.0 in | Wt 163.6 lb

## 2024-02-13 DIAGNOSIS — K219 Gastro-esophageal reflux disease without esophagitis: Secondary | ICD-10-CM

## 2024-02-13 DIAGNOSIS — K449 Diaphragmatic hernia without obstruction or gangrene: Secondary | ICD-10-CM | POA: Diagnosis not present

## 2024-02-13 DIAGNOSIS — K2950 Unspecified chronic gastritis without bleeding: Secondary | ICD-10-CM | POA: Diagnosis not present

## 2024-02-13 DIAGNOSIS — K2210 Ulcer of esophagus without bleeding: Secondary | ICD-10-CM

## 2024-02-13 DIAGNOSIS — K2100 Gastro-esophageal reflux disease with esophagitis, without bleeding: Secondary | ICD-10-CM

## 2024-02-13 MED ORDER — SODIUM CHLORIDE 0.9 % IV SOLN: 500.0000 mL | Freq: Once | INTRAVENOUS | Status: DC

## 2024-02-13 NOTE — Progress Notes (Signed)
 Transferred to PACU via stretcher.  Not responding to stimulation at this time.  VSS upon leaving procedure room.

## 2024-02-13 NOTE — Progress Notes (Signed)
 "  GASTROENTEROLOGY PROCEDURE H&P NOTE   Primary Care Physician: Joshua Debby CROME, MD  HPI: Juan Torres is a 79 y.o. male who presents for EGD for followup esophagitis.  Past Medical History:  Diagnosis Date   Adenomatous colon polyp    Anxiety    Arthritis    FEET   COPD (chronic obstructive pulmonary disease) (HCC)    Depression    Familial tremor    GERD (gastroesophageal reflux disease)    Headache    When younger   History of basal cell cancer 2012   Hyperlipidemia    Hypertension    Nausea vomiting and diarrhea 09/06/2023   Prediabetes    Prostate cancer (HCC) DX 12/19/11   bx=Adenocarcinoma,gleason=3+3=6,volume=42cc,PSA=4.63   PTSD (post-traumatic stress disorder)    Pulmonary nodules 12/10/2018   Numerous unchanged/benign appearing per CT lung 12/2018   Sleep apnea    no cpap   Vitamin D  deficiency    Past Surgical History:  Procedure Laterality Date   COLONOSCOPY W/ POLYPECTOMY     Adenomatous   CYSTOSCOPY N/A 04/04/2012   Procedure: CYSTOSCOPY;  Surgeon: Toribio Neysa Repine, MD;  Location[facility];  Service: Urology;  Laterality: N/A;   ESOPHAGOGASTRODUODENOSCOPY N/A 09/04/2023   Procedure: EGD (ESOPHAGOGASTRODUODENOSCOPY);  Surgeon: Wilhelmenia Aloha Raddle., MD;  Location: [facility];  Service: Gastroenterology;  Laterality: N/A;   EUS N/A 09/04/2023   Procedure: ULTRASOUND, UPPER GI TRACT, ENDOSCOPIC;  Surgeon: Wilhelmenia Aloha Raddle., MD;  Location: [facility];  Service: Gastroenterology;  Laterality: N/A;   EYE SURGERY     bilateral cataracts    FINE NEEDLE ASPIRATION  09/04/2023   Procedure: FINE NEEDLE ASPIRATION;  Surgeon: Wilhelmenia Aloha Raddle., MD;  Location: [facility];  Service: Gastroenterology;;   INGUINAL HERNIA REPAIR Bilateral 10/18/2018   Procedure: LAPAROSCOPIC BILATERAL INGUINAL HERNIA REPAIR;  Surgeon: Gladis Cough, MD;  Location: [facility];  Service: General;  Laterality: Bilateral;   INGUINAL HERNIA REPAIR Right  04/25/2023   Procedure: OPEN RIGHT INGUINAL HERNIA REPAIR WITH MESH;  Surgeon: Signe Mitzie LABOR, MD;  Location: [facility];  Service: General;  Laterality: Right;   PROSTATE BIOPSY  12/19/2011   Procedure: BIOPSY TRANSRECTAL ULTRASONIC PROSTATE (TUBP);  Surgeon: Toribio Neysa Repine, MD;  Location: [facility];  Service: Urology;  Laterality: N/A;     RADIOACTIVE SEED IMPLANT N/A 04/04/2012   Procedure: RADIOACTIVE SEED IMPLANT;  Surgeon: Toribio Neysa Repine, MD;  Location: [facility];  Service: Urology;  Laterality: N/A;  Seeds Implanted     72  Seeds Found in Bladder     None    Current Outpatient Medications  Medication Sig Dispense Refill   acetaminophen  (TYLENOL ) 325 MG tablet Take 2 tablets (650 mg total) by mouth every 6 (six) hours as needed for mild pain (pain score 1-3) or fever (or Fever >/= 101).     Cholecalciferol  (VITAMIN D3) 125 MCG (5000 UT) TABS Take 5,000 Units by mouth every morning.     cyanocobalamin  (VITAMIN B12) 500 MCG tablet Take 1 tablet (500 mcg total) by mouth daily. 30 tablet 0   ezetimibe  (ZETIA ) 10 MG tablet Take 1 tablet (10 mg total) by mouth daily for cholesterol. 90 tablet 1   famotidine  (PEPCID ) 40 MG tablet Take 1 tablet (40 mg total) by mouth at bedtime. 90 tablet 3   folic acid  (FOLVITE ) 1 MG tablet Take 1 tablet (1 mg total) by mouth daily. 30 tablet 0   LORazepam  (ATIVAN ) 1 MG tablet  Take 0.5 tablets (0.5 mg total) by mouth 2 (two) times daily as needed for up to 4 doses (nausea). 2 tablet 0   magnesium  oxide (MAG-OX) 400 (240 Mg) MG tablet Take 1 tablet (400 mg total) by mouth every evening. 120 tablet 0   metoprolol  tartrate (LOPRESSOR ) 25 MG tablet Take 1 tablet (25 mg total) by mouth 2 (two) times daily. 180 tablet 3   Multiple Vitamin (MULTIVITAMIN WITH MINERALS) TABS tablet Take 1 tablet by mouth daily. 30 tablet 0   Omega-3 Fatty Acids (FISH OIL) 1000 MG CAPS Take 1,000 mg by mouth every morning.     ondansetron   (ZOFRAN -ODT) 4 MG disintegrating tablet Take 1 tablet (4 mg total) by mouth every 8 (eight) hours as needed for nausea or vomiting. 20 tablet 0   pantoprazole  (PROTONIX ) 40 MG tablet Take 1 tablet (40 mg total) by mouth 2 (two) times daily. 180 tablet 3   promethazine  (PHENERGAN ) 25 MG suppository Place 1 suppository (25 mg total) rectally every 8 (eight) hours as needed for refractory nausea / vomiting. 12 each 1   rosuvastatin  (CRESTOR ) 40 MG tablet Take 1 tablet (40 mg total) by mouth daily for cholesterol 90 tablet 0   thiamine  (VITAMIN B-1) 100 MG tablet Take 1 tablet (100 mg total) by mouth daily. 30 tablet 0   Current Facility-Administered Medications  Medication Dose Route Frequency Provider Last Rate Last Admin   0.9 %  sodium chloride  infusion  500 mL Intravenous Once Mansouraty, Aloha Raddle., MD       Current Medications[1] Allergies[2] Family History  Problem Relation Age of Onset   Breast cancer Mother 78   Heart attack Father 3   Dementia Father    Prostate cancer Maternal Uncle        prostate   Ulcers Maternal Grandmother    Cancer Maternal Grandfather        colon   Prostate cancer Cousin        prostate cancer 1st maternal    Sleep apnea Neg Hx    Social History   Socioeconomic History   Marital status: Married    Spouse name: Not on file   Number of children: 2   Years of education: Not on file   Highest education level: Not on file  Occupational History   Not on file  Tobacco Use   Smoking status: Every Day    Types: Cigars   Smokeless tobacco: Never   Tobacco comments:    Smokes Black and milds and using a Nicotine . Former cigarette smoker quit 2000  Vaping Use   Vaping status: Never Used  Substance and Sexual Activity   Alcohol use: Yes    Alcohol/week: 1.0 standard drink of alcohol    Types: 1 Shots of liquor per week    Comment: used to drink heavy for 20 years, stopped in June 2025   Drug use: Not Currently    Frequency: 7.0 times per week     Types: Marijuana    Comment: / marijuana-last smoked 10/15/2018   Sexual activity: Not Currently  Other Topics Concern   Not on file  Social History Narrative   Lives at home with spouse   Right handed   Caffeine: none   Social Drivers of Health   Tobacco Use: High Risk (01/03/2024)   Patient History    Smoking Tobacco Use: Every Day    Smokeless Tobacco Use: Never    Passive Exposure: Not on file  Financial Resource Strain: Low  Risk (08/07/2023)   Overall Financial Resource Strain (CARDIA)    Difficulty of Paying Living Expenses: Not hard at all  Food Insecurity: No Food Insecurity (10/13/2023)   Epic    Worried About Programme Researcher, Broadcasting/film/video in the Last Year: Never true    Ran Out of Food in the Last Year: Never true  Transportation Needs: No Transportation Needs (10/13/2023)   Epic    Lack of Transportation (Medical): No    Lack of Transportation (Non-Medical): No  Physical Activity: Insufficiently Active (08/07/2023)   Exercise Vital Sign    Days of Exercise per Week: 2 days    Minutes of Exercise per Session: 60 min  Stress: No Stress Concern Present (08/07/2023)   [employer] of Occupational Health - Occupational Stress Questionnaire    Feeling of Stress: Only a little  Social Connections: Socially Integrated (10/13/2023)   Social Connection and Isolation Panel    Frequency of Communication with Friends and Family: More than three times a week    Frequency of Social Gatherings with Friends and Family: More than three times a week    Attends Religious Services: More than 4 times per year    Active Member of Clubs or Organizations: Yes    Attends Banker Meetings: More than 4 times per year    Marital Status: Married  Catering Manager Violence: Not At Risk (10/13/2023)   Epic    Fear of Current or Ex-Partner: No    Emotionally Abused: No    Physically Abused: No    Sexually Abused: No  Depression (PHQ2-9): Low Risk (11/21/2023)   Depression (PHQ2-9)    PHQ-2  Score: 0  Alcohol Screen: Low Risk (08/07/2023)   Alcohol Screen    Last Alcohol Screening Score (AUDIT): 2  Housing: Low Risk (10/13/2023)   Epic    Unable to Pay for Housing in the Last Year: No    Number of Times Moved in the Last Year: 0    Homeless in the Last Year: No  Utilities: Not At Risk (10/13/2023)   Epic    Threatened with loss of utilities: No  Health Literacy: Adequate Health Literacy (08/07/2023)   B1300 Health Literacy    Frequency of need for help with medical instructions: Never    Physical Exam: Today's Vitals   02/13/24 0858 02/13/24 0905  BP: 127/74   Pulse: 68   Temp: 97.7 F (36.5 C) 97.7 F (36.5 C)  TempSrc: Other (Comment)   SpO2: 99%   Weight: 163 lb 9.6 oz (74.2 kg)   Height: 5' 10 (1.778 m)    Body mass index is 23.47 kg/m. GEN: NAD EYE: Sclerae anicteric ENT: MMM CV: Non-tachycardic GI: Soft, NT/ND NEURO:  Alert & Oriented x 3  Lab Results: No results for input(s): WBC, HGB, HCT, PLT in the last 72 hours. BMET No results for input(s): NA, K, CL, CO2, GLUCOSE, BUN, CREATININE, CALCIUM  in the last 72 hours. LFT No results for input(s): PROT, ALBUMIN, AST, ALT, ALKPHOS, BILITOT, BILIDIR, IBILI in the last 72 hours. PT/INR No results for input(s): LABPROT, INR in the last 72 hours.   Impression / Plan: This is a 79 y.o.male who presents for EGD for followup esophagitis.  The risks and benefits of endoscopic evaluation/treatment were discussed with the patient and/or family; these include but are not limited to the risk of perforation, infection, bleeding, missed lesions, lack of diagnosis, severe illness requiring hospitalization, as well as anesthesia and sedation related illnesses.  The  patient's history has been reviewed, patient examined, no change in status, and deemed stable for procedure.  The patient and/or family was provided an opportunity to ask questions and all were answered.  The  patient and/or family is agreeable to proceed.    Aloha Finner, MD [facility] Advanced [facility]oscopy Office # [phone]     [1]  Current Outpatient Medications:    acetaminophen  (TYLENOL ) 325 MG tablet, Take 2 tablets (650 mg total) by mouth every 6 (six) hours as needed for mild pain (pain score 1-3) or fever (or Fever >/= 101)., Disp: , Rfl:    Cholecalciferol  (VITAMIN D3) 125 MCG (5000 UT) TABS, Take 5,000 Units by mouth every morning., Disp: , Rfl:    cyanocobalamin  (VITAMIN B12) 500 MCG tablet, Take 1 tablet (500 mcg total) by mouth daily., Disp: 30 tablet, Rfl: 0   ezetimibe  (ZETIA ) 10 MG tablet, Take 1 tablet (10 mg total) by mouth daily for cholesterol., Disp: 90 tablet, Rfl: 1   famotidine  (PEPCID ) 40 MG tablet, Take 1 tablet (40 mg total) by mouth at bedtime., Disp: 90 tablet, Rfl: 3   folic acid  (FOLVITE ) 1 MG tablet, Take 1 tablet (1 mg total) by mouth daily., Disp: 30 tablet, Rfl: 0   LORazepam  (ATIVAN ) 1 MG tablet, Take 0.5 tablets (0.5 mg total) by mouth 2 (two) times daily as needed for up to 4 doses (nausea)., Disp: 2 tablet, Rfl: 0   magnesium  oxide (MAG-OX) 400 (240 Mg) MG tablet, Take 1 tablet (400 mg total) by mouth every evening., Disp: 120 tablet, Rfl: 0   metoprolol  tartrate (LOPRESSOR ) 25 MG tablet, Take 1 tablet (25 mg total) by mouth 2 (two) times daily., Disp: 180 tablet, Rfl: 3   Multiple Vitamin (MULTIVITAMIN WITH MINERALS) TABS tablet, Take 1 tablet by mouth daily., Disp: 30 tablet, Rfl: 0   Omega-3 Fatty Acids (FISH OIL) 1000 MG CAPS, Take 1,000 mg by mouth every morning., Disp: , Rfl:    ondansetron  (ZOFRAN -ODT) 4 MG disintegrating tablet, Take 1 tablet (4 mg total) by mouth every 8 (eight) hours as needed for nausea or vomiting., Disp: 20 tablet, Rfl: 0   pantoprazole  (PROTONIX ) 40 MG tablet, Take 1 tablet (40 mg total) by mouth 2 (two) times daily., Disp: 180 tablet, Rfl: 3   promethazine  (PHENERGAN ) 25 MG suppository, Place 1 suppository  (25 mg total) rectally every 8 (eight) hours as needed for refractory nausea / vomiting., Disp: 12 each, Rfl: 1   rosuvastatin  (CRESTOR ) 40 MG tablet, Take 1 tablet (40 mg total) by mouth daily for cholesterol, Disp: 90 tablet, Rfl: 0   thiamine  (VITAMIN B-1) 100 MG tablet, Take 1 tablet (100 mg total) by mouth daily., Disp: 30 tablet, Rfl: 0  Current Facility-Administered Medications:    0.9 %  sodium chloride  infusion, 500 mL, Intravenous, Once, Mansouraty, Aloha Raddle., MD [2]  Allergies Allergen Reactions   Lipitor [Atorvastatin] Other (See Comments)    fatigued   Niacin And Related Other (See Comments)    Flushing   Penicillins Rash   "

## 2024-02-13 NOTE — Progress Notes (Signed)
 Called to room to assist during endoscopic procedure.  Patient ID and intended procedure confirmed with present staff. Received instructions for my participation in the procedure from the performing physician.

## 2024-02-13 NOTE — Patient Instructions (Signed)
 Resume previous diet Continue present medications Await pathology results  Handouts/information given for Hiatal hernia, Barrett's Esophagus (what the biopsies were checking for, pathology results will confirm or exclude if he has Barrett's esophagus)   YOU HAD AN ENDOSCOPIC PROCEDURE TODAY AT THE [facility]:   Refer to the procedure report that was given to you for any specific questions about what was found during the examination.  If the procedure report does not answer your questions, please call your gastroenterologist to clarify.  If you requested that your care partner not be given the details of your procedure findings, then the procedure report has been included in a sealed envelope for you to review at your convenience later.  YOU SHOULD EXPECT: Some feelings of bloating in the abdomen. Passage of more gas than usual.  Walking can help get rid of the air that was put into your GI tract during the procedure and reduce the bloating. If you had a lower endoscopy (such as a colonoscopy or flexible sigmoidoscopy) you may notice spotting of blood in your stool or on the toilet paper. If you underwent a bowel prep for your procedure, you may not have a normal bowel movement for a few days.  Please Note:  You might notice some irritation and congestion in your nose or some drainage.  This is from the oxygen used during your procedure.  There is no need for concern and it should clear up in a day or so.  SYMPTOMS TO REPORT IMMEDIATELY:  Following upper endoscopy (EGD)  Vomiting of blood or coffee ground material  New chest pain or pain under the shoulder blades  Painful or persistently difficult swallowing  New shortness of breath  Fever of 100F or higher  Black, tarry-looking stools For urgent or emergent issues, a gastroenterologist can be reached at any hour by calling [phone]. Do not use MyChart messaging for urgent concerns.   DIET:  We do recommend a small meal  at first, but then you may proceed to your regular diet.  Drink plenty of fluids but you should avoid alcoholic beverages for 24 hours.  ACTIVITY:  You should plan to take it easy for the rest of today and you should NOT DRIVE or use heavy machinery until tomorrow (because of the sedation medicines used during the test).    FOLLOW UP: Our staff will call the number listed on your records the next business day following your procedure.  We will call around 7:15- 8:00 am to check on you and address any questions or concerns that you may have regarding the information given to you following your procedure. If we do not reach you, we will leave a message.     If any biopsies were taken you will be contacted by phone or by letter within the next 1-3 weeks.  Please call us  at [phone] if you have not heard about the biopsies in 3 weeks.   SIGNATURES/CONFIDENTIALITY: You and/or your care partner have signed paperwork which will be entered into your electronic medical record.  These signatures attest to the fact that that the information above on your After Visit Summary has been reviewed and is understood.  Full responsibility of the confidentiality of this discharge information lies with you and/or your care-partner.

## 2024-02-13 NOTE — Op Note (Signed)
 [facility] Patient Name: Juan Torres Procedure Date: 02/13/2024 10:03 AM MRN: [id number] Endoscopist: Aloha Finner , MD, [id number] Age: 79 Referring MD:  Date of Birth: 08/01/1945 Gender: Male Account #: [id number] Procedure:                Upper GI endoscopy Indications:              Follow-up of duodenal ulcer, Follow-up of                            duodenitis, Follow-up of esophagitis Medicines:                Monitored Anesthesia Care Procedure:                Pre-Anesthesia Assessment:                           - Prior to the procedure, a History and Physical                            was performed, and patient medications and                            allergies were reviewed. The patient's tolerance of                            previous anesthesia was also reviewed. The risks                            and benefits of the procedure and the sedation                            options and risks were discussed with the patient.                            All questions were answered, and informed consent                            was obtained. Prior Anticoagulants: The patient has                            taken no anticoagulant or antiplatelet agents. ASA                            Grade Assessment: III - A patient with severe                            systemic disease. After reviewing the risks and                            benefits, the patient was deemed in satisfactory                            condition to undergo the procedure.  After obtaining informed consent, the endoscope was                            passed under direct vision. Throughout the                            procedure, the patient's blood pressure, pulse, and                            oxygen saturations were monitored continuously. The                            Endoscope was introduced through the mouth, and                            advanced to the second  part of duodenum. The upper                            GI endoscopy was accomplished without difficulty.                            The patient tolerated the procedure. Scope In: Scope Out: Findings:                 No gross lesions were noted in the proximal                            esophagus and in the mid esophagus.                           Two tongues of salmon-colored mucosa were present                            from 38 to 40 cm. A single nodular area was present                            at 39 cm. Biopsies were taken with a cold forceps                            for histology from the tongues. Biopsies were taken                            with a cold forceps for histology from the nodular                            area.                           The Z-line was irregular and was found 40 cm from                            the incisors.  A 2 cm hiatal hernia was present.                           Striped mildly erythematous mucosa without bleeding                            was found in the entire examined stomach (improved                            from prior). Biopsies were taken with a cold                            forceps for histology and Helicobacter pylori                            testing.                           No gross lesions were noted in the duodenal bulb,                            in the first portion of the duodenum and in the                            second portion of the duodenum (improved                            significantly from prior). Complications:            No immediate complications. Estimated Blood Loss:     Estimated blood loss was minimal. Impression:               - No gross lesions in the proximal esophagus and in                            the mid esophagus.                           - Salmon-colored mucosa suggestive of Barrett's                            esophagus and 1 area of nodularity. Biopsied.                            - Z-line irregular, 40 cm from the incisors.                           - 2 cm hiatal hernia.                           - Erythematous mucosa in the stomach. Biopsied.                           - No gross lesions in the duodenal bulb, in the  first portion of the duodenum and in the second                            portion of the duodenum. Recommendation:           - The patient will be observed post-procedure,                            until all discharge criteria are met.                           - Discharge patient to home.                           - Patient has a contact number available for                            emergencies. The signs and symptoms of potential                            delayed complications were discussed with the                            patient. Return to normal activities tomorrow.                            Written discharge instructions were provided to the                            patient.                           - Resume previous diet.                           - Continue present medications.                           - Await pathology results.                           - Repeat upper endoscopy for surveillance or other                            based on pathology results.                           - The findings and recommendations were discussed                            with the patient.                           - The findings and recommendations were discussed                            with the patient's family.  Aloha Finner, MD 02/13/2024 10:30:23 AM

## 2024-02-14 ENCOUNTER — Telehealth: Payer: Self-pay

## 2024-02-14 NOTE — Telephone Encounter (Signed)
 Attempted to reach patient for follow up phone call. No answer, left voicemail to contact Dr. Melba office with any questions or concerns.

## 2024-02-15 ENCOUNTER — Other Ambulatory Visit: Payer: Self-pay

## 2024-02-15 ENCOUNTER — Other Ambulatory Visit (HOSPITAL_COMMUNITY): Payer: Self-pay

## 2024-02-15 LAB — SURGICAL PATHOLOGY

## 2024-02-16 ENCOUNTER — Ambulatory Visit: Payer: Self-pay | Admitting: Gastroenterology

## 2024-02-19 ENCOUNTER — Telehealth: Payer: Self-pay | Admitting: Gastroenterology

## 2024-02-19 NOTE — Telephone Encounter (Signed)
 Dr Wilhelmenia when you return the pt wants to know if he needs to have labs before his March office visit follow up.

## 2024-02-19 NOTE — Telephone Encounter (Signed)
 Inbound call from patient wife stating that she is needing to know if her husband need to do a gastric lab test before they come in for there follow up appointment set for March the 3 rd. Please advise.

## 2024-02-22 NOTE — Telephone Encounter (Signed)
 I will defer to our oncology and surgical oncology team whether to proceed with updated gastric level draw or not. I think with him being healed in his upper GI tract, he could be off PPI for 2 weeks before restart to get gastric level checked if the team needs. Thanks. GM

## 2024-02-28 ENCOUNTER — Other Ambulatory Visit: Payer: Self-pay | Admitting: Internal Medicine

## 2024-02-28 DIAGNOSIS — E782 Mixed hyperlipidemia: Secondary | ICD-10-CM

## 2024-02-29 ENCOUNTER — Other Ambulatory Visit (HOSPITAL_COMMUNITY): Payer: Self-pay

## 2024-02-29 ENCOUNTER — Other Ambulatory Visit: Payer: Self-pay

## 2024-02-29 MED ORDER — ROSUVASTATIN CALCIUM 40 MG PO TABS
40.0000 mg | ORAL_TABLET | Freq: Every day | ORAL | 0 refills | Status: AC
  Filled 2024-02-29: qty 90, 90d supply, fill #0

## 2024-03-01 ENCOUNTER — Other Ambulatory Visit (HOSPITAL_COMMUNITY): Payer: Self-pay

## 2024-03-01 NOTE — Telephone Encounter (Signed)
 Left detailed message on wife's voicemail with recommendations. Patient is also scheduled for follow-up with Dr Wilhelmenia on 04/09/24.

## 2024-03-07 ENCOUNTER — Other Ambulatory Visit: Payer: Self-pay | Admitting: Surgery

## 2024-03-07 DIAGNOSIS — D3A8 Other benign neuroendocrine tumors: Secondary | ICD-10-CM

## 2024-03-08 ENCOUNTER — Other Ambulatory Visit: Payer: Self-pay

## 2024-03-08 DIAGNOSIS — D3A8 Other benign neuroendocrine tumors: Secondary | ICD-10-CM

## 2024-03-08 DIAGNOSIS — K869 Disease of pancreas, unspecified: Secondary | ICD-10-CM

## 2024-03-11 ENCOUNTER — Inpatient Hospital Stay: Admission: RE | Admit: 2024-03-11 | Source: Ambulatory Visit

## 2024-03-12 ENCOUNTER — Inpatient Hospital Stay

## 2024-03-12 ENCOUNTER — Encounter: Payer: Self-pay | Admitting: Nurse Practitioner

## 2024-03-12 ENCOUNTER — Inpatient Hospital Stay: Admitting: Nurse Practitioner

## 2024-03-12 VITALS — BP 113/60 | HR 81 | Temp 98.1°F | Resp 17 | Wt 175.3 lb

## 2024-03-12 DIAGNOSIS — D3A8 Other benign neuroendocrine tumors: Secondary | ICD-10-CM

## 2024-03-12 DIAGNOSIS — K869 Disease of pancreas, unspecified: Secondary | ICD-10-CM

## 2024-03-12 LAB — CBC WITH DIFFERENTIAL (CANCER CENTER ONLY)
Abs Immature Granulocytes: 0 10*3/uL (ref 0.00–0.07)
Basophils Absolute: 0 10*3/uL (ref 0.0–0.1)
Basophils Relative: 0 %
Eosinophils Absolute: 0.1 10*3/uL (ref 0.0–0.5)
Eosinophils Relative: 1 %
HCT: 45.3 % (ref 39.0–52.0)
Hemoglobin: 16 g/dL (ref 13.0–17.0)
Immature Granulocytes: 0 %
Lymphocytes Relative: 28 %
Lymphs Abs: 1.5 10*3/uL (ref 0.7–4.0)
MCH: 32.5 pg (ref 26.0–34.0)
MCHC: 35.3 g/dL (ref 30.0–36.0)
MCV: 91.9 fL (ref 80.0–100.0)
Monocytes Absolute: 0.4 10*3/uL (ref 0.1–1.0)
Monocytes Relative: 8 %
Neutro Abs: 3.3 10*3/uL (ref 1.7–7.7)
Neutrophils Relative %: 63 %
Platelet Count: 185 10*3/uL (ref 150–400)
RBC: 4.93 MIL/uL (ref 4.22–5.81)
RDW: 13.3 % (ref 11.5–15.5)
WBC Count: 5.3 10*3/uL (ref 4.0–10.5)
nRBC: 0 % (ref 0.0–0.2)

## 2024-03-12 LAB — CMP (CANCER CENTER ONLY)
ALT: 14 U/L (ref 0–44)
AST: 24 U/L (ref 15–41)
Albumin: 4.4 g/dL (ref 3.5–5.0)
Alkaline Phosphatase: 53 U/L (ref 38–126)
Anion gap: 9 (ref 5–15)
BUN: 15 mg/dL (ref 8–23)
CO2: 27 mmol/L (ref 22–32)
Calcium: 9.9 mg/dL (ref 8.9–10.3)
Chloride: 104 mmol/L (ref 98–111)
Creatinine: 1.43 mg/dL — ABNORMAL HIGH (ref 0.61–1.24)
GFR, Estimated: 50 mL/min — ABNORMAL LOW
Glucose, Bld: 110 mg/dL — ABNORMAL HIGH (ref 70–99)
Potassium: 4.5 mmol/L (ref 3.5–5.1)
Sodium: 140 mmol/L (ref 135–145)
Total Bilirubin: 0.6 mg/dL (ref 0.0–1.2)
Total Protein: 6.8 g/dL (ref 6.5–8.1)

## 2024-03-12 NOTE — Progress Notes (Signed)
 "     [facility]   [phone] [phone]    Patient Care Team: Joshua Debby CROME, MD as PCP - General (Internal Medicine) Jason Charleston, MD (Inactive) as Consulting Physician (Radiation Oncology) Octavia Charleston, MD as Consulting Physician (Ophthalmology) Douglass Lunger, MD as Consulting Physician (Nephrology) Mansouraty, Aloha Raddle., MD as Consulting Physician (Gastroenterology)   CHIEF COMPLAINT: Follow-up neuroendocrine tumor  CURRENT THERAPY: Surveillance  INTERVAL HISTORY Juan Torres returns for follow-up, last seen by Dr. Lanny 09/21/2023 and more recently by Dr. Dasie 02/2024. He feels much better overall practically a new person. Quit drinking. Energy and appetite normalized. He shoveled snow and went sledding this weekend. Only had 1 vomiting episode in past 6 months, managed with phenergan  suppository. Denies cramping, diarrhea, or flushing.   ROS  All other systems reviewed and negative   Past Medical History:  Diagnosis Date   Adenomatous colon polyp    Anxiety    Arthritis    FEET   COPD (chronic obstructive pulmonary disease) (HCC)    Depression    Familial tremor    GERD (gastroesophageal reflux disease)    Headache    When younger   History of basal cell cancer 2012   Hyperlipidemia    Hypertension    Nausea vomiting and diarrhea 09/06/2023   Prediabetes    Prostate cancer (HCC) DX 12/19/11   bx=Adenocarcinoma,gleason=3+3=6,volume=42cc,PSA=4.63   PTSD (post-traumatic stress disorder)    Pulmonary nodules 12/10/2018   Numerous unchanged/benign appearing per CT lung 12/2018   Sleep apnea    no cpap   Vitamin D  deficiency      Past Surgical History:  Procedure Laterality Date   COLONOSCOPY W/ POLYPECTOMY     Adenomatous   CYSTOSCOPY N/A 04/04/2012   Procedure: CYSTOSCOPY;  Surgeon: Toribio Neysa Repine, MD;  Location: [facility];  Service: Urology;  Laterality: N/A;   ESOPHAGOGASTRODUODENOSCOPY N/A 09/04/2023    Procedure: EGD (ESOPHAGOGASTRODUODENOSCOPY);  Surgeon: Wilhelmenia Aloha Raddle., MD;  Location: [facility];  Service: Gastroenterology;  Laterality: N/A;   EUS N/A 09/04/2023   Procedure: ULTRASOUND, UPPER GI TRACT, ENDOSCOPIC;  Surgeon: Wilhelmenia Aloha Raddle., MD;  Location: [facility];  Service: Gastroenterology;  Laterality: N/A;   EYE SURGERY     bilateral cataracts    FINE NEEDLE ASPIRATION  09/04/2023   Procedure: FINE NEEDLE ASPIRATION;  Surgeon: Wilhelmenia Aloha Raddle., MD;  Location: [facility];  Service: Gastroenterology;;   INGUINAL HERNIA REPAIR Bilateral 10/18/2018   Procedure: LAPAROSCOPIC BILATERAL INGUINAL HERNIA REPAIR;  Surgeon: Lunger Cough, MD;  Location: [facility];  Service: General;  Laterality: Bilateral;   INGUINAL HERNIA REPAIR Right 04/25/2023   Procedure: OPEN RIGHT INGUINAL HERNIA REPAIR WITH MESH;  Surgeon: Signe Mitzie LABOR, MD;  Location: [facility];  Service: General;  Laterality: Right;   PROSTATE BIOPSY  12/19/2011   Procedure: BIOPSY TRANSRECTAL ULTRASONIC PROSTATE (TUBP);  Surgeon: Toribio Neysa Repine, MD;  Location: [facility];  Service: Urology;  Laterality: N/A;     RADIOACTIVE SEED IMPLANT N/A 04/04/2012   Procedure: RADIOACTIVE SEED IMPLANT;  Surgeon: Toribio Neysa Repine, MD;  Location: [facility];  Service: Urology;  Laterality: N/A;  Seeds Implanted     72  Seeds Found in Bladder     None      Outpatient Encounter Medications as of 03/12/2024  Medication Sig Note   acetaminophen  (TYLENOL ) 325 MG tablet Take 2 tablets (650 mg total) by mouth every 6 (six) hours as needed for  mild pain (pain score 1-3) or fever (or Fever >/= 101). 10/15/2023: Has at home - hasn't used in a while.   Cholecalciferol  (VITAMIN D3) 125 MCG (5000 UT) TABS Take 5,000 Units by mouth every morning.    cyanocobalamin  (VITAMIN B12) 500 MCG tablet Take 1 tablet (500 mcg total) by mouth daily.    ezetimibe  (ZETIA ) 10 MG tablet Take 1 tablet (10 mg  total) by mouth daily for cholesterol.    folic acid  (FOLVITE ) 1 MG tablet Take 1 tablet (1 mg total) by mouth daily.    magnesium  oxide (MAG-OX) 400 (240 Mg) MG tablet Take 1 tablet (400 mg total) by mouth every evening.    metoprolol  tartrate (LOPRESSOR ) 25 MG tablet Take 1 tablet (25 mg total) by mouth 2 (two) times daily.    Multiple Vitamin (MULTIVITAMIN WITH MINERALS) TABS tablet Take 1 tablet by mouth daily.    Omega-3 Fatty Acids (FISH OIL) 1000 MG CAPS Take 1,000 mg by mouth every morning.    ondansetron  (ZOFRAN -ODT) 4 MG disintegrating tablet Take 1 tablet (4 mg total) by mouth every 8 (eight) hours as needed for nausea or vomiting.    pantoprazole  (PROTONIX ) 40 MG tablet Take 1 tablet (40 mg total) by mouth 2 (two) times daily.    promethazine  (PHENERGAN ) 25 MG suppository Place 1 suppository (25 mg total) rectally every 8 (eight) hours as needed for refractory nausea / vomiting.    rosuvastatin  (CRESTOR ) 40 MG tablet Take 1 tablet (40 mg total) by mouth daily for cholesterol    thiamine  (VITAMIN B-1) 100 MG tablet Take 1 tablet (100 mg total) by mouth daily.    LORazepam  (ATIVAN ) 1 MG tablet Take 0.5 tablets (0.5 mg total) by mouth 2 (two) times daily as needed for up to 4 doses (nausea). (Patient not taking: Reported on 03/12/2024)    [DISCONTINUED] famotidine  (PEPCID ) 40 MG tablet Take 1 tablet (40 mg total) by mouth at bedtime. (Patient not taking: Reported on 02/13/2024)    No facility-administered encounter medications on file as of 03/12/2024.     Today's Vitals   03/12/24 1127 03/12/24 1128  BP: 113/60   Pulse: 81   Resp: 17   Temp: 98.1 F (36.7 C)   SpO2: 96%   Weight: 175 lb 4.8 oz (79.5 kg)   PainSc:  0-No pain   Body mass index is 25.15 kg/m.   ECOG PERFORMANCE STATUS: 0 - Asymptomatic  PHYSICAL EXAM GENERAL:alert, no distress and comfortable SKIN: no rash  EYES: sclera clear NECK: without mass LYMPH:  no palpable cervical or supraclavicular lymphadenopathy   LUNGS: clear with normal breathing effort HEART: regular rate & rhythm, no lower extremity edema ABDOMEN: abdomen soft, non-tender and normal bowel sounds NEURO: alert & oriented x 3 with fluent speech, no focal motor/sensory deficits   CBC    Latest Ref Rng & Units 03/12/2024   11:03 AM 11/21/2023    3:40 PM 10/15/2023    4:49 AM  CBC  WBC 4.0 - 10.5 K/uL 5.3  6.2  7.0   Hemoglobin 13.0 - 17.0 g/dL 83.9  85.9  86.6   Hematocrit 39.0 - 52.0 % 45.3  42.2  37.7   Platelets 150 - 400 K/uL 185  239.0  211       CMP     Latest Ref Rng & Units 03/12/2024   11:03 AM 11/21/2023    3:40 PM 10/15/2023    4:49 AM  CMP  Glucose 70 - 99 mg/dL 889  92  85   BUN 8 - 23 mg/dL 15  11  20    Creatinine 0.61 - 1.24 mg/dL 8.56  8.76  8.85   Sodium 135 - 145 mmol/L 140  142  141   Potassium 3.5 - 5.1 mmol/L 4.5  3.8  3.4   Chloride 98 - 111 mmol/L 104  105  104   CO2 22 - 32 mmol/L 27  30  24    Calcium  8.9 - 10.3 mg/dL 9.9  9.9  9.5   Total Protein 6.5 - 8.1 g/dL 6.8     Total Bilirubin 0.0 - 1.2 mg/dL 0.6     Alkaline Phos 38 - 126 U/L 53     AST 15 - 41 U/L 24     ALT 0 - 44 U/L 14         ASSESSMENT & PLAN:  Neuroendocrine tumor of pancreatic head, well-differentiated grade 1, cT2N0M) stage II, Ki67 <1%  -Has been present since at least 2018 and measures approximately 2.6 cm in 2018, and now 3.2 cm in 2025.  No nodal or distant metastasis on the scan. -Per Dr. Dasie, [facility] for the size is surgical resection, but given the risk/benefit analysis of whipple, Dr. Dasie recommended surveillance  -Mr. 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 We are not recommending treatment at this time.  -We reviewed s/sx to monitor such as cramping, diarrhea, flushing -Will follow results of next week's surveillance scan and see him back in 4 months. -He will hold PPI 2 weeks prior to next lab so we can get an accurate chromogranin A    PLAN: -Continue  surveillance  -Pancreatic protocol CT 03/18/24 (ordered by Dr. Dasie), will follow results -Lab and f/up in 4 months, or sooner if needed   Orders Placed This Encounter  Procedures   Chromogranin A    Standing Status:   Standing    Number of Occurrences:   1    Expiration Date:   03/12/2025      All questions were answered. The patient knows to call the clinic with any problems, questions or concerns. No barriers to learning were detected.   Tron Flythe K Larayah Clute, NP 03/12/2024  "

## 2024-03-18 ENCOUNTER — Inpatient Hospital Stay: Admission: RE | Admit: 2024-03-18

## 2024-04-09 ENCOUNTER — Ambulatory Visit: Admitting: Gastroenterology

## 2024-04-10 ENCOUNTER — Ambulatory Visit

## 2024-07-09 ENCOUNTER — Inpatient Hospital Stay: Admitting: Nurse Practitioner

## 2024-07-09 ENCOUNTER — Inpatient Hospital Stay

## 2024-07-09 ENCOUNTER — Inpatient Hospital Stay: Admitting: Hematology

## 2024-08-08 ENCOUNTER — Ambulatory Visit
